# Patient Record
Sex: Female | Born: 1960 | Race: White | Hispanic: No | Marital: Married | Smoking: Former smoker
Health system: Southern US, Community
[De-identification: ages and names within clinical notes are randomized; demographics above are authoritative.]

## PROBLEM LIST (undated history)

## (undated) DIAGNOSIS — K7460 Unspecified cirrhosis of liver: Secondary | ICD-10-CM

## (undated) DIAGNOSIS — F419 Anxiety disorder, unspecified: Secondary | ICD-10-CM

## (undated) DIAGNOSIS — E079 Disorder of thyroid, unspecified: Secondary | ICD-10-CM

## (undated) DIAGNOSIS — I509 Heart failure, unspecified: Secondary | ICD-10-CM

## (undated) DIAGNOSIS — R609 Edema, unspecified: Secondary | ICD-10-CM

## (undated) DIAGNOSIS — R011 Cardiac murmur, unspecified: Secondary | ICD-10-CM

## (undated) DIAGNOSIS — I350 Nonrheumatic aortic (valve) stenosis: Secondary | ICD-10-CM

## (undated) DIAGNOSIS — I10 Essential (primary) hypertension: Secondary | ICD-10-CM

## (undated) DIAGNOSIS — D649 Anemia, unspecified: Secondary | ICD-10-CM

## (undated) DIAGNOSIS — G4730 Sleep apnea, unspecified: Secondary | ICD-10-CM

## (undated) HISTORY — DX: Heart failure, unspecified: I50.9

## (undated) HISTORY — PX: BREAST BIOPSY: SHX20

## (undated) HISTORY — DX: Cardiac murmur, unspecified: R01.1

## (undated) HISTORY — DX: Disorder of thyroid, unspecified: E07.9

## (undated) HISTORY — PX: TUBAL LIGATION: SHX77

## (undated) HISTORY — PX: DILATION AND CURETTAGE OF UTERUS: SHX78

## (undated) HISTORY — DX: Nonrheumatic aortic (valve) stenosis: I35.0

## (undated) HISTORY — DX: Edema, unspecified: R60.9

## (undated) HISTORY — DX: Anemia, unspecified: D64.9

## (undated) HISTORY — DX: Essential (primary) hypertension: I10

## (undated) HISTORY — DX: Unspecified cirrhosis of liver: K74.60

## (undated) HISTORY — PX: ELBOW SURGERY: SHX618

---

## 1993-12-17 HISTORY — PX: POLYPECTOMY: SHX149

## 2001-12-17 HISTORY — PX: CHOLECYSTECTOMY: SHX55

## 2006-07-05 DIAGNOSIS — F411 Generalized anxiety disorder: Secondary | ICD-10-CM | POA: Insufficient documentation

## 2006-07-05 DIAGNOSIS — I10 Essential (primary) hypertension: Secondary | ICD-10-CM | POA: Insufficient documentation

## 2007-11-26 ENCOUNTER — Emergency Department: Payer: Self-pay | Admitting: Emergency Medicine

## 2008-02-03 ENCOUNTER — Ambulatory Visit: Payer: Self-pay | Admitting: Family Medicine

## 2008-06-24 ENCOUNTER — Ambulatory Visit: Payer: Self-pay | Admitting: Family Medicine

## 2010-01-02 ENCOUNTER — Emergency Department: Payer: Self-pay | Admitting: Emergency Medicine

## 2010-12-02 ENCOUNTER — Ambulatory Visit: Payer: Self-pay | Admitting: Family Medicine

## 2010-12-29 ENCOUNTER — Ambulatory Visit: Payer: Self-pay | Admitting: Family Medicine

## 2011-02-23 ENCOUNTER — Ambulatory Visit: Payer: Self-pay

## 2011-03-27 ENCOUNTER — Encounter: Payer: Self-pay | Admitting: Cardiovascular Disease

## 2011-03-27 ENCOUNTER — Ambulatory Visit (INDEPENDENT_AMBULATORY_CARE_PROVIDER_SITE_OTHER): Payer: Managed Care, Other (non HMO) | Admitting: Cardiovascular Disease

## 2011-03-27 DIAGNOSIS — R0602 Shortness of breath: Secondary | ICD-10-CM | POA: Insufficient documentation

## 2011-03-27 DIAGNOSIS — R609 Edema, unspecified: Secondary | ICD-10-CM | POA: Insufficient documentation

## 2011-03-27 DIAGNOSIS — E119 Type 2 diabetes mellitus without complications: Secondary | ICD-10-CM

## 2011-03-27 DIAGNOSIS — I10 Essential (primary) hypertension: Secondary | ICD-10-CM | POA: Insufficient documentation

## 2011-03-27 MED ORDER — LISINOPRIL-HYDROCHLOROTHIAZIDE 20-12.5 MG PO TABS: 2.0000 | ORAL_TABLET | Freq: Every day | ORAL | Status: DC

## 2011-03-27 MED ORDER — METOPROLOL TARTRATE 25 MG PO TABS: 25.0000 mg | ORAL_TABLET | Freq: Two times a day (BID) | ORAL | Status: DC

## 2011-03-27 NOTE — Assessment & Plan Note (Signed)
As we will stop her amlodipine, I suggested that we increase her lisinopril to 40/25 mg daily. We will also start metoprolol tartrate 25 mg b.i.d.. Ideally I would've like to try bystolic though this can sometimes cause edema and fluid retention. If her pressure continues to be elevated, we could try additional medications such as clonidine, hydralazine or nitrates.

## 2011-03-27 NOTE — Patient Instructions (Signed)
STOP Amlodipine. START Metoprolol Tartrate 25 mg twice daily. Increase Lisinopril/HCT to 40/25mg  once daily. (You can take 2 tablets of what you have now, but if you take Lasix, only take one tablet).  Your physician has requested that you have an echocardiogram. Echocardiography is a painless test that uses sound waves to create images of your heart. It provides your doctor with information about the size and shape of your heart and how well your heart's chambers and valves are working. This procedure takes approximately one hour. There are no restrictions for this procedure.  Your physician recommends that you schedule a follow-up appointment in: 5 weeks

## 2011-03-27 NOTE — Progress Notes (Signed)
Patient ID: Emily Tapia, female    DOB: Jul 12, 1961, 50 y.o.   MRN: [id number]  HPI Comments: [name] is a very pleasant 50 year old woman who presents by her fall from Dr. Aldine Contes for evaluation of murmur, shortness of breath and edema.  She reports that she has had a long history of shortness of breath for 1-1/2 years. She reports that it came on quickly. She has started Lasix in the past month and has had a mild weight loss with improvement of her shortness of breath. She reports that the murmur that she has has been there for several years though reports that it might have been getting louder recently.   She started having lower extremity swelling about one year ago. It has been worse over the past 6 months. When she takes Lasix, this seems to help to a mild degree. Her feet are very sensitive to touch bilaterally. She sits most of the day but has to put her feet up on a box for relief. She denies any significant chest discomfort. She does not do any regular exercise was able to ablate without any significant difficulty and perform all of her ADLs.  She reports that her blood pressure has been elevated at home with systolic pressures in the 150 range. She does have a elevated heart rate at baseline and also has noticed palpitations.  EKG shows normal sinus rhythm with rate 86 beats per minute with no significant ST or T wave changes     Review of Systems  Constitutional: Negative.   HENT: Negative.   Eyes: Negative.   Respiratory: Positive for shortness of breath.   Cardiovascular: Positive for palpitations and leg swelling.  Gastrointestinal: Negative.   Musculoskeletal: Negative.   Skin: Negative.   Neurological: Negative.   Hematological: Negative.   Psychiatric/Behavioral: Negative.   All other systems reviewed and are negative.   BP 144/68  Pulse 86  Ht 5\' 4"  (1.626 m)  Wt 224 lb 1.9 oz (101.66 kg)  BMI 38.47 kg/m2  Physical Exam  Nursing note and  vitals reviewed. Constitutional: She is oriented to person, place, and time. She appears well-developed and well-nourished.       Obese.  HENT:  Head: Normocephalic.  Nose: Nose normal.  Mouth/Throat: Oropharynx is clear and moist.  Eyes: Conjunctivae are normal. Pupils are equal, round, and reactive to light.  Neck: Normal range of motion. Neck supple. No JVD present.  Cardiovascular: Normal rate, regular rhythm, S1 normal, S2 normal and intact distal pulses.  Exam reveals no gallop and no friction rub.   Murmur heard.  Crescendo systolic murmur is present with a grade of 3/6  Pulses:      Carotid pulses are 1+ on the right side with bruit, and 1+ on the left side with bruit. Pulmonary/Chest: Effort normal and breath sounds normal. No respiratory distress. She has no wheezes. She has no rales. She exhibits no tenderness.  Abdominal: Soft. Bowel sounds are normal. She exhibits no distension. There is no tenderness.  Musculoskeletal: Normal range of motion. She exhibits edema. She exhibits no tenderness.       Edema of the legs is nonpitting, trace to the ankles.  Lymphadenopathy:    She has no cervical adenopathy.  Neurological: She is alert and oriented to person, place, and time. Coordination normal.  Skin: Skin is warm and dry. No rash noted. No erythema.  Psychiatric: She has a normal mood and affect. Her behavior is normal. Judgment and thought  content normal.         Assessment and Plan

## 2011-03-27 NOTE — Assessment & Plan Note (Signed)
I am concerned that her edema could be an exacerbation of the amlodipine side effect and underlying venous insufficiency. I suggested that she stop her amlodipine for now.

## 2011-03-27 NOTE — Assessment & Plan Note (Signed)
I suspect her shortness of breath could be secondary to underlying valvular disease. Her murmur does radiate and difficult to identify. We have ordered an echocardiogram given her symptoms of shortness of breath and murmur.

## 2011-03-27 NOTE — Assessment & Plan Note (Signed)
We have encouraged her to continue to work on her diabetes, weight loss and watching her diet with  increased exercise as tolerated

## 2011-04-05 ENCOUNTER — Ambulatory Visit (INDEPENDENT_AMBULATORY_CARE_PROVIDER_SITE_OTHER): Payer: Managed Care, Other (non HMO) | Admitting: *Deleted

## 2011-04-05 ENCOUNTER — Other Ambulatory Visit: Payer: Self-pay | Admitting: *Deleted

## 2011-04-05 ENCOUNTER — Other Ambulatory Visit: Payer: Self-pay | Admitting: Cardiovascular Disease

## 2011-04-05 DIAGNOSIS — R079 Chest pain, unspecified: Secondary | ICD-10-CM

## 2011-04-05 DIAGNOSIS — R609 Edema, unspecified: Secondary | ICD-10-CM

## 2011-04-05 DIAGNOSIS — R011 Cardiac murmur, unspecified: Secondary | ICD-10-CM

## 2011-04-05 DIAGNOSIS — R0602 Shortness of breath: Secondary | ICD-10-CM

## 2011-04-10 ENCOUNTER — Encounter: Payer: Self-pay | Admitting: Cardiovascular Disease

## 2011-05-01 ENCOUNTER — Ambulatory Visit (INDEPENDENT_AMBULATORY_CARE_PROVIDER_SITE_OTHER): Payer: Managed Care, Other (non HMO) | Admitting: Cardiovascular Disease

## 2011-05-01 ENCOUNTER — Encounter: Payer: Self-pay | Admitting: Cardiovascular Disease

## 2011-05-01 DIAGNOSIS — I359 Nonrheumatic aortic valve disorder, unspecified: Secondary | ICD-10-CM

## 2011-05-01 DIAGNOSIS — I10 Essential (primary) hypertension: Secondary | ICD-10-CM

## 2011-05-01 DIAGNOSIS — R609 Edema, unspecified: Secondary | ICD-10-CM

## 2011-05-01 DIAGNOSIS — R0602 Shortness of breath: Secondary | ICD-10-CM

## 2011-05-01 DIAGNOSIS — I350 Nonrheumatic aortic (valve) stenosis: Secondary | ICD-10-CM

## 2011-05-01 NOTE — Assessment & Plan Note (Signed)
Echocardiogram is normal. I asked her to continue to use her Lasix p.r.n. Increase her exercise and watch her weight.

## 2011-05-01 NOTE — Progress Notes (Signed)
   Patient ID: Emily Tapia, female    DOB: 1961/09/03, 50 y.o.   MRN: [id number]  HPI Comments: [name] is a very pleasant 50 year old woman, patient of  Aldine Contes for evaluation of murmur, shortness of breath and edema.  She reports that her breathing has been stable. On her last visit, he started metoprolol for tachycardia, held her amlodipine and started an ACE inhibitor. Her blood pressure has been improved. She does take a Lasix as needed and has improved lower extremity, overall she has no complaints.  The echocardiogram showed normal systolic function, mild LVH, with borderline to mild aortic valve stenosis with normal right ventricular systolic pressures  EKG shows normal sinus rhythm with rate 72 beats per minute with no significant ST or T wave changes     Review of Systems  Constitutional: Negative.   HENT: Negative.   Eyes: Negative.   Respiratory: Negative.   Cardiovascular: Negative.   Gastrointestinal: Negative.   Musculoskeletal: Negative.   Skin: Negative.   Neurological: Negative.   Hematological: Negative.   Psychiatric/Behavioral: Negative.   All other systems reviewed and are negative.    BP 134/64  Pulse 73  Ht 5\' 3"  (1.6 m)  Wt 224 lb (101.606 kg)  BMI 39.68 kg/m2   Physical Exam  Nursing note and vitals reviewed. Constitutional: She is oriented to person, place, and time. She appears well-developed and well-nourished.  HENT:  Head: Normocephalic.  Nose: Nose normal.  Mouth/Throat: Oropharynx is clear and moist.  Eyes: Conjunctivae are normal. Pupils are equal, round, and reactive to light.  Neck: Normal range of motion. Neck supple. No JVD present.  Cardiovascular: Normal rate, regular rhythm, S1 normal, S2 normal and intact distal pulses.  Exam reveals no gallop and no friction rub.   Murmur heard.  Systolic murmur is present with a grade of 2/6  Pulmonary/Chest: Effort normal and breath sounds normal. No respiratory  distress. She has no wheezes. She has no rales. She exhibits no tenderness.  Abdominal: Soft. Bowel sounds are normal. She exhibits no distension. There is no tenderness.  Musculoskeletal: Normal range of motion. She exhibits no edema and no tenderness.  Lymphadenopathy:    She has no cervical adenopathy.  Neurological: She is alert and oriented to person, place, and time. Coordination normal.  Skin: Skin is warm and dry. No rash noted. No erythema.  Psychiatric: She has a normal mood and affect. Her behavior is normal. Judgment and thought content normal.         Assessment and Plan

## 2011-05-01 NOTE — Assessment & Plan Note (Signed)
Edema has improved by holding amlodipine and with p.r.n. Lasix. We have asked her to wear tight compression hose if her symptoms get worse. Echocardiogram does not suggest fluid overload.

## 2011-05-01 NOTE — Patient Instructions (Signed)
You are doing well. No medication changes were made. Please call us if you have new issues that need to be addressed before your next appt.  We will call you for a follow up Appt. 1 year

## 2011-05-01 NOTE — Assessment & Plan Note (Signed)
Blood pressure is well controlled on today's visit. No changes made to the medications. 

## 2011-05-01 NOTE — Assessment & Plan Note (Signed)
She does have very mild aortic valve stenosis. This can be managed medically. No followup echocardiogram need for several years.

## 2013-10-25 DIAGNOSIS — D259 Leiomyoma of uterus, unspecified: Secondary | ICD-10-CM | POA: Insufficient documentation

## 2013-11-17 HISTORY — PX: ABDOMINAL HYSTERECTOMY: SHX81

## 2014-07-23 ENCOUNTER — Ambulatory Visit: Payer: Self-pay | Admitting: Family Medicine

## 2014-09-03 LAB — HEPATIC FUNCTION PANEL
ALK PHOS: 71 U/L (ref 25–125)
ALT: 53 U/L — AB (ref 7–35)
AST: 41 U/L — AB (ref 13–35)
BILIRUBIN, TOTAL: 0.4 mg/dL

## 2014-09-03 LAB — CBC AND DIFFERENTIAL
HEMATOCRIT: 36 % (ref 36–46)
Hemoglobin: 11.9 g/dL — AB (ref 12.0–16.0)
NEUTROS ABS: 63 /uL
PLATELETS: 202 10*3/uL (ref 150–399)
WBC: 6.5 10^3/mL

## 2014-09-03 LAB — BASIC METABOLIC PANEL
BUN: 14 mg/dL (ref 4–21)
CREATININE: 0.6 mg/dL (ref 0.5–1.1)
Glucose: 93 mg/dL

## 2014-09-03 LAB — LIPID PANEL
Cholesterol: 181 mg/dL (ref 0–200)
HDL: 36 mg/dL (ref 35–70)
LDL CALC: 101 mg/dL
Triglycerides: 221 mg/dL — AB (ref 40–160)

## 2014-09-03 LAB — HEMOGLOBIN A1C: Hgb A1c MFr Bld: 6.2 % — AB (ref 4.0–6.0)

## 2014-09-03 LAB — TSH: TSH: 3.35 u[IU]/mL (ref 0.41–5.90)

## 2015-04-27 DIAGNOSIS — R609 Edema, unspecified: Secondary | ICD-10-CM | POA: Insufficient documentation

## 2015-04-27 DIAGNOSIS — E039 Hypothyroidism, unspecified: Secondary | ICD-10-CM | POA: Insufficient documentation

## 2015-04-27 DIAGNOSIS — M25519 Pain in unspecified shoulder: Secondary | ICD-10-CM | POA: Insufficient documentation

## 2015-04-27 DIAGNOSIS — G4733 Obstructive sleep apnea (adult) (pediatric): Secondary | ICD-10-CM | POA: Insufficient documentation

## 2015-04-27 DIAGNOSIS — D649 Anemia, unspecified: Secondary | ICD-10-CM | POA: Insufficient documentation

## 2015-04-27 DIAGNOSIS — R011 Cardiac murmur, unspecified: Secondary | ICD-10-CM | POA: Insufficient documentation

## 2015-04-27 DIAGNOSIS — Z332 Encounter for elective termination of pregnancy: Secondary | ICD-10-CM | POA: Insufficient documentation

## 2015-04-27 DIAGNOSIS — E876 Hypokalemia: Secondary | ICD-10-CM | POA: Insufficient documentation

## 2015-04-27 DIAGNOSIS — R0602 Shortness of breath: Secondary | ICD-10-CM | POA: Insufficient documentation

## 2015-04-27 DIAGNOSIS — G64 Other disorders of peripheral nervous system: Secondary | ICD-10-CM | POA: Insufficient documentation

## 2015-04-27 DIAGNOSIS — E119 Type 2 diabetes mellitus without complications: Secondary | ICD-10-CM | POA: Insufficient documentation

## 2015-04-27 DIAGNOSIS — E1142 Type 2 diabetes mellitus with diabetic polyneuropathy: Secondary | ICD-10-CM | POA: Insufficient documentation

## 2015-04-27 DIAGNOSIS — E785 Hyperlipidemia, unspecified: Secondary | ICD-10-CM | POA: Insufficient documentation

## 2015-06-07 ENCOUNTER — Other Ambulatory Visit: Payer: Self-pay

## 2015-06-07 MED ORDER — LEVOTHYROXINE SODIUM 50 MCG PO TABS: 50.0000 ug | ORAL_TABLET | Freq: Every day | ORAL | Status: DC

## 2015-06-07 NOTE — Telephone Encounter (Signed)
Prescription sent to [facility] on [address]. Should schedule follow up appointment.

## 2015-06-07 NOTE — Telephone Encounter (Signed)
Patient is requesting a refill of Levothyroxine 50 mcg be sent to [facility] on [address]

## 2015-06-08 NOTE — Telephone Encounter (Signed)
Left patient a voicemail informing her that the RX has been sent to the pharmacy and to call back to schedule a follow up appointment.

## 2015-07-08 ENCOUNTER — Encounter: Payer: Self-pay | Admitting: Family Medicine

## 2015-07-08 ENCOUNTER — Ambulatory Visit (INDEPENDENT_AMBULATORY_CARE_PROVIDER_SITE_OTHER): Payer: Managed Care, Other (non HMO) | Admitting: Family Medicine

## 2015-07-08 VITALS — BP 132/84 | HR 68 | Temp 98.8°F | Resp 18 | Ht 64.0 in | Wt 212.0 lb

## 2015-07-08 DIAGNOSIS — R1900 Intra-abdominal and pelvic swelling, mass and lump, unspecified site: Secondary | ICD-10-CM | POA: Insufficient documentation

## 2015-07-08 DIAGNOSIS — E1142 Type 2 diabetes mellitus with diabetic polyneuropathy: Secondary | ICD-10-CM | POA: Diagnosis not present

## 2015-07-08 DIAGNOSIS — I10 Essential (primary) hypertension: Secondary | ICD-10-CM | POA: Diagnosis not present

## 2015-07-08 DIAGNOSIS — I350 Nonrheumatic aortic (valve) stenosis: Secondary | ICD-10-CM | POA: Diagnosis not present

## 2015-07-08 DIAGNOSIS — G629 Polyneuropathy, unspecified: Secondary | ICD-10-CM | POA: Diagnosis not present

## 2015-07-08 DIAGNOSIS — E782 Mixed hyperlipidemia: Secondary | ICD-10-CM | POA: Diagnosis not present

## 2015-07-08 DIAGNOSIS — Z8639 Personal history of other endocrine, nutritional and metabolic disease: Secondary | ICD-10-CM | POA: Insufficient documentation

## 2015-07-08 DIAGNOSIS — E039 Hypothyroidism, unspecified: Secondary | ICD-10-CM | POA: Diagnosis not present

## 2015-07-08 MED ORDER — PRAVASTATIN SODIUM 40 MG PO TABS: 40.0000 mg | ORAL_TABLET | Freq: Every day | ORAL | Status: DC

## 2015-07-08 NOTE — Progress Notes (Signed)
Patient ID: Emily Tapia, female   DOB: [birth year], 54 y.o.   MRN: [id number]       Patient: Emily Tapia Female    DOB: 11-28-1961   54 y.o.   MRN: [id number] Visit Date: 07/08/2015  Today's Provider: Vernie Murders, PA   Chief Complaint  Patient presents with  . Hypertension  . Diabetes  . Hypothyroidism  . Hyperlipidemia   Subjective:    HPI  Hypertension follow up:  Patient was last seen for a routine follow up on 09/03/2014. At that time B/P was controlled well. She is not checking her B/P. She denies having any cardiac issues.  Diabetes follow up:    Last A1C was 6.2 on 09/03/14-no changes were made at that time. She checks occasionally fasting around 118. No hypoglycemic. She does check her feet for any ulcers or wounds. Her last eye exam was over a year ago-she does not have on scheduled yet.  Hypothyroidism follow up:  Last check was on 09/03/14 and levels were stable.  Hyperlipidemia follow up:   Last check was on 09/03/14-triglycerides were elevated and slight liver changes. Advised to work on habits and follow up in 3 months. She does exercise- elliptical at work twice a day every day. She tries to watch her diet.  History of iron deficiency:  On her last visit routine labs were checked and MCV, MCH were slightly down on blood work.  Past Medical History  Diagnosis Date  . CHF (congestive heart failure)   . Edema   . Heart murmur   . Hypertension   . Thyroid disease   . Diabetes mellitus    Past Surgical History  Procedure Laterality Date  . Cesarean section      x 2  . Elbow surgery    . Breast biopsy      x2  . Dilation and curettage of uterus    . Abdominal hysterectomy  11/17/2013  . Cholecystectomy  2003    DUE TO STONES  . Polypectomy  1995   Family History  Problem Relation Age of Onset  . Hyperlipidemia Mother   . Thyroid disease Mother   . Hashimoto's thyroiditis Mother   . Heart disease Father     open heart surgery  .  Heart failure Father   . Diabetes Father   . Dementia Father   . Thyroid disease Brother   . Breast cancer Maternal Grandmother   . Diabetes Paternal Grandmother   . Prostate cancer Paternal Grandfather       No Known Allergies   Previous Medications   ASPIRIN 81 MG EC TABLET    Take 81 mg by mouth daily.     CHOLECALCIFEROL (VITAMIN D3) 1000 UNITS CAPS    Take 1 capsule by mouth daily.     CLONAZEPAM (KLONOPIN) 1 MG TABLET    Take 1 mg by mouth 2 (two) times daily as needed.     FERROUS SULFATE (IRON) 325 (65 FE) MG TABS    Take 1 tablet by mouth daily.     FISH OIL-OMEGA-3 FATTY ACIDS 1000 MG CAPSULE    Take 2 g by mouth daily.     FUROSEMIDE (LASIX) 40 MG TABLET    Take 40 mg by mouth daily.     GLYBURIDE (DIABETA) 5 MG TABLET    Take 5 mg by mouth 2 (two) times daily with a meal.     LEVOTHYROXINE (SYNTHROID, LEVOTHROID) 50 MCG TABLET    Take 1 tablet (  50 mcg total) by mouth daily.   LIRAGLUTIDE 18 MG/3ML SOPN    Inject 1.2 mg into the skin daily.   LISINOPRIL-HYDROCHLOROTHIAZIDE (ZESTORETIC) 20-12.5 MG PER TABLET    Take 2 tablets by mouth daily.   METFORMIN (GLUMETZA) 1000 MG (MOD) 24 HR TABLET    Take 1,000 mg by mouth 2 (two) times daily with a meal.     METOPROLOL TARTRATE (LOPRESSOR) 25 MG TABLET    Take 1 tablet (25 mg total) by mouth 2 (two) times daily.   MULTIPLE VITAMIN (MULTIVITAMIN) TABLET    Take 1 tablet by mouth daily.     PAROXETINE (PAXIL) 20 MG TABLET    Take 20 mg by mouth every morning.     PRAVASTATIN (PRAVACHOL) 40 MG TABLET    Take 40 mg by mouth daily.      Review of Systems  Constitutional: Positive for fatigue (about the same). Negative for chills, activity change and appetite change.  Respiratory: Positive for shortness of breath (at times when she works out) and wheezing. Negative for apnea, cough, choking and chest tightness.   Cardiovascular: Positive for palpitations (chronic not worse) and leg swelling (chronic). Negative for chest pain.    Gastrointestinal: Positive for diarrhea (stool incontinence lately).  Genitourinary: Positive for urgency (urinary incontinence present).  Musculoskeletal: Positive for joint swelling.  Neurological: Negative.        Tingling and pins&needles sensation dorsum of feet and toes unchanged    History  Substance Use Topics  . Smoking status: Former Smoker -- 1.50 packs/day for 25 years    Types: Cigarettes    Quit date: 11/24/2001  . Smokeless tobacco: Never Used  . Alcohol Use: 0.0 oz/week    0 Standard drinks or equivalent per week     Comment: OCCASIONALLY DRINKS WINE, ONCE A WEEK   Objective:   BP 132/84 mmHg  Pulse 68  Temp(Src) 98.8 F (37.1 C)  Resp 18  Ht 5\' 4"  (1.626 m)  Wt 212 lb (96.163 kg)  BMI 36.37 kg/m2  Physical Exam  Constitutional: She is oriented to person, place, and time. She appears well-developed and well-nourished.  HENT:  Head: Normocephalic and atraumatic.  Right Ear: External ear normal.  Left Ear: External ear normal.  Nose: Nose normal.  Mouth/Throat: Oropharynx is clear and moist.  Eyes: Conjunctivae and EOM are normal. Pupils are equal, round, and reactive to light.  Neck: Normal range of motion. Neck supple. No thyromegaly present.  Cardiovascular: Normal rate, regular rhythm and intact distal pulses.   Murmur heard.  Systolic murmur is present with a grade of 2/6  Right 2nd ICS at  [facility]  Pulmonary/Chest: Effort normal and breath sounds normal.  Abdominal: Soft. Bowel sounds are normal.  Musculoskeletal: Normal range of motion.  Neurological: She is alert and oriented to person, place, and time. She has normal reflexes.  Normal sensation of both feet to test with nylon string  Psychiatric: She has a normal mood and affect. Her behavior is normal. Thought content normal.      Assessment & Plan:     1. Essential hypertension Good control with regular use of Metoprolol 25 mg qd and Lisinopril-HCTZ 20-12.5 mg qd. Will get follow up labs and  continue present regimen. Recheck pending reports. - Comprehensive metabolic panel - CBC with Differential/Platelet  2. Diabetic peripheral neuropathy associated with type 2 diabetes mellitus Tolerating Metformin 1000 mg BID and Glyburide 5 mg BID with Victoza 1.2 mg injected daily. Blood sugar around 118  at home Hgb A1C 6.7 today. Good compliance. Still has some pins&needles sensation in the dorsum of her feet and toes. Normal nylon string test. Will get routine labs and microalbumin. Last eye exam more than a year ago and will schedule soon. Recheck in 3 months. - POCT HgB A1C - POCT UA - Microalbumin  3. Hypothyroidism, unspecified hypothyroidism type Still has some heat intolerance and taking Levothyroxine 50 mcg qd. Recheck CMP and TSH. Follow up pending reports - TSH - Comprehensive metabolic panel  4. Hyperlipidemia, mixed Good compliance with Fish Oil and Pravastatin 40 mg qd. Continue low fat diet and work on weight loss. Refilled Pravastatin and recheck pending lab reports. - Lipid panel - pravastatin (PRAVACHOL) 40 MG tablet; Take 1 tablet (40 mg total) by mouth daily.  Dispense: 30 tablet; Refill: 6  5. Hx of iron deficiency Due for follow up and continues to use Ferrous Sulfate 325 mg qd with Multivitamin. Recheck hgb and hct. - CBC with Differential/Platelet  6. Aortic valve stenosis Murmur unchanged. Still taking Metoprolol and Furosemide prn as recommended by her cardiologist (Dr. Rockey Situ). Follow up with him as planned.         Vernie Murders, PA  [facility]

## 2015-07-13 LAB — CBC WITH DIFFERENTIAL/PLATELET
BASOS ABS: 0 10*3/uL (ref 0.0–0.2)
Basos: 1 %
EOS (ABSOLUTE): 0.1 10*3/uL (ref 0.0–0.4)
EOS: 2 %
HEMOGLOBIN: 14 g/dL (ref 11.1–15.9)
Hematocrit: 39.2 % (ref 34.0–46.6)
Immature Grans (Abs): 0 10*3/uL (ref 0.0–0.1)
Immature Granulocytes: 0 %
Lymphocytes Absolute: 1.6 10*3/uL (ref 0.7–3.1)
Lymphs: 26 %
MCH: 27.8 pg (ref 26.6–33.0)
MCHC: 35.7 g/dL (ref 31.5–35.7)
MCV: 78 fL — AB (ref 79–97)
Monocytes Absolute: 0.4 10*3/uL (ref 0.1–0.9)
Monocytes: 6 %
NEUTROS PCT: 65 %
Neutrophils Absolute: 3.9 10*3/uL (ref 1.4–7.0)
Platelets: 167 10*3/uL (ref 150–379)
RBC: 5.04 x10E6/uL (ref 3.77–5.28)
RDW: 15.9 % — ABNORMAL HIGH (ref 12.3–15.4)
WBC: 6.1 10*3/uL (ref 3.4–10.8)

## 2015-07-13 LAB — COMPREHENSIVE METABOLIC PANEL
A/G RATIO: 1.7 (ref 1.1–2.5)
ALT: 74 IU/L — ABNORMAL HIGH (ref 0–32)
AST: 73 IU/L — AB (ref 0–40)
Albumin: 4.2 g/dL (ref 3.5–5.5)
Alkaline Phosphatase: 73 IU/L (ref 39–117)
BILIRUBIN TOTAL: 0.4 mg/dL (ref 0.0–1.2)
BUN / CREAT RATIO: 25 — AB (ref 9–23)
BUN: 15 mg/dL (ref 6–24)
CO2: 27 mmol/L (ref 18–29)
Calcium: 9.3 mg/dL (ref 8.7–10.2)
Chloride: 97 mmol/L (ref 97–108)
Creatinine, Ser: 0.59 mg/dL (ref 0.57–1.00)
GFR calc Af Amer: 119 mL/min/{1.73_m2} (ref >59)
GFR calc non Af Amer: 104 mL/min/{1.73_m2} (ref >59)
GLUCOSE: 211 mg/dL — AB (ref 65–99)
Globulin, Total: 2.5 g/dL (ref 1.5–4.5)
POTASSIUM: 4.5 mmol/L (ref 3.5–5.2)
Sodium: 141 mmol/L (ref 134–144)
TOTAL PROTEIN: 6.7 g/dL (ref 6.0–8.5)

## 2015-07-13 LAB — LIPID PANEL
Chol/HDL Ratio: 5.8 ratio units — ABNORMAL HIGH (ref 0.0–4.4)
Cholesterol, Total: 185 mg/dL (ref 100–199)
HDL: 32 mg/dL — AB (ref >39)
LDL CALC: 115 mg/dL — AB (ref 0–99)
TRIGLYCERIDES: 190 mg/dL — AB (ref 0–149)
VLDL Cholesterol Cal: 38 mg/dL (ref 5–40)

## 2015-07-13 LAB — TSH: TSH: 4.45 u[IU]/mL (ref 0.450–4.500)

## 2015-07-15 ENCOUNTER — Telehealth: Payer: Self-pay

## 2015-07-15 NOTE — Telephone Encounter (Signed)
-----   Message from Margo Common, [place] sent at 07/15/2015  1:55 PM EDT ----- Thyroid test normal on present dosage of the Levothyroxine. Blood sugar up to 211 and Hgb A1C 6.7. Continue present medications and diet with weight loss efforts. Cholesterol improved. Continue present Pravastatin 40 mg qd. Recheck in 3 months.

## 2015-07-15 NOTE — Telephone Encounter (Signed)
Patient advised as directed below. Patient verbalized understanding and agrees with treatment plan. 

## 2015-07-15 NOTE — Telephone Encounter (Signed)
LMTCB

## 2015-07-15 NOTE — Telephone Encounter (Signed)
Pt returned call. Thanks TNP °

## 2015-08-29 ENCOUNTER — Other Ambulatory Visit: Payer: Self-pay | Admitting: Family Medicine

## 2015-08-29 NOTE — Telephone Encounter (Signed)
Patient e-mail received, patient is requesting a refill on Paroxetine 20 mg. [place].

## 2015-10-12 ENCOUNTER — Telehealth: Payer: Self-pay

## 2015-10-12 NOTE — Telephone Encounter (Signed)
Patient wanted to know if the paperwork she dropped off on Monday is ready for pick up? Patient states she will need this done by Friday if possible. Please advise.

## 2015-10-13 NOTE — Telephone Encounter (Signed)
LMTCB

## 2015-10-13 NOTE — Telephone Encounter (Signed)
Pt is returning call.  CB#[phone]/MW °

## 2015-10-24 ENCOUNTER — Other Ambulatory Visit: Payer: Self-pay | Admitting: Family Medicine

## 2015-10-24 ENCOUNTER — Other Ambulatory Visit: Payer: Self-pay

## 2015-10-24 DIAGNOSIS — I10 Essential (primary) hypertension: Secondary | ICD-10-CM

## 2015-10-24 MED ORDER — METOPROLOL TARTRATE 25 MG PO TABS: 25.0000 mg | ORAL_TABLET | Freq: Two times a day (BID) | ORAL | Status: DC

## 2015-10-24 NOTE — Telephone Encounter (Signed)
Patient email received requesting a refill on Metoprolol 25 mg be sent to [place].

## 2015-10-25 NOTE — Telephone Encounter (Signed)
[facility] sent RX to pharmacy.  

## 2015-11-11 ENCOUNTER — Other Ambulatory Visit: Payer: Self-pay | Admitting: Family Medicine

## 2015-11-15 ENCOUNTER — Other Ambulatory Visit: Payer: Self-pay | Admitting: Family Medicine

## 2015-11-17 ENCOUNTER — Telehealth: Payer: Self-pay | Admitting: Family Medicine

## 2015-11-17 NOTE — Telephone Encounter (Signed)
Pt stated that she has been trying to get [facility] to send her Valentine 32 MG/3ML SOPN but they are requiring an MD's signature on this RX. Pt stated that [facility] advised her that they faxed our office on 11/16/15 but they haven't heard back. Pt stated she understands that there is a turn around time but she just wanted to make sure it is being worked on because she doesn't want to run out. Thanks TNP

## 2015-11-17 NOTE — Telephone Encounter (Signed)
RX was e scribed to [facility] on 11/28. Contacted [facility] pharmacist stated RX has been processed, and is about to be shipped to patient.  Left patient a voicemail advising her that RX has been sent.

## 2015-12-06 ENCOUNTER — Encounter: Payer: Self-pay | Admitting: Family Medicine

## 2015-12-16 ENCOUNTER — Ambulatory Visit (INDEPENDENT_AMBULATORY_CARE_PROVIDER_SITE_OTHER): Payer: Managed Care, Other (non HMO) | Admitting: Physician Assistant

## 2015-12-16 ENCOUNTER — Encounter: Payer: Self-pay | Admitting: Physician Assistant

## 2015-12-16 VITALS — BP 126/84 | HR 78 | Temp 98.5°F | Resp 16 | Wt 213.0 lb

## 2015-12-16 DIAGNOSIS — L089 Local infection of the skin and subcutaneous tissue, unspecified: Secondary | ICD-10-CM

## 2015-12-16 MED ORDER — CEPHALEXIN 500 MG PO CAPS: 500.0000 mg | ORAL_CAPSULE | Freq: Two times a day (BID) | ORAL | Status: DC

## 2015-12-16 NOTE — Progress Notes (Signed)
Patient ID: Emily Tapia, female   DOB: 04-24-1961, 54 y.o.   MRN: [id number]       Patient: Emily Tapia Female    DOB: Jul 25, 1961   54 y.o.   MRN: [id number] Visit Date: 12/16/2015  Today's Provider: Mar Daring, PA-C   Chief Complaint  Patient presents with  . Toe Pain   Subjective:    HPI  Patient developed right middle toe pain for about 1 week now. She does not recall injuring it, did not cut her toe nails, just happened all of a sudden. Toe is red and painful, swelling has been present. Denies any discharge or pus. She has been treating her toe with peroxide and some antibiotic ointment and states toe seems to get better but the next day starts to look worse. No fever, no chills.    No Known Allergies Previous Medications   ASPIRIN 81 MG EC TABLET    Take 81 mg by mouth daily.     CHOLECALCIFEROL (VITAMIN D3) 1000 UNITS CAPS    Take 1 capsule by mouth daily.     CLONAZEPAM (KLONOPIN) 1 MG TABLET    Take 1 mg by mouth 2 (two) times daily as needed.     FERROUS SULFATE (IRON) 325 (65 FE) MG TABS    Take 1 tablet by mouth daily.     FISH OIL-OMEGA-3 FATTY ACIDS 1000 MG CAPSULE    Take 2 g by mouth daily.     FUROSEMIDE (LASIX) 40 MG TABLET    Take 40 mg by mouth daily.     GLYBURIDE (DIABETA) 5 MG TABLET    Take 5 mg by mouth 2 (two) times daily with a meal.     LEVOTHYROXINE (SYNTHROID, LEVOTHROID) 50 MCG TABLET    Take 1 tablet (50 mcg total) by mouth daily.   LISINOPRIL-HYDROCHLOROTHIAZIDE (ZESTORETIC) 20-12.5 MG PER TABLET    Take 2 tablets by mouth daily.   METFORMIN (GLUMETZA) 1000 MG (MOD) 24 HR TABLET    Take 1,000 mg by mouth 2 (two) times daily with a meal.     METOPROLOL TARTRATE (LOPRESSOR) 25 MG TABLET    Take 1 tablet (25 mg total) by mouth 2 (two) times daily.   MULTIPLE VITAMIN (MULTIVITAMIN) TABLET    Take 1 tablet by mouth daily.     PAROXETINE (PAXIL) 20 MG TABLET    TAKE ONE TABLET BY MOUTH ONCE DAILY   PRAVASTATIN (PRAVACHOL) 40 MG  TABLET    Take 1 tablet (40 mg total) by mouth daily.   VICTOZA 18 MG/3ML SOPN    Inject 1.2mg  subcutaneously daily    Review of Systems  Constitutional: Negative.   Respiratory: Negative.   Cardiovascular: Negative.   Musculoskeletal: Positive for joint swelling and arthralgias.  Skin: Positive for wound (right middle toe).    Social History  Substance Use Topics  . Smoking status: Former Smoker -- 1.50 packs/day for 25 years    Types: Cigarettes    Quit date: 11/24/2001  . Smokeless tobacco: Never Used  . Alcohol Use: 0.0 oz/week    0 Standard drinks or equivalent per week     Comment: OCCASIONALLY DRINKS WINE, ONCE A WEEK   Objective:   BP 126/84 mmHg  Pulse 78  Temp(Src) 98.5 F (36.9 C)  Resp 16  Wt 213 lb (96.616 kg)  Physical Exam  Constitutional: She appears well-developed and well-nourished. No distress.  Cardiovascular: Normal rate, regular rhythm and normal heart sounds.  Exam reveals no  gallop and no friction rub.   No murmur heard. Pulmonary/Chest: Effort normal and breath sounds normal. No respiratory distress. She has no wheezes. She has no rales.  Skin: Skin is warm and dry. Lesion noted. She is not diaphoretic. There is erythema.     Vitals reviewed.       Assessment & Plan:     1. Toe infection Small lesion over the PIP joint of the right middle toe. There is some mild erythema surrounding the lesion. She states it did come up as like a scab that she picked off. She does not remember any specific injury. I do question due to the appearance of the lesion if it may have been a spider/bug bite versus friction blister from a shoe that she did not notice due to her neuropathy. I will prescribe Keflex as below for treatment. I also discussed wound care with her. She is not to use hydrogen peroxide anymore. She voiced understanding. She is to call the office if symptoms fail to improve or worsen. - cephALEXin (KEFLEX) 500 MG capsule; Take 1 capsule (500 mg  total) by mouth 2 (two) times daily.  Dispense: 20 capsule; Refill: 0       Mar Daring, PA-C  [facility]

## 2015-12-16 NOTE — Patient Instructions (Signed)
Cephalexin tablets or capsules  What is this medicine?  CEPHALEXIN (sef a LEX in) is a cephalosporin antibiotic. It is used to treat certain kinds of bacterial infections It will not work for colds, flu, or other viral infections.  This medicine may be used for other purposes; ask your health care provider or pharmacist if you have questions.  What should I tell my health care provider before I take this medicine?  They need to know if you have any of these conditions:  -kidney disease  -stomach or intestine problems, especially colitis  -an unusual or allergic reaction to cephalexin, other cephalosporins, penicillins, other antibiotics, medicines, foods, dyes or preservatives  -pregnant or trying to get pregnant  -breast-feeding  How should I use this medicine?  Take this medicine by mouth with a full glass of water. Follow the directions on the prescription label. This medicine can be taken with or without food. Take your medicine at regular intervals. Do not take your medicine more often than directed. Take all of your medicine as directed even if you think you are better. Do not skip doses or stop your medicine early.  Talk to your pediatrician regarding the use of this medicine in children. While this drug may be prescribed for selected conditions, precautions do apply.  Overdosage: If you think you have taken too much of this medicine contact a poison control center or emergency room at once.  NOTE: This medicine is only for you. Do not share this medicine with others.  What if I miss a dose?  If you miss a dose, take it as soon as you can. If it is almost time for your next dose, take only that dose. Do not take double or extra doses. There should be at least 4 to 6 hours between doses.  What may interact with this medicine?  -probenecid  -some other antibiotics  This list may not describe all possible interactions. Give your health care provider a list of all the medicines, herbs, non-prescription drugs, or  dietary supplements you use. Also tell them if you smoke, drink alcohol, or use illegal drugs. Some items may interact with your medicine.  What should I watch for while using this medicine?  Tell your doctor or health care professional if your symptoms do not begin to improve in a few days.  Do not treat diarrhea with over the counter products. Contact your doctor if you have diarrhea that lasts more than 2 days or if it is severe and watery.  If you have diabetes, you may get a false-positive result for sugar in your urine. Check with your doctor or health care professional.  What side effects may I notice from receiving this medicine?  Side effects that you should report to your doctor or health care professional as soon as possible:  -allergic reactions like skin rash, itching or hives, swelling of the face, lips, or tongue  -breathing problems  -pain or trouble passing urine  -redness, blistering, peeling or loosening of the skin, including inside the mouth  -severe or watery diarrhea  -unusually weak or tired  -yellowing of the eyes, skin  Side effects that usually do not require medical attention (report to your doctor or health care professional if they continue or are bothersome):  -gas or heartburn  -genital or anal irritation  -headache  -joint or muscle pain  -nausea, vomiting  This list may not describe all possible side effects. Call your doctor for medical advice about side effects.   You may report side effects to FDA at 1-800-FDA-1088.  Where should I keep my medicine?  Keep out of the reach of children.  Store at room temperature between 59 and 86 degrees F (15 and 30 degrees C). Throw away any unused medicine after the expiration date.  NOTE: This sheet is a summary. It may not cover all possible information. If you have questions about this medicine, talk to your doctor, pharmacist, or health care provider.     © 2016, [employer]vier/[employer]. (2008-03-08 17:09:13)

## 2016-02-13 ENCOUNTER — Other Ambulatory Visit: Payer: Self-pay | Admitting: Family Medicine

## 2016-02-13 DIAGNOSIS — E782 Mixed hyperlipidemia: Secondary | ICD-10-CM

## 2016-02-13 DIAGNOSIS — I10 Essential (primary) hypertension: Secondary | ICD-10-CM

## 2016-02-13 DIAGNOSIS — E119 Type 2 diabetes mellitus without complications: Secondary | ICD-10-CM

## 2016-02-13 MED ORDER — PRAVASTATIN SODIUM 40 MG PO TABS: 40.0000 mg | ORAL_TABLET | Freq: Every day | ORAL | Status: DC

## 2016-02-13 MED ORDER — LISINOPRIL-HYDROCHLOROTHIAZIDE 20-12.5 MG PO TABS: 2.0000 | ORAL_TABLET | Freq: Every day | ORAL | Status: DC

## 2016-02-13 MED ORDER — GLYBURIDE 5 MG PO TABS: 5.0000 mg | ORAL_TABLET | Freq: Two times a day (BID) | ORAL | Status: DC

## 2016-02-16 ENCOUNTER — Telehealth: Payer: Self-pay | Admitting: Family Medicine

## 2016-02-16 NOTE — Telephone Encounter (Signed)
Pt stated that she was on lisinopril-hydrochlorothiazide (ZESTORETIC) 20-25 MG tablet once a day but when she picked up her medication it  lisinopril-hydrochlorothiazide (ZESTORETIC) 20-12.5 MG tablet twice a day. Pt wanted to know why the medication had changed or if it was an error. I advised pt that Simona Huh is out of the office this afternoon. Please advise. Thanks TNP

## 2016-02-17 ENCOUNTER — Other Ambulatory Visit: Payer: Self-pay | Admitting: Family Medicine

## 2016-02-17 MED ORDER — LISINOPRIL-HYDROCHLOROTHIAZIDE 20-25 MG PO TABS: 1.0000 | ORAL_TABLET | Freq: Every day | ORAL | Status: DC

## 2016-02-17 NOTE — Telephone Encounter (Signed)
Pt advised-aa 

## 2016-02-17 NOTE — Telephone Encounter (Signed)
Sent corrected prescription to the pharmacy. Should recheck BP and diabetes in the next month or two.

## 2016-02-17 NOTE — Telephone Encounter (Signed)
Please review-aa 

## 2016-02-24 ENCOUNTER — Ambulatory Visit: Payer: Self-pay | Admitting: Family Medicine

## 2016-02-24 ENCOUNTER — Encounter: Payer: Self-pay | Admitting: Family Medicine

## 2016-02-24 ENCOUNTER — Ambulatory Visit (INDEPENDENT_AMBULATORY_CARE_PROVIDER_SITE_OTHER): Payer: Managed Care, Other (non HMO) | Admitting: Family Medicine

## 2016-02-24 VITALS — BP 142/98 | HR 73 | Temp 98.0°F | Resp 16 | Wt 211.6 lb

## 2016-02-24 DIAGNOSIS — I350 Nonrheumatic aortic (valve) stenosis: Secondary | ICD-10-CM | POA: Diagnosis not present

## 2016-02-24 DIAGNOSIS — E782 Mixed hyperlipidemia: Secondary | ICD-10-CM | POA: Diagnosis not present

## 2016-02-24 DIAGNOSIS — I10 Essential (primary) hypertension: Secondary | ICD-10-CM | POA: Diagnosis not present

## 2016-02-24 DIAGNOSIS — E1142 Type 2 diabetes mellitus with diabetic polyneuropathy: Secondary | ICD-10-CM

## 2016-02-24 DIAGNOSIS — E039 Hypothyroidism, unspecified: Secondary | ICD-10-CM

## 2016-02-24 DIAGNOSIS — K589 Irritable bowel syndrome without diarrhea: Secondary | ICD-10-CM

## 2016-02-24 LAB — POCT UA - MICROALBUMIN: MICROALBUMIN (UR) POC: 50 mg/L

## 2016-02-24 NOTE — Progress Notes (Signed)
Patient ID: Emily Tapia, female   DOB: 1961-11-07, 55 y.o.   MRN: [id number]   Patient: Emily Tapia Female    DOB: 1961-08-05   55 y.o.   MRN: [id number] Visit Date: 02/24/2016  Today's Provider: Vernie Murders, PA   Chief Complaint  Patient presents with  . Hyperlipidemia  . Hypertension  . Diabetes  . Hypothyroidism  . Follow-up   Subjective:    HPI  Diabetes Mellitus Type II, Follow-up:   Lab Results  Component Value Date   HGBA1C 6.2* 09/03/2014   Last seen for diabetes 8 months ago.  Management since then includes none. She reports good compliance with treatment. She is not having side effects.  Current symptoms include none and have been stable. Home blood sugar records: fasting range: 200's  Episodes of hypoglycemia? no   Current Insulin Regimen: Victoza and Metformin Weight trend: stable Current diet: well balanced Current exercise: treadmill  ------------------------------------------------------------------------   Hypertension, follow-up:  BP Readings from Last 3 Encounters:  02/24/16 142/98  12/16/15 126/84  07/08/15 132/84    She was last seen for hypertension 8 months ago.  BP at that visit was 126/84. Management since that visit includes none .She reports good compliance with treatment. She is not having side effects.  She is exercising. She is adherent to low salt diet.   Outside blood pressures are not being checked. She is experiencing none.  Patient denies none.   Cardiovascular risk factors include diabetes mellitus and hypertension.  Use of agents associated with hypertension: none.   ------------------------------------------------------------------------    Lipid/Cholesterol, Follow-up:   Last seen for this 8 months ago.  Management since that visit includes none.  Last Lipid Panel:    Component Value Date/Time   CHOL 185 07/12/2015 0801   CHOL 181 09/03/2014   TRIG 190* 07/12/2015 0801   HDL 32*  07/12/2015 0801   HDL 36 09/03/2014   CHOLHDL 5.8* 07/12/2015 0801   LDLCALC 115* 07/12/2015 0801   LDLCALC 101 09/03/2014    She reports good compliance with treatment. She is not having side effects.   Wt Readings from Last 3 Encounters:  02/24/16 211 lb 9.6 oz (95.981 kg)  12/16/15 213 lb (96.616 kg)  07/08/15 212 lb (96.163 kg)    ------------------------------------------------------------------------     Previous Medications   ASPIRIN 81 MG EC TABLET    Take 81 mg by mouth daily.     CHOLECALCIFEROL (VITAMIN D3) 1000 UNITS CAPS    Take 1 capsule by mouth daily.     CLONAZEPAM (KLONOPIN) 1 MG TABLET    Take 1 mg by mouth 2 (two) times daily as needed.     FERROUS SULFATE (IRON) 325 (65 FE) MG TABS    Take 1 tablet by mouth daily.     FISH OIL-OMEGA-3 FATTY ACIDS 1000 MG CAPSULE    Take 2 g by mouth daily.     FUROSEMIDE (LASIX) 40 MG TABLET    Take 40 mg by mouth daily.     GLYBURIDE (DIABETA) 5 MG TABLET    Take 1 tablet (5 mg total) by mouth 2 (two) times daily with a meal.   LEVOTHYROXINE (SYNTHROID, LEVOTHROID) 50 MCG TABLET    Take 1 tablet (50 mcg total) by mouth daily.   LISINOPRIL-HYDROCHLOROTHIAZIDE (PRINZIDE,ZESTORETIC) 20-25 MG TABLET    Take 1 tablet by mouth daily.   METFORMIN (GLUMETZA) 1000 MG (MOD) 24 HR TABLET    Take 1,000 mg by mouth 2 (two) times daily  with a meal.     METOPROLOL TARTRATE (LOPRESSOR) 25 MG TABLET    Take 1 tablet (25 mg total) by mouth 2 (two) times daily.   MULTIPLE VITAMIN (MULTIVITAMIN) TABLET    Take 1 tablet by mouth daily.     PAROXETINE (PAXIL) 20 MG TABLET    TAKE ONE TABLET BY MOUTH ONCE DAILY   PRAVASTATIN (PRAVACHOL) 40 MG TABLET    Take 1 tablet (40 mg total) by mouth daily.   PREMARIN VAGINAL CREAM       VICTOZA 18 MG/3ML SOPN    Inject 1.2mg  subcutaneously daily   No Known Allergies  Review of Systems  Constitutional: Negative.   HENT: Negative.   Eyes: Negative.   Respiratory: Negative.   Cardiovascular: Negative.     Gastrointestinal: Positive for abdominal pain and diarrhea. Negative for constipation.  Endocrine: Negative.   Genitourinary: Negative.   Musculoskeletal: Negative.   Skin: Negative.   Allergic/Immunologic: Negative.   Neurological: Negative.   Hematological: Negative.   Psychiatric/Behavioral: Negative.     Social History  Substance Use Topics  . Smoking status: Former Smoker -- 1.50 packs/day for 25 years    Types: Cigarettes    Quit date: 11/24/2001  . Smokeless tobacco: Never Used  . Alcohol Use: 0.0 oz/week    0 Standard drinks or equivalent per week     Comment: OCCASIONALLY DRINKS WINE, ONCE A WEEK   Objective:   BP 142/98 mmHg  Pulse 73  Temp(Src) 98 F (36.7 C) (Oral)  Resp 16  Wt 211 lb 9.6 oz (95.981 kg)  SpO2 96%  Physical Exam  Constitutional: She is oriented to person, place, and time. She appears well-developed and well-nourished.  HENT:  Head: Normocephalic.  Right Ear: External ear normal.  Left Ear: External ear normal.  Mouth/Throat: Oropharynx is clear and moist.  Eyes: Conjunctivae are normal.  Neck: Normal range of motion. Neck supple.  Cardiovascular: Normal rate and regular rhythm.   Unchanged systolic Grade [id number] murmur.  Pulmonary/Chest: Effort normal and breath sounds normal.  Abdominal: Soft. Bowel sounds are normal.  Musculoskeletal:  Tingling sensation in plantar surface of the left great toe to test sensation with nylon string. Good pulses. Purple discoloration to right 4th toe over DIP joint from past spider bite. No open wound or blister. No pain.  Neurological: She is alert and oriented to person, place, and time.  Psychiatric: She has a normal mood and affect. Her behavior is normal.      Assessment & Plan:     1. Diabetic peripheral neuropathy associated with type 2 diabetes mellitus ([place]) Still taking the Victoza 1.2 mg injection daily with Metformin 1000 mg two daily, Glyburide 5 mg BID and Metformin-ER 1000 mg two daily.  Microalbumin 50 mg/L today. Will continue Prinzide for BP control and renal protection. Follow diabetic diet and try to lose some weight. Recheck CMP, CBC and Hgb A1C. Recheck pending reports. - Comprehensive metabolic panel - Hemoglobin A1c - POCT UA - Microalbumin  2. Essential (primary) hypertension Slightly elevated BP today. Still tolerating Prinzide 20-25 mg qd without side effects. Denies muscle cramps or palpitations. Recheck labs and continue present medications. - CBC with Differential/Platelet  3. Hyperlipidemia, mixed Has lost 2 lbs since December 2016. Continues Omega-3 Fish Oil 2 gm qd and Pravastatin 40 mg qd without complaints of muscle pains. Recheck lipid panel and follow up pending report. - Lipid panel  4. Hypothyroidism, unspecified hypothyroidism type Denies palpitations, tremor or depressive symptoms. Forgot  to take Levothyroxine 50 mcg qd a couple times recently. Will recheck thyroid function. - TSH - T4  5. Aortic valve stenosis Denies chest pain or peripheral edema. Heart murmur essentially unchanged. Will schedule follow up with Dr. Rockey Situ soon (usually sees him annually).  6. Spastic colon Intermittent diarrhea and bloating followed by a couple days of no BM. Denies hard constipated stools and no blood/melena. Gets relief from bloating and stomach cramps with use of Pepto-Bismol. Recommend adding probiotic supplement and recheck if any worsening. May need referral to gastroenterologist.

## 2016-02-26 LAB — TSH: TSH: 3.63 u[IU]/mL (ref 0.450–4.500)

## 2016-02-26 LAB — CBC WITH DIFFERENTIAL/PLATELET
BASOS: 0 %
Basophils Absolute: 0 10*3/uL (ref 0.0–0.2)
EOS (ABSOLUTE): 0.1 10*3/uL (ref 0.0–0.4)
Eos: 2 %
HEMOGLOBIN: 12.3 g/dL (ref 11.1–15.9)
Hematocrit: 38.5 % (ref 34.0–46.6)
IMMATURE GRANS (ABS): 0 10*3/uL (ref 0.0–0.1)
IMMATURE GRANULOCYTES: 0 %
LYMPHS: 27 %
Lymphocytes Absolute: 1.7 10*3/uL (ref 0.7–3.1)
MCH: 25 pg — AB (ref 26.6–33.0)
MCHC: 31.9 g/dL (ref 31.5–35.7)
MCV: 78 fL — AB (ref 79–97)
Monocytes Absolute: 0.3 10*3/uL (ref 0.1–0.9)
Monocytes: 5 %
NEUTROS ABS: 4.2 10*3/uL (ref 1.4–7.0)
NEUTROS PCT: 66 %
Platelets: 164 10*3/uL (ref 150–379)
RBC: 4.92 x10E6/uL (ref 3.77–5.28)
RDW: 15.4 % (ref 12.3–15.4)
WBC: 6.4 10*3/uL (ref 3.4–10.8)

## 2016-02-26 LAB — COMPREHENSIVE METABOLIC PANEL
A/G RATIO: 1.7 (ref 1.1–2.5)
ALBUMIN: 4.3 g/dL (ref 3.5–5.5)
ALT: 34 IU/L — AB (ref 0–32)
AST: 47 IU/L — ABNORMAL HIGH (ref 0–40)
Alkaline Phosphatase: 77 IU/L (ref 39–117)
BUN/Creatinine Ratio: 26 — ABNORMAL HIGH (ref 9–23)
BUN: 13 mg/dL (ref 6–24)
CALCIUM: 8.8 mg/dL (ref 8.7–10.2)
CHLORIDE: 95 mmol/L — AB (ref 96–106)
CO2: 22 mmol/L (ref 18–29)
CREATININE: 0.5 mg/dL — AB (ref 0.57–1.00)
GFR calc non Af Amer: 109 mL/min/{1.73_m2} (ref >59)
GFR, EST AFRICAN AMERICAN: 126 mL/min/{1.73_m2} (ref >59)
GLOBULIN, TOTAL: 2.5 g/dL (ref 1.5–4.5)
Glucose: 141 mg/dL — ABNORMAL HIGH (ref 65–99)
Potassium: 4 mmol/L (ref 3.5–5.2)
Sodium: 142 mmol/L (ref 134–144)
TOTAL PROTEIN: 6.8 g/dL (ref 6.0–8.5)

## 2016-02-26 LAB — HEMOGLOBIN A1C
Est. average glucose Bld gHb Est-mCnc: 154 mg/dL
Hgb A1c MFr Bld: 7 % — ABNORMAL HIGH (ref 4.8–5.6)

## 2016-02-26 LAB — LIPID PANEL
CHOLESTEROL TOTAL: 143 mg/dL (ref 100–199)
Chol/HDL Ratio: 3.5 ratio units (ref 0.0–4.4)
HDL: 41 mg/dL (ref >39)
LDL CALC: 72 mg/dL (ref 0–99)
Triglycerides: 150 mg/dL — ABNORMAL HIGH (ref 0–149)
VLDL CHOLESTEROL CAL: 30 mg/dL (ref 5–40)

## 2016-02-26 LAB — T4: T4 TOTAL: 13.5 ug/dL — AB (ref 4.5–12.0)

## 2016-02-28 ENCOUNTER — Telehealth: Payer: Self-pay

## 2016-02-28 NOTE — Telephone Encounter (Signed)
-----   Message from Margo Common, [place] sent at 02/27/2016  4:58 PM EDT ----- Blood sugar better but hgb A1C is worse. Thyroid level high and could be the cause of some of the sweating. Recommend cutting the Levothyroxine to 25 mcg qd (can cut tablet in half). Increase Victoza to 1.8 mg daily and recheck diabetes in 3 months.

## 2016-02-28 NOTE — Telephone Encounter (Signed)
Attempted to contact patient.  No answer no voicemail.

## 2016-03-01 NOTE — Telephone Encounter (Signed)
Pt returned your call.  Please use her work number is [phone].  Thanks [employer]

## 2016-03-01 NOTE — Telephone Encounter (Signed)
Patient advised as directed below. Patient verbalized understanding and agrees with plan of care.  

## 2016-03-01 NOTE — Telephone Encounter (Signed)
LMTCB on work number [phone].

## 2016-03-01 NOTE — Telephone Encounter (Signed)
Attempted to contact patient. No answer nor voicemail on home number. LMTCB on work number.

## 2016-03-12 ENCOUNTER — Other Ambulatory Visit: Payer: Self-pay | Admitting: Family Medicine

## 2016-04-02 ENCOUNTER — Other Ambulatory Visit: Payer: Self-pay

## 2016-04-02 MED ORDER — PAROXETINE HCL 20 MG PO TABS: 20.0000 mg | ORAL_TABLET | Freq: Every day | ORAL | Status: DC

## 2016-04-02 NOTE — Telephone Encounter (Signed)
Refill request received from [facility] requesting Paroxetine 20 mg.

## 2016-05-17 ENCOUNTER — Other Ambulatory Visit: Payer: Self-pay

## 2016-05-17 DIAGNOSIS — E119 Type 2 diabetes mellitus without complications: Secondary | ICD-10-CM

## 2016-05-17 MED ORDER — GLYBURIDE 5 MG PO TABS: 5.0000 mg | ORAL_TABLET | Freq: Two times a day (BID) | ORAL | Status: DC

## 2016-05-17 NOTE — Telephone Encounter (Signed)
Patient requesting refill on medication. Patient normally sees [facility]. Thanks!

## 2016-05-21 ENCOUNTER — Other Ambulatory Visit: Payer: Self-pay

## 2016-05-21 MED ORDER — LISINOPRIL-HYDROCHLOROTHIAZIDE 20-25 MG PO TABS: 1.0000 | ORAL_TABLET | Freq: Every day | ORAL | Status: DC

## 2016-05-21 MED ORDER — METOPROLOL TARTRATE 25 MG PO TABS: 25.0000 mg | ORAL_TABLET | Freq: Two times a day (BID) | ORAL | Status: DC

## 2016-05-21 NOTE — Telephone Encounter (Signed)
Patient requesting refills through email. Thanks!

## 2016-05-28 ENCOUNTER — Telehealth: Payer: Self-pay | Admitting: Family Medicine

## 2016-05-28 NOTE — Telephone Encounter (Signed)
lisinopril-hydrochlorothiazide (PRINZIDE,ZESTORETIC) 20-25 MG tablet  and  metoprolol tartrate (LOPRESSOR) 25 MG tablet Pt uses [facility] on [address].

## 2016-05-28 NOTE — Telephone Encounter (Signed)
Advise patient refills of Lisinopril/HCTZ and Metoprolol were sent and received at the [facility]. on 05-21-16. Check with pharmacy and due for recheck of diabetes and blood pressure at 06-01-16 appointment.

## 2016-05-29 NOTE — Telephone Encounter (Signed)
Pt advised to check with pharmacy and let us know if there is a problem with the refill from June 6th-aa

## 2016-06-01 ENCOUNTER — Ambulatory Visit (INDEPENDENT_AMBULATORY_CARE_PROVIDER_SITE_OTHER): Payer: Managed Care, Other (non HMO) | Admitting: Family Medicine

## 2016-06-01 ENCOUNTER — Encounter: Payer: Self-pay | Admitting: Family Medicine

## 2016-06-01 VITALS — BP 132/78 | HR 80 | Temp 98.4°F | Resp 16 | Wt 211.0 lb

## 2016-06-01 DIAGNOSIS — E1142 Type 2 diabetes mellitus with diabetic polyneuropathy: Secondary | ICD-10-CM

## 2016-06-01 DIAGNOSIS — E782 Mixed hyperlipidemia: Secondary | ICD-10-CM

## 2016-06-01 DIAGNOSIS — E039 Hypothyroidism, unspecified: Secondary | ICD-10-CM | POA: Diagnosis not present

## 2016-06-01 DIAGNOSIS — I10 Essential (primary) hypertension: Secondary | ICD-10-CM

## 2016-06-01 DIAGNOSIS — R609 Edema, unspecified: Secondary | ICD-10-CM

## 2016-06-01 MED ORDER — METFORMIN HCL ER (MOD) 1000 MG PO TB24: 1000.0000 mg | ORAL_TABLET | Freq: Two times a day (BID) | ORAL | Status: DC

## 2016-06-01 MED ORDER — LEVOTHYROXINE SODIUM 50 MCG PO TABS: 50.0000 ug | ORAL_TABLET | Freq: Every day | ORAL | Status: DC

## 2016-06-01 NOTE — Progress Notes (Signed)
Patient ID: Emily Tapia, female   DOB: [birth year], 55 y.o.   MRN: [id number]       Patient: Emily Tapia Female    DOB: 1961/03/16   55 y.o.   MRN: [id number] Visit Date: 06/01/2016  Today's Provider: Vernie Murders, PA   No chief complaint on file.  Subjective:    HPI  Diabetes Mellitus Type II, Follow-up:   Lab Results  Component Value Date   HGBA1C 7.0* 02/24/2016   HGBA1C 6.2* 09/03/2014   Last seen for diabetes 3 months ago.  Management since then includes increasing Victoza. She reports good compliance with treatment. She is not having side effects.   Episodes of hypoglycemia? no   Weight trend: stable Prior visit with dietician: no Current diet: well balanced Current exercise: none  ------------------------------------------------------------------------   Hypertension, follow-up:  BP Readings from Last 3 Encounters:  02/24/16 142/98  12/16/15 126/84  07/08/15 132/84    She was last seen for hypertension 3 months ago.  BP at that visit was 142/98. Management since that visit includes no change .She reports good compliance with treatment. She is not having side effects.  She is not exercising. She is not adherent to low salt diet.   Outside blood pressures are occasionally checked. She is experiencing none.  Patient denies chest pain, fatigue, irregular heart beat, palpitations and tachypnea.   Cardiovascular risk factors include diabetes mellitus and dyslipidemia.    ------------------------------------------------------------------------    Lipid/Cholesterol, Follow-up:   Last seen for this 3 months ago.  Management since that visit includes no change.  Last Lipid Panel:    Component Value Date/Time   CHOL 143 02/24/2016 1003   CHOL 181 09/03/2014   TRIG 150* 02/24/2016 1003   HDL 41 02/24/2016 1003   HDL 36 09/03/2014   CHOLHDL 3.5 02/24/2016 1003   LDLCALC 72 02/24/2016 1003   LDLCALC 101 09/03/2014    She  reports good compliance with treatment. She is not having side effects.   Wt Readings from Last 3 Encounters:  02/24/16 211 lb 9.6 oz (95.981 kg)  12/16/15 213 lb (96.616 kg)  07/08/15 212 lb (96.163 kg)    ------------------------------------------------------------------------ Past Medical History  Diagnosis Date  . CHF (congestive heart failure) ([place])   . Edema   . Heart murmur   . Hypertension   . Thyroid disease   . Diabetes mellitus    Past Surgical History  Procedure Laterality Date  . Cesarean section      x 2  . Elbow surgery    . Breast biopsy      x2  . Dilation and curettage of uterus    . Abdominal hysterectomy  11/17/2013  . Cholecystectomy  2003    DUE TO STONES  . Polypectomy  1995   Family History  Problem Relation Age of Onset  . Hyperlipidemia Mother   . Thyroid disease Mother   . Hashimoto's thyroiditis Mother   . Heart disease Father     open heart surgery  . Heart failure Father   . Diabetes Father   . Dementia Father   . Thyroid disease Brother   . Breast cancer Maternal Grandmother   . Diabetes Paternal Grandmother   . Prostate cancer Paternal Grandfather    No Known Allergies   Current Meds  Medication Sig  . aspirin 81 MG EC tablet Take 81 mg by mouth daily.    . Cholecalciferol (VITAMIN D3) 1000 UNITS CAPS Take 1 capsule by mouth daily.    Marland Kitchen  clonazePAM (KLONOPIN) 1 MG tablet Take 1 mg by mouth 2 (two) times daily as needed.    . Ferrous Sulfate (IRON) 325 (65 FE) MG TABS Take 1 tablet by mouth daily.    . fish oil-omega-3 fatty acids 1000 MG capsule Take 2 g by mouth daily.    . furosemide (LASIX) 40 MG tablet Take 40 mg by mouth daily.    Marland Kitchen glyBURIDE (DIABETA) 5 MG tablet Take 1 tablet (5 mg total) by mouth 2 (two) times daily with a meal.  . levothyroxine (SYNTHROID, LEVOTHROID) 50 MCG tablet Take 1 tablet (50 mcg total) by mouth daily.  Marland Kitchen lisinopril-hydrochlorothiazide (PRINZIDE,ZESTORETIC) 20-25 MG tablet Take 1 tablet by mouth  daily.  . metFORMIN (GLUMETZA) 1000 MG (MOD) 24 hr tablet Take 1,000 mg by mouth 2 (two) times daily with a meal.    . metoprolol tartrate (LOPRESSOR) 25 MG tablet Take 1 tablet (25 mg total) by mouth 2 (two) times daily.  . Multiple Vitamin (MULTIVITAMIN) tablet Take 1 tablet by mouth daily.    Marland Kitchen PARoxetine (PAXIL) 20 MG tablet Take 1 tablet (20 mg total) by mouth daily.  . pravastatin (PRAVACHOL) 40 MG tablet TAKE ONE TABLET BY MOUTH ONCE DAILY  . PREMARIN vaginal cream   . VICTOZA 18 MG/3ML SOPN Inject 1.2mg  subcutaneously daily    Review of Systems  Constitutional: Negative.   Respiratory: Negative.   Cardiovascular: Negative.   Gastrointestinal: Negative.   Endocrine: Negative.   Musculoskeletal: Negative.     Social History  Substance Use Topics  . Smoking status: Former Smoker -- 1.50 packs/day for 25 years    Types: Cigarettes    Quit date: 11/24/2001  . Smokeless tobacco: Never Used  . Alcohol Use: 0.0 oz/week    0 Standard drinks or equivalent per week     Comment: OCCASIONALLY DRINKS WINE, ONCE A WEEK   Objective:   BP 132/78 mmHg  Pulse 80  Temp(Src) 98.4 F (36.9 C)  Resp 16  Wt 211 lb (95.709 kg)  Physical Exam  Constitutional: She is oriented to person, place, and time. She appears well-developed and well-nourished. No distress.  HENT:  Head: Normocephalic and atraumatic.  Right Ear: Hearing and external ear normal.  Left Ear: Hearing and external ear normal.  Nose: Nose normal.  Mouth/Throat: Oropharynx is clear and moist.  Eyes: Conjunctivae and lids are normal. Right eye exhibits no discharge. Left eye exhibits no discharge. No scleral icterus.  Neck: Neck supple.  Cardiovascular: Normal rate and regular rhythm.   Pulmonary/Chest: Effort normal and breath sounds normal. No respiratory distress.  Abdominal: Soft. Bowel sounds are normal.  Musculoskeletal: Normal range of motion.  Neurological: She is alert and oriented to person, place, and time.    Skin: Skin is intact. No lesion and no rash noted.  Psychiatric: She has a normal mood and affect. Her speech is normal and behavior is normal. Thought content normal.      Assessment & Plan:     1. Diabetic peripheral neuropathy associated with type 2 diabetes mellitus (H[facility]) Feels FBS fairly stable. No recent vision changes. Bifocals make it difficult to focus on computer screen without putting neck in a hyperextended position ([employer] at work could help). Tolerating Metformin, Victoza and glyburide without hypoglycemic episodes. Continue present dosage and recheck labs. Due for eye exam and podiatry screening with tingling in great toes. - CBC with Differential/Platelet - Comprehensive metabolic panel - Hemoglobin A1c - metFORMIN (GLUMETZA) 1000 MG (MOD) 24 hr tablet; Take  1 tablet (1,000 mg total) by mouth 2 (two) times daily with a meal.  Dispense: 180 tablet; Refill: 3  2. Hyperlipidemia, mixed Stable with continued use of Pravastatin and fish Oil. Follow low fat restrictions in diet and work on more exercise ([employer] at work station would help). Recheck routine labs. - Comprehensive metabolic panel  3. Edema, unspecified type No weight gain in the past 3 months. Using Lasix prn edema of feet and lower legs. [employer] at work could keep her from sitting too long each day. Recheck CMP. - Comprehensive metabolic panel  4. Essential hypertension Good control on the Lisinopril/HCTZ and Metoprolol without side effects. Recheck labs. - CBC with Differential/Platelet - Comprehensive metabolic panel  5. Hypothyroidism, unspecified hypothyroidism type No recent weight gain. Tolerating Levothyroxine without palpitations, chest pain or tremor. Some intermittent edema treated with Lasix. Recheck labs and follow up in 3 months. - CBC with Differential/Platelet - T4 - TSH - levothyroxine (SYNTHROID, LEVOTHROID) 50 MCG tablet; Take 1 tablet (50 mcg total) by mouth daily.  Dispense: 90  tablet; Refill: [facility]

## 2016-06-02 LAB — CBC WITH DIFFERENTIAL/PLATELET
BASOS ABS: 0 10*3/uL (ref 0.0–0.2)
BASOS: 0 %
EOS (ABSOLUTE): 0.2 10*3/uL (ref 0.0–0.4)
Eos: 2 %
Hematocrit: 38 % (ref 34.0–46.6)
Hemoglobin: 12.1 g/dL (ref 11.1–15.9)
IMMATURE GRANS (ABS): 0 10*3/uL (ref 0.0–0.1)
Immature Granulocytes: 0 %
LYMPHS ABS: 1.8 10*3/uL (ref 0.7–3.1)
LYMPHS: 26 %
MCH: 25.1 pg — AB (ref 26.6–33.0)
MCHC: 31.8 g/dL (ref 31.5–35.7)
MCV: 79 fL (ref 79–97)
MONOCYTES: 6 %
MONOS ABS: 0.4 10*3/uL (ref 0.1–0.9)
NEUTROS ABS: 4.5 10*3/uL (ref 1.4–7.0)
Neutrophils: 66 %
PLATELETS: 183 10*3/uL (ref 150–379)
RBC: 4.83 x10E6/uL (ref 3.77–5.28)
RDW: 14.9 % (ref 12.3–15.4)
WBC: 6.9 10*3/uL (ref 3.4–10.8)

## 2016-06-02 LAB — HEMOGLOBIN A1C
Est. average glucose Bld gHb Est-mCnc: 151 mg/dL
HEMOGLOBIN A1C: 6.9 % — AB (ref 4.8–5.6)

## 2016-06-02 LAB — COMPREHENSIVE METABOLIC PANEL
A/G RATIO: 1.8 (ref 1.2–2.2)
ALK PHOS: 82 IU/L (ref 39–117)
ALT: 26 IU/L (ref 0–32)
AST: 29 IU/L (ref 0–40)
Albumin: 4.4 g/dL (ref 3.5–5.5)
BILIRUBIN TOTAL: 0.4 mg/dL (ref 0.0–1.2)
BUN/Creatinine Ratio: 22 (ref 9–23)
BUN: 13 mg/dL (ref 6–24)
CHLORIDE: 96 mmol/L (ref 96–106)
CO2: 28 mmol/L (ref 18–29)
Calcium: 9 mg/dL (ref 8.7–10.2)
Creatinine, Ser: 0.58 mg/dL (ref 0.57–1.00)
GFR calc non Af Amer: 103 mL/min/{1.73_m2} (ref >59)
GFR, EST AFRICAN AMERICAN: 119 mL/min/{1.73_m2} (ref >59)
GLUCOSE: 127 mg/dL — AB (ref 65–99)
Globulin, Total: 2.4 g/dL (ref 1.5–4.5)
POTASSIUM: 4.4 mmol/L (ref 3.5–5.2)
Sodium: 140 mmol/L (ref 134–144)
TOTAL PROTEIN: 6.8 g/dL (ref 6.0–8.5)

## 2016-06-02 LAB — T4: T4 TOTAL: 13.3 ug/dL — AB (ref 4.5–12.0)

## 2016-06-02 LAB — TSH: TSH: 5.52 u[IU]/mL — ABNORMAL HIGH (ref 0.450–4.500)

## 2016-06-05 ENCOUNTER — Other Ambulatory Visit: Payer: Self-pay | Admitting: Family Medicine

## 2016-06-05 DIAGNOSIS — R7989 Other specified abnormal findings of blood chemistry: Secondary | ICD-10-CM

## 2016-06-06 LAB — FOLLICLE STIMULATING HORMONE: FSH: 14 m[IU]/mL

## 2016-06-06 LAB — PROLACTIN: PROLACTIN: 7.3 ng/mL (ref 4.8–23.3)

## 2016-06-06 LAB — SPECIMEN STATUS REPORT

## 2016-06-06 LAB — LUTEINIZING HORMONE: LH: 13.6 m[IU]/mL

## 2016-06-08 ENCOUNTER — Telehealth: Payer: Self-pay

## 2016-06-08 NOTE — Telephone Encounter (Signed)
Left message to call back.     Notes Recorded by Margo Common, PA on 06/07/2016 at 5:13 PM Be sure patient received the lab report as noted and arranged referral. Notes Recorded by Margo Common, PA on 06/05/2016 at 5:28 PM Normal LH, FSH and Prolactin levels. Need referral to endocrinologist to further evaluate elevation of TSH and T4 for possible pituitary hyperthyroidism. (Referral order in chart). Notes Recorded by Wilder Glade, CMA on 06/05/2016 at 9:40 AM Tests were added to specimen. Will await for results to notify patient all at one time.

## 2016-06-08 NOTE — Telephone Encounter (Signed)
Pt returned call. Thanks TNP °

## 2016-06-08 NOTE — Telephone Encounter (Signed)
-----   Message from [employer], [place] sent at 06/04/2016  8:28 AM EDT ----- Glucose better than 3 months ago and Hgb A1C below goal of 7.0. TSH and T4 elevated. Ask lab to run [place], [place] and prolactin levels.

## 2016-06-11 ENCOUNTER — Other Ambulatory Visit: Payer: Self-pay

## 2016-06-11 MED ORDER — FUROSEMIDE 40 MG PO TABS: 40.0000 mg | ORAL_TABLET | Freq: Every day | ORAL | Status: DC

## 2016-06-11 NOTE — Telephone Encounter (Signed)
Patient is requesting refill

## 2016-06-15 NOTE — Telephone Encounter (Signed)
Pt advised of all-aa

## 2016-07-10 ENCOUNTER — Other Ambulatory Visit: Payer: Self-pay

## 2016-07-10 DIAGNOSIS — E119 Type 2 diabetes mellitus without complications: Secondary | ICD-10-CM

## 2016-07-10 MED ORDER — GLYBURIDE 5 MG PO TABS: 5.0000 mg | ORAL_TABLET | Freq: Two times a day (BID) | ORAL | 6 refills | Status: DC

## 2016-07-10 NOTE — Telephone Encounter (Signed)
Patient sent an email requesting refill on medication. Thanks!

## 2016-07-24 NOTE — Telephone Encounter (Signed)
error 

## 2016-08-19 ENCOUNTER — Encounter: Payer: Self-pay | Admitting: Emergency Medicine

## 2016-08-19 ENCOUNTER — Emergency Department: Payer: Managed Care, Other (non HMO)

## 2016-08-19 ENCOUNTER — Emergency Department
Admission: EM | Admit: 2016-08-19 | Discharge: 2016-08-19 | Disposition: A | Discharge status: Home / Self Care | Payer: Managed Care, Other (non HMO) | Attending: Emergency Medicine | Admitting: Emergency Medicine

## 2016-08-19 DIAGNOSIS — Z7984 Long term (current) use of oral hypoglycemic drugs: Secondary | ICD-10-CM | POA: Insufficient documentation

## 2016-08-19 DIAGNOSIS — Z87891 Personal history of nicotine dependence: Secondary | ICD-10-CM | POA: Diagnosis not present

## 2016-08-19 DIAGNOSIS — I110 Hypertensive heart disease with heart failure: Secondary | ICD-10-CM | POA: Diagnosis not present

## 2016-08-19 DIAGNOSIS — J189 Pneumonia, unspecified organism: Secondary | ICD-10-CM

## 2016-08-19 DIAGNOSIS — Z79899 Other long term (current) drug therapy: Secondary | ICD-10-CM | POA: Diagnosis not present

## 2016-08-19 DIAGNOSIS — I509 Heart failure, unspecified: Secondary | ICD-10-CM | POA: Diagnosis not present

## 2016-08-19 DIAGNOSIS — E119 Type 2 diabetes mellitus without complications: Secondary | ICD-10-CM | POA: Diagnosis not present

## 2016-08-19 DIAGNOSIS — Z7982 Long term (current) use of aspirin: Secondary | ICD-10-CM | POA: Insufficient documentation

## 2016-08-19 DIAGNOSIS — E039 Hypothyroidism, unspecified: Secondary | ICD-10-CM | POA: Diagnosis not present

## 2016-08-19 DIAGNOSIS — R0602 Shortness of breath: Secondary | ICD-10-CM | POA: Diagnosis present

## 2016-08-19 LAB — CBC WITH DIFFERENTIAL/PLATELET
BASOS ABS: 0 10*3/uL (ref 0–0.1)
Basophils Relative: 1 %
EOS ABS: 0.1 10*3/uL (ref 0–0.7)
EOS PCT: 2 %
HCT: 34.6 % — ABNORMAL LOW (ref 35.0–47.0)
Hemoglobin: 11.5 g/dL — ABNORMAL LOW (ref 12.0–16.0)
LYMPHS ABS: 1 10*3/uL (ref 1.0–3.6)
LYMPHS PCT: 15 %
MCH: 25.2 pg — AB (ref 26.0–34.0)
MCHC: 33.2 g/dL (ref 32.0–36.0)
MCV: 75.9 fL — AB (ref 80.0–100.0)
MONO ABS: 0.4 10*3/uL (ref 0.2–0.9)
Monocytes Relative: 6 %
Neutro Abs: 5.5 10*3/uL (ref 1.4–6.5)
Neutrophils Relative %: 78 %
PLATELETS: 123 10*3/uL — AB (ref 150–440)
RBC: 4.56 MIL/uL (ref 3.80–5.20)
RDW: 15.5 % — AB (ref 11.5–14.5)
WBC: 7.1 10*3/uL (ref 3.6–11.0)

## 2016-08-19 LAB — COMPREHENSIVE METABOLIC PANEL
ALT: 30 U/L (ref 14–54)
ANION GAP: 7 (ref 5–15)
AST: 27 U/L (ref 15–41)
Albumin: 3.8 g/dL (ref 3.5–5.0)
Alkaline Phosphatase: 70 U/L (ref 38–126)
BILIRUBIN TOTAL: 0.7 mg/dL (ref 0.3–1.2)
BUN: 15 mg/dL (ref 6–20)
CHLORIDE: 103 mmol/L (ref 101–111)
CO2: 28 mmol/L (ref 22–32)
Calcium: 8.5 mg/dL — ABNORMAL LOW (ref 8.9–10.3)
Creatinine, Ser: 0.56 mg/dL (ref 0.44–1.00)
GFR calc Af Amer: >60 mL/min (ref >60)
Glucose, Bld: 250 mg/dL — ABNORMAL HIGH (ref 65–99)
POTASSIUM: 4.4 mmol/L (ref 3.5–5.1)
Sodium: 138 mmol/L (ref 135–145)
TOTAL PROTEIN: 7.1 g/dL (ref 6.5–8.1)

## 2016-08-19 LAB — FIBRIN DERIVATIVES D-DIMER (ARMC ONLY): FIBRIN DERIVATIVES D-DIMER (ARMC): 428 (ref 0–499)

## 2016-08-19 LAB — TROPONIN I

## 2016-08-19 LAB — BRAIN NATRIURETIC PEPTIDE: B NATRIURETIC PEPTIDE 5: 107 pg/mL — AB (ref 0.0–100.0)

## 2016-08-19 MED ORDER — FUROSEMIDE 10 MG/ML IJ SOLN
40.0000 mg | Freq: Once | INTRAMUSCULAR | Status: AC
  Administered 2016-08-19: 40 mg via INTRAVENOUS
  Filled 2016-08-19: qty 4

## 2016-08-19 MED ORDER — ALBUTEROL SULFATE HFA 108 (90 BASE) MCG/ACT IN AERS: 2.0000 | INHALATION_SPRAY | Freq: Four times a day (QID) | RESPIRATORY_TRACT | 2 refills | Status: DC | PRN

## 2016-08-19 MED ORDER — AZITHROMYCIN 500 MG PO TABS
500.0000 mg | ORAL_TABLET | Freq: Once | ORAL | Status: AC
  Administered 2016-08-19: 500 mg via ORAL
  Filled 2016-08-19: qty 1

## 2016-08-19 MED ORDER — IPRATROPIUM-ALBUTEROL 0.5-2.5 (3) MG/3ML IN SOLN
3.0000 mL | Freq: Once | RESPIRATORY_TRACT | Status: AC
  Administered 2016-08-19: 3 mL via RESPIRATORY_TRACT
  Filled 2016-08-19: qty 3

## 2016-08-19 MED ORDER — AZITHROMYCIN 250 MG PO TABS: ORAL_TABLET | ORAL | 0 refills | Status: DC

## 2016-08-19 NOTE — ED Triage Notes (Signed)
Pt presents to ED with sudden onset of sob this morning. Pt denies hx of the same. Increase work of breathing noted. Pt reports she started wheezing last night and that her breathing feels tight. infrequent dry cough present during triage which pt states is new this morning.

## 2016-08-19 NOTE — Discharge Instructions (Signed)
Re-start your lasix 40mg  daily. Take Zithromax 1 a day for the next 4 days starting tomorrow. Use the albuterol inhaler 2 puffs as needed every 6 hours for wheezing or shortness of breath. Follow-up with her doctor on Monday. Return to the emergency room if you have chest pain, new or worsening shortness of breath, fever, or any other symptoms concerning to you.

## 2016-08-19 NOTE — ED Notes (Signed)
\  [id number]\ 

## 2016-08-19 NOTE — ED Notes (Signed)
Pt transported to xray 

## 2016-08-19 NOTE — ED Provider Notes (Signed)
[facility] Emergency Department Provider Note  ____________________________________________  Time seen: Approximately 7:00 AM  I have reviewed the triage vital signs and the nursing notes.   HISTORY  Chief Complaint Shortness of Breath   HPI Emily Tapia is a 55 y.o. female a history of aortic stenosis, diabetes, hypertension, hypothyroidism who presents for evaluation of shortness of breath. Patient reports that she.a few well yesterday, more fatigue and wheezing. This morning she woke up she was feeling very short of breath. She reports moderate shortness of breath at rest that increases to severe with minimal exertion and also felt more short of breath when laying in bed earlier this morning. She denies fever or chills. She has had decreased appetite yesterday. She is a former smoker and endorses requiring breathing treatments in the past in the setting of having pneumonia. She denies any history of COPD or asthma. Patient also endorses mild chest tightness that gets worse when the shortness of breath gets worse. She endorse family history of ischemic heart disease in her father. No personal or family history of blood clots. Patient [place] 2 hour drive. She was also understands estrogen pills until a couple weeks ago and she weaned herself off of. She endorses mild swelling of bilateral lower extremities but denies pain. Denies hemoptysis. She also reports that she is supposed to take Lasix every day but hasn't taken any for the last 3 days.  Past Medical History:  Diagnosis Date  . CHF (congestive heart failure) ([place])   . Diabetes mellitus   . Edema   . Heart murmur   . Hypertension   . Thyroid disease     Patient Active Problem List   Diagnosis Date Noted  . Mass of pelvis 07/08/2015  . Hyperlipidemia, mixed 07/08/2015  . Hx of iron deficiency 07/08/2015  . Absolute anemia 04/27/2015  .  Diabetic peripheral neuropathy associated with type 2 diabetes mellitus ([place]) 04/27/2015  . Breath shortness 04/27/2015  . Accumulation of fluid in tissues 04/27/2015  . Cardiac murmur 04/27/2015  . Elective abortion 04/27/2015  . HLD (hyperlipidemia) 04/27/2015  . Decreased potassium in the blood 04/27/2015  . Hypothyroidism 04/27/2015  . Obstructive sleep apnea 04/27/2015  . Disorder of peripheral nervous system ([place]) 04/27/2015  . Obstructive apnea 04/27/2015  . Pain in shoulder 04/27/2015  . Diabetes mellitus, type 2 ([place]) 04/27/2015  . Fibroid 10/25/2013  . Aortic valve stenosis 05/01/2011  . Edema 03/27/2011  . SOB (shortness of breath) 03/27/2011  . Diabetes mellitus ([place]) 03/27/2011  . HTN (hypertension) 03/27/2011  . Essential (primary) hypertension 07/05/2006  . Anxiety, generalized 07/05/2006    Past Surgical History:  Procedure Laterality Date  . ABDOMINAL HYSTERECTOMY  11/17/2013  . BREAST BIOPSY     x2  . CESAREAN SECTION     x 2  . CHOLECYSTECTOMY  2003   DUE TO STONES  . DILATION AND CURETTAGE OF UTERUS    . ELBOW SURGERY    . POLYPECTOMY  1995    Prior to Admission medications   Medication Sig Start Date End Date Taking? Authorizing Provider  aspirin 81 MG EC tablet Take 81 mg by mouth at bedtime.    Yes Historical Provider, MD  Cholecalciferol (VITAMIN D3) 1000 UNITS CAPS Take 1 capsule by mouth every morning.    Yes Historical Provider, MD  fish oil-omega-3 fatty acids 1000 MG capsule Take 1 g by mouth 2 (  two) times daily.    Yes Historical Provider, MD  furosemide (LASIX) 40 MG tablet Take 1 tablet (40 mg total) by mouth daily. Patient taking differently: Take 40 mg by mouth daily as needed for fluid or edema.  06/11/16  Yes Dennis E Chrismon, PA  Glucosamine 500 MG CAPS Take 500 mg by mouth 2 (two) times daily.   Yes Historical Provider, MD  glyBURIDE (DIABETA) 5 MG tablet Take 1 tablet (5 mg total) by mouth 2 (two) times daily with a meal. 07/10/16   Yes Dennis E Chrismon, PA  levothyroxine (SYNTHROID, LEVOTHROID) 50 MCG tablet Take 1 tablet (50 mcg total) by mouth daily. Patient taking differently: Take 50 mcg by mouth daily before breakfast.  06/01/16  Yes Vickki Muff Chrismon, PA  lisinopril-hydrochlorothiazide (PRINZIDE,ZESTORETIC) 20-25 MG tablet Take 1 tablet by mouth daily. Patient taking differently: Take 1 tablet by mouth every morning.  05/21/16  Yes Dennis E Chrismon, PA  metFORMIN (GLUMETZA) 1000 MG (MOD) 24 hr tablet Take 1 tablet (1,000 mg total) by mouth 2 (two) times daily with a meal. 06/01/16  Yes Dennis E Chrismon, PA  metoprolol tartrate (LOPRESSOR) 25 MG tablet Take 1 tablet (25 mg total) by mouth 2 (two) times daily. 05/21/16 05/21/17 Yes Dennis E Chrismon, PA  Multiple Vitamin (MULTIVITAMIN) tablet Take 1 tablet by mouth every morning.    Yes Historical Provider, MD  PARoxetine (PAXIL) 20 MG tablet Take 1 tablet (20 mg total) by mouth daily. Patient taking differently: Take 20 mg by mouth every morning.  04/02/16  Yes Dennis E Chrismon, PA  pravastatin (PRAVACHOL) 40 MG tablet TAKE ONE TABLET BY MOUTH ONCE DAILY Patient taking differently: TAKE ONE TABLET BY MOUTH ONCE DAILY IN THE EVENING. 03/12/16  Yes Dennis E Chrismon, PA  VICTOZA 18 MG/3ML SOPN Inject 1.2mg  subcutaneously daily Patient taking differently: Inject 1.2mg  subcutaneously daily in the morning. 11/14/15  Yes Dennis E Chrismon, PA  albuterol (PROVENTIL HFA;VENTOLIN HFA) 108 (90 Base) MCG/ACT inhaler Inhale 2 puffs into the lungs every 6 (six) hours as needed for wheezing or shortness of breath. 08/19/16   Rudene Re, MD  azithromycin (ZITHROMAX) 250 MG tablet Take 1 a day for 4 days 08/19/16   Rudene Re, MD    Allergies Review of patient's allergies indicates no known allergies.  Family History  Problem Relation Age of Onset  . Hyperlipidemia Mother   . Thyroid disease Mother   . Hashimoto's thyroiditis Mother   . Heart disease Father     open heart  surgery  . Heart failure Father   . Diabetes Father   . Dementia Father   . Thyroid disease Brother   . Breast cancer Maternal Grandmother   . Diabetes Paternal Grandmother   . Prostate cancer Paternal Grandfather     Social History Social History  Substance Use Topics  . Smoking status: Former Smoker    Packs/day: 1.50    Years: 25.00    Types: Cigarettes    Quit date: 11/24/2001  . Smokeless tobacco: Never Used  . Alcohol use 0.0 oz/week     Comment: OCCASIONALLY DRINKS WINE, ONCE A WEEK    Review of Systems  Constitutional: Negative for fever. Eyes: Negative for visual changes. ENT: Negative for sore throat. Cardiovascular: + chest tightness Respiratory: + shortness of breath. Gastrointestinal: Negative for abdominal pain, vomiting or diarrhea. Genitourinary: Negative for dysuria. Musculoskeletal: Negative for back pain. + leg swelling Skin: Negative for rash. Neurological: Negative for headaches, weakness or numbness.  ____________________________________________  PHYSICAL EXAM:  VITAL SIGNS: ED Triage Vitals [08/19/16 0644]  Enc Vitals Group     BP (!) 176/83     Pulse Rate 93     Resp (!) 24     Temp 98.4 F (36.9 C)     Temp Source Oral     SpO2 92 %     Weight 209 lb (94.8 kg)     Height 5\' 3"  (1.6 m)     Head Circumference      Peak Flow      Pain Score 4     Pain Loc      Pain Edu?      Excl. in [place]?     Constitutional: Alert and oriented. Well appearing and in no apparent distress. HEENT:      Head: Normocephalic and atraumatic.         Eyes: Conjunctivae are normal. Sclera is non-icteric. EOMI. PERRL      Mouth/Throat: Mucous membranes are moist.       Neck: Supple with no signs of meningismus. Cardiovascular: Regular rate and rhythm. No murmurs, gallops, or rubs. 2+ symmetrical distal pulses are present in all extremities. No JVD. Respiratory: Tachypneic, satting 92% on room air, mild increased work of breathing, no crackles or wheezing,  mildly decreased air movement bilaterally Gastrointestinal: Soft, non tender, and non distended with positive bowel sounds. No rebound or guarding. Genitourinary: No CVA tenderness. Musculoskeletal: Trace edema in bilateral lower extremities up to her knees  Neurologic: Normal speech and language. Face is symmetric. Moving all extremities. No gross focal neurologic deficits are appreciated. Skin: Skin is warm, dry and intact. No rash noted. Psychiatric: Mood and affect are normal. Speech and behavior are normal.  ____________________________________________   LABS (all labs ordered are listed, but only abnormal results are displayed)  Labs Reviewed  CBC WITH DIFFERENTIAL/PLATELET - Abnormal; Notable for the following:       Result Value   Hemoglobin 11.5 (*)    HCT 34.6 (*)    MCV 75.9 (*)    MCH 25.2 (*)    RDW 15.5 (*)    Platelets 123 (*)    All other components within normal limits  BRAIN NATRIURETIC PEPTIDE - Abnormal; Notable for the following:    B Natriuretic Peptide 107.0 (*)    All other components within normal limits  COMPREHENSIVE METABOLIC PANEL - Abnormal; Notable for the following:    Glucose, Bld 250 (*)    Calcium 8.5 (*)    All other components within normal limits  TROPONIN I  FIBRIN DERIVATIVES D-DIMER ([facility] ONLY)  TROPONIN I   ____________________________________________  EKG  ED ECG REPORT I, Rudene Re, the attending physician, personally viewed and interpreted this ECG.  Normal sinus rhythm, rate of 88, normal intervals, normal axis, no ST elevations or depressions. Unchanged from prior.  ____________________________________________  RADIOLOGY  CXR:  Mild interstitial edema. ____________________________________________   PROCEDURES  Procedure(s) performed: None Procedures Critical Care performed:  None ____________________________________________   INITIAL IMPRESSION / ASSESSMENT AND PLAN / ED COURSE  55 y.o. female a history  of aortic stenosis, diabetes, hypertension, hypothyroidism who presents for evaluation of shortness of breath in the setting of one day of fatigue and wheezing. Prior h/o wheezing with PNA. No fever or chills. Vital signs showing normal heart rate and afebrile with respiratory rate in the mid 20s and satting 92% on room air, lungs are clear with no crackles or wheezing, trace edema on bilateral lower extremities, no  JVD. Ddx CA-PNA, volume overload, PE, ACS. EKG with no ischemia. Will give one duoneb and reassess. Plan for CXR, CBC, BMP, BNP, troponin, d-dimer.   Clinical Course  Comment By Time  Patient with no history of COPD or asthma however has a history of former smoking and reports improvement of shortness of breath with 1 DuoNeb treatment. A second treatment was ordered. Therefore will treat patient for possible mild COPD exacerbation with PO azithromycin. Will hold off steroids as patient is DM on oral antiglycemic agents only to prevent patient form going into DKA. Patient BNP is elevated at 107. Patient will be given 40 mg of IV Lasix. 2nd troponin due at 10AM. Antecipate discharge and close f/u with PCP for repeat ECHO. Rudene Re, MD 09/03 [phone]  Second troponin negative. Patient remains extremely well appearing. Vital signs are within normal limits, no oxygen requirement. Patient will be discharged home to continue her daily 40 mg of Lasix, 4 more days of azithromycin, and with an albuterol inhaler. Recommended close follow-up with PCP on Monday. Patient is comfortable with this plan. Rudene Re, MD 09/03 1058    Pertinent labs & imaging results that were available during my care of the patient were reviewed by me and considered in my medical decision making (see chart for details).    ____________________________________________   FINAL CLINICAL IMPRESSION(S) / ED DIAGNOSES  Final diagnoses:  Acute on chronic congestive heart failure, unspecified congestive heart failure  type ([place])  Community acquired pneumonia      NEW MEDICATIONS STARTED DURING THIS VISIT:  New Prescriptions   ALBUTEROL (PROVENTIL HFA;VENTOLIN HFA) 108 (90 BASE) MCG/ACT INHALER    Inhale 2 puffs into the lungs every 6 (six) hours as needed for wheezing or shortness of breath.   AZITHROMYCIN (ZITHROMAX) 250 MG TABLET    Take 1 a day for 4 days     Note:  This document was prepared using Dragon voice recognition software and may include unintentional dictation errors.    Rudene Re, MD 08/19/16 1059

## 2016-08-27 ENCOUNTER — Other Ambulatory Visit: Payer: Self-pay

## 2016-08-27 MED ORDER — LISINOPRIL-HYDROCHLOROTHIAZIDE 20-25 MG PO TABS: 1.0000 | ORAL_TABLET | Freq: Every day | ORAL | 0 refills | Status: DC

## 2016-08-27 NOTE — Telephone Encounter (Signed)
Refill request received from patient e-mail requesting Lisinopril-HCTZ be sent to [facility]

## 2016-08-31 ENCOUNTER — Encounter: Payer: Self-pay | Admitting: Family Medicine

## 2016-08-31 ENCOUNTER — Ambulatory Visit (INDEPENDENT_AMBULATORY_CARE_PROVIDER_SITE_OTHER): Payer: Managed Care, Other (non HMO) | Admitting: Family Medicine

## 2016-08-31 VITALS — BP 126/78 | HR 74 | Temp 98.3°F | Wt 205.2 lb

## 2016-08-31 DIAGNOSIS — E782 Mixed hyperlipidemia: Secondary | ICD-10-CM

## 2016-08-31 DIAGNOSIS — I350 Nonrheumatic aortic (valve) stenosis: Secondary | ICD-10-CM | POA: Diagnosis not present

## 2016-08-31 DIAGNOSIS — I5043 Acute on chronic combined systolic (congestive) and diastolic (congestive) heart failure: Secondary | ICD-10-CM

## 2016-08-31 DIAGNOSIS — Z8639 Personal history of other endocrine, nutritional and metabolic disease: Secondary | ICD-10-CM | POA: Diagnosis not present

## 2016-08-31 DIAGNOSIS — E1142 Type 2 diabetes mellitus with diabetic polyneuropathy: Secondary | ICD-10-CM

## 2016-08-31 DIAGNOSIS — I10 Essential (primary) hypertension: Secondary | ICD-10-CM | POA: Diagnosis not present

## 2016-08-31 DIAGNOSIS — J189 Pneumonia, unspecified organism: Secondary | ICD-10-CM | POA: Diagnosis not present

## 2016-08-31 NOTE — Progress Notes (Signed)
Patient: Dashiya Dam Female    DOB: 07/19/1961   55 y.o.   MRN: [id number] Visit Date: 08/31/2016  Today's Provider: Vernie Murders, PA   Chief Complaint  Patient presents with  . Hyperlipidemia  . Hypertension  . Diabetes  . Hypothyroidism  . Follow-up   Subjective:    HPI  Diabetes Mellitus Type II, Follow-up:   Lab Results  Component Value Date   HGBA1C 6.9 (H) 06/01/2016   HGBA1C 7.0 (H) 02/24/2016   HGBA1C 6.2 (A) 09/03/2014   Last seen for diabetes 3 months ago.  Management since then includes continue Metformin, Victoza and Glyburide She reports excellent compliance with treatment. She is not having side effects.  Current symptoms include none and have been stable. Home blood sugar records: fasting range: 200's  Episodes of hypoglycemia? no   Current Insulin Regimen: Victoza Weight trend: stable Current diet: low salt Current exercise: walking  ------------------------------------------------------------------------   Hypertension, follow-up:  BP Readings from Last 3 Encounters:  08/19/16 124/68  06/01/16 132/78  02/24/16 (!) 142/98    She was last seen for hypertension 3 months ago.  BP at that visit was 132/78. Management since that visit includes continue lisinopril/HCTZ and Metoprolol.  .She reports excellent compliance with treatment. She is not having side effects.  She is exercising. She is adherent to low salt diet.   Outside blood pressures are not being checked. She is experiencing none.  Patient denies none.   Cardiovascular risk factors include diabetes mellitus, dyslipidemia, hypertension and obesity (BMI >= 30 kg/m2).  Use of agents associated with hypertension: none.   ------------------------------------------------------------------------    Lipid/Cholesterol, Follow-up:   Last seen for this 3 months ago.  Management since that visit includes continue Pravastatin.  Last Lipid Panel:    Component Value  Date/Time   CHOL 143 02/24/2016 1003   TRIG 150 (H) 02/24/2016 1003   HDL 41 02/24/2016 1003   CHOLHDL 3.5 02/24/2016 1003   LDLCALC 72 02/24/2016 1003    She reports excellent compliance with treatment. She is not having side effects.   Wt Readings from Last 3 Encounters:  08/19/16 209 lb (94.8 kg)  06/01/16 211 lb (95.7 kg)  02/24/16 211 lb 9.6 oz (96 kg)    ------------------------------------------------------------------------  Hypothyroid, follow-up:  TSH  Date Value Ref Range Status  06/01/2016 5.520 (H) 0.450 - 4.500 uIU/mL Final  02/24/2016 3.630 0.450 - 4.500 uIU/mL Final  07/12/2015 4.450 0.450 - 4.500 uIU/mL Final   Wt Readings from Last 3 Encounters:  08/19/16 209 lb (94.8 kg)  06/01/16 211 lb (95.7 kg)  02/24/16 211 lb 9.6 oz (96 kg)    She was last seen for hypothyroid 3 months ago.  Management since that visit includes continue Levothyroxine 50 mcg, recommend referral to endocrinology. She reports excellent compliance with treatment. She is not having side effects.  She is exercising. She is experiencing hot flashes She denies none Weight trend: stable  ------------------------------------------------------------------------ Past Medical History:  Diagnosis Date  . CHF (congestive heart failure) ([place])   . Diabetes mellitus   . Edema   . Heart murmur   . Hypertension   . Thyroid disease    Past Surgical History:  Procedure Laterality Date  . ABDOMINAL HYSTERECTOMY  11/17/2013  . BREAST BIOPSY     x2  . CESAREAN SECTION     x 2  . CHOLECYSTECTOMY  2003   DUE TO STONES  . DILATION AND CURETTAGE OF UTERUS    . ELBOW  SURGERY    . POLYPECTOMY  1995   Family History  Problem Relation Age of Onset  . Hyperlipidemia Mother   . Thyroid disease Mother   . Hashimoto's thyroiditis Mother   . Heart disease Father     open heart surgery  . Heart failure Father   . Diabetes Father   . Dementia Father   . Thyroid disease Brother   . Breast  cancer Maternal Grandmother   . Diabetes Paternal Grandmother   . Prostate cancer Paternal Grandfather    No Known Allergies   Previous Medications   ALBUTEROL (PROVENTIL HFA;VENTOLIN HFA) 108 (90 BASE) MCG/ACT INHALER    Inhale 2 puffs into the lungs every 6 (six) hours as needed for wheezing or shortness of breath.   ASPIRIN 81 MG EC TABLET    Take 81 mg by mouth at bedtime.    AZITHROMYCIN (ZITHROMAX) 250 MG TABLET    Take 1 a day for 4 days   CHOLECALCIFEROL (VITAMIN D3) 1000 UNITS CAPS    Take 1 capsule by mouth every morning.    FISH OIL-OMEGA-3 FATTY ACIDS 1000 MG CAPSULE    Take 1 g by mouth 2 (two) times daily.    FUROSEMIDE (LASIX) 40 MG TABLET    Take 1 tablet (40 mg total) by mouth daily.   GLUCOSAMINE 500 MG CAPS    Take 500 mg by mouth 2 (two) times daily.   GLYBURIDE (DIABETA) 5 MG TABLET    Take 1 tablet (5 mg total) by mouth 2 (two) times daily with a meal.   LEVOTHYROXINE (SYNTHROID, LEVOTHROID) 50 MCG TABLET    Take 1 tablet (50 mcg total) by mouth daily.   LISINOPRIL-HYDROCHLOROTHIAZIDE (PRINZIDE,ZESTORETIC) 20-25 MG TABLET    Take 1 tablet by mouth daily.   METFORMIN (GLUMETZA) 1000 MG (MOD) 24 HR TABLET    Take 1 tablet (1,000 mg total) by mouth 2 (two) times daily with a meal.   METOPROLOL TARTRATE (LOPRESSOR) 25 MG TABLET    Take 1 tablet (25 mg total) by mouth 2 (two) times daily.   MULTIPLE VITAMIN (MULTIVITAMIN) TABLET    Take 1 tablet by mouth every morning.    PAROXETINE (PAXIL) 20 MG TABLET    Take 1 tablet (20 mg total) by mouth daily.   PRAVASTATIN (PRAVACHOL) 40 MG TABLET    TAKE ONE TABLET BY MOUTH ONCE DAILY   VICTOZA 18 MG/3ML SOPN    Inject 1.2mg  subcutaneously daily    Review of Systems  Constitutional: Negative.   Respiratory: Negative.   Cardiovascular: Negative.   Endocrine: Negative.   Musculoskeletal: Negative.     Social History  Substance Use Topics  . Smoking status: Former Smoker    Packs/day: 1.50    Years: 25.00    Types:  Cigarettes    Quit date: 11/24/2001  . Smokeless tobacco: Never Used  . Alcohol use 0.0 oz/week     Comment: OCCASIONALLY DRINKS WINE, ONCE A WEEK   Objective:   BP 126/78 (BP Location: Right Arm, Patient Position: Sitting, Cuff Size: Large)   Pulse 74   Temp 98.3 F (36.8 C) (Oral)   Wt 205 lb 3.2 oz (93.1 kg)   BMI 36.35 kg/m   Physical Exam  Constitutional: She is oriented to person, place, and time. She appears well-developed and well-nourished. No distress.  HENT:  Head: Normocephalic and atraumatic.  Right Ear: Hearing and external ear normal.  Left Ear: Hearing and external ear normal.  Nose: Nose normal.  Eyes: Conjunctivae and lids are normal. Right eye exhibits no discharge. Left eye exhibits no discharge. No scleral icterus.  Neck: Normal range of motion. Neck supple. No thyromegaly present.  Cardiovascular: Normal rate and regular rhythm.   Murmur heard. Pulmonary/Chest: Effort normal and breath sounds normal. No respiratory distress.  Abdominal: Soft. Bowel sounds are normal.  Musculoskeletal: Normal range of motion. She exhibits no edema.  Neurological: She is alert and oriented to person, place, and time.  Skin: Skin is intact. No lesion and no rash noted.  Psychiatric: She has a normal mood and affect. Her speech is normal and behavior is normal. Thought content normal.      Assessment & Plan:     1. CAP (community acquired pneumonia) Treated at ER on 08-19-16 with Azithromycin and Albuterol inhaler. Feeling much improved and lungs are clear. Will recheck labs and follow up as neede.  - CBC with Differential/Platelet - Comprehensive metabolic panel  2. Acute on chronic combined systolic and diastolic CHF (congestive heart failure) (HCC) BNP was up to 107 in the ER with CAP. Has been using Lasix daily and finds edema controlled. Slight recurrence each afternoon or evening. Has lost 3-4 lbs since the last OV here. Recommend recheck with Dr. Rockey Situ (her  cardiologist).  3. Aortic valve stenosis Murmur still present along the right upper sternal border. Recommend follow up with cardiologist and recheck of echocardiogram.  4. Essential (primary) hypertension Stable on the Metoprolol and Zestoretic regimen. Recheck labs. - CBC with Differential/Platelet - Comprehensive metabolic panel  5. Diabetic peripheral neuropathy associated with type 2 diabetes mellitus (HCC) Tolerating Metformin, Glyburide and Victoza. States she does not check FBS regularly. Has noticed some readings in the 200's since starting the Lasix daily. Will recheck labs and follow up with endocrinologist (Dr. Gabriel Carina). - CBC with Differential/Platelet - Comprehensive metabolic panel - Hemoglobin A1c  6. Hyperlipidemia, mixed Tolerating Pravastatin and trying to follow low fat diet. Encouraged to exercise as able 3-4 days a week. Recheck lipids and CMP. - Comprehensive metabolic panel - Lipid panel  7. Hx of iron deficiency Taking a multivitamin daily but no specific iron supplement. Will recheck blood levels. - CBC with Differential/Platelet - Ferritin `

## 2016-09-01 LAB — COMPREHENSIVE METABOLIC PANEL
ALBUMIN: 4.6 g/dL (ref 3.5–5.5)
ALK PHOS: 83 IU/L (ref 39–117)
ALT: 50 IU/L — ABNORMAL HIGH (ref 0–32)
AST: 54 IU/L — AB (ref 0–40)
Albumin/Globulin Ratio: 1.7 (ref 1.2–2.2)
BUN / CREAT RATIO: 21 (ref 9–23)
BUN: 15 mg/dL (ref 6–24)
Bilirubin Total: 0.4 mg/dL (ref 0.0–1.2)
CALCIUM: 9.7 mg/dL (ref 8.7–10.2)
CO2: 28 mmol/L (ref 18–29)
CREATININE: 0.7 mg/dL (ref 0.57–1.00)
Chloride: 94 mmol/L — ABNORMAL LOW (ref 96–106)
GFR calc Af Amer: 112 mL/min/{1.73_m2} (ref >59)
GFR, EST NON AFRICAN AMERICAN: 97 mL/min/{1.73_m2} (ref >59)
GLOBULIN, TOTAL: 2.7 g/dL (ref 1.5–4.5)
GLUCOSE: 175 mg/dL — AB (ref 65–99)
Potassium: 4.4 mmol/L (ref 3.5–5.2)
SODIUM: 142 mmol/L (ref 134–144)
Total Protein: 7.3 g/dL (ref 6.0–8.5)

## 2016-09-01 LAB — CBC WITH DIFFERENTIAL/PLATELET
BASOS ABS: 0 10*3/uL (ref 0.0–0.2)
Basos: 0 %
EOS (ABSOLUTE): 0.2 10*3/uL (ref 0.0–0.4)
EOS: 2 %
HEMATOCRIT: 37.3 % (ref 34.0–46.6)
HEMOGLOBIN: 12.2 g/dL (ref 11.1–15.9)
IMMATURE GRANULOCYTES: 1 %
Immature Grans (Abs): 0 10*3/uL (ref 0.0–0.1)
LYMPHS: 32 %
Lymphocytes Absolute: 2.1 10*3/uL (ref 0.7–3.1)
MCH: 25.2 pg — ABNORMAL LOW (ref 26.6–33.0)
MCHC: 32.7 g/dL (ref 31.5–35.7)
MCV: 77 fL — ABNORMAL LOW (ref 79–97)
MONOCYTES: 5 %
Monocytes Absolute: 0.3 10*3/uL (ref 0.1–0.9)
NEUTROS PCT: 60 %
Neutrophils Absolute: 4 10*3/uL (ref 1.4–7.0)
Platelets: 196 10*3/uL (ref 150–379)
RBC: 4.84 x10E6/uL (ref 3.77–5.28)
RDW: 14.5 % (ref 12.3–15.4)
WBC: 6.5 10*3/uL (ref 3.4–10.8)

## 2016-09-01 LAB — LIPID PANEL
Chol/HDL Ratio: 4 ratio units (ref 0.0–4.4)
Cholesterol, Total: 125 mg/dL (ref 100–199)
HDL: 31 mg/dL — AB (ref >39)
LDL Calculated: 62 mg/dL (ref 0–99)
TRIGLYCERIDES: 158 mg/dL — AB (ref 0–149)
VLDL CHOLESTEROL CAL: 32 mg/dL (ref 5–40)

## 2016-09-01 LAB — HEMOGLOBIN A1C
ESTIMATED AVERAGE GLUCOSE: 169 mg/dL
HEMOGLOBIN A1C: 7.5 % — AB (ref 4.8–5.6)

## 2016-09-01 LAB — FERRITIN: FERRITIN: 119 ng/mL (ref 15–150)

## 2016-09-03 ENCOUNTER — Telehealth: Payer: Self-pay

## 2016-09-03 NOTE — Telephone Encounter (Signed)
Pt is returning call.  CB#[phone] [id number]

## 2016-09-03 NOTE — Telephone Encounter (Signed)
-----   Message from [place], [place] sent at 09/03/2016  8:31 AM EDT ----- Iron levels and hgb back to normal. Blood sugar elevated and Hgb A1C is higher. Need to be sure Dr. Gabriel Carina sees this report and has a chance to adjust diabetes medications.

## 2016-09-03 NOTE — Telephone Encounter (Signed)
Pt advised.   Thanks,   -Kandy Towery  

## 2016-09-03 NOTE — Telephone Encounter (Signed)
LMTCB 09/03/2016  Thanks,   -Laura  

## 2016-09-04 ENCOUNTER — Other Ambulatory Visit: Payer: Self-pay | Admitting: Family Medicine

## 2016-09-04 DIAGNOSIS — I10 Essential (primary) hypertension: Secondary | ICD-10-CM

## 2016-09-04 MED ORDER — METOPROLOL TARTRATE 25 MG PO TABS: 25.0000 mg | ORAL_TABLET | Freq: Two times a day (BID) | ORAL | 3 refills | Status: DC

## 2016-10-01 ENCOUNTER — Other Ambulatory Visit: Payer: Self-pay

## 2016-10-01 MED ORDER — PRAVASTATIN SODIUM 40 MG PO TABS: 40.0000 mg | ORAL_TABLET | Freq: Every day | ORAL | 6 refills | Status: DC

## 2016-10-01 NOTE — Telephone Encounter (Signed)
Refill request received from patient e-mail requesting Pravastatin be sent to [place].

## 2016-10-02 ENCOUNTER — Ambulatory Visit: Payer: Managed Care, Other (non HMO) | Admitting: Cardiovascular Disease

## 2016-10-11 ENCOUNTER — Ambulatory Visit (INDEPENDENT_AMBULATORY_CARE_PROVIDER_SITE_OTHER): Payer: Managed Care, Other (non HMO) | Admitting: Cardiology

## 2016-10-11 ENCOUNTER — Encounter: Payer: Self-pay | Admitting: Cardiology

## 2016-10-11 VITALS — BP 116/60 | HR 70 | Ht 63.0 in | Wt 201.5 lb

## 2016-10-11 DIAGNOSIS — R002 Palpitations: Secondary | ICD-10-CM

## 2016-10-11 DIAGNOSIS — I10 Essential (primary) hypertension: Secondary | ICD-10-CM

## 2016-10-11 DIAGNOSIS — I509 Heart failure, unspecified: Secondary | ICD-10-CM

## 2016-10-11 NOTE — Progress Notes (Signed)
Cardiology Office Note   Date:  10/11/2016   ID:  Emily Tapia, DOB 1961/09/09, MRN [id number]  Referring Doctor:  Vernie Murders, PA   Cardiologist:   Wende Bushy, MD   Reason for consultation:  Chief Complaint  Patient presents with  . OTHER    LS 2012 c/o irregular heart beat, CHF and swelling. Meds reviewed verbally with pt.      History of Present Illness: Emily Tapia is a 55 y.o. female who presents for Evaluation for shortness of breath.  Patient was last seen in our practice in 2012. She was evaluated at that time for edema. Edema started to be related to Norvasc, improved with Lasix when necessary as well as withholding Norvasc. Also noted to have mild aortic stenosis on echocardiogram.  In September 2017, she presented to the ER with worsening shortness of breath and was diagnosed to have pneumonia and heart failure. Her blood pressure in the ear was high at 176/83. She has been taking Lasix now every day and seemed to improve her shortness of breath  In terms of shortness of breath, since taking the Lasix every day, that has improved. She has shortness of breath with minimal exertion. No chest pains. No lightheadedness, no passing out, no syncope.   She reports palpitations that are randomly occurring, not everyday, short episode of rapid heart rate. No passing out.   ROS:  Please see the history of present illness. Aside from mentioned under HPI, all other systems are reviewed and negative.     Past Medical History:  Diagnosis Date  . CHF (congestive heart failure) ([place])   . Diabetes mellitus   . Edema   . Heart murmur   . Hypertension   . Thyroid disease     Past Surgical History:  Procedure Laterality Date  . ABDOMINAL HYSTERECTOMY  11/17/2013  . BREAST BIOPSY     x2  . CESAREAN SECTION     x 2  . CHOLECYSTECTOMY  2003   DUE TO STONES  . DILATION AND CURETTAGE OF UTERUS    . ELBOW SURGERY    . POLYPECTOMY  1995     reports that she quit smoking about 14 years ago. Her smoking use included Cigarettes. She has a 37.50 pack-year smoking history. She has never used smokeless tobacco. She reports that she drinks alcohol. She reports that she does not use drugs.   family history includes Breast cancer in her maternal grandmother; Dementia in her father; Diabetes in her father and paternal grandmother; Hashimoto's thyroiditis in her mother; Heart disease in her father; Heart failure in her father; Hyperlipidemia in her mother; Prostate cancer in her paternal grandfather; Thyroid disease in her brother and mother.   Outpatient Medications Prior to Visit  Medication Sig Dispense Refill  . aspirin 81 MG EC tablet Take 81 mg by mouth at bedtime.     . Cholecalciferol (VITAMIN D3) 1000 UNITS CAPS Take 1 capsule by mouth every morning.     . fish oil-omega-3 fatty acids 1000 MG capsule Take 1 g by mouth 2 (two) times daily.     . furosemide (LASIX) 40 MG tablet Take 1 tablet (40 mg total) by mouth daily. (Patient taking differently: Take 40 mg by mouth daily as needed for fluid or edema. ) 30 tablet 1  . Glucosamine 500 MG CAPS Take 500 mg by mouth 2 (two) times daily.    Marland Kitchen glyBURIDE (DIABETA) 5 MG tablet Take 1 tablet (5 mg  total) by mouth 2 (two) times daily with a meal. 60 tablet 6  . lisinopril-hydrochlorothiazide (PRINZIDE,ZESTORETIC) 20-25 MG tablet Take 1 tablet by mouth daily. 90 tablet 0  . metFORMIN (GLUMETZA) 1000 MG (MOD) 24 hr tablet Take 1 tablet (1,000 mg total) by mouth 2 (two) times daily with a meal. 180 tablet 3  . metoprolol tartrate (LOPRESSOR) 25 MG tablet Take 1 tablet (25 mg total) by mouth 2 (two) times daily. 180 tablet 3  . Multiple Vitamin (MULTIVITAMIN) tablet Take 1 tablet by mouth every morning.     Marland Kitchen PARoxetine (PAXIL) 20 MG tablet Take 1 tablet (20 mg total) by mouth daily. (Patient taking differently: Take 20 mg by mouth every morning. ) 30 tablet 6  . pravastatin (PRAVACHOL) 40 MG tablet  Take 1 tablet (40 mg total) by mouth daily. 30 tablet 6  . VICTOZA 18 MG/3ML SOPN Inject 1.2mg  subcutaneously daily (Patient taking differently: Inject 1.2mg  subcutaneously daily in the morning.) 18 mL 4  . albuterol (PROVENTIL HFA;VENTOLIN HFA) 108 (90 Base) MCG/ACT inhaler Inhale 2 puffs into the lungs every 6 (six) hours as needed for wheezing or shortness of breath. (Patient not taking: Reported on 10/11/2016) 1 Inhaler 2  . azithromycin (ZITHROMAX) 250 MG tablet Take 1 a day for 4 days (Patient not taking: Reported on 10/11/2016) 4 each 0  . levothyroxine (SYNTHROID, LEVOTHROID) 50 MCG tablet Take 1 tablet (50 mcg total) by mouth daily. (Patient not taking: Reported on 10/11/2016) 90 tablet 3   No facility-administered medications prior to visit.      Allergies: Review of patient's allergies indicates no known allergies.    PHYSICAL EXAM: VS:  BP 116/60 (BP Location: Right Arm, Patient Position: Sitting, Cuff Size: Normal)   Pulse 70   Ht 5\' 3"  (1.6 m)   Wt 201 lb 8 oz (91.4 kg)   BMI 35.69 kg/m  , Body mass index is 35.69 kg/m. Wt Readings from Last 3 Encounters:  10/11/16 201 lb 8 oz (91.4 kg)  08/31/16 205 lb 3.2 oz (93.1 kg)  08/19/16 209 lb (94.8 kg)    GENERAL:  well developed, well nourished, obese, not in acute distress HEENT: normocephalic, pink conjunctivae, anicteric sclerae, no xanthelasma, normal dentition, oropharynx clear NECK:  no neck vein engorgement, JVP normal, no hepatojugular reflux, carotid upstroke brisk and symmetric, no bruit, no thyromegaly, no lymphadenopathy LUNGS:  good respiratory effort, clear to auscultation bilaterally CV:  PMI not displaced, no thrills, no lifts, S1 and S2 within normal limits, no palpable S3 or S4, 3/6 systolic ejection murmur present with a as no delay in carotid or radial pulse, no rubs, no gallops ABD:  Soft, nontender, nondistended, normoactive bowel sounds, no abdominal aortic bruit, no hepatomegaly, no splenomegaly MS:  nontender back, no kyphosis, no scoliosis, no joint deformities EXT:  2+ DP/PT pulses, no edema, no varicosities, no cyanosis, no clubbing SKIN: warm, nondiaphoretic, normal turgor, no ulcers NEUROPSYCH: alert, oriented to person, place, and time, sensory/motor grossly intact, normal mood, appropriate affect  Recent Labs: 06/01/2016: TSH 5.520 08/19/2016: B Natriuretic Peptide 107.0; Hemoglobin 11.5 08/31/2016: ALT 50; BUN 15; Creatinine, Ser 0.70; Platelets 196; Potassium 4.4; Sodium 142   Lipid Panel    Component Value Date/Time   CHOL 125 08/31/2016 0910   TRIG 158 (H) 08/31/2016 0910   HDL 31 (L) 08/31/2016 0910   CHOLHDL 4.0 08/31/2016 0910   LDLCALC 62 08/31/2016 0910     Other studies Reviewed:  EKG:  The ekg from 10/11/2016 was  personally reviewed by me and it revealed sinus rhythm, 70 BPM. Poor R-wave progression.  Additional studies/ records that were reviewed personally reviewed by me today include: Echo 04/05/2011: Left ventricle: The cavity size was normal. There was mild concentric hypertrophy. Systolic function was normal. The estimated ejection fraction was in the range of 55% to 65%. Wall motion was normal; there were no regional wall motion abnormalities. Left ventricular diastolic function parameters were normal. - Aortic valve: Transvalvular velocity was increased. There was borderline to mild stenosis. Valve area: 1.84cm^2(VTI). Valve area: 1.6cm^2 (Vmax). Impressions:  - Challenging image quality. Aortic valve is not well visualized though doppler signal suggests mild aortic valve disease, otherwise a normal study   ASSESSMENT AND PLAN: Shortness of breath possibly CHF, diastolic dysfunction, chronic Aortic stenosis Palpitations  Recommend to do an echocardiogram to assess severity of aortic stenosis now. If it is not severe, proceed with pharmacologic nuclear stress test. Recommend the monitor for the palpitations  HTN BP is  well controlled. Continue monitoring BP. Continue current medical therapy and lifestyle changes.    Current medicines are reviewed at length with the patient today.  The patient does not have concerns regarding medicines.  Labs/ tests ordered today include: Orders Placed This Encounter  Procedures  . EKG 12-Lead    I had a lengthy and detailed discussion with the patient regarding diagnoses, prognosis, diagnostic options, treatment options , and side effects of medications.   I counseled the patient on importance of lifestyle modification including heart healthy diet, regular physical activity .  I spent at least 60 minutes with the patient today and more than 50% of the time was spent counseling the patient and coordinating care.   Disposition:   FU with undersigned in 3 months   Signed, Wende Bushy, MD  10/11/2016 3:20 PM    [place]  This note was generated in part with voice recognition software and I apologize for any typographical errors that were not detected and corrected.

## 2016-10-11 NOTE — Patient Instructions (Addendum)
We will do echocardiogram first and once we receive results then we will schedule your stress test.  Testing/Procedures: Your physician has requested that you have an echocardiogram. Echocardiography is a painless test that uses sound waves to create images of your heart. It provides your doctor with information about the size and shape of your heart and how well your heart's chambers and valves are working. This procedure takes approximately one hour. There are no restrictions for this procedure.  [place]  Your caregiver has ordered a Stress Test with nuclear imaging. The purpose of this test is to evaluate the blood supply to your heart muscle. This procedure is referred to as a "Non-Invasive Stress Test." This is because other than having an IV started in your vein, nothing is inserted or "invades" your body. Cardiac stress tests are done to find areas of poor blood flow to the heart by determining the extent of coronary artery disease (CAD). Some patients exercise on a treadmill, which naturally increases the blood flow to your heart, while others who are  unable to walk on a treadmill due to physical limitations have a pharmacologic/chemical stress agent called Lexiscan . This medicine will mimic walking on a treadmill by temporarily increasing your coronary blood flow.   Please note: these test may take anywhere between 2-4 hours to complete  PLEASE REPORT TO [place] AT THE FIRST DESK WILL DIRECT YOU WHERE TO GO  Date of Procedure:_____________________________________  Arrival Time for Procedure:______________________________  Instructions regarding medication:   __X__ : Hold diabetes medication morning of procedure. Hold Metformin (Glucophage), Glyburide (Diabeta), and Victozia injection  __X__:  Hold Metoprolol the night before procedure and morning of procedure  __X__:  Hold Lasix (Furosemide) and Lisionopril-Hydrochlorothiazide until after  testing.   PLEASE NOTIFY THE OFFICE AT LEAST 64 HOURS IN ADVANCE IF YOU ARE UNABLE TO KEEP YOUR APPOINTMENT.  [phone] AND  PLEASE NOTIFY NUCLEAR MEDICINE AT [facility] AT LEAST 24 HOURS IN ADVANCE IF YOU ARE UNABLE TO KEEP YOUR APPOINTMENT. [phone]  How to prepare for your Myoview test:  1. Do not eat or drink after midnight 2. No caffeine for 24 hours prior to test 3. No smoking 24 hours prior to test. 4. Your medication may be taken with water.  If your doctor stopped a medication because of this test, do not take that medication. 5. Ladies, please do not wear dresses.  Skirts or pants are appropriate. Please wear a short sleeve shirt. 6. No perfume, cologne or lotion. 7. Wear comfortable walking shoes. No heels!    Follow-Up: Your physician recommends that you schedule a follow-up appointment in: 3 months with Dr. Yvone Neu.   It was a pleasure seeing you today here in the office. Please do not hesitate to give [place] a call back if you have any further questions. [place], BSN      Echocardiogram An echocardiogram, or echocardiography, uses sound waves (ultrasound) to produce an image of your heart. The echocardiogram is simple, painless, obtained within a short period of time, and offers valuable information to your health care provider. The images from an echocardiogram can provide information such as:  Evidence of coronary artery disease (CAD).  Heart size.  Heart muscle function.  Heart valve function.  Aneurysm detection.  Evidence of a past heart attack.  Fluid buildup around the heart.  Heart muscle thickening.  Assess heart valve function. LET [facility] CARE PROVIDER KNOW ABOUT:  Any allergies  you have.  All medicines you are taking, including vitamins, herbs, eye drops, creams, and over-the-counter medicines.  Previous problems you or members of your family have had with the use of anesthetics.  Any blood disorders you  have.  Previous surgeries you have had.  Medical conditions you have.  Possibility of pregnancy, if this applies. BEFORE THE PROCEDURE  No special preparation is needed. Eat and drink normally.  PROCEDURE   In order to produce an image of your heart, gel will be applied to your chest and a wand-like tool (transducer) will be moved over your chest. The gel will help transmit the sound waves from the transducer. The sound waves will harmlessly bounce off your heart to allow the heart images to be captured in real-time motion. These images will then be recorded.  You may need an IV to receive a medicine that improves the quality of the pictures. AFTER THE PROCEDURE You may return to your normal schedule including diet, activities, and medicines, unless your health care provider tells you otherwise.   This information is not intended to replace advice given to you by your health care provider. Make sure you discuss any questions you have with your health care provider.   Document Released: 11/30/2000 Document Revised: 12/24/2014 Document Reviewed: 08/10/2013 Elsevier Interactive Patient Education 2016 [employer].    Pharmacologic Stress Electrocardiogram A pharmacologic stress electrocardiogram is a heart (cardiac) test that uses nuclear imaging to evaluate the blood supply to your heart. This test may also be called a pharmacologic stress electrocardiography. Pharmacologic means that a medicine is used to increase your heart rate and blood pressure.  This stress test is done to find areas of poor blood flow to the heart by determining the extent of coronary artery disease (CAD). Some people exercise on a treadmill, which naturally increases the blood flow to the heart. For those people unable to exercise on a treadmill, a medicine is used. This medicine stimulates your heart and will cause your heart to beat harder and more quickly, as if you were exercising.  Pharmacologic stress tests  can help determine:  The adequacy of blood flow to your heart during increased levels of activity in order to clear you for discharge home.  The extent of coronary artery blockage caused by CAD.  Your prognosis if you have suffered a heart attack.  The effectiveness of cardiac procedures done, such as an angioplasty, which can increase the circulation in your coronary arteries.  Causes of chest pain or pressure. LET [facility] CARE PROVIDER KNOW ABOUT:  Any allergies you have.  All medicines you are taking, including vitamins, herbs, eye drops, creams, and over-the-counter medicines.  Previous problems you or members of your family have had with the use of anesthetics.  Any blood disorders you have.  Previous surgeries you have had.  Medical conditions you have.  Possibility of pregnancy, if this applies.  If you are currently breastfeeding. RISKS AND COMPLICATIONS Generally, this is a safe procedure. However, as with any procedure, complications can occur. Possible complications include:  You develop pain or pressure in the following areas:  Chest.  Jaw or neck.  Between your shoulder blades.  Radiating down your left arm.  Headache.  Dizziness or light-headedness.  Shortness of breath.  Increased or irregular heartbeat.  Low blood pressure.  Nausea or vomiting.  Flushing.  Redness going up the arm and slight pain during injection of medicine.  Heart attack (rare). BEFORE THE PROCEDURE   Avoid all  forms of caffeine for 24 hours before your test or as directed by your health care provider. This includes coffee, tea (even decaffeinated tea), caffeinated sodas, chocolate, cocoa, and certain pain medicines.  Follow your health care provider's instructions regarding eating and drinking before the test.  Take your medicines as directed at regular times with water unless instructed otherwise. Exceptions may include:  If you have diabetes, ask how you are  to take your insulin or pills. It is common to adjust insulin dosing the morning of the test.  If you are taking beta-blocker medicines, it is important to talk to your health care provider about these medicines well before the date of your test. Taking beta-blocker medicines may interfere with the test. In some cases, these medicines need to be changed or stopped 24 hours or more before the test.  If you wear a nitroglycerin patch, it may need to be removed prior to the test. Ask your health care provider if the patch should be removed before the test.  If you use an inhaler for any breathing condition, bring it with you to the test.  If you are an outpatient, bring a snack so you can eat right after the stress phase of the test.  Do not smoke for 4 hours prior to the test or as directed by your health care provider.  Do not apply lotions, powders, creams, or oils on your chest prior to the test.  Wear comfortable shoes and clothing. Let your health care provider know if you were unable to complete or follow the preparations for your test. PROCEDURE   Multiple patches (electrodes) will be put on your chest. If needed, small areas of your chest may be shaved to get better contact with the electrodes. Once the electrodes are attached to your body, multiple wires will be attached to the electrodes, and your heart rate will be monitored.  An IV access will be started. A nuclear trace (isotope) is given. The isotope may be given intravenously, or it may be swallowed. Nuclear refers to several types of radioactive isotopes, and the nuclear isotope lights up the arteries so that the nuclear images are clear. The isotope is absorbed by your body. This results in low radiation exposure.  A resting nuclear image is taken to show how your heart functions at rest.  A medicine is given through the IV access.  A second scan is done about 1 hour after the medicine injection and determines how your heart  functions under stress.  During this stress phase, you will be connected to an electrocardiogram machine. Your blood pressure and oxygen levels will be monitored. AFTER THE PROCEDURE   Your heart rate and blood pressure will be monitored after the test.  You may return to your normal schedule, including diet,activities, and medicines, unless your health care provider tells you otherwise.   This information is not intended to replace advice given to you by your health care provider. Make sure you discuss any questions you have with your health care provider.   Document Released: 04/21/2009 Document Revised: 12/08/2013 Document Reviewed: 08/10/2013 Elsevier Interactive Patient Education [employer].

## 2016-10-16 ENCOUNTER — Other Ambulatory Visit: Payer: Self-pay | Admitting: Family Medicine

## 2016-10-16 ENCOUNTER — Other Ambulatory Visit: Payer: Self-pay | Admitting: *Deleted

## 2016-10-16 ENCOUNTER — Telehealth: Payer: Self-pay | Admitting: Cardiology

## 2016-10-16 DIAGNOSIS — R609 Edema, unspecified: Secondary | ICD-10-CM

## 2016-10-16 DIAGNOSIS — R002 Palpitations: Secondary | ICD-10-CM

## 2016-10-16 MED ORDER — FUROSEMIDE 40 MG PO TABS: 40.0000 mg | ORAL_TABLET | Freq: Every day | ORAL | 1 refills | Status: DC | PRN

## 2016-10-16 NOTE — Telephone Encounter (Signed)
Spoke with patient and let her know that Dr. Yvone Neu wanted her to wear the zio monitor. She is coming in for echocardiogram and let her know that we would put it on after that has been done. She verbalized understanding and had no further questions at this time.

## 2016-10-16 NOTE — Telephone Encounter (Signed)
Left voicemail message for patient to call back to schedule monitor placement.

## 2016-10-16 NOTE — Telephone Encounter (Signed)
Done

## 2016-10-22 ENCOUNTER — Other Ambulatory Visit: Payer: Self-pay | Admitting: Cardiology

## 2016-10-22 DIAGNOSIS — I509 Heart failure, unspecified: Secondary | ICD-10-CM

## 2016-10-22 DIAGNOSIS — I350 Nonrheumatic aortic (valve) stenosis: Secondary | ICD-10-CM

## 2016-10-22 DIAGNOSIS — R002 Palpitations: Secondary | ICD-10-CM

## 2016-10-29 ENCOUNTER — Telehealth: Payer: Self-pay

## 2016-10-29 MED ORDER — PAROXETINE HCL 20 MG PO TABS: 20.0000 mg | ORAL_TABLET | Freq: Every day | ORAL | 6 refills | Status: DC

## 2016-10-29 NOTE — Telephone Encounter (Signed)
Refill request received from patient email requesting Paroxetine 20 mg.

## 2016-10-29 NOTE — Telephone Encounter (Signed)
Pharmacy is [facility]

## 2016-10-29 NOTE — Telephone Encounter (Signed)
LMTCB

## 2016-10-29 NOTE — Telephone Encounter (Signed)
Which pharmacy?

## 2016-11-02 ENCOUNTER — Other Ambulatory Visit: Payer: Self-pay

## 2016-11-02 ENCOUNTER — Ambulatory Visit (INDEPENDENT_AMBULATORY_CARE_PROVIDER_SITE_OTHER): Payer: Managed Care, Other (non HMO)

## 2016-11-02 DIAGNOSIS — R002 Palpitations: Secondary | ICD-10-CM | POA: Diagnosis not present

## 2016-11-02 DIAGNOSIS — I509 Heart failure, unspecified: Secondary | ICD-10-CM

## 2016-11-02 DIAGNOSIS — I350 Nonrheumatic aortic (valve) stenosis: Secondary | ICD-10-CM

## 2016-11-05 ENCOUNTER — Telehealth: Payer: Self-pay | Admitting: *Deleted

## 2016-11-05 ENCOUNTER — Encounter: Payer: Self-pay | Admitting: *Deleted

## 2016-11-05 DIAGNOSIS — R0602 Shortness of breath: Secondary | ICD-10-CM

## 2016-11-05 NOTE — Telephone Encounter (Signed)
-----   Message from Wende Bushy, MD sent at 11/05/2016  9:43 AM EST ----- LVEF okay. Aortic stenosis has progressed from mild to moderate now. As per office note, we can set her up for pharmacologic nuclear stress test.

## 2016-11-05 NOTE — Telephone Encounter (Signed)
Spoke with patient and reviewed results of echocardiogram and also set up stress test for her. Reviewed all instructions and will print out those along with information on stress testing for her to pick up. She states that she will come by the office to pick up that information and has no further questions at this time.

## 2016-11-05 NOTE — Telephone Encounter (Signed)
Left voicemail message to call back  

## 2016-11-09 ENCOUNTER — Encounter
Admission: RE | Admit: 2016-11-09 | Discharge: 2016-11-09 | Disposition: A | Discharge status: Home / Self Care | Payer: Managed Care, Other (non HMO) | Source: Ambulatory Visit | Attending: Cardiology | Admitting: Cardiology

## 2016-11-09 DIAGNOSIS — I509 Heart failure, unspecified: Secondary | ICD-10-CM | POA: Diagnosis not present

## 2016-11-09 DIAGNOSIS — I10 Essential (primary) hypertension: Secondary | ICD-10-CM | POA: Diagnosis not present

## 2016-11-09 DIAGNOSIS — R002 Palpitations: Secondary | ICD-10-CM | POA: Diagnosis not present

## 2016-11-09 MED ORDER — TECHNETIUM TC 99M TETROFOSMIN IV KIT
10.0000 | PACK | Freq: Once | INTRAVENOUS | Status: AC | PRN
  Administered 2016-11-09: 13.25 via INTRAVENOUS

## 2016-11-09 MED ORDER — TECHNETIUM TC 99M TETROFOSMIN IV KIT: 30.0000 | PACK | Freq: Once | INTRAVENOUS | Status: DC | PRN

## 2016-11-09 MED ORDER — REGADENOSON 0.4 MG/5ML IV SOLN
0.4000 mg | Freq: Once | INTRAVENOUS | Status: DC
  Filled 2016-11-09: qty 5

## 2016-11-12 NOTE — Progress Notes (Signed)
Acknowledged.

## 2016-11-14 NOTE — Telephone Encounter (Signed)
See results note. Spoke with patient.

## 2016-11-14 NOTE — Telephone Encounter (Signed)
Patient calling to speak with Olin Hauser please call back.

## 2016-11-22 ENCOUNTER — Telehealth: Payer: Self-pay | Admitting: *Deleted

## 2016-11-22 MED ORDER — METOPROLOL TARTRATE 25 MG PO TABS: 25.0000 mg | ORAL_TABLET | Freq: Three times a day (TID) | ORAL | 6 refills | Status: DC

## 2016-11-22 NOTE — Telephone Encounter (Signed)
Reviewed results of monitor and recommendations to increase metoprolol to 25 mg three times a day. Instructed her to monitor her blood pressures as well. She verbalized understanding of our conversation and had no further questions at this time.

## 2016-11-22 NOTE — Telephone Encounter (Signed)
-----   Message from Wende Bushy, MD sent at 11/21/2016  3:07 PM EST ----- Episodes of SVT likely paroxysmal atrial tachycardia. Some of her symptoms correlated with this. May do trial of increasing metoprolol 25 mg to  3 times a day from bid

## 2016-12-03 ENCOUNTER — Other Ambulatory Visit: Payer: Self-pay | Admitting: Family Medicine

## 2016-12-04 MED ORDER — LEVOTHYROXINE SODIUM 75 MCG PO TABS: 75.0000 ug | ORAL_TABLET | Freq: Every day | ORAL | 3 refills | Status: DC

## 2016-12-04 NOTE — Telephone Encounter (Signed)
Last ov 08/31/16

## 2016-12-04 NOTE — Telephone Encounter (Signed)
Pt contacted office for refill request on the following medications:  [facility] mail order.  [id number]  levothyroxine (SYNTHROID, LEVOTHROID) 75 MCG tablet  VICTOZA 18 MG/3ML SOPN

## 2016-12-25 ENCOUNTER — Other Ambulatory Visit: Payer: Self-pay | Admitting: Family Medicine

## 2016-12-25 MED ORDER — FUROSEMIDE 40 MG PO TABS: 40.0000 mg | ORAL_TABLET | Freq: Every day | ORAL | 1 refills | Status: DC | PRN

## 2017-01-15 ENCOUNTER — Encounter (INDEPENDENT_AMBULATORY_CARE_PROVIDER_SITE_OTHER): Payer: Self-pay

## 2017-01-15 ENCOUNTER — Ambulatory Visit (INDEPENDENT_AMBULATORY_CARE_PROVIDER_SITE_OTHER): Payer: Managed Care, Other (non HMO) | Admitting: Cardiology

## 2017-01-15 ENCOUNTER — Encounter: Payer: Self-pay | Admitting: Cardiology

## 2017-01-15 VITALS — BP 150/72 | HR 72 | Ht 63.0 in | Wt 205.5 lb

## 2017-01-15 DIAGNOSIS — Z23 Encounter for immunization: Secondary | ICD-10-CM | POA: Diagnosis not present

## 2017-01-15 DIAGNOSIS — I10 Essential (primary) hypertension: Secondary | ICD-10-CM

## 2017-01-15 DIAGNOSIS — I471 Supraventricular tachycardia: Secondary | ICD-10-CM

## 2017-01-15 DIAGNOSIS — R0602 Shortness of breath: Secondary | ICD-10-CM

## 2017-01-15 NOTE — Patient Instructions (Signed)
Your physician has requested that you regularly monitor and record your blood pressure readings at home. Please use the same machine at the same time of day to check your readings and record them to bring to your follow-up visit.  Please call if your blood pressures remain greater than 140/80   Follow-Up: Your physician wants you to follow-up in: 6 months with Dr. Yvone Neu. You will receive a reminder letter in the mail two months in advance. If you don't receive a letter, please call our office to schedule the follow-up appointment.  It was a pleasure seeing you today here in the office. Please do not hesitate to give [place] a call back if you have any further questions. [place], BSN

## 2017-01-15 NOTE — Progress Notes (Signed)
Cardiology Office Note   Date:  01/16/2017   ID:  Emily Tapia, DOB 1961-05-18, MRN [id number]  Referring Doctor:  Vernie Murders, PA   Cardiologist:   Wende Bushy, MD   Reason for consultation:  Chief Complaint  Patient presents with  . other    3 month follow up patient states she is SOB. Meds reviewed verbally with patient.       History of Present Illness: Emily Tapia is a 56 y.o. female who presents for ffup after testing  Per last visit note 10/11/2016:Patient was last seen in our practice in 2012. She was evaluated at that time for edema. Edema started to be related to Norvasc, improved with Lasix when necessary as well as withholding Norvasc. Also noted to have mild aortic stenosis on echocardiogram.  Since last visit, pt was not able to proceed with nuclear stress test due to severe claustrophobia. Her SOB persists as before.   No CP, palpitations, lightheadedness, syncope.  ROS:  Please see the history of present illness. Aside from mentioned under HPI, all other systems are reviewed and negative.    Past Medical History:  Diagnosis Date  . CHF (congestive heart failure) ([place])   . Diabetes mellitus   . Edema   . Heart murmur   . Hypertension   . Thyroid disease     Past Surgical History:  Procedure Laterality Date  . ABDOMINAL HYSTERECTOMY  11/17/2013  . BREAST BIOPSY     x2  . CESAREAN SECTION     x 2  . CHOLECYSTECTOMY  2003   DUE TO STONES  . DILATION AND CURETTAGE OF UTERUS    . ELBOW SURGERY    . POLYPECTOMY  1995     reports that she quit smoking about 15 years ago. Her smoking use included Cigarettes. She has a 37.50 pack-year smoking history. She has never used smokeless tobacco. She reports that she drinks alcohol. She reports that she does not use drugs.   family history includes Breast cancer in her maternal grandmother; Dementia in her father; Diabetes in her father and paternal grandmother; Hashimoto's thyroiditis  in her mother; Heart disease in her father; Heart failure in her father; Hyperlipidemia in her mother; Prostate cancer in her paternal grandfather; Thyroid disease in her brother and mother.   Outpatient Medications Prior to Visit  Medication Sig Dispense Refill  . aspirin 81 MG EC tablet Take 81 mg by mouth at bedtime.     . Cholecalciferol (VITAMIN D3) 1000 UNITS CAPS Take 1 capsule by mouth every morning.     . fish oil-omega-3 fatty acids 1000 MG capsule Take 1 g by mouth 2 (two) times daily.     . furosemide (LASIX) 40 MG tablet Take 1 tablet (40 mg total) by mouth daily as needed for fluid or edema. 30 tablet 1  . Glucosamine 500 MG CAPS Take 500 mg by mouth 2 (two) times daily.    Marland Kitchen glyBURIDE (DIABETA) 5 MG tablet Take 1 tablet (5 mg total) by mouth 2 (two) times daily with a meal. 60 tablet 6  . levothyroxine (SYNTHROID, LEVOTHROID) 75 MCG tablet Take 1 tablet (75 mcg total) by mouth daily before breakfast. 90 tablet 3  . lisinopril-hydrochlorothiazide (PRINZIDE,ZESTORETIC) 20-25 MG tablet Take 1 tablet by mouth daily. 90 tablet 0  . metFORMIN (GLUMETZA) 1000 MG (MOD) 24 hr tablet Take 1 tablet (1,000 mg total) by mouth 2 (two) times daily with a meal. 180 tablet 3  .  metoprolol tartrate (LOPRESSOR) 25 MG tablet Take 1 tablet (25 mg total) by mouth 3 (three) times daily. 90 tablet 6  . Multiple Vitamin (MULTIVITAMIN) tablet Take 1 tablet by mouth every morning.     Marland Kitchen PARoxetine (PAXIL) 20 MG tablet Take 1 tablet (20 mg total) by mouth daily. 30 tablet 6  . pravastatin (PRAVACHOL) 40 MG tablet Take 1 tablet (40 mg total) by mouth daily. 30 tablet 6  . VICTOZA 18 MG/3ML SOPN INJECT SUBCUTANEOUSLY 1.2MG  DAILY 18 mL 3   No facility-administered medications prior to visit.      Allergies: Patient has no known allergies.    PHYSICAL EXAM: VS:  BP (!) 150/72 (BP Location: Left Arm, Patient Position: Sitting, Cuff Size: Normal)   Pulse 72   Ht 5\' 3"  (1.6 m)   Wt 205 lb 8 oz (93.2 kg)    BMI 36.40 kg/m  , Body mass index is 36.4 kg/m. Wt Readings from Last 3 Encounters:  01/15/17 205 lb 8 oz (93.2 kg)  10/11/16 201 lb 8 oz (91.4 kg)  08/31/16 205 lb 3.2 oz (93.1 kg)    PHYSICAL EXAM: VS:  BP (!) 150/72 (BP Location: Left Arm, Patient Position: Sitting, Cuff Size: Normal)   Pulse 72   Ht 5\' 3"  (1.6 m)   Wt 205 lb 8 oz (93.2 kg)   BMI 36.40 kg/m  , BMI Body mass index is 36.4 kg/m. GENERAL:  well developed, well nourished, obese, not in acute distress HEENT: normocephalic, pink conjunctivae, anicteric sclerae, no xanthelasma, normal dentition, oropharynx clear NECK:  no neck vein engorgement, JVP normal, no hepatojugular reflux, carotid upstroke brisk and symmetric, no bruit, no thyromegaly, no lymphadenopathy LUNGS:  good respiratory effort, clear to auscultation bilaterally CV:  PMI not displaced, no thrills, no lifts, S1 and S2 within normal limits, no palpable S3 or S4, 3/6 systolic ejection murmur c/w AS, no delay in carotid or radial pulse, no rubs, no gallops ABD:  Soft, nontender, nondistended, normoactive bowel sounds, no abdominal aortic bruit, no hepatomegaly, no splenomegaly MS: nontender back, no kyphosis, no scoliosis, no joint deformities EXT:  2+ DP/PT pulses, no edema, no varicosities, no cyanosis, no clubbing SKIN: warm, nondiaphoretic, normal turgor, no ulcers NEUROPSYCH: alert, oriented to person, place, and time, sensory/motor grossly intact, normal mood, appropriate affect    Recent Labs: 06/01/2016: TSH 5.520 08/19/2016: B Natriuretic Peptide 107.0; Hemoglobin 11.5 08/31/2016: ALT 50; BUN 15; Creatinine, Ser 0.70; Platelets 196; Potassium 4.4; Sodium 142   Lipid Panel    Component Value Date/Time   CHOL 125 08/31/2016 0910   TRIG 158 (H) 08/31/2016 0910   HDL 31 (L) 08/31/2016 0910   CHOLHDL 4.0 08/31/2016 0910   LDLCALC 62 08/31/2016 0910     Other studies Reviewed:  EKG:  The ekg from 10/11/2016 was personally reviewed by me and it  revealed sinus rhythm, 70 BPM. Poor R-wave progression.  Additional studies/ records that were reviewed personally reviewed by me today include:   Echo 04/05/2011: Left ventricle: The cavity size was normal. There was mild concentric hypertrophy. Systolic function was normal. The estimated ejection fraction was in the range of 55% to 65%. Wall motion was normal; there were no regional wall motion abnormalities. Left ventricular diastolic function parameters were normal. - Aortic valve: Transvalvular velocity was increased. There was borderline to mild stenosis. Valve area: 1.84cm^2(VTI). Valve area: 1.6cm^2 (Vmax). Impressions:  - Challenging image quality. Aortic valve is not well visualized though doppler signal suggests mild aortic valve  disease, otherwise a normal study  Echo 11/02/2016: Left ventricle: The cavity size was normal. There was mild   concentric hypertrophy. Systolic function was normal. The   estimated ejection fraction was in the range of 60% to 65%. Wall   motion was normal; there were no regional wall motion   abnormalities. Left ventricular diastolic function parameters   were normal. - Aortic valve: Transvalvular velocity was increased. There was   moderate stenosis. Peak velocity (S): 380 cm/s. Mean gradient   (S): 30 mm Hg. Peak gradient (S): 60 mm Hg. Valve area (VTI):   1.13 cm^2. - Aortic root: The aortic root was normal in size. - Right ventricle: Systolic function was normal. - Pulmonary arteries: Systolic pressure was within the normal   range.  Event Monitor 11/21/2016: Overall rhythm was sinus. 44-118 bpm, average of 70 BPM.  14 episodes of SVT, likely paroxysmal atrial tachycardia. The heart rate ranged from 92-210 BPM. Fastest run was 9 beats at 210 BPM, 2.7 seconds. 6:21 AM 11/09/2016. Longest run was 20 beats, 13.4 seconds, 144 BPM. 11/06/2016 1916 p.m.  No other significant supraventricular ectopy. No high-grade  ventricular ectopy. No ventricular tachycardia. No pauses. No AV block. No evidence of atrial fibrillation.  Patient events correlated with the SVT episodes, sinus rhythm, supraventricular ectopic beats. Episodes of SVT likely paroxysmal atrial tachycardia. Some of her symptoms correlated with this. May do trial of increasing metoprolol 25 mg to 3 times a day from bid  ASSESSMENT AND PLAN: Shortness of breath  Diastolic function wnl on echo Rec to reorder lexiscan stress test, look for facility that offers the test with patient sitting up instead of lying down position to help deal with claustrophobia  Palpitations PAT/paroxysmal atrial tachycardia noted on holter Pt doing better on increased dose of Metoprolol  AS Moderate Proceed with stress test Continue to monitor  HTN Pt not monitoring BP at home. Encourage BP log. Pt to call if > 140/80. Continue current medical therapy and lifestyle changes.   Current medicines are reviewed at length with the patient today.  The patient does not have concerns regarding medicines.  Labs/ tests ordered today include:  Orders Placed This Encounter  Procedures  . Flu Vaccine QUAD 36+ mos IM    Disposition:   FU with undersigned in 6 mos or sooner, post test  Pt requested for flu vaccine.   Signed, Wende Bushy, MD  01/16/2017 11:01 AM    [place]  This note was generated in part with voice recognition software and I apologize for any typographical errors that were not detected and corrected.

## 2017-01-17 ENCOUNTER — Other Ambulatory Visit: Payer: Self-pay | Admitting: Family Medicine

## 2017-02-11 ENCOUNTER — Other Ambulatory Visit: Payer: Self-pay | Admitting: Family Medicine

## 2017-02-11 DIAGNOSIS — E119 Type 2 diabetes mellitus without complications: Secondary | ICD-10-CM

## 2017-03-15 ENCOUNTER — Encounter: Payer: Self-pay | Admitting: Family Medicine

## 2017-03-15 ENCOUNTER — Ambulatory Visit (INDEPENDENT_AMBULATORY_CARE_PROVIDER_SITE_OTHER): Payer: Managed Care, Other (non HMO) | Admitting: Family Medicine

## 2017-03-15 VITALS — BP 112/74 | HR 76 | Temp 98.0°F | Resp 16 | Wt 201.0 lb

## 2017-03-15 DIAGNOSIS — R1013 Epigastric pain: Secondary | ICD-10-CM

## 2017-03-15 DIAGNOSIS — R1901 Right upper quadrant abdominal swelling, mass and lump: Secondary | ICD-10-CM

## 2017-03-15 DIAGNOSIS — E1142 Type 2 diabetes mellitus with diabetic polyneuropathy: Secondary | ICD-10-CM

## 2017-03-15 NOTE — Patient Instructions (Signed)
Gastroparesis Gastroparesis, also called delayed gastric emptying, is a condition in which food takes longer than normal to empty from the stomach. The condition is usually long-lasting (chronic). What are the causes? This condition may be caused by:  An endocrine disorder, such as hypothyroidism or diabetes. Diabetes is the most common cause of this condition.  A nervous system disease, such as Parkinson disease or multiple sclerosis.  Cancer, infection, or surgery of the stomach or vagus nerve.  A connective tissue disorder, such as scleroderma.  Certain medicines. In most cases, the cause is not known. What increases the risk? This condition is more likely to develop in:  People with certain disorders, including endocrine disorders, eating disorders, amyloidosis, and scleroderma.  People with certain diseases, including Parkinson disease or multiple sclerosis.  People with cancer or infection of the stomach or vagus nerve.  People who have had surgery on the stomach or vagus nerve.  People who take certain medicines.  Women. What are the signs or symptoms? Symptoms of this condition include:  An early feeling of fullness when eating.  Nausea.  Weight loss.  Vomiting.  Heartburn.  Abdominal bloating.  Inconsistent blood glucose levels.  Lack of appetite.  Acid from the stomach coming up into the esophagus (gastroesophageal reflux).  Spasms of the stomach. Symptoms may come and go. How is this diagnosed? This condition is diagnosed with tests, such as:  Tests that check how long it takes food to move through the stomach and intestines. These tests include:  Upper gastrointestinal (GI) series. In this test, X-rays of the intestines are taken after you drink a liquid. The liquid makes the intestines show up better on the X-rays.  Gastric emptying scintigraphy. In this test, scans are taken after you eat food that contains a small amount of radioactive  material.  Wireless capsule GI monitoring system. This test involves swallowing a capsule that records information about movement through the stomach.  Gastric manometry. This test measures electrical and muscular activity in the stomach. It is done with a thin tube that is passed down the throat and into the stomach.  Endoscopy. This test checks for abnormalities in the lining of the stomach. It is done with a long, thin tube that is passed down the throat and into the stomach.  An ultrasound. This test can help rule out gallbladder disease or pancreatitis as a cause of your symptoms. It uses sound waves to take pictures of the inside of your body. How is this treated? There is no cure for gastroparesis. This condition may be managed with:  Treatment of the underlying condition causing the gastroparesis.  Lifestyle changes, including exercise and dietary changes. Dietary changes can include:  Changes in what and when you eat.  Eating smaller meals more often.  Eating low-fat foods.  Eating low-fiber forms of high-fiber foods, such as cooked vegetables instead of raw vegetables.  Having liquid foods in place of solid foods. Liquid foods are easier to digest.  Medicines. These may be given to control nausea and vomiting and to stimulate stomach muscles.  Getting food through a feeding tube. This may be done in severe cases.  A gastric neurostimulator. This is a device that is inserted into the body with surgery. It helps improve stomach emptying and control nausea and vomiting. Follow these instructions at home:  Follow your health care provider's instructions about exercise and diet.  Take medicines only as directed by your health care provider. Contact a health care provider if:  Your symptoms do not improve with treatment.  You have new symptoms. Get help right away if:  You have severe abdominal pain that does not improve with treatment.  You have nausea that does not  go away.  You cannot keep fluids down. This information is not intended to replace advice given to you by your health care provider. Make sure you discuss any questions you have with your health care provider. Document Released: 12/03/2005 Document Revised: 05/10/2016 Document Reviewed: 11/29/2014 Elsevier Interactive Patient Education  2017 [employer].

## 2017-03-15 NOTE — Progress Notes (Signed)
Patient: Emily Tapia Female    DOB: 04-Aug-1961   56 y.o.   MRN: [id number] Visit Date: 03/15/2017  Today's Provider: Vernie Murders, PA   Chief Complaint  Patient presents with  . Abdominal Pain   Subjective:    Abdominal Pain  This is a new problem. The current episode started more than 1 month ago (x 2 months). The onset quality is sudden. The pain is located in the periumbilical region. The pain is at a severity of 5/10. The pain is moderate. The quality of the pain is aching and cramping. The abdominal pain does not radiate. Associated symptoms include anorexia, belching, constipation, diarrhea ("leakage"), flatus, nausea and vomiting. Pertinent negatives include no arthralgias, dysuria, fever, frequency, headaches, hematochezia, hematuria, melena or myalgias. Associated symptoms comments: Pt also c/o dysphagia and reflux. The pain is aggravated by eating. The pain is relieved by palpation. Treatments tried: Rolaids. The treatment provided mild relief.   Patient Active Problem List   Diagnosis Date Noted  . Mass of pelvis 07/08/2015  . Hyperlipidemia, mixed 07/08/2015  . Hx of iron deficiency 07/08/2015  . Absolute anemia 04/27/2015  . Diabetic peripheral neuropathy associated with type 2 diabetes mellitus ([place]) 04/27/2015  . Breath shortness 04/27/2015  . Accumulation of fluid in tissues 04/27/2015  . Cardiac murmur 04/27/2015  . Elective abortion 04/27/2015  . HLD (hyperlipidemia) 04/27/2015  . Decreased potassium in the blood 04/27/2015  . Hypothyroidism 04/27/2015  . Obstructive sleep apnea 04/27/2015  . Disorder of peripheral nervous system ([place]) 04/27/2015  . Obstructive apnea 04/27/2015  . Pain in shoulder 04/27/2015  . Diabetes mellitus, type 2 ([place]) 04/27/2015  . Fibroid 10/25/2013  . Aortic valve stenosis 05/01/2011  . Edema 03/27/2011  . SOB (shortness of breath) 03/27/2011  . Diabetes mellitus ([place]) 03/27/2011  . HTN (hypertension) 03/27/2011    . Essential (primary) hypertension 07/05/2006  . Anxiety, generalized 07/05/2006   Past Surgical History:  Procedure Laterality Date  . ABDOMINAL HYSTERECTOMY  11/17/2013  . BREAST BIOPSY     x2  . CESAREAN SECTION     x 2  . CHOLECYSTECTOMY  2003   DUE TO STONES  . DILATION AND CURETTAGE OF UTERUS    . ELBOW SURGERY    . POLYPECTOMY  1995   Family History  Problem Relation Age of Onset  . Hyperlipidemia Mother   . Thyroid disease Mother   . Hashimoto's thyroiditis Mother   . Heart disease Father     open heart surgery  . Heart failure Father   . Diabetes Father   . Dementia Father   . Thyroid disease Brother   . Breast cancer Maternal Grandmother   . Diabetes Paternal Grandmother   . Prostate cancer Paternal Grandfather    No Known Allergies  Current Outpatient Prescriptions:  .  aspirin 81 MG EC tablet, Take 81 mg by mouth at bedtime. , Disp: , Rfl:  .  Cholecalciferol (VITAMIN D3) 1000 UNITS CAPS, Take 1 capsule by mouth every morning. , Disp: , Rfl:  .  fish oil-omega-3 fatty acids 1000 MG capsule, Take 1 g by mouth 2 (two) times daily. , Disp: , Rfl:  .  furosemide (LASIX) 40 MG tablet, Take 1 tablet (40 mg total) by mouth daily as needed for fluid or edema., Disp: 30 tablet, Rfl: 1 .  Glucosamine 500 MG CAPS, Take 500 mg by mouth 2 (two) times daily., Disp: , Rfl:  .  glyBURIDE (DIABETA) 5 MG  tablet, TAKE ONE TABLET BY MOUTH TWICE DAILY WITH A MEAL, Disp: 60 tablet, Rfl: 6 .  levothyroxine (SYNTHROID, LEVOTHROID) 75 MCG tablet, Take 1 tablet (75 mcg total) by mouth daily before breakfast., Disp: 90 tablet, Rfl: 3 .  lisinopril-hydrochlorothiazide (PRINZIDE,ZESTORETIC) 20-25 MG tablet, TAKE ONE TABLET BY MOUTH ONCE DAILY, Disp: 90 tablet, Rfl: 3 .  metFORMIN (GLUMETZA) 1000 MG (MOD) 24 hr tablet, Take 1 tablet (1,000 mg total) by mouth 2 (two) times daily with a meal., Disp: 180 tablet, Rfl: 3 .  metoprolol tartrate (LOPRESSOR) 25 MG tablet, Take by mouth., Disp: ,  Rfl:  .  Multiple Vitamin (MULTIVITAMIN) tablet, Take 1 tablet by mouth every morning. , Disp: , Rfl:  .  PARoxetine (PAXIL) 20 MG tablet, Take 1 tablet (20 mg total) by mouth daily., Disp: 30 tablet, Rfl: 6 .  pravastatin (PRAVACHOL) 40 MG tablet, Take 1 tablet (40 mg total) by mouth daily., Disp: 30 tablet, Rfl: 6 .  VICTOZA 18 MG/3ML SOPN, INJECT SUBCUTANEOUSLY 1.2MG  DAILY, Disp: 18 mL, Rfl: 3  Review of Systems  Constitutional: Negative for fever.  Gastrointestinal: Positive for abdominal pain, anorexia, constipation, diarrhea ("leakage"), flatus, nausea and vomiting. Negative for hematochezia and melena.  Genitourinary: Negative for dysuria, frequency and hematuria.  Musculoskeletal: Negative for arthralgias and myalgias.  Neurological: Negative for headaches.    Social History  Substance Use Topics  . Smoking status: Former Smoker    Packs/day: 1.50    Years: 25.00    Types: Cigarettes    Quit date: 11/24/2001  . Smokeless tobacco: Never Used  . Alcohol use 0.0 oz/week     Comment: OCCASIONALLY DRINKS WINE, ONCE A WEEK   Objective:   BP 112/74 (BP Location: Right Arm, Patient Position: Sitting, Cuff Size: Large)   Pulse 76   Temp 98 F (36.7 C) (Oral)   Resp 16   Wt 201 lb (91.2 kg)   BMI 35.61 kg/m  Vitals:   03/15/17 0839  BP: 112/74  Pulse: 76  Resp: 16  Temp: 98 F (36.7 C)  TempSrc: Oral  Weight: 201 lb (91.2 kg)     Physical Exam  Constitutional: She is oriented to person, place, and time.  Cardiovascular: Normal rate and regular rhythm.   Murmur heard. Unchanged Grade 9-1/6 systolic murmur right 2nd ICS at right sternal border.  Pulmonary/Chest: Effort normal and breath sounds normal.  Abdominal: Soft. Bowel sounds are normal. She exhibits mass. There is tenderness.  Mass along liver edge in RUQ and prominent edge medially.  Neurological: She is alert and oriented to person, place, and time.  Psychiatric: She has a normal mood and affect. Her  behavior is normal. Thought content normal.      Assessment & Plan:     1. Epigastric pain Onset over the past 1-2 months with nausea, diarrhea/stool leakage and epigastric discomfort. Some heartburn/reflux symptoms but no melena or hematemesis. With history of DM and hypothyroidism, may be having gastroparesis. May use Zantac 150 mg BID and bland diet. Check labs for H.pylori and liver/pancreas disease. Recheck pending lab reports. - CBC with Differential/Platelet - Comprehensive metabolic panel - Amylase - H. pylori breath test  2. RUQ abdominal mass Slight bruise RUQ of abdomen. Mild soreness to deep palpation and thickened firm mass or liver edge appreciated on exam. Will get labs and complete abdominal ultrasound to rule out metastatic lesion. - CBC with Differential/Platelet - Comprehensive metabolic panel - Amylase - US Abdomen Complete  3. Diabetic peripheral neuropathy associated  with type 2 diabetes mellitus ([place]) Followed by Dr. Gabriel Carina (endocrinologist) and continues to take Victoza 1.2 mg qd with Metformin 1000 mg BID and Glyburide 5 mg BID. Should follow low fat diabetic diet and recheck labs today. - CBC with Differential/Platelet - Comprehensive metabolic panel - Hemoglobin A1c     Patient seen and examined by Vernie Murders, PA, and note scribed by Renaldo Fiddler, CMA.  Vernie Murders, PA  [facility]

## 2017-03-16 LAB — CBC WITH DIFFERENTIAL/PLATELET
BASOS: 0 %
Basophils Absolute: 0 10*3/uL (ref 0.0–0.2)
EOS (ABSOLUTE): 0.1 10*3/uL (ref 0.0–0.4)
EOS: 2 %
Hematocrit: 37.3 % (ref 34.0–46.6)
Hemoglobin: 11.8 g/dL (ref 11.1–15.9)
IMMATURE GRANULOCYTES: 0 %
Immature Grans (Abs): 0 10*3/uL (ref 0.0–0.1)
Lymphocytes Absolute: 1.7 10*3/uL (ref 0.7–3.1)
Lymphs: 32 %
MCH: 24.3 pg — AB (ref 26.6–33.0)
MCHC: 31.6 g/dL (ref 31.5–35.7)
MCV: 77 fL — ABNORMAL LOW (ref 79–97)
MONOS ABS: 0.3 10*3/uL (ref 0.1–0.9)
Monocytes: 6 %
NEUTROS ABS: 3.2 10*3/uL (ref 1.4–7.0)
NEUTROS PCT: 60 %
Platelets: 142 10*3/uL — ABNORMAL LOW (ref 150–379)
RBC: 4.86 x10E6/uL (ref 3.77–5.28)
RDW: 15.2 % (ref 12.3–15.4)
WBC: 5.3 10*3/uL (ref 3.4–10.8)

## 2017-03-16 LAB — COMPREHENSIVE METABOLIC PANEL
A/G RATIO: 1.8 (ref 1.2–2.2)
ALT: 57 IU/L — ABNORMAL HIGH (ref 0–32)
AST: 56 IU/L — AB (ref 0–40)
Albumin: 4.3 g/dL (ref 3.5–5.5)
Alkaline Phosphatase: 79 IU/L (ref 39–117)
BUN/Creatinine Ratio: 23 (ref 9–23)
BUN: 15 mg/dL (ref 6–24)
Bilirubin Total: 0.4 mg/dL (ref 0.0–1.2)
CALCIUM: 9.2 mg/dL (ref 8.7–10.2)
CO2: 26 mmol/L (ref 18–29)
Chloride: 95 mmol/L — ABNORMAL LOW (ref 96–106)
Creatinine, Ser: 0.64 mg/dL (ref 0.57–1.00)
GFR, EST AFRICAN AMERICAN: 115 mL/min/{1.73_m2} (ref >59)
GFR, EST NON AFRICAN AMERICAN: 100 mL/min/{1.73_m2} (ref >59)
GLOBULIN, TOTAL: 2.4 g/dL (ref 1.5–4.5)
Glucose: 225 mg/dL — ABNORMAL HIGH (ref 65–99)
POTASSIUM: 3.9 mmol/L (ref 3.5–5.2)
SODIUM: 141 mmol/L (ref 134–144)
Total Protein: 6.7 g/dL (ref 6.0–8.5)

## 2017-03-16 LAB — HEMOGLOBIN A1C
Est. average glucose Bld gHb Est-mCnc: 183 mg/dL
HEMOGLOBIN A1C: 8 % — AB (ref 4.8–5.6)

## 2017-03-16 LAB — AMYLASE: Amylase: 39 U/L (ref 31–124)

## 2017-03-16 LAB — H. PYLORI BREATH TEST: H. PYLORI UBIT: NEGATIVE

## 2017-03-19 ENCOUNTER — Ambulatory Visit
Admission: RE | Admit: 2017-03-19 | Discharge: 2017-03-19 | Disposition: A | Discharge status: Home / Self Care | Payer: Managed Care, Other (non HMO) | Source: Ambulatory Visit | Attending: Family Medicine | Admitting: Family Medicine

## 2017-03-19 ENCOUNTER — Other Ambulatory Visit: Payer: Self-pay | Admitting: Family Medicine

## 2017-03-19 ENCOUNTER — Telehealth: Payer: Self-pay

## 2017-03-19 DIAGNOSIS — R1901 Right upper quadrant abdominal swelling, mass and lump: Secondary | ICD-10-CM | POA: Diagnosis not present

## 2017-03-19 DIAGNOSIS — Z9049 Acquired absence of other specified parts of digestive tract: Secondary | ICD-10-CM | POA: Insufficient documentation

## 2017-03-19 DIAGNOSIS — R161 Splenomegaly, not elsewhere classified: Secondary | ICD-10-CM | POA: Diagnosis not present

## 2017-03-19 DIAGNOSIS — R160 Hepatomegaly, not elsewhere classified: Secondary | ICD-10-CM | POA: Diagnosis not present

## 2017-03-19 NOTE — Telephone Encounter (Signed)
-----   Message from Margo Common, [place] sent at 03/18/2017  6:09 PM EDT ----- Blood sugar and Hgb A1C much higher. Need to use Glucotrol 5 mg BID and Metformin 1000 mg BID with Victoza increased to 1.8 mg daily. RBC's a little small in size. Ask lab to run iron level with IBC. Still waiting on the abdominal ultrasound.

## 2017-03-19 NOTE — Telephone Encounter (Signed)
LMTCB, [facility] add on form faxed.

## 2017-03-19 NOTE — Telephone Encounter (Signed)
Patient advised and verbally voiced understanding.  

## 2017-03-19 NOTE — Telephone Encounter (Signed)
Pt is returning call.  GX#[phone]

## 2017-03-21 ENCOUNTER — Other Ambulatory Visit: Payer: Self-pay | Admitting: Family Medicine

## 2017-03-21 ENCOUNTER — Telehealth: Payer: Self-pay

## 2017-03-21 DIAGNOSIS — R160 Hepatomegaly, not elsewhere classified: Secondary | ICD-10-CM

## 2017-03-21 DIAGNOSIS — K8689 Other specified diseases of pancreas: Secondary | ICD-10-CM

## 2017-03-21 LAB — IRON AND TIBC
IRON SATURATION: 13 % — AB (ref 15–55)
Iron: 44 ug/dL (ref 27–159)
Total Iron Binding Capacity: 349 ug/dL (ref 250–450)
UIBC: 305 ug/dL (ref 131–425)

## 2017-03-21 LAB — SPECIMEN STATUS REPORT

## 2017-03-21 NOTE — Telephone Encounter (Signed)
Patient advised as below. Patient had not been previously advised of [place] results, so I advised her. Is the order for MRI already entered? I couldn't find it. Please advise.

## 2017-03-21 NOTE — Telephone Encounter (Signed)
-----   Message from Margo Common, [place] sent at 03/21/2017  8:51 AM EDT ----- H.pylori test negative. Be sure she got the report of her abdominal ultrasound showing liver enlargement and a questionable lump in the pancreas. Should already have an order in for an MRI with pancreatic protocol.

## 2017-03-26 ENCOUNTER — Telehealth: Payer: Self-pay | Admitting: Cardiology

## 2017-03-26 NOTE — Telephone Encounter (Signed)
Left voicemail message to call back. Will review with her when she calls back that the sitting stress test machine is not available in this area.

## 2017-03-29 NOTE — Telephone Encounter (Signed)
Spoke with patient and let her know that we don't have that option here and she states that right now she is having some issues with her stomach and liver. She was not sure what to do right now. Instructed her to call back if we can assist in anyway and that I would make Dr. Yvone Neu aware as well.

## 2017-04-02 ENCOUNTER — Other Ambulatory Visit: Payer: Self-pay

## 2017-04-02 DIAGNOSIS — K8689 Other specified diseases of pancreas: Secondary | ICD-10-CM

## 2017-04-02 DIAGNOSIS — R160 Hepatomegaly, not elsewhere classified: Secondary | ICD-10-CM

## 2017-04-02 DIAGNOSIS — R1013 Epigastric pain: Secondary | ICD-10-CM

## 2017-04-02 NOTE — Progress Notes (Signed)
Contacted patient with MRI Abdomen results per Simona Huh. Results scanned in chart. Patient states she is still having epigastric discomfort. GI referral placed. Patient advised she will received a call back with appointment date and time.

## 2017-04-05 ENCOUNTER — Encounter: Payer: Self-pay | Admitting: Gastroenterology

## 2017-04-29 ENCOUNTER — Telehealth: Payer: Self-pay | Admitting: Family Medicine

## 2017-04-29 ENCOUNTER — Other Ambulatory Visit: Payer: Self-pay | Admitting: Family Medicine

## 2017-04-29 NOTE — Telephone Encounter (Signed)
Error/MW °

## 2017-05-06 ENCOUNTER — Other Ambulatory Visit: Payer: Self-pay | Admitting: Family Medicine

## 2017-05-06 MED ORDER — LEVOTHYROXINE SODIUM 75 MCG PO TABS: 75.0000 ug | ORAL_TABLET | Freq: Every day | ORAL | 0 refills | Status: DC

## 2017-05-06 NOTE — Telephone Encounter (Signed)
[id number] 

## 2017-05-06 NOTE — Telephone Encounter (Signed)
LOV 03/15/2017. Last refill 12/04/2016. Renaldo Fiddler, CMA

## 2017-05-06 NOTE — Telephone Encounter (Signed)
Should have 2 or 3 refills remaining

## 2017-05-06 NOTE — Telephone Encounter (Signed)
Spoke with patient on the phone she says her bottle has three more refills on it but she only has a few pills left and states [facility] would take a while to get prescription to her, patient is requesting that Rx be sent to [facility] on [address]. KW

## 2017-05-06 NOTE — Telephone Encounter (Signed)
Pt contacted office for refill request on the following medications:  levothyroxine (SYNTHROID, LEVOTHROID) 75 MCG tablet.  [place]  ZD#[phone]/[phone]  This is a pt of Dennis/MW

## 2017-05-14 ENCOUNTER — Other Ambulatory Visit: Payer: Self-pay | Admitting: Family Medicine

## 2017-06-02 ENCOUNTER — Other Ambulatory Visit: Payer: Self-pay | Admitting: Family Medicine

## 2017-07-19 ENCOUNTER — Encounter: Payer: Self-pay | Admitting: Family Medicine

## 2017-07-19 ENCOUNTER — Ambulatory Visit (INDEPENDENT_AMBULATORY_CARE_PROVIDER_SITE_OTHER): Payer: Managed Care, Other (non HMO) | Admitting: Family Medicine

## 2017-07-19 VITALS — BP 122/68 | HR 68 | Temp 97.6°F | Resp 16 | Wt 199.0 lb

## 2017-07-19 DIAGNOSIS — I350 Nonrheumatic aortic (valve) stenosis: Secondary | ICD-10-CM | POA: Diagnosis not present

## 2017-07-19 DIAGNOSIS — E119 Type 2 diabetes mellitus without complications: Secondary | ICD-10-CM | POA: Diagnosis not present

## 2017-07-19 DIAGNOSIS — L309 Dermatitis, unspecified: Secondary | ICD-10-CM | POA: Diagnosis not present

## 2017-07-19 DIAGNOSIS — E039 Hypothyroidism, unspecified: Secondary | ICD-10-CM

## 2017-07-19 DIAGNOSIS — E782 Mixed hyperlipidemia: Secondary | ICD-10-CM | POA: Diagnosis not present

## 2017-07-19 NOTE — Progress Notes (Signed)
Patient: Emily Tapia Female    DOB: [birth year]   56 y.o.   MRN: [id number] Visit Date: 07/19/2017  Today's Provider: Vernie Murders, PA   Chief Complaint  Patient presents with  . Rash   Subjective:    HPI Pt is here for a rash/"bumps" around her neck, just under her chin on both sides of her neck. She reports that one has been there for about a year and the other has been there for about a month. She also gets "bumps" in her head that are pimple like and tender at first. Pt reports that she is also still having problems with bloating, pain, loose stools and constipation. She says that she has mentioned this before.   Patient Active Problem List   Diagnosis Date Noted  . Mass of pelvis 07/08/2015  . Hyperlipidemia, mixed 07/08/2015  . Hx of iron deficiency 07/08/2015  . Absolute anemia 04/27/2015  . Diabetic peripheral neuropathy associated with type 2 diabetes mellitus ([place]) 04/27/2015  . Breath shortness 04/27/2015  . Accumulation of fluid in tissues 04/27/2015  . Cardiac murmur 04/27/2015  . Elective abortion 04/27/2015  . HLD (hyperlipidemia) 04/27/2015  . Decreased potassium in the blood 04/27/2015  . Hypothyroidism 04/27/2015  . Obstructive sleep apnea 04/27/2015  . Disorder of peripheral nervous system 04/27/2015  . Obstructive apnea 04/27/2015  . Pain in shoulder 04/27/2015  . Diabetes mellitus, type 2 ([place]) 04/27/2015  . Fibroid 10/25/2013  . Aortic valve stenosis 05/01/2011  . Edema 03/27/2011  . SOB (shortness of breath) 03/27/2011  . Diabetes mellitus ([place]) 03/27/2011  . HTN (hypertension) 03/27/2011  . Essential (primary) hypertension 07/05/2006  . Anxiety, generalized 07/05/2006   Past Surgical History:  Procedure Laterality Date  . ABDOMINAL HYSTERECTOMY  11/17/2013  . BREAST BIOPSY     x2  . CESAREAN SECTION     x 2  . CHOLECYSTECTOMY  2003   DUE TO STONES  . DILATION AND CURETTAGE OF UTERUS    . ELBOW SURGERY    . POLYPECTOMY   1995   No Known Allergies   Current Outpatient Prescriptions:  .  aspirin 81 MG EC tablet, Take 81 mg by mouth at bedtime. , Disp: , Rfl:  .  Cholecalciferol (VITAMIN D3) 1000 UNITS CAPS, Take 1 capsule by mouth every morning. , Disp: , Rfl:  .  fish oil-omega-3 fatty acids 1000 MG capsule, Take 1 g by mouth 2 (two) times daily. , Disp: , Rfl:  .  furosemide (LASIX) 40 MG tablet, TAKE ONE TABLET BY MOUTH ONCE DAILY AS NEEDED FOR FLUID OR EDEMA, Disp: 30 tablet, Rfl: 1 .  Glucosamine 500 MG CAPS, Take 500 mg by mouth 2 (two) times daily., Disp: , Rfl:  .  glyBURIDE (DIABETA) 5 MG tablet, TAKE ONE TABLET BY MOUTH TWICE DAILY WITH A MEAL, Disp: 60 tablet, Rfl: 6 .  levothyroxine (SYNTHROID, LEVOTHROID) 75 MCG tablet, Take 1 tablet (75 mcg total) by mouth daily before breakfast., Disp: 30 tablet, Rfl: 0 .  lisinopril-hydrochlorothiazide (PRINZIDE,ZESTORETIC) 20-25 MG tablet, TAKE ONE TABLET BY MOUTH ONCE DAILY, Disp: 90 tablet, Rfl: 3 .  metFORMIN (GLUMETZA) 1000 MG (MOD) 24 hr tablet, Take 1 tablet (1,000 mg total) by mouth 2 (two) times daily with a meal., Disp: 180 tablet, Rfl: 3 .  Multiple Vitamin (MULTIVITAMIN) tablet, Take 1 tablet by mouth every morning. , Disp: , Rfl:  .  PARoxetine (PAXIL) 20 MG tablet, TAKE ONE TABLET BY MOUTH ONCE  DAILY, Disp: 30 tablet, Rfl: 6 .  VICTOZA 18 MG/3ML SOPN, INJECT SUBCUTANEOUSLY 1.2MG  DAILY, Disp: 18 mL, Rfl: 3 .  metoprolol tartrate (LOPRESSOR) 25 MG tablet, Take by mouth., Disp: , Rfl:  .  pravastatin (PRAVACHOL) 40 MG tablet, TAKE ONE TABLET BY MOUTH ONCE DAILY (Patient not taking: Reported on 07/19/2017), Disp: 90 tablet, Rfl: 0  Review of Systems  Constitutional: Negative.   HENT: Negative.   Eyes: Negative.   Respiratory: Negative.   Cardiovascular: Negative.   Gastrointestinal: Positive for abdominal pain, constipation and diarrhea.  Endocrine: Negative.   Genitourinary: Negative.   Musculoskeletal: Negative.   Skin: Positive for rash.    Allergic/Immunologic: Negative.   Neurological: Negative.   Hematological: Negative.   Psychiatric/Behavioral: Negative.     Social History  Substance Use Topics  . Smoking status: Former Smoker    Packs/day: 1.50    Years: 25.00    Types: Cigarettes    Quit date: 11/24/2001  . Smokeless tobacco: Never Used  . Alcohol use 0.0 oz/week     Comment: OCCASIONALLY DRINKS WINE, ONCE A WEEK   Objective:   BP 122/68 (BP Location: Right Arm, Patient Position: Sitting, Cuff Size: Large)   Pulse 68   Temp 97.6 F (36.4 C) (Oral)   Resp 16   Wt 199 lb (90.3 kg)   BMI 35.25 kg/m  Vitals:   07/19/17 1512  BP: 122/68  Pulse: 68  Resp: 16  Temp: 97.6 F (36.4 C)  TempSrc: Oral  Weight: 199 lb (90.3 kg)   Physical Exam  Constitutional: She appears well-developed and well-nourished.  HENT:  Head: Normocephalic.  Eyes: Conjunctivae are normal.  Neck: Neck supple.  Cardiovascular: Normal rate and regular rhythm.   Pulmonary/Chest: Effort normal and breath sounds normal.  Abdominal: Soft. Bowel sounds are normal.  Lymphadenopathy:    She has cervical adenopathy.  Skin:  Patch of dry skin on right lower back and a few papules under the chin. No itching. Couple pustules (pinpoint) on left occipital hairline.      Assessment & Plan:     1. Eczema, unspecified type Dry dermatitis on the right lower lumbar skin. No drainage, blisters or signs of infection. Suspect eczema. Also, has 5-6 pinpoint lesions on neck under chin. Check CBC and proceed with Hydrocortisone Cream. Recheck CBC and follow up pending reports. - CBC with Differential/Platelet  2. Aortic valve stenosis, etiology of cardiac valve disease unspecified Unchanged murmur. Unable to accomplish stress test due to anxiety level. Followed by Dr. Yvone Neu (cardiologist) and trying to locate a sitting machine.  3. Type 2 diabetes mellitus without complication, without long-term current use of insulin (HCC) No longer followed by  Dr. Graceann Congress (endocrinologist) but takes Metformin, Victoza and Glyburide regularly. Having difficulty losing any weight. No hypoglycemic episodes, polyuria, polydipsia or polyphagia. Recheck labs. - Comprehensive metabolic panel - Hemoglobin A1c - Lipid panel  4. Hypothyroidism, unspecified type Tolerating Levothyroxine 75 mcg qd. Still having some sweats and inability to lose weight. Recheck labs. - Comprehensive metabolic panel - TSH - T4  5. Hyperlipidemia, mixed Continues to use Fish Oil but no longer taking a statin. Recommend low fat diet and weight loss. Recheck labs and follow up pending reports. - Comprehensive metabolic panel. - TSH - Lipid panel       Vernie Murders, PA  [facility]

## 2017-07-24 LAB — COMPREHENSIVE METABOLIC PANEL
A/G RATIO: 1.6 (ref 1.2–2.2)
ALK PHOS: 75 IU/L (ref 39–117)
ALT: 67 IU/L — AB (ref 0–32)
AST: 64 IU/L — AB (ref 0–40)
Albumin: 4.2 g/dL (ref 3.5–5.5)
BUN/Creatinine Ratio: 16 (ref 9–23)
BUN: 10 mg/dL (ref 6–24)
Bilirubin Total: 0.2 mg/dL (ref 0.0–1.2)
CALCIUM: 9.4 mg/dL (ref 8.7–10.2)
CO2: 25 mmol/L (ref 20–29)
Chloride: 99 mmol/L (ref 96–106)
Creatinine, Ser: 0.64 mg/dL (ref 0.57–1.00)
GFR calc Af Amer: 115 mL/min/{1.73_m2} (ref >59)
GFR, EST NON AFRICAN AMERICAN: 99 mL/min/{1.73_m2} (ref >59)
Globulin, Total: 2.7 g/dL (ref 1.5–4.5)
Glucose: 141 mg/dL — ABNORMAL HIGH (ref 65–99)
POTASSIUM: 4.3 mmol/L (ref 3.5–5.2)
SODIUM: 140 mmol/L (ref 134–144)
Total Protein: 6.9 g/dL (ref 6.0–8.5)

## 2017-07-24 LAB — CBC WITH DIFFERENTIAL/PLATELET
BASOS ABS: 0 10*3/uL (ref 0.0–0.2)
Basos: 0 %
EOS (ABSOLUTE): 0.1 10*3/uL (ref 0.0–0.4)
Eos: 3 %
Hematocrit: 37.3 % (ref 34.0–46.6)
Hemoglobin: 11.4 g/dL (ref 11.1–15.9)
IMMATURE GRANULOCYTES: 0 %
Immature Grans (Abs): 0 10*3/uL (ref 0.0–0.1)
LYMPHS ABS: 1.8 10*3/uL (ref 0.7–3.1)
Lymphs: 31 %
MCH: 23.4 pg — ABNORMAL LOW (ref 26.6–33.0)
MCHC: 30.6 g/dL — ABNORMAL LOW (ref 31.5–35.7)
MCV: 77 fL — ABNORMAL LOW (ref 79–97)
MONOS ABS: 0.2 10*3/uL (ref 0.1–0.9)
Monocytes: 4 %
NEUTROS PCT: 62 %
Neutrophils Absolute: 3.5 10*3/uL (ref 1.4–7.0)
PLATELETS: 154 10*3/uL (ref 150–379)
RBC: 4.87 x10E6/uL (ref 3.77–5.28)
RDW: 16.1 % — AB (ref 12.3–15.4)
WBC: 5.7 10*3/uL (ref 3.4–10.8)

## 2017-07-24 LAB — LIPID PANEL
CHOL/HDL RATIO: 4.7 ratio — AB (ref 0.0–4.4)
Cholesterol, Total: 173 mg/dL (ref 100–199)
HDL: 37 mg/dL — ABNORMAL LOW (ref >39)
LDL CALC: 102 mg/dL — AB (ref 0–99)
TRIGLYCERIDES: 169 mg/dL — AB (ref 0–149)
VLDL Cholesterol Cal: 34 mg/dL (ref 5–40)

## 2017-07-24 LAB — HEMOGLOBIN A1C
ESTIMATED AVERAGE GLUCOSE: 151 mg/dL
HEMOGLOBIN A1C: 6.9 % — AB (ref 4.8–5.6)

## 2017-07-24 LAB — T4: T4, Total: 10.4 ug/dL (ref 4.5–12.0)

## 2017-07-24 LAB — TSH: TSH: 4.34 u[IU]/mL (ref 0.450–4.500)

## 2017-08-16 ENCOUNTER — Other Ambulatory Visit: Payer: Self-pay | Admitting: Family Medicine

## 2017-09-02 ENCOUNTER — Other Ambulatory Visit: Payer: Self-pay | Admitting: Family Medicine

## 2017-09-02 MED ORDER — LEVOTHYROXINE SODIUM 75 MCG PO TABS: 75.0000 ug | ORAL_TABLET | Freq: Every day | ORAL | 3 refills | Status: DC

## 2017-09-02 NOTE — Telephone Encounter (Signed)
Please review. Thanks!  

## 2017-09-02 NOTE — Telephone Encounter (Signed)
[facility] faxed a request on the following medication. Thanks CC  levothyroxine (SYNTHROID, LEVOTHROID) 75 MCG tablet  >Take one tablet by mouth once daily on an empty stomach with a glass of water at least 30 to 60 minutes before breakfast.

## 2017-09-11 ENCOUNTER — Other Ambulatory Visit: Payer: Self-pay | Admitting: Family Medicine

## 2017-09-11 DIAGNOSIS — E119 Type 2 diabetes mellitus without complications: Secondary | ICD-10-CM

## 2017-09-25 ENCOUNTER — Other Ambulatory Visit: Payer: Self-pay | Admitting: Family Medicine

## 2017-10-05 ENCOUNTER — Other Ambulatory Visit: Payer: Self-pay | Admitting: Family Medicine

## 2017-10-21 ENCOUNTER — Telehealth: Payer: Self-pay

## 2017-10-21 MED ORDER — METOPROLOL TARTRATE 25 MG PO TABS: 25.0000 mg | ORAL_TABLET | Freq: Three times a day (TID) | ORAL | 0 refills | Status: DC

## 2017-10-21 NOTE — Telephone Encounter (Signed)
Patient is requesting a refill on the Metoprolol but the patient has not been seen since Dr. Yvone Neu was here.  Please advise for patient.

## 2017-10-21 NOTE — Telephone Encounter (Signed)
Refill for 90 days sent in and patient will then be due for follow up appointment.

## 2017-11-20 ENCOUNTER — Telehealth: Payer: Self-pay

## 2017-11-20 NOTE — Telephone Encounter (Signed)
Patient is due for diabetes follow up, LMTCB-[place] V Linton Stolp, RMA

## 2017-11-21 NOTE — Telephone Encounter (Signed)
Rondall Allegra scheduled patient-Emily Tapia Emily Tapia, RMA

## 2017-12-24 ENCOUNTER — Ambulatory Visit (INDEPENDENT_AMBULATORY_CARE_PROVIDER_SITE_OTHER): Payer: No Typology Code available for payment source | Admitting: Family Medicine

## 2017-12-24 ENCOUNTER — Encounter: Payer: Self-pay | Admitting: Family Medicine

## 2017-12-24 VITALS — BP 122/70 | HR 65 | Temp 98.4°F | Ht 63.0 in | Wt 198.8 lb

## 2017-12-24 DIAGNOSIS — Z1231 Encounter for screening mammogram for malignant neoplasm of breast: Secondary | ICD-10-CM | POA: Diagnosis not present

## 2017-12-24 DIAGNOSIS — E1142 Type 2 diabetes mellitus with diabetic polyneuropathy: Secondary | ICD-10-CM

## 2017-12-24 DIAGNOSIS — Z1211 Encounter for screening for malignant neoplasm of colon: Secondary | ICD-10-CM | POA: Diagnosis not present

## 2017-12-24 DIAGNOSIS — E782 Mixed hyperlipidemia: Secondary | ICD-10-CM

## 2017-12-24 DIAGNOSIS — Z114 Encounter for screening for human immunodeficiency virus [HIV]: Secondary | ICD-10-CM

## 2017-12-24 DIAGNOSIS — F411 Generalized anxiety disorder: Secondary | ICD-10-CM | POA: Diagnosis not present

## 2017-12-24 DIAGNOSIS — Z23 Encounter for immunization: Secondary | ICD-10-CM | POA: Diagnosis not present

## 2017-12-24 DIAGNOSIS — E039 Hypothyroidism, unspecified: Secondary | ICD-10-CM

## 2017-12-24 DIAGNOSIS — Z1159 Encounter for screening for other viral diseases: Secondary | ICD-10-CM

## 2017-12-24 DIAGNOSIS — I10 Essential (primary) hypertension: Secondary | ICD-10-CM

## 2017-12-24 DIAGNOSIS — I350 Nonrheumatic aortic (valve) stenosis: Secondary | ICD-10-CM | POA: Diagnosis not present

## 2017-12-24 LAB — POCT UA - MICROALBUMIN: MICROALBUMIN (UR) POC: 50 mg/L

## 2017-12-24 LAB — POCT GLYCOSYLATED HEMOGLOBIN (HGB A1C): Hemoglobin A1C: 6.9

## 2017-12-24 MED ORDER — LISINOPRIL-HYDROCHLOROTHIAZIDE 20-25 MG PO TABS: 1.0000 | ORAL_TABLET | Freq: Every day | ORAL | 3 refills | Status: DC

## 2017-12-24 MED ORDER — PAROXETINE HCL 20 MG PO TABS: 20.0000 mg | ORAL_TABLET | Freq: Every day | ORAL | 3 refills | Status: DC

## 2017-12-24 NOTE — Progress Notes (Signed)
Patient: Emily Tapia Female    DOB: [birth year]   57 y.o.   MRN: [id number] Visit Date: 12/24/2017  Today's Provider: Vernie Murders, PA   Chief Complaint  Patient presents with  . Diabetes  . Hyperlipidemia  . Hypertension  . Follow-up   Subjective:    HPI  Diabetes Mellitus Type II, Follow-up:   Lab Results  Component Value Date   HGBA1C 6.9 (H) 07/23/2017   HGBA1C 8.0 (H) 03/15/2017   HGBA1C 7.5 (H) 08/31/2016   Last seen for diabetes 5 months ago.  Management since then includes continue medication, diet and exercise. She reports good compliance with treatment. She is not having side effects.  Current symptoms include none  Home blood sugar records: not checking. Glucometer not working  Episodes of hypoglycemia? unknown   Current Insulin Regimen: none Most Recent Eye Exam: not UTD. Pt will schedule Weight trend: stable Current diet: in general, a "healthy" diet   Current exercise: cardiovascular workout on exercise equipment  ------------------------------------------------------------------------   Hypertension, follow-up:  BP Readings from Last 3 Encounters:  12/24/17 122/70  07/19/17 122/68  03/15/17 112/74    She was last seen for hypertension 5 months ago.  BP at that visit was 122/68. Management since that visit includes continue medication,diet and exercise.She reports good compliance with treatment. She is not having side effects.  She is exercising. She is adherent to low salt diet.   Outside blood pressures are being checked occasionally. She is experiencing none.  Patient denies chest pain, chest pressure/discomfort, claudication, dyspnea, exertional chest pressure/discomfort, fatigue, irregular heart beat, lower extremity edema, near-syncope, orthopnea, palpitations, paroxysmal nocturnal dyspnea, syncope and tachypnea.   Cardiovascular risk factors include diabetes mellitus, dyslipidemia and obesity (BMI >= 30 kg/m2).  Use of  agents associated with hypertension: none.   ------------------------------------------------------------------------    Lipid/Cholesterol, Follow-up:   Last seen for this 5 months ago.  Management since that visit includes continue fish oil, diet and exercise.  Last Lipid Panel:    Component Value Date/Time   CHOL 173 07/23/2017 0811   TRIG 169 (H) 07/23/2017 0811   HDL 37 (L) 07/23/2017 0811   CHOLHDL 4.7 (H) 07/23/2017 0811   LDLCALC 102 (H) 07/23/2017 5284    She reports good compliance with treatment. She is not having side effects.   Wt Readings from Last 3 Encounters:  12/24/17 198 lb 12.8 oz (90.2 kg)  07/19/17 199 lb (90.3 kg)  03/15/17 201 lb (91.2 kg)    ------------------------------------------------------------------------  Past Medical History:  Diagnosis Date  . CHF (congestive heart failure) ([place])   . Diabetes mellitus   . Edema   . Heart murmur   . Hypertension   . Thyroid disease    Past Surgical History:  Procedure Laterality Date  . ABDOMINAL HYSTERECTOMY  11/17/2013  . BREAST BIOPSY     x2  . CESAREAN SECTION     x 2  . CHOLECYSTECTOMY  2003   DUE TO STONES  . DILATION AND CURETTAGE OF UTERUS    . ELBOW SURGERY    . POLYPECTOMY  1995   Family History  Problem Relation Age of Onset  . Hyperlipidemia Mother   . Thyroid disease Mother   . Hashimoto's thyroiditis Mother   . Heart disease Father        open heart surgery  . Heart failure Father   . Diabetes Father   . Dementia Father   . Thyroid disease Brother   .  Breast cancer Maternal Grandmother   . Diabetes Paternal Grandmother   . Prostate cancer Paternal Grandfather    No Known Allergies  Current Outpatient Medications:  .  aspirin 81 MG EC tablet, Take 81 mg by mouth at bedtime. , Disp: , Rfl:  .  Cholecalciferol (VITAMIN D3) 1000 UNITS CAPS, Take 1 capsule by mouth every morning. , Disp: , Rfl:  .  fish oil-omega-3 fatty acids 1000 MG capsule, Take 1 g by mouth 2  (two) times daily. , Disp: , Rfl:  .  furosemide (LASIX) 40 MG tablet, TAKE 1 TABLET BY MOUTH ONCE DAILY AS NEEDED FOR FLUID OR EDEMA, Disp: 30 tablet, Rfl: 1 .  glyBURIDE (DIABETA) 5 MG tablet, TAKE 1 TABLET BY MOUTH TWICE DAILY WITH A MEAL, Disp: 60 tablet, Rfl: 6 .  levothyroxine (SYNTHROID, LEVOTHROID) 75 MCG tablet, Take 1 tablet (75 mcg total) by mouth daily before breakfast., Disp: 90 tablet, Rfl: 3 .  lisinopril-hydrochlorothiazide (PRINZIDE,ZESTORETIC) 20-25 MG tablet, TAKE ONE TABLET BY MOUTH ONCE DAILY, Disp: 90 tablet, Rfl: 3 .  metFORMIN (GLUCOPHAGE) 1000 MG tablet, TAKE ONE TABLET BY MOUTH TWICE DAILY WITH A MEAL, Disp: 180 tablet, Rfl: 3 .  metoprolol tartrate (LOPRESSOR) 25 MG tablet, Take 1 tablet (25 mg total) 3 (three) times daily by mouth., Disp: 270 tablet, Rfl: 0 .  Multiple Vitamin (MULTIVITAMIN) tablet, Take 1 tablet by mouth every morning. , Disp: , Rfl:  .  PARoxetine (PAXIL) 20 MG tablet, TAKE ONE TABLET BY MOUTH ONCE DAILY, Disp: 30 tablet, Rfl: 6 .  VICTOZA 18 MG/3ML SOPN, INJECT SUBCUTANEOUSLY 1.2MG  DAILY, Disp: 18 mL, Rfl: 3 .  Glucosamine 500 MG CAPS, Take 500 mg by mouth 2 (two) times daily., Disp: , Rfl:  .  pravastatin (PRAVACHOL) 40 MG tablet, TAKE ONE TABLET BY MOUTH ONCE DAILY (Patient not taking: Reported on 07/19/2017), Disp: 90 tablet, Rfl: 0  Review of Systems  Constitutional: Negative.   Respiratory: Negative.   Cardiovascular: Negative.   Endocrine: Negative.   Musculoskeletal: Negative.    Social History   Tobacco Use  . Smoking status: Former Smoker    Packs/day: 1.50    Years: 25.00    Pack years: 37.50    Types: Cigarettes    Last attempt to quit: 11/24/2001    Years since quitting: 16.0  . Smokeless tobacco: Never Used  Substance Use Topics  . Alcohol use: Yes    Alcohol/week: 0.0 oz    Comment: OCCASIONALLY DRINKS WINE, ONCE A WEEK   Objective:   BP 122/70 (BP Location: Right Arm, Patient Position: Sitting, Cuff Size: Normal)    Pulse 65   Temp 98.4 F (36.9 C) (Oral)   Ht 5\' 3"  (1.6 m)   Wt 198 lb 12.8 oz (90.2 kg)   SpO2 97%   BMI 35.22 kg/m   Physical Exam  Constitutional: She is oriented to person, place, and time. She appears well-developed and well-nourished. No distress.  HENT:  Head: Normocephalic and atraumatic.  Right Ear: Hearing normal.  Left Ear: Hearing normal.  Nose: Nose normal.  Eyes: Conjunctivae and lids are normal. Right eye exhibits no discharge. Left eye exhibits no discharge. No scleral icterus.  Neck: Neck supple.  Cardiovascular: Normal rate.  Murmur heard. Unchanged systolic murmur heard best at right sternal border at 2nd ICS.  Pulmonary/Chest: Effort normal and breath sounds normal. No respiratory distress.  Abdominal: Soft. Bowel sounds are normal.  Musculoskeletal: Normal range of motion.  Neurological: She is alert and oriented  to person, place, and time.  Skin: Skin is intact. No lesion and no rash noted.  Psychiatric: She has a normal mood and affect. Her speech is normal and behavior is normal. Thought content normal.   Diabetic Foot Exam - Simple   Simple Foot Form Diabetic Foot exam was performed with the following findings:  Yes 12/24/2017  8:54 AM  Visual Inspection Sensation Testing Pulse Check Comments Small callus plantar surface of left foot over 3rd metatarsal head.        Assessment & Plan:     1. Diabetic peripheral neuropathy associated with type 2 diabetes mellitus (HCC) Stable on the Metfromin 1000 mg BID, Glyburide 5 mg BID and Victoza 1.2 mg SQ qd. Trying to lose some weight and follow low fat diabetic diet. Hgb A1C 6.9% today and no significant change in diabetic foot exam. Recommend she consider recheck with endocrinologist, annual ophthalmology exam and possibly podiatry evaluation. Microalbumin stable at 50 mg/L. Check labs and follow up pending reports. - POCT UA - Microalbumin - POCT HgB A1C - CBC with Differential/Platelet - Comprehensive  metabolic panel - Lipid panel  2. Essential (primary) hypertension Stable and well controlled. Tolerating the Prinzide without side effects. Will check routine labs and refill Prinzide. Follow up pending reports. - CBC with Differential/Platelet - Comprehensive metabolic panel - Lipid panel - TSH - lisinopril-hydrochlorothiazide (PRINZIDE,ZESTORETIC) 20-25 MG tablet; Take 1 tablet by mouth daily.  Dispense: 90 tablet; Refill: 3  3. Aortic valve stenosis, etiology of cardiac valve disease unspecified Stable systolic murmur. Has not had follow up with Dr. Rockey Situ (cardiologist) the past year. No dizziness, syncope, dyspnea, significant peripheral edema or palpitations. Continues to tolerate the Metoprolol Tartrate 25 mg TID to control paroxysmal atrial tachycardia. Recommend she schedule follow up with the cardiologist soon.   4. Hypothyroidism, unspecified type Presently on Levothyroxine 75 mcg qd. No exophthalmos, tremor or significant weight loss. Needs follow up appointment with her endocrinologist (Dr. Graceann Congress). Will get routine CMP with TSH & T4. - Comprehensive metabolic panel - TSH - T4  5. Mixed hyperlipidemia No longer taking the Pravastatin. Continues to exercise and keep caloric intake down. Still working on attaining more weight loss. Recheck labs. - Comprehensive metabolic panel - Lipid panel - TSH  6. Need for influenza vaccination - Flu Vaccine QUAD 6+ mos PF IM (Fluarix Quad PF)  7. Colon cancer screening - Ambulatory referral to Gastroenterology  8. Breast cancer screening by mammogram Asymptomatic. No masses noticed with self exam at home. - MM Digital Screening  9. Screening for HIV (human immunodeficiency virus) - HIV antibody  10. Need for hepatitis C screening test - Hepatitis C Antibody  11. Anxiety, generalized Episodes of anxiety are better controlled on the paroxetine. Requesting refill. No daytime sleepiness. - PARoxetine (PAXIL) 20 MG tablet;  Take 1 tablet (20 mg total) by mouth daily.  Dispense: 90 tablet; Refill: [place]

## 2017-12-25 LAB — LIPID PANEL
CHOL/HDL RATIO: 5.2 ratio — AB (ref 0.0–4.4)
Cholesterol, Total: 196 mg/dL (ref 100–199)
HDL: 38 mg/dL — AB (ref >39)
LDL CALC: 127 mg/dL — AB (ref 0–99)
Triglycerides: 156 mg/dL — ABNORMAL HIGH (ref 0–149)
VLDL CHOLESTEROL CAL: 31 mg/dL (ref 5–40)

## 2017-12-25 LAB — COMPREHENSIVE METABOLIC PANEL
ALBUMIN: 4.7 g/dL (ref 3.5–5.5)
ALT: 64 IU/L — ABNORMAL HIGH (ref 0–32)
AST: 66 IU/L — ABNORMAL HIGH (ref 0–40)
Albumin/Globulin Ratio: 1.8 (ref 1.2–2.2)
Alkaline Phosphatase: 84 IU/L (ref 39–117)
BILIRUBIN TOTAL: 0.4 mg/dL (ref 0.0–1.2)
BUN / CREAT RATIO: 21 (ref 9–23)
BUN: 13 mg/dL (ref 6–24)
CALCIUM: 9.3 mg/dL (ref 8.7–10.2)
CHLORIDE: 98 mmol/L (ref 96–106)
CO2: 27 mmol/L (ref 20–29)
Creatinine, Ser: 0.62 mg/dL (ref 0.57–1.00)
GFR, EST AFRICAN AMERICAN: 116 mL/min/{1.73_m2} (ref >59)
GFR, EST NON AFRICAN AMERICAN: 100 mL/min/{1.73_m2} (ref >59)
GLUCOSE: 174 mg/dL — AB (ref 65–99)
Globulin, Total: 2.6 g/dL (ref 1.5–4.5)
Potassium: 3.8 mmol/L (ref 3.5–5.2)
Sodium: 142 mmol/L (ref 134–144)
Total Protein: 7.3 g/dL (ref 6.0–8.5)

## 2017-12-25 LAB — CBC WITH DIFFERENTIAL/PLATELET
BASOS ABS: 0 10*3/uL (ref 0.0–0.2)
BASOS: 1 %
EOS (ABSOLUTE): 0.1 10*3/uL (ref 0.0–0.4)
Eos: 2 %
HEMOGLOBIN: 12.2 g/dL (ref 11.1–15.9)
Hematocrit: 40 % (ref 34.0–46.6)
IMMATURE GRANS (ABS): 0 10*3/uL (ref 0.0–0.1)
IMMATURE GRANULOCYTES: 0 %
LYMPHS: 30 %
Lymphocytes Absolute: 2 10*3/uL (ref 0.7–3.1)
MCH: 24.4 pg — AB (ref 26.6–33.0)
MCHC: 30.5 g/dL — ABNORMAL LOW (ref 31.5–35.7)
MCV: 80 fL (ref 79–97)
MONOCYTES: 5 %
Monocytes Absolute: 0.3 10*3/uL (ref 0.1–0.9)
NEUTROS ABS: 4.1 10*3/uL (ref 1.4–7.0)
Neutrophils: 62 %
Platelets: 156 10*3/uL (ref 150–379)
RBC: 5.01 x10E6/uL (ref 3.77–5.28)
RDW: 15.8 % — ABNORMAL HIGH (ref 12.3–15.4)
WBC: 6.5 10*3/uL (ref 3.4–10.8)

## 2017-12-25 LAB — HIV ANTIBODY (ROUTINE TESTING W REFLEX): HIV Screen 4th Generation wRfx: NONREACTIVE

## 2017-12-25 LAB — TSH: TSH: 3.23 u[IU]/mL (ref 0.450–4.500)

## 2017-12-25 LAB — HEPATITIS C ANTIBODY: Hep C Virus Ab: <0.1 s/co ratio (ref 0.0–0.9)

## 2017-12-25 LAB — T4: T4 TOTAL: 10.9 ug/dL (ref 4.5–12.0)

## 2017-12-26 ENCOUNTER — Telehealth: Payer: Self-pay

## 2017-12-26 NOTE — Telephone Encounter (Signed)
LMTCB

## 2017-12-26 NOTE — Telephone Encounter (Signed)
-----   Message from Margo Common, [place] sent at 12/26/2017  4:00 PM EST ----- Blood sugar high at 174 although Hgb A1C 6.9%. Liver enzymes still elevated. Proceed with endocrinology follow up (Dr. Graceann Congress) to decide on any diabetes medication adjustments. HDL cholesterol low with LDL and Triglycerides slightly elevated. May need statin added to diabetes treatment.

## 2017-12-27 NOTE — Telephone Encounter (Signed)
lmtcb

## 2017-12-27 NOTE — Telephone Encounter (Signed)
Patient advised as below. Patient verbalizes understanding and is in agreement with treatment plan. Patient reports she will call endo and schedule an appointment. sd

## 2018-01-08 LAB — HM DIABETES EYE EXAM

## 2018-03-15 ENCOUNTER — Other Ambulatory Visit: Payer: Self-pay | Admitting: Cardiovascular Disease

## 2018-03-17 ENCOUNTER — Other Ambulatory Visit: Payer: Self-pay | Admitting: Family Medicine

## 2018-03-17 MED ORDER — METOPROLOL TARTRATE 25 MG PO TABS: 25.0000 mg | ORAL_TABLET | Freq: Three times a day (TID) | ORAL | 1 refills | Status: DC

## 2018-03-17 NOTE — Telephone Encounter (Signed)
Pt requesting refill of metoprolol 25 MG sent to [facility] on [address].   Pt is our of medication

## 2018-04-18 ENCOUNTER — Encounter: Payer: Self-pay | Admitting: *Deleted

## 2018-04-21 ENCOUNTER — Encounter: Admission: RE | Payer: Self-pay | Source: Ambulatory Visit

## 2018-04-21 ENCOUNTER — Ambulatory Visit (INDEPENDENT_AMBULATORY_CARE_PROVIDER_SITE_OTHER): Payer: No Typology Code available for payment source | Admitting: Family Medicine

## 2018-04-21 ENCOUNTER — Encounter: Payer: Self-pay | Admitting: Family Medicine

## 2018-04-21 ENCOUNTER — Ambulatory Visit
Admission: RE | Admit: 2018-04-21 | Payer: Managed Care, Other (non HMO) | Source: Ambulatory Visit | Admitting: Unknown Physician Specialty

## 2018-04-21 VITALS — BP 104/72 | HR 59 | Temp 98.2°F | Wt 193.2 lb

## 2018-04-21 DIAGNOSIS — R011 Cardiac murmur, unspecified: Secondary | ICD-10-CM | POA: Diagnosis not present

## 2018-04-21 DIAGNOSIS — E119 Type 2 diabetes mellitus without complications: Secondary | ICD-10-CM | POA: Diagnosis not present

## 2018-04-21 DIAGNOSIS — I10 Essential (primary) hypertension: Secondary | ICD-10-CM | POA: Diagnosis not present

## 2018-04-21 DIAGNOSIS — M722 Plantar fascial fibromatosis: Secondary | ICD-10-CM

## 2018-04-21 HISTORY — DX: Sleep apnea, unspecified: G47.30

## 2018-04-21 HISTORY — DX: Anxiety disorder, unspecified: F41.9

## 2018-04-21 SURGERY — COLONOSCOPY WITH PROPOFOL
Anesthesia: General

## 2018-04-21 MED ORDER — GLYBURIDE 5 MG PO TABS: ORAL_TABLET | ORAL | 3 refills | Status: DC

## 2018-04-21 NOTE — Patient Instructions (Signed)
Plantar Fasciitis Rehab Ask your health care provider which exercises are safe for you. Do exercises exactly as told by your health care provider and adjust them as directed. It is normal to feel mild stretching, pulling, tightness, or discomfort as you do these exercises, but you should stop right away if you feel sudden pain or your pain gets worse. Do not begin these exercises until told by your health care provider. Stretching and range of motion exercises These exercises warm up your muscles and joints and improve the movement and flexibility of your foot. These exercises also help to relieve pain. Exercise A: Plantar fascia stretch  1. Sit with your left / right leg crossed over your opposite knee. 2. Hold your heel with one hand with that thumb near your arch. With your other hand, hold your toes and gently pull them back toward the top of your foot. You should feel a stretch on the bottom of your toes or your foot or both. 3. Hold this stretch for____10______ seconds. 4. Slowly release your toes and return to the starting position. Repeat ____5_____ times. Complete this exercise ______2-3____ times a day. Exercise B: Gastroc, standing  1. Stand with your hands against a wall. 2. Extend your left / right leg behind you, and bend your front knee slightly. 3. Keeping your heels on the floor and keeping your back knee straight, shift your weight toward the wall without arching your back. You should feel a gentle stretch in your left / right calf. 4. Hold this position for ____10______ seconds. Repeat ___5_______ times. Complete this exercise ____2______ times a day. Exercise C: Soleus, standing 1. Stand with your hands against a wall. 2. Extend your left / right leg behind you, and bend your front knee slightly. 3. Keeping your heels on the floor, bend your back knee and slightly shift your weight over the back leg. You should feel a gentle stretch deep in your calf. 4. Hold this position  for ______10____ seconds. Repeat ______5____ times. Complete this exercise _____2_____ times a day. Exercise D: Gastrocsoleus, standing 1. Stand with the ball of your left / right foot on a step. The ball of your foot is on the walking surface, right under your toes. 2. Keep your other foot firmly on the same step. 3. Hold onto the wall or a railing for balance. 4. Slowly lift your other foot, allowing your body weight to press your heel down over the edge of the step. You should feel a stretch in your left / right calf. 5. Hold this position for ____10______ seconds. 6. Return both feet to the step. 7. Repeat this exercise with a slight bend in your left / right knee. Repeat ______5____ times with your left / right knee straight and ____5______ times with your left / right knee bent. Complete this exercise ______2____ times a day. Balance exercise This exercise builds your balance and strength control of your arch to help take pressure off your plantar fascia. Exercise E: Single leg stand 1. Without shoes, stand near a railing or in a doorway. You may hold onto the railing or door frame as needed. 2. Stand on your left / right foot. Keep your big toe down on the floor and try to keep your arch lifted. Do not let your foot roll inward. 3. Hold this position for ______10____ seconds. 4. If this exercise is too easy, you can try it with your eyes closed or while standing on a pillow. Repeat ____5______ times. Complete  this exercise ____2______ times a day. This information is not intended to replace advice given to you by your health care provider. Make sure you discuss any questions you have with your health care provider. Document Released: 12/03/2005 Document Revised: 08/07/2016 Document Reviewed: 10/17/2015 Elsevier Interactive Patient Education  2018 [place]. Plantar Fasciitis Plantar fasciitis is a painful foot condition that affects the heel. It occurs when the band of tissue that  connects the toes to the heel bone (plantar fascia) becomes irritated. This can happen after exercising too much or doing other repetitive activities (overuse injury). The pain from plantar fasciitis can range from mild irritation to severe pain that makes it difficult for you to walk or move. The pain is usually worse in the morning or after you have been sitting or lying down for a while. What are the causes? This condition may be caused by:  Standing for long periods of time.  Wearing shoes that do not fit.  Doing high-impact activities, including running, aerobics, and ballet.  Being overweight.  Having an abnormal way of walking (gait).  Having tight calf muscles.  Having high arches in your feet.  Starting a new athletic activity.  What are the signs or symptoms? The main symptom of this condition is heel pain. Other symptoms include:  Pain that gets worse after activity or exercise.  Pain that is worse in the morning or after resting.  Pain that goes away after you walk for a few minutes.  How is this diagnosed? This condition may be diagnosed based on your signs and symptoms. Your health care provider will also do a physical exam to check for:  A tender area on the bottom of your foot.  A high arch in your foot.  Pain when you move your foot.  Difficulty moving your foot.  You may also need to have imaging studies to confirm the diagnosis. These can include:  X-rays.  Ultrasound.  MRI.  How is this treated? Treatment for plantar fasciitis depends on the severity of the condition. Your treatment may include:  Rest, ice, and over-the-counter pain medicines to manage your pain.  Exercises to stretch your calves and your plantar fascia.  A splint that holds your foot in a stretched, upward position while you sleep (night splint).  Physical therapy to relieve symptoms and prevent problems in the future.  Cortisone injections to relieve severe  pain.  Extracorporeal shock wave therapy (ESWT) to stimulate damaged plantar fascia with electrical impulses. It is often used as a last resort before surgery.  Surgery, if other treatments have not worked after 12 months.  Follow these instructions at home:  Take medicines only as directed by your health care provider.  Avoid activities that cause pain.  Roll the bottom of your foot over a bag of ice or a bottle of cold water. Do this for 20 minutes, 3-4 times a day.  Perform simple stretches as directed by your health care provider.  Try wearing athletic shoes with air-sole or gel-sole cushions or soft shoe inserts.  Wear a night splint while sleeping, if directed by your health care provider.  Keep all follow-up appointments with your health care provider. How is this prevented?  Do not perform exercises or activities that cause heel pain.  Consider finding low-impact activities if you continue to have problems.  Lose weight if you need to. The best way to prevent plantar fasciitis is to avoid the activities that aggravate your plantar fascia. Contact a health  care provider if:  Your symptoms do not go away after treatment with home care measures.  Your pain gets worse.  Your pain affects your ability to move or do your daily activities. This information is not intended to replace advice given to you by your health care provider. Make sure you discuss any questions you have with your health care provider. Document Released: 08/28/2001 Document Revised: 05/07/2016 Document Reviewed: 10/13/2014 Elsevier Interactive Patient Education  [employer].

## 2018-04-21 NOTE — Progress Notes (Signed)
Patient: Emily Tapia Female    DOB: Apr 02, 1961   57 y.o.   MRN: [id number] Visit Date: 04/21/2018  Today's Provider: Vernie Murders, PA   Chief Complaint  Patient presents with  . Hyperlipidemia  . Hypertension  . Diabetes  . Follow-up   Subjective:    Leg Pain   Incident onset: 2 weeks ago. There was no injury mechanism. The pain is present in the right foot. The quality of the pain is described as burning and aching. The pain has been worsening since onset. The symptoms are aggravated by weight bearing. She has tried nothing for the symptoms.     Diabetes Mellitus Type II, Follow-up:   RecentLabs       Lab Results  Component Value Date   HGBA1C 6.9 (H) 07/23/2017   HGBA1C 8.0 (H) 03/15/2017   HGBA1C 7.5 (H) 08/31/2016     Last seen for diabetes 4 months ago.  Management since then includes continue medication, diet and exercise. Patient advised to follow up with Endo to decide on any medication adjustments due to elevated A1C. She reports poor compliance with treatment. She did not follow up with Endo She is not having side effects.  Current symptoms include none  Home blood sugar records: not checking. Glucometer not working. Patient states she bought a new meter but hasn't used it yet.  Episodes of hypoglycemia? unknown              Current Insulin Regimen: none Most Recent Eye Exam: 01/08/18 Weight trend: stable Current diet: in general, a "healthy" diet   Current exercise: cardiovascular workout on exercise equipment    Lipid/Cholesterol, Follow-up:   Last seen for this 4 months ago.  Management changes since that visit include no changes. Patient advised she may need statin therapy due to elevated LDL and Triglycerides and low HDL.  Last Lipid Panel:    Component Value Date/Time   CHOL 196 12/24/2017 0925   TRIG 156 (H) 12/24/2017 0925   HDL 38 (L) 12/24/2017 0925   CHOLHDL 5.2 (H) 12/24/2017 0925   LDLCALC 127 (H) 12/24/2017  5366    Risk factors for vascular disease include hypertension  She reports good compliance with treatment. She is not having side effects.  Current symptoms include none Weight trend: stable Prior visit with dietician: yes - several years ago Current diet: in general, a "healthy" diet   Current exercise: cardiovascular workout on exercise equipment  Wt Readings from Last 3 Encounters:  04/21/18 193 lb 3.2 oz (87.6 kg)  12/24/17 198 lb 12.8 oz (90.2 kg)  07/19/17 199 lb (90.3 kg)    -------------------------------------------------------------------     Past Medical History:  Diagnosis Date  . Anxiety   . CHF (congestive heart failure) ([place])   . Diabetes mellitus   . Edema   . Heart murmur   . Hypertension   . Sleep apnea   . Thyroid disease    Past Surgical History:  Procedure Laterality Date  . ABDOMINAL HYSTERECTOMY  11/17/2013  . BREAST BIOPSY     x2  . CESAREAN SECTION     x 2  . CHOLECYSTECTOMY  2003   DUE TO STONES  . DILATION AND CURETTAGE OF UTERUS    . ELBOW SURGERY    . POLYPECTOMY  1995   Family History  Problem Relation Age of Onset  . Hyperlipidemia Mother   . Thyroid disease Mother   . Hashimoto's thyroiditis Mother   .  Heart disease Father        open heart surgery  . Heart failure Father   . Diabetes Father   . Dementia Father   . Thyroid disease Brother   . Breast cancer Maternal Grandmother   . Diabetes Paternal Grandmother   . Prostate cancer Paternal Grandfather    No Known Allergies  Current Outpatient Medications:  .  aspirin 81 MG EC tablet, Take 81 mg by mouth at bedtime. , Disp: , Rfl:  .  Cholecalciferol (VITAMIN D3) 1000 UNITS CAPS, Take 1 capsule by mouth every morning. , Disp: , Rfl:  .  fish oil-omega-3 fatty acids 1000 MG capsule, Take 1 g by mouth 2 (two) times daily. , Disp: , Rfl:  .  furosemide (LASIX) 40 MG tablet, TAKE 1 TABLET BY MOUTH ONCE DAILY AS NEEDED FOR FLUID OR EDEMA, Disp: 30 tablet, Rfl: 1 .   Glucosamine 500 MG CAPS, Take 500 mg by mouth 2 (two) times daily., Disp: , Rfl:  .  glyBURIDE (DIABETA) 5 MG tablet, TAKE 1 TABLET BY MOUTH TWICE DAILY WITH A MEAL, Disp: 60 tablet, Rfl: 6 .  levothyroxine (SYNTHROID, LEVOTHROID) 75 MCG tablet, Take 1 tablet (75 mcg total) by mouth daily before breakfast., Disp: 90 tablet, Rfl: 3 .  lisinopril-hydrochlorothiazide (PRINZIDE,ZESTORETIC) 20-25 MG tablet, Take 1 tablet by mouth daily., Disp: 90 tablet, Rfl: 3 .  metFORMIN (GLUCOPHAGE) 1000 MG tablet, TAKE ONE TABLET BY MOUTH TWICE DAILY WITH A MEAL, Disp: 180 tablet, Rfl: 3 .  metoprolol tartrate (LOPRESSOR) 25 MG tablet, Take 1 tablet (25 mg total) by mouth 3 (three) times daily., Disp: 270 tablet, Rfl: 1 .  Multiple Vitamin (MULTIVITAMIN) tablet, Take 1 tablet by mouth every morning. , Disp: , Rfl:  .  PARoxetine (PAXIL) 20 MG tablet, Take 1 tablet (20 mg total) by mouth daily., Disp: 90 tablet, Rfl: 3 .  pravastatin (PRAVACHOL) 40 MG tablet, TAKE ONE TABLET BY MOUTH ONCE DAILY (Patient not taking: Reported on 04/21/2018), Disp: 90 tablet, Rfl: 0 .  VICTOZA 18 MG/3ML SOPN, INJECT SUBCUTANEOUSLY 1.2MG  DAILY (Patient not taking: Reported on 04/21/2018), Disp: 18 mL, Rfl: 3  Review of Systems  Constitutional: Negative.   Respiratory: Negative.   Cardiovascular: Negative.   Musculoskeletal:       Right foot pain    Social History   Tobacco Use  . Smoking status: Former Smoker    Packs/day: 1.50    Years: 25.00    Pack years: 37.50    Types: Cigarettes    Last attempt to quit: 11/24/2001    Years since quitting: 16.4  . Smokeless tobacco: Never Used  Substance Use Topics  . Alcohol use: Yes    Alcohol/week: 0.0 oz    Comment: OCCASIONALLY DRINKS WINE, ONCE A WEEK   Objective:   BP 104/72 (BP Location: Right Arm, Patient Position: Sitting, Cuff Size: Normal)   Pulse (!) 59   Temp 98.2 F (36.8 C) (Oral)   Wt 193 lb 3.2 oz (87.6 kg)   SpO2 97%   BMI 34.22 kg/m   Physical Exam    Constitutional: She is oriented to person, place, and time. She appears well-developed and well-nourished. No distress.  HENT:  Head: Normocephalic and atraumatic.  Right Ear: Hearing normal.  Left Ear: Hearing normal.  Nose: Nose normal.  Eyes: Conjunctivae and lids are normal. Right eye exhibits no discharge. Left eye exhibits no discharge. No scleral icterus.  Cardiovascular: Normal rate.  Murmur heard. Grade 0-0/9 systolic murmur.  Pulmonary/Chest: Effort normal. No respiratory distress.  Musculoskeletal: Normal range of motion. She exhibits tenderness.  Pain in the right heel - plantar surface to dorsiflex the ankle/foot.   Neurological: She is alert and oriented to person, place, and time.  Skin: Skin is intact. No lesion and no rash noted.  Psychiatric: She has a normal mood and affect. Her speech is normal and behavior is normal. Thought content normal.      Assessment & Plan:     1. Type 2 diabetes mellitus without complication, without long-term current use of insulin (HCC) Feeling well. Continues to take Glyburide 5 mg BID and Metformin 1000 mg BID. Stopped the Victoza due to change in insurance not covering it any more and some GI upset. Has been working on regular exercise and lost 5 lbs since 12-24-17. Will recheck labs and see if Ozempic or Trulicity needed. Denies polyuria, polydipsia, vision changes or peripheral neuropathy symptoms. - CBC with Differential/Platelet - Comprehensive metabolic panel - Lipid panel - Hemoglobin A1c - glyBURIDE (DIABETA) 5 MG tablet; TAKE 1 TABLET BY MOUTH TWICE DAILY WITH A MEAL  Dispense: 180 tablet; Refill: 3  2. Essential hypertension Tolerating Metoprolol Tartrate 25 mg TID and Lisinopril-HCTZ 20-25 mg qd. Stable and well controlled BP today. Recheck labs and follow up pending reports. - CBC with Differential/Platelet - Comprehensive metabolic panel - Lipid panel  3. Plantar fasciitis of right foot Pain in the right heel the past 2  weeks No known injury. Worse by the end of the day. Recommend NSAID, rehab exercises, ice massage and schedule podiatry referral. - Ambulatory referral to Podiatry  4. Cardiac murmur Unchanged murmur without chest pains or dyspnea unless she had swelling of feet and ankles. History of aortic valve stenosis (cardiologist - Dr. Rockey Situ). Recommend follow up with cardiologist regularly.       Vernie Murders, PA  [facility]

## 2018-04-22 ENCOUNTER — Telehealth: Payer: Self-pay

## 2018-04-22 LAB — CBC WITH DIFFERENTIAL/PLATELET
BASOS ABS: 0 10*3/uL (ref 0.0–0.2)
Basos: 1 %
EOS (ABSOLUTE): 0.1 10*3/uL (ref 0.0–0.4)
Eos: 2 %
HEMATOCRIT: 38.2 % (ref 34.0–46.6)
Hemoglobin: 11.7 g/dL (ref 11.1–15.9)
IMMATURE GRANS (ABS): 0 10*3/uL (ref 0.0–0.1)
Immature Granulocytes: 0 %
LYMPHS ABS: 1.8 10*3/uL (ref 0.7–3.1)
Lymphs: 32 %
MCH: 24 pg — ABNORMAL LOW (ref 26.6–33.0)
MCHC: 30.6 g/dL — ABNORMAL LOW (ref 31.5–35.7)
MCV: 78 fL — AB (ref 79–97)
Monocytes Absolute: 0.3 10*3/uL (ref 0.1–0.9)
Monocytes: 5 %
NEUTROS ABS: 3.4 10*3/uL (ref 1.4–7.0)
Neutrophils: 60 %
Platelets: 141 10*3/uL — ABNORMAL LOW (ref 150–379)
RBC: 4.87 x10E6/uL (ref 3.77–5.28)
RDW: 15.8 % — AB (ref 12.3–15.4)
WBC: 5.6 10*3/uL (ref 3.4–10.8)

## 2018-04-22 LAB — HEMOGLOBIN A1C
Est. average glucose Bld gHb Est-mCnc: 174 mg/dL
HEMOGLOBIN A1C: 7.7 % — AB (ref 4.8–5.6)

## 2018-04-22 LAB — COMPREHENSIVE METABOLIC PANEL
ALT: 39 IU/L — AB (ref 0–32)
AST: 30 IU/L (ref 0–40)
Albumin/Globulin Ratio: 2 (ref 1.2–2.2)
Albumin: 4.4 g/dL (ref 3.5–5.5)
Alkaline Phosphatase: 68 IU/L (ref 39–117)
BUN/Creatinine Ratio: 25 — ABNORMAL HIGH (ref 9–23)
BUN: 15 mg/dL (ref 6–24)
Bilirubin Total: 0.3 mg/dL (ref 0.0–1.2)
CO2: 26 mmol/L (ref 20–29)
CREATININE: 0.6 mg/dL (ref 0.57–1.00)
Calcium: 8.9 mg/dL (ref 8.7–10.2)
Chloride: 98 mmol/L (ref 96–106)
GFR, EST AFRICAN AMERICAN: 117 mL/min/{1.73_m2} (ref >59)
GFR, EST NON AFRICAN AMERICAN: 102 mL/min/{1.73_m2} (ref >59)
GLOBULIN, TOTAL: 2.2 g/dL (ref 1.5–4.5)
Glucose: 193 mg/dL — ABNORMAL HIGH (ref 65–99)
Potassium: 3.8 mmol/L (ref 3.5–5.2)
SODIUM: 142 mmol/L (ref 134–144)
TOTAL PROTEIN: 6.6 g/dL (ref 6.0–8.5)

## 2018-04-22 LAB — LIPID PANEL
CHOL/HDL RATIO: 4.7 ratio — AB (ref 0.0–4.4)
Cholesterol, Total: 184 mg/dL (ref 100–199)
HDL: 39 mg/dL — AB (ref >39)
LDL Calculated: 118 mg/dL — ABNORMAL HIGH (ref 0–99)
TRIGLYCERIDES: 136 mg/dL (ref 0–149)
VLDL Cholesterol Cal: 27 mg/dL (ref 5–40)

## 2018-04-22 MED ORDER — SEMAGLUTIDE(0.25 OR 0.5MG/DOS) 2 MG/1.5ML ~~LOC~~ SOPN: 0.2500 mg | PEN_INJECTOR | SUBCUTANEOUS | 1 refills | Status: DC

## 2018-04-22 NOTE — Telephone Encounter (Signed)
Left message to call back. Lab tests added. Medication has not been sent in yet.

## 2018-04-22 NOTE — Telephone Encounter (Signed)
Advised patient of results. Medication was sent into the pharmacy.  

## 2018-04-22 NOTE — Telephone Encounter (Signed)
-----   Message from Margo Common, [place] sent at 04/22/2018  8:26 AM EDT ----- Microcytosis with hypochromia on blood indices (MCV,MCH,MCHC) usually indicates need for iron supplement. Ask lab to run serum iron and IBC. Blood sugar and Hgb A1C higher than 3 months ago. May try Ozempic 0.25 mg Pixley injection once a week #1 pen and recheck blood sugar each morning. Schedule follow up in 1 month.

## 2018-04-24 ENCOUNTER — Telehealth: Payer: Self-pay

## 2018-04-24 ENCOUNTER — Other Ambulatory Visit: Payer: Self-pay

## 2018-04-24 MED ORDER — FERROUS SULFATE 325 (65 FE) MG PO TABS: 325.0000 mg | ORAL_TABLET | Freq: Every day | ORAL | 3 refills | Status: DC

## 2018-04-24 NOTE — Telephone Encounter (Signed)
-----   Message from Margo Common, [place] sent at 04/24/2018  8:27 AM EDT ----- Iron saturation lower than a year ago. Need Ferrous Sulfate 325 mg qd #30 & 3 RF. Recheck levels in 3 months at diabetes follow up appointment.

## 2018-04-24 NOTE — Telephone Encounter (Signed)
LMTCB

## 2018-04-24 NOTE — Telephone Encounter (Signed)
Should go to [employer] to print out discount coupon.

## 2018-04-24 NOTE — Telephone Encounter (Signed)
Patient advised and had message for you  New medicine is going to cost her over $400.  She is unable to do this is there something else you can do for her.  CB # [phone]

## 2018-04-24 NOTE — Progress Notes (Signed)
su

## 2018-04-25 ENCOUNTER — Telehealth: Payer: Self-pay | Admitting: Family Medicine

## 2018-04-25 NOTE — Telephone Encounter (Signed)
May check on coverage of Trulicity next.

## 2018-04-25 NOTE — Telephone Encounter (Signed)
Left patient a message advising her of message below and to call back if any further questions or concerns.

## 2018-04-25 NOTE — Telephone Encounter (Signed)
Pt called saying she was returning your Sherron Flemings) call.  Regarding medication (Ozwmpic) that she downloaded a coupon for but it was still 400.00.  She said she was going to have to use something else/  Pt's call back is [phone]  Thanks teri

## 2018-04-28 LAB — IRON AND TIBC
Iron Saturation: 10 % — ABNORMAL LOW (ref 15–55)
Iron: 40 ug/dL (ref 27–159)
TIBC: 385 ug/dL (ref 250–450)
UIBC: 345 ug/dL (ref 131–425)

## 2018-04-28 LAB — SPECIMEN STATUS REPORT

## 2018-04-28 NOTE — Telephone Encounter (Signed)
Left patient a message advising her that Simona Huh recommends she contact her insurance company to check coverage on Trulicity. Also advised if Trulicity  co-pay is to expensive to see which other medications insurance will cover then call back to advise Simona Huh.

## 2018-05-02 ENCOUNTER — Other Ambulatory Visit: Payer: Self-pay | Admitting: Family Medicine

## 2018-05-02 MED ORDER — FUROSEMIDE 40 MG PO TABS: ORAL_TABLET | ORAL | 1 refills | Status: DC

## 2018-05-02 NOTE — Telephone Encounter (Signed)
pt needs refill on her   Furosemide 40 mg  [address]  [phone]  Thanks Con Memos

## 2018-05-06 ENCOUNTER — Ambulatory Visit (INDEPENDENT_AMBULATORY_CARE_PROVIDER_SITE_OTHER): Payer: Self-pay | Admitting: Podiatry

## 2018-05-06 ENCOUNTER — Ambulatory Visit (INDEPENDENT_AMBULATORY_CARE_PROVIDER_SITE_OTHER): Payer: Self-pay

## 2018-05-06 ENCOUNTER — Encounter: Payer: Self-pay | Admitting: Podiatry

## 2018-05-06 DIAGNOSIS — M722 Plantar fascial fibromatosis: Secondary | ICD-10-CM

## 2018-05-07 MED ORDER — METHYLPREDNISOLONE 4 MG PO TABS: ORAL_TABLET | ORAL | 0 refills | Status: DC

## 2018-05-08 NOTE — Progress Notes (Signed)
   Subjective: 57 year old female presenting today as a new patient with a chief complaint of sharp, aching, pulling pain to the right heel that began 3 weeks ago. Flexing the foot increases the pain. She states the pain worsens throughout the day with walking and standing. She has been icing and stretching the foot for treatment. Patient is here for further evaluation and treatment.   Past Medical History:  Diagnosis Date  . Anxiety   . CHF (congestive heart failure) ([place])   . Diabetes mellitus   . Edema   . Heart murmur   . Hypertension   . Sleep apnea   . Thyroid disease      Objective: Physical Exam General: The patient is alert and oriented x3 in no acute distress.  Dermatology: Skin is warm, dry and supple bilateral lower extremities. Negative for open lesions or macerations bilateral.   Vascular: Dorsalis Pedis and Posterior Tibial pulses palpable bilateral.  Capillary fill time is immediate to all digits.  Neurological: Epicritic and protective threshold intact bilateral.   Musculoskeletal: Tenderness to palpation to the plantar aspect of the right heel along the plantar fascia. All other joints range of motion within normal limits bilateral. Strength 5/5 in all groups bilateral.   Radiographic exam: Normal osseous mineralization. Joint spaces preserved. No fracture/dislocation/boney destruction. No other soft tissue abnormalities or radiopaque foreign bodies.   Assessment: 1. Plantar fasciitis right 2. Pain in right foot  Plan of Care:  1. Patient evaluated. Xrays reviewed.   2. Injection of 0.5cc Celestone soluspan injected into the right plantar fascia  3. Rx for Medrol Dose pack placed 4. Rx for Meloxicam ordered for patient. 5. Instructed patient regarding therapies and modalities at home to alleviate symptoms.  6. Recommended good shoe gear.  7. Recommended stationary bike instead of treadmill.  8. Return to clinic in 4 weeks.    Works at [employer].     Edrick Kins, DPM [facility]  Dr. Edrick Kins, DPM    [address], [place] [address]                Office [phone]  Fax [phone]

## 2018-06-10 ENCOUNTER — Ambulatory Visit: Payer: Self-pay | Admitting: Podiatry

## 2018-07-25 ENCOUNTER — Other Ambulatory Visit: Payer: Self-pay | Admitting: Family Medicine

## 2018-07-25 MED ORDER — FERROUS SULFATE 325 (65 FE) MG PO TABS: 325.0000 mg | ORAL_TABLET | Freq: Every day | ORAL | 1 refills | Status: DC

## 2018-07-25 NOTE — Telephone Encounter (Signed)
[facility] faxed refill request for the following medications:  ferrous sulfate 325 (65 FE) MG tablet   90 day supply  Last Rx: 04/24/18 30 day supply with 3 refills LOV: 04/21/18 Can another provider please review request, Simona Huh is out of the office. Please advise. Thanks TNP

## 2018-08-18 ENCOUNTER — Other Ambulatory Visit: Payer: Self-pay | Admitting: Family Medicine

## 2018-08-29 ENCOUNTER — Other Ambulatory Visit: Payer: Self-pay | Admitting: Family Medicine

## 2018-10-03 ENCOUNTER — Encounter: Payer: Self-pay | Admitting: Family Medicine

## 2018-10-03 ENCOUNTER — Other Ambulatory Visit: Payer: Self-pay

## 2018-10-03 ENCOUNTER — Ambulatory Visit: Payer: No Typology Code available for payment source | Admitting: Family Medicine

## 2018-10-03 VITALS — BP 160/84 | HR 60 | Temp 98.0°F | Ht 64.0 in | Wt 194.8 lb

## 2018-10-03 DIAGNOSIS — E782 Mixed hyperlipidemia: Secondary | ICD-10-CM

## 2018-10-03 DIAGNOSIS — Z23 Encounter for immunization: Secondary | ICD-10-CM | POA: Diagnosis not present

## 2018-10-03 DIAGNOSIS — I10 Essential (primary) hypertension: Secondary | ICD-10-CM | POA: Diagnosis not present

## 2018-10-03 DIAGNOSIS — E039 Hypothyroidism, unspecified: Secondary | ICD-10-CM | POA: Diagnosis not present

## 2018-10-03 DIAGNOSIS — E1142 Type 2 diabetes mellitus with diabetic polyneuropathy: Secondary | ICD-10-CM | POA: Diagnosis not present

## 2018-10-03 MED ORDER — GLUCOSE BLOOD VI STRP: ORAL_STRIP | 4 refills | Status: AC

## 2018-10-03 NOTE — Progress Notes (Signed)
Patient: Emily Tapia Female    DOB: 1961-02-17   57 y.o.   MRN: [id number] Visit Date: 10/03/2018  Today's Provider: Vernie Murders, PA   Chief Complaint  Patient presents with  . Toe Pain    left big toe that injured on 09/13/18  . lab work needed   Subjective:    HPI  Pt reports she needs to have lab work done.  Pt also reports she has a place on her left big toe that she hit on the step seal on 09/13/18. History of diabetes without recent check of FBS due to glucometer not working. No hypoglycemic episodes. Had eye exam without diabetic retinopathy 12-29-17. Some burning, prickly pains in the top of feet.    Past Medical History:  Diagnosis Date  . Anxiety   . CHF (congestive heart failure) ([place])   . Diabetes mellitus   . Edema   . Heart murmur   . Hypertension   . Sleep apnea   . Thyroid disease    Past Surgical History:  Procedure Laterality Date  . ABDOMINAL HYSTERECTOMY  11/17/2013  . BREAST BIOPSY     x2  . CESAREAN SECTION     x 2  . CHOLECYSTECTOMY  2003   DUE TO STONES  . DILATION AND CURETTAGE OF UTERUS    . ELBOW SURGERY    . POLYPECTOMY  1995   Family History  Problem Relation Age of Onset  . Hyperlipidemia Mother   . Thyroid disease Mother   . Hashimoto's thyroiditis Mother   . Heart disease Father        open heart surgery  . Heart failure Father   . Diabetes Father   . Dementia Father   . Thyroid disease Brother   . Breast cancer Maternal Grandmother   . Diabetes Paternal Grandmother   . Prostate cancer Paternal Grandfather    No Known Allergies  Current Outpatient Medications:  .  aspirin 81 MG EC tablet, Take 81 mg by mouth at bedtime. , Disp: , Rfl:  .  Cholecalciferol (VITAMIN D3) 1000 UNITS CAPS, Take 1 capsule by mouth every morning. , Disp: , Rfl:  .  ferrous sulfate 325 (65 FE) MG tablet, Take 1 tablet (325 mg total) by mouth daily with breakfast., Disp: 90 tablet, Rfl: 1 .  fish oil-omega-3 fatty acids 1000 MG  capsule, Take 1 g by mouth 2 (two) times daily. , Disp: , Rfl:  .  furosemide (LASIX) 40 MG tablet, TAKE 1 TABLET BY MOUTH ONCE DAILY AS NEEDED FOR FLUID OR EDEMA, Disp: 90 tablet, Rfl: 1 .  Glucosamine 500 MG CAPS, Take 500 mg by mouth 2 (two) times daily., Disp: , Rfl:  .  glyBURIDE (DIABETA) 5 MG tablet, TAKE 1 TABLET BY MOUTH TWICE DAILY WITH A MEAL, Disp: 180 tablet, Rfl: 3 .  levothyroxine (SYNTHROID, LEVOTHROID) 75 MCG tablet, TAKE 1 TABLET BY MOUTH ONCE DAILY BEFORE BREAKFAST, Disp: 90 tablet, Rfl: 3 .  lisinopril-hydrochlorothiazide (PRINZIDE,ZESTORETIC) 20-25 MG tablet, Take 1 tablet by mouth daily., Disp: 90 tablet, Rfl: 3 .  metFORMIN (GLUCOPHAGE) 1000 MG tablet, TAKE 1 TABLET BY MOUTH TWICE DAILY WITH A MEAL, Disp: 180 tablet, Rfl: 0 .  metoprolol tartrate (LOPRESSOR) 25 MG tablet, Take 1 tablet (25 mg total) by mouth 3 (three) times daily., Disp: 270 tablet, Rfl: 1 .  Multiple Vitamin (MULTIVITAMIN) tablet, Take 1 tablet by mouth every morning. , Disp: , Rfl:  .  PARoxetine (PAXIL)  20 MG tablet, Take 1 tablet (20 mg total) by mouth daily., Disp: 90 tablet, Rfl: 3 .  methylPREDNISolone (MEDROL) 4 MG tablet, Take as directed, tapering dose (Patient not taking: Reported on 10/03/2018), Disp: 21 tablet, Rfl: 0 .  pravastatin (PRAVACHOL) 40 MG tablet, TAKE ONE TABLET BY MOUTH ONCE DAILY (Patient not taking: Reported on 04/21/2018), Disp: 90 tablet, Rfl: 0 .  Semaglutide (OZEMPIC) 0.25 or 0.5 MG/DOSE SOPN, Inject 0.25 mg into the skin every 7 (seven) days. (Patient not taking: Reported on 10/03/2018), Disp: 1 pen, Rfl: 1 .  VICTOZA 18 MG/3ML SOPN, INJECT SUBCUTANEOUSLY 1.2MG  DAILY (Patient not taking: Reported on 04/21/2018), Disp: 18 mL, Rfl: 3  Review of Systems  Constitutional: Negative.   HENT: Negative.   Eyes: Negative.   Respiratory: Negative.   Cardiovascular: Negative.   Gastrointestinal: Negative.   Endocrine: Negative.   Genitourinary: Negative.   Musculoskeletal: Negative.     Skin: Negative.   Allergic/Immunologic: Negative.   Neurological: Negative.   Hematological: Negative.   Psychiatric/Behavioral: Negative.    Social History   Tobacco Use  . Smoking status: Former Smoker    Packs/day: 1.50    Years: 25.00    Pack years: 37.50    Types: Cigarettes    Last attempt to quit: 11/24/2001    Years since quitting: 16.8  . Smokeless tobacco: Never Used  Substance Use Topics  . Alcohol use: Yes    Alcohol/week: 0.0 standard drinks    Comment: OCCASIONALLY DRINKS WINE, ONCE A WEEK   Objective:   BP (!) 150/110 (BP Location: Right Arm, Patient Position: Sitting, Cuff Size: Normal)   Pulse 60   Temp 98 F (36.7 C) (Oral)   Ht 5\' 4"  (1.626 m)   Wt 194 lb 12.8 oz (88.4 kg)   SpO2 98%   BMI 33.44 kg/m   Wt Readings from Last 3 Encounters:  10/03/18 194 lb 12.8 oz (88.4 kg)  04/21/18 193 lb 3.2 oz (87.6 kg)  12/24/17 198 lb 12.8 oz (90.2 kg)   Vitals:   10/03/18 0848  BP: (!) 150/110  Pulse: 60  Temp: 98 F (36.7 C)  TempSrc: Oral  SpO2: 98%  Weight: 194 lb 12.8 oz (88.4 kg)  Height: 5\' 4"  (1.626 m)   Physical Exam  Constitutional: She is oriented to person, place, and time. She appears well-developed and well-nourished. No distress.  HENT:  Head: Normocephalic and atraumatic.  Right Ear: Hearing normal.  Left Ear: Hearing normal.  Nose: Nose normal.  Eyes: Conjunctivae and lids are normal. Right eye exhibits no discharge. Left eye exhibits no discharge. No scleral icterus.  Neck: Neck supple.  Cardiovascular: Normal rate and regular rhythm.  Murmur heard. Unchanged systolic Grade 3/6 murmur with history of aortic stenosis.  Pulmonary/Chest: Effort normal and breath sounds normal. No respiratory distress.  Abdominal: Soft. Bowel sounds are normal.  Musculoskeletal: Normal range of motion.  Neurological: She is alert and oriented to person, place, and time.  Burning and prickly neuropathy pain top of both feet.  Skin: Skin is intact.  No lesion and no rash noted.  Healing avulsion laceration plantar surface of the left great toe. No sign of infection.  Psychiatric: She has a normal mood and affect. Her speech is normal and behavior is normal. Thought content normal.      Assessment & Plan:     1. Essential hypertension BP very high upon arrival to appointment. Down to 160/84 after sitting calmly for 10 minutes (has not taken  any medications today). Denies chest pains, palpitations, diaphoresis, nausea or dyspnea. Heart murmur unchanged (followed by Dr. Rockey Situ - cardiologist). Takes Metoprolol 25 mg TID and Lisinopril-HCTZ 20-25 mg qd. Advised to restrict caffeine and salt intake. Recheck BP at home and get routine follow up labs. - CBC with Differential/Platelet - Comprehensive metabolic panel - TSH - Lipid panel  2. Diabetic peripheral neuropathy associated with type 2 diabetes mellitus (HCC) No longer taking the Victoza due to cost. Presently on Metformin 1000 mg BID and Glyburide 5 mg BID without hypoglycemic episodes. Glucometer not working - given Agricultural consultant Next to check FBS daily. Neuropathy of feet unchanged. Continue annual ophthalmology exam and follow up with podiatrist for screening and treatment of plantar fasciitis. Stumped the left great toe 09-13-18 with avulsion laceration plantar surface. Healing well without signs of infection. Last tetanus booster given 01-16-13. Check routine labs. - CBC with Differential/Platelet - Comprehensive metabolic panel - Hemoglobin A1c - Lipid panel  3. Hypothyroidism, unspecified type Still taking Levothyroxine 75 mcg qd. Weight stable since May 2019, no significant heat or cold intolerance and no tremor. Some peripheral edema by the end of the day. Better control when she is able to get off her feet some on the weekends. Check CBC, CMP, TSH and T4.  - CBC with Differential/Platelet - Comprehensive metabolic panel - TSH - T4  4. Mixed hyperlipidemia Presently not taking  the Pravastatin 40 mg qd. Continues efforts to control fats in diabetic diet and exercise regularly. Recheck labs. - Comprehensive metabolic panel - TSH - Lipid panel  5. Need for influenza vaccination - Flu Vaccine QUAD 6+ mos PF IM (Fluarix Quad PF)       Vernie Murders, PA  [facility]

## 2018-10-04 LAB — CBC WITH DIFFERENTIAL/PLATELET
BASOS ABS: 0 10*3/uL (ref 0.0–0.2)
BASOS: 1 %
EOS (ABSOLUTE): 0.1 10*3/uL (ref 0.0–0.4)
Eos: 2 %
HEMOGLOBIN: 12.5 g/dL (ref 11.1–15.9)
Hematocrit: 38.9 % (ref 34.0–46.6)
IMMATURE GRANS (ABS): 0 10*3/uL (ref 0.0–0.1)
Immature Granulocytes: 0 %
LYMPHS ABS: 1.7 10*3/uL (ref 0.7–3.1)
LYMPHS: 32 %
MCH: 25.6 pg — AB (ref 26.6–33.0)
MCHC: 32.1 g/dL (ref 31.5–35.7)
MCV: 80 fL (ref 79–97)
MONOCYTES: 5 %
Monocytes Absolute: 0.3 10*3/uL (ref 0.1–0.9)
NEUTROS ABS: 3.2 10*3/uL (ref 1.4–7.0)
Neutrophils: 60 %
Platelets: 141 10*3/uL — ABNORMAL LOW (ref 150–450)
RBC: 4.89 x10E6/uL (ref 3.77–5.28)
RDW: 14.4 % (ref 12.3–15.4)
WBC: 5.3 10*3/uL (ref 3.4–10.8)

## 2018-10-04 LAB — COMPREHENSIVE METABOLIC PANEL
A/G RATIO: 2 (ref 1.2–2.2)
ALBUMIN: 4.5 g/dL (ref 3.5–5.5)
ALK PHOS: 76 IU/L (ref 39–117)
ALT: 74 IU/L — ABNORMAL HIGH (ref 0–32)
AST: 73 IU/L — ABNORMAL HIGH (ref 0–40)
BILIRUBIN TOTAL: 0.4 mg/dL (ref 0.0–1.2)
BUN / CREAT RATIO: 19 (ref 9–23)
BUN: 12 mg/dL (ref 6–24)
CHLORIDE: 97 mmol/L (ref 96–106)
CO2: 27 mmol/L (ref 20–29)
Calcium: 8.7 mg/dL (ref 8.7–10.2)
Creatinine, Ser: 0.62 mg/dL (ref 0.57–1.00)
GFR calc non Af Amer: 100 mL/min/{1.73_m2} (ref >59)
GFR, EST AFRICAN AMERICAN: 115 mL/min/{1.73_m2} (ref >59)
GLUCOSE: 204 mg/dL — AB (ref 65–99)
Globulin, Total: 2.2 g/dL (ref 1.5–4.5)
POTASSIUM: 3.8 mmol/L (ref 3.5–5.2)
SODIUM: 142 mmol/L (ref 134–144)
TOTAL PROTEIN: 6.7 g/dL (ref 6.0–8.5)

## 2018-10-04 LAB — LIPID PANEL
CHOL/HDL RATIO: 4.3 ratio (ref 0.0–4.4)
CHOLESTEROL TOTAL: 169 mg/dL (ref 100–199)
HDL: 39 mg/dL — ABNORMAL LOW (ref >39)
LDL CALC: 96 mg/dL (ref 0–99)
Triglycerides: 168 mg/dL — ABNORMAL HIGH (ref 0–149)
VLDL CHOLESTEROL CAL: 34 mg/dL (ref 5–40)

## 2018-10-04 LAB — HEMOGLOBIN A1C
Est. average glucose Bld gHb Est-mCnc: 226 mg/dL
HEMOGLOBIN A1C: 9.5 % — AB (ref 4.8–5.6)

## 2018-10-04 LAB — TSH: TSH: 4.07 u[IU]/mL (ref 0.450–4.500)

## 2018-10-04 LAB — T4: T4, Total: 10.8 ug/dL (ref 4.5–12.0)

## 2018-10-06 ENCOUNTER — Telehealth: Payer: Self-pay | Admitting: Family Medicine

## 2018-10-06 ENCOUNTER — Other Ambulatory Visit: Payer: Self-pay | Admitting: Family Medicine

## 2018-10-06 ENCOUNTER — Ambulatory Visit: Payer: Self-pay | Admitting: *Deleted

## 2018-10-06 NOTE — Telephone Encounter (Signed)
Pt returning missed call. Please call pt back on work number -  [phone].  Thanks, [employer]

## 2018-10-06 NOTE — Chronic Care Management (AMB) (Signed)
  Chronic Care Management   Note  10/06/2018 Name: Derry Arbogast MRN: [id number] DOB: 09-11-1961   Outreach to Ms. Clements by phone today to offer CCM services in response to a request by her PCP [employer]. I was unable to reach Ms. Ackroyd but left a message requesting a return call.   Plan: We will follow up with Ms. Quebedeaux by phone over the next few days.   [facility]  [phone]

## 2018-10-07 ENCOUNTER — Ambulatory Visit: Payer: Self-pay

## 2018-10-07 DIAGNOSIS — E1142 Type 2 diabetes mellitus with diabetic polyneuropathy: Secondary | ICD-10-CM

## 2018-10-07 DIAGNOSIS — E119 Type 2 diabetes mellitus without complications: Secondary | ICD-10-CM

## 2018-10-07 DIAGNOSIS — I10 Essential (primary) hypertension: Secondary | ICD-10-CM

## 2018-10-07 NOTE — Chronic Care Management (AMB) (Signed)
   Care Management   Note  10/07/2018 Name: Emily Tapia MRN: [id number] DOB: December 20, 1960    Emily Tapia is a 57 y.o. year old female who sees Emily Tapia, Emily Tapia, [place] for primary care. PCP Emily Murders PA asked the CM team to consult the patient for assistance with chronic disease management related to DM. Referral was placed 01/06/18. Today, I was able to speak with Emily Tapia by phone and offer Care Management Services.   Emily Tapia was given information about Care Management services today including:  1. Case Management services includes personalized support from designated clinical staff supervised by her physician, including individualized plan of care and coordination with other care providers 2. 24/7 contact phone numbers for assistance for urgent and routine care needs. 3. The patient may stop case management services at any time by phone call to the office staff.  Patient agreed to services and verbal consent obtained.  Plan: The CCM team will await call back from Emily Tapia for her decision on face to face initial visit vs. phone call assessment/education related to her limited availability secondary to work schedule.  Jaimin Krupka E. Rollene Rotunda, RN, BSN Nurse Care Coordinator [facility] [phone]

## 2018-10-08 ENCOUNTER — Telehealth: Payer: Self-pay

## 2018-11-10 ENCOUNTER — Other Ambulatory Visit: Payer: Self-pay | Admitting: Family Medicine

## 2018-11-20 ENCOUNTER — Other Ambulatory Visit: Payer: Self-pay | Admitting: Family Medicine

## 2018-11-20 NOTE — Telephone Encounter (Signed)
Pt needing a refill on:  metFORMIN (GLUCOPHAGE) 1000 MG tablet   Please fill at:  [facility] [place] [address] [phone] (Phone) [phone] (Fax)    Thanks, [employer]

## 2018-12-04 ENCOUNTER — Other Ambulatory Visit: Payer: Self-pay | Admitting: Family Medicine

## 2018-12-04 DIAGNOSIS — I10 Essential (primary) hypertension: Secondary | ICD-10-CM

## 2018-12-29 ENCOUNTER — Other Ambulatory Visit: Payer: Self-pay | Admitting: Family Medicine

## 2018-12-29 DIAGNOSIS — F411 Generalized anxiety disorder: Secondary | ICD-10-CM

## 2019-01-11 ENCOUNTER — Other Ambulatory Visit: Payer: Self-pay | Admitting: Family Medicine

## 2019-02-16 ENCOUNTER — Other Ambulatory Visit: Payer: Self-pay | Admitting: Family Medicine

## 2019-03-16 ENCOUNTER — Other Ambulatory Visit: Payer: Self-pay | Admitting: Family Medicine

## 2019-03-16 DIAGNOSIS — I10 Essential (primary) hypertension: Secondary | ICD-10-CM

## 2019-03-17 ENCOUNTER — Telehealth: Payer: Self-pay | Admitting: *Deleted

## 2019-03-17 NOTE — Telephone Encounter (Signed)
Patient called office concerning vertigo symptoms she has been having for over 1 week. Patient states when she laid down last night she had to vomit. Patient states today when she moves her head to quickly she is still having a swimmy headed feeling. Patient wanted to know if she should do an ov or what Simona Huh would suggest. Patient also stated her bp was 195/85 and 2nd check was 151/79 today. Please advise?

## 2019-03-17 NOTE — Telephone Encounter (Signed)
Patient states she has had some short episodes of a spinning sensation over the past few days. No cough, fever, shortness of breath, hypoglycemia, diarrhea, headaches or fainting. No further vomiting or nausea today. May try Bonine (meclizine) TID prn and call report of progress in 3 days.

## 2019-03-31 ENCOUNTER — Other Ambulatory Visit: Payer: Self-pay | Admitting: Family Medicine

## 2019-03-31 DIAGNOSIS — F411 Generalized anxiety disorder: Secondary | ICD-10-CM

## 2019-04-24 ENCOUNTER — Other Ambulatory Visit: Payer: Self-pay | Admitting: Physician Assistant

## 2019-04-24 ENCOUNTER — Other Ambulatory Visit: Payer: Self-pay | Admitting: Family Medicine

## 2019-04-24 DIAGNOSIS — E611 Iron deficiency: Secondary | ICD-10-CM

## 2019-04-24 DIAGNOSIS — E119 Type 2 diabetes mellitus without complications: Secondary | ICD-10-CM

## 2019-04-26 ENCOUNTER — Other Ambulatory Visit: Payer: Self-pay | Admitting: Family Medicine

## 2019-04-26 DIAGNOSIS — E119 Type 2 diabetes mellitus without complications: Secondary | ICD-10-CM

## 2019-04-30 ENCOUNTER — Other Ambulatory Visit: Payer: Self-pay | Admitting: Family Medicine

## 2019-05-25 ENCOUNTER — Other Ambulatory Visit: Payer: Self-pay | Admitting: Family Medicine

## 2019-06-29 ENCOUNTER — Other Ambulatory Visit: Payer: Self-pay | Admitting: Family Medicine

## 2019-06-29 DIAGNOSIS — F411 Generalized anxiety disorder: Secondary | ICD-10-CM

## 2019-07-14 ENCOUNTER — Other Ambulatory Visit: Payer: Self-pay | Admitting: Family Medicine

## 2019-07-14 DIAGNOSIS — I10 Essential (primary) hypertension: Secondary | ICD-10-CM

## 2019-07-27 ENCOUNTER — Other Ambulatory Visit: Payer: Self-pay | Admitting: Family Medicine

## 2019-07-27 DIAGNOSIS — E119 Type 2 diabetes mellitus without complications: Secondary | ICD-10-CM

## 2019-08-04 ENCOUNTER — Ambulatory Visit
Admission: RE | Admit: 2019-08-04 | Discharge: 2019-08-04 | Disposition: A | Discharge status: Home / Self Care | Payer: No Typology Code available for payment source | Source: Ambulatory Visit | Attending: Physician Assistant | Admitting: Physician Assistant

## 2019-08-04 ENCOUNTER — Encounter: Payer: Self-pay | Admitting: Physician Assistant

## 2019-08-04 ENCOUNTER — Other Ambulatory Visit: Payer: Self-pay

## 2019-08-04 ENCOUNTER — Ambulatory Visit: Payer: PRIVATE HEALTH INSURANCE | Admitting: Physician Assistant

## 2019-08-04 VITALS — BP 154/83 | HR 67 | Temp 97.8°F | Resp 16 | Ht 64.0 in | Wt 197.0 lb

## 2019-08-04 DIAGNOSIS — E1140 Type 2 diabetes mellitus with diabetic neuropathy, unspecified: Secondary | ICD-10-CM | POA: Diagnosis not present

## 2019-08-04 DIAGNOSIS — E11628 Type 2 diabetes mellitus with other skin complications: Secondary | ICD-10-CM

## 2019-08-04 DIAGNOSIS — L089 Local infection of the skin and subcutaneous tissue, unspecified: Secondary | ICD-10-CM

## 2019-08-04 MED ORDER — AMOXICILLIN-POT CLAVULANATE 875-125 MG PO TABS: 1.0000 | ORAL_TABLET | Freq: Two times a day (BID) | ORAL | 0 refills | Status: DC

## 2019-08-04 NOTE — Progress Notes (Signed)
Patient: Emily Tapia Female    DOB: 1961-08-14   58 y.o.   MRN: [id number] Visit Date: 08/04/2019  Today's Provider: Trinna Post, PA-C   Chief Complaint  Patient presents with  . Foot Pain   Subjective:     HPI   Patient with history of diabetes II, last A1c 09/2018 was 9.5%, has had pain and swelling in 4 right toe for 3 days. Toe is also red. Patient states she did not injure her foot. Her toe just started hurting. Patient has been taking ibuprofen and used diabetic foot cream with mild relief. Has diabetic neuropathy. Has some shooting pain. Denies fever, chills, nausea and vomiting. A little trouble walking.   Wt Readings from Last 3 Encounters:  08/04/19 197 lb (89.4 kg)  10/03/18 194 lb 12.8 oz (88.4 kg)  04/21/18 193 lb 3.2 oz (87.6 kg)    No Known Allergies   Current Outpatient Medications:  .  aspirin 81 MG EC tablet, Take 81 mg by mouth at bedtime. , Disp: , Rfl:  .  Cholecalciferol (VITAMIN D3) 1000 UNITS CAPS, Take 1 capsule by mouth every morning. , Disp: , Rfl:  .  Ferrous Sulfate (IRON) 325 (65 Fe) MG TABS, Take 1 tablet by mouth once daily with breakfast, Disp: 90 tablet, Rfl: 0 .  furosemide (LASIX) 40 MG tablet, TAKE 1 TABLET BY MOUTH ONCE DAILY AS NEEDED FOR FLUID OR EDEMA, Disp: 90 tablet, Rfl: 1 .  Glucosamine 500 MG CAPS, Take 500 mg by mouth 2 (two) times daily., Disp: , Rfl:  .  glucose blood (CONTOUR NEXT TEST) test strip, Test fasting sugar each morning. Recheck if having hypoglycemic symptoms., Disp: 100 each, Rfl: 4 .  glyBURIDE (DIABETA) 5 MG tablet, TAKE 1 TABLET BY MOUTH TWICE DAILY WITH A MEAL. SCHEDULE FOLLOW UP APPOINTMENT OR VIRTUAL VISIT AND LABS IN THE NEXT 30 DAYS., Disp: 180 tablet, Rfl: 0 .  levothyroxine (SYNTHROID, LEVOTHROID) 75 MCG tablet, TAKE 1 TABLET BY MOUTH ONCE DAILY BEFORE BREAKFAST, Disp: 90 tablet, Rfl: 3 .  lisinopril-hydrochlorothiazide (ZESTORETIC) 20-25 MG tablet, Take 1 tablet by mouth once daily, Disp:  90 tablet, Rfl: 0 .  metFORMIN (GLUCOPHAGE) 1000 MG tablet, TAKE 1 TABLET BY MOUTH TWICE DAILY WITH A MEAL. PATIENT IS DUE FOR FOLLOW UP APPOINTMENT AND LABS., Disp: 180 tablet, Rfl: 0 .  metoprolol tartrate (LOPRESSOR) 25 MG tablet, TAKE 1 TABLET BY MOUTH THREE TIMES DAILY. SCHEDULE FOLLOW UP APPOINTMENT IN THE NEXT ONE TO TWO MONTHS., Disp: 270 tablet, Rfl: 0 .  Multiple Vitamin (MULTIVITAMIN) tablet, Take 1 tablet by mouth every morning. , Disp: , Rfl:  .  PARoxetine (PAXIL) 20 MG tablet, TAKE 1 TABLET BY MOUTH ONCE DAILY. SCHEDULE FOLLOW UP AND LABS IN THE NEXT 4 TO 6 WEEKS, Disp: 90 tablet, Rfl: 0 .  fish oil-omega-3 fatty acids 1000 MG capsule, Take 1 g by mouth 2 (two) times daily. , Disp: , Rfl:  .  pravastatin (PRAVACHOL) 40 MG tablet, TAKE ONE TABLET BY MOUTH ONCE DAILY (Patient not taking: Reported on 04/21/2018), Disp: 90 tablet, Rfl: 0 .  Semaglutide (OZEMPIC) 0.25 or 0.5 MG/DOSE SOPN, Inject 0.25 mg into the skin every 7 (seven) days. (Patient not taking: Reported on 10/03/2018), Disp: 1 pen, Rfl: 1 .  VICTOZA 18 MG/3ML SOPN, INJECT SUBCUTANEOUSLY 1.2MG  DAILY (Patient not taking: Reported on 04/21/2018), Disp: 18 mL, Rfl: 3  Review of Systems  Constitutional: Negative for appetite change, chills, fatigue and  fever.  Respiratory: Negative for chest tightness and shortness of breath.   Cardiovascular: Negative for chest pain and palpitations.  Gastrointestinal: Negative for abdominal pain, nausea and vomiting.  Neurological: Negative for dizziness and weakness.    Social History   Tobacco Use  . Smoking status: Former Smoker    Packs/day: 1.50    Years: 25.00    Pack years: 37.50    Types: Cigarettes    Quit date: 11/24/2001    Years since quitting: 17.7  . Smokeless tobacco: Never Used  Substance Use Topics  . Alcohol use: Yes    Alcohol/week: 0.0 standard drinks    Comment: OCCASIONALLY DRINKS WINE, ONCE A WEEK      Objective:   BP (!) 154/83 (BP Location: Right Arm,  Patient Position: Sitting, Cuff Size: Large)   Pulse 67   Temp 97.8 F (36.6 C) (Temporal)   Resp 16   Ht 5\' 4"  (1.626 m)   Wt 197 lb (89.4 kg)   SpO2 97%   BMI 33.81 kg/m  Vitals:   08/04/19 0934  BP: (!) 154/83  Pulse: 67  Resp: 16  Temp: 97.8 F (36.6 C)  TempSrc: Temporal  SpO2: 97%  Weight: 197 lb (89.4 kg)  Height: 5\' 4"  (1.626 m)     Physical Exam Constitutional:      Appearance: Normal appearance.  Feet:     Right foot:     Skin integrity: Erythema and warmth present. No ulcer or blister.     Left foot:     Skin integrity: No ulcer, blister, erythema or warmth.     Comments: Right fourth toe is erythematous, warm and tender to palpation. Cap refill < 3 seconds.  Skin:    General: Skin is warm and dry.  Neurological:     Mental Status: She is alert and oriented to person, place, and time. Mental status is at baseline.  Psychiatric:        Mood and Affect: Mood normal.        Behavior: Behavior normal.     Media Information    Document Information  Photos  Right foot   08/04/2019 09:47  Attached To:  Office Visit on 08/04/19 with Trinna Post, PA-C  Source Information  Carles Collet M, PA-C  [facility]    No results found for any visits on 08/04/19.     Assessment & Plan    1. Type 2 diabetes mellitus with diabetic neuropathy, unspecified whether long term insulin use (HCC)  Treat for cellulitis associated with diabetes. Xray as below to r/o osteo. Follow up in 2 days. Strict return precautions advised.   - amoxicillin-clavulanate (AUGMENTIN) 875-125 MG tablet; Take 1 tablet by mouth 2 (two) times daily for 10 days.  Dispense: 20 tablet; Refill: 0  2. Diabetic infection of right foot (HCC)  - amoxicillin-clavulanate (AUGMENTIN) 875-125 MG tablet; Take 1 tablet by mouth 2 (two) times daily for 10 days.  Dispense: 20 tablet; Refill: 0 - DG Foot Complete Right; Future  The entirety of the information documented in the  History of Present Illness, Review of Systems and Physical Exam were personally obtained by me. Portions of this information were initially documented by April M. Sabra Heck, CMA and reviewed by me for thoroughness and accuracy.      Trinna Post, PA-C  [facility]

## 2019-08-04 NOTE — Patient Instructions (Signed)

## 2019-08-06 ENCOUNTER — Ambulatory Visit (INDEPENDENT_AMBULATORY_CARE_PROVIDER_SITE_OTHER): Payer: PRIVATE HEALTH INSURANCE | Admitting: Physician Assistant

## 2019-08-06 ENCOUNTER — Other Ambulatory Visit: Payer: Self-pay

## 2019-08-06 DIAGNOSIS — L03818 Cellulitis of other sites: Secondary | ICD-10-CM | POA: Diagnosis not present

## 2019-08-06 DIAGNOSIS — E1140 Type 2 diabetes mellitus with diabetic neuropathy, unspecified: Secondary | ICD-10-CM

## 2019-08-06 MED ORDER — TRULICITY 0.75 MG/0.5ML ~~LOC~~ SOAJ: 0.7500 mg | SUBCUTANEOUS | 2 refills | Status: DC

## 2019-08-06 NOTE — Progress Notes (Signed)
Patient: Emily Tapia Female    DOB: 1961/05/10   58 y.o.   MRN: [id number] Visit Date: 08/06/2019  Today's Provider: Trinna Post, PA-C   Chief Complaint  Patient presents with  . Cellulitis  . Diabetes   Subjective:    Virtual Visit via Video Note  I connected with [name] on 08/06/19 at  8:00 AM EDT by a video enabled telemedicine application and verified that I am speaking with the correct person using two identifiers.   I discussed the limitations of evaluation and management by telemedicine and the availability of in person appointments. The patient expressed understanding and agreed to proceed.  Patient location: home Provider location: [place] office  Persons involved in the visit: patient, provider   HPI   Patient with history of uncontrolled diabetes presenting today for follow up of cellulitis on her right fourth toe. The patient was started two days ago on a 10 day course of augmentin 875-125 mg BID and she reports improved redness, swelling, and pain in her toe. Right foot XRAY did not show osteomyelitis. She denies fevers, chills, N/V.   Patient took her fasting sugars for the past two days and they are 264 and 284. She is currently taking metformin 1000 mg BID and glyburide 5 mg BID. She was previously on Victoza but then her insurance changed and this was not covered. Ozempic was similarly not covered.   No Known Allergies   Current Outpatient Medications:  .  amoxicillin-clavulanate (AUGMENTIN) 875-125 MG tablet, Take 1 tablet by mouth 2 (two) times daily for 10 days., Disp: 20 tablet, Rfl: 0 .  aspirin 81 MG EC tablet, Take 81 mg by mouth at bedtime. , Disp: , Rfl:  .  Cholecalciferol (VITAMIN D3) 1000 UNITS CAPS, Take 1 capsule by mouth every morning. , Disp: , Rfl:  .  Dulaglutide (TRULICITY) 1.24 PY/0.9XI SOPN, Inject 0.75 mg into the skin once a week., Disp: 4 pen, Rfl: 2 .  Ferrous Sulfate (IRON) 325  (65 Fe) MG TABS, Take 1 tablet by mouth once daily with breakfast, Disp: 90 tablet, Rfl: 0 .  fish oil-omega-3 fatty acids 1000 MG capsule, Take 1 g by mouth 2 (two) times daily. , Disp: , Rfl:  .  furosemide (LASIX) 40 MG tablet, TAKE 1 TABLET BY MOUTH ONCE DAILY AS NEEDED FOR FLUID OR EDEMA, Disp: 90 tablet, Rfl: 1 .  Glucosamine 500 MG CAPS, Take 500 mg by mouth 2 (two) times daily., Disp: , Rfl:  .  glucose blood (CONTOUR NEXT TEST) test strip, Test fasting sugar each morning. Recheck if having hypoglycemic symptoms., Disp: 100 each, Rfl: 4 .  glyBURIDE (DIABETA) 5 MG tablet, TAKE 1 TABLET BY MOUTH TWICE DAILY WITH A MEAL. SCHEDULE FOLLOW UP APPOINTMENT OR VIRTUAL VISIT AND LABS IN THE NEXT 30 DAYS., Disp: 180 tablet, Rfl: 0 .  levothyroxine (SYNTHROID, LEVOTHROID) 75 MCG tablet, TAKE 1 TABLET BY MOUTH ONCE DAILY BEFORE BREAKFAST, Disp: 90 tablet, Rfl: 3 .  lisinopril-hydrochlorothiazide (ZESTORETIC) 20-25 MG tablet, Take 1 tablet by mouth once daily, Disp: 90 tablet, Rfl: 0 .  metFORMIN (GLUCOPHAGE) 1000 MG tablet, TAKE 1 TABLET BY MOUTH TWICE DAILY WITH A MEAL. PATIENT IS DUE FOR FOLLOW UP APPOINTMENT AND LABS., Disp: 180 tablet, Rfl: 0 .  metoprolol tartrate (LOPRESSOR) 25 MG tablet, TAKE 1 TABLET BY MOUTH THREE TIMES DAILY. SCHEDULE FOLLOW UP APPOINTMENT IN THE NEXT ONE TO TWO MONTHS., Disp: 270 tablet, Rfl:  0 .  Multiple Vitamin (MULTIVITAMIN) tablet, Take 1 tablet by mouth every morning. , Disp: , Rfl:  .  PARoxetine (PAXIL) 20 MG tablet, TAKE 1 TABLET BY MOUTH ONCE DAILY. SCHEDULE FOLLOW UP AND LABS IN THE NEXT 4 TO 6 WEEKS, Disp: 90 tablet, Rfl: 0 .  pravastatin (PRAVACHOL) 40 MG tablet, TAKE ONE TABLET BY MOUTH ONCE DAILY (Patient not taking: Reported on 04/21/2018), Disp: 90 tablet, Rfl: 0 .  Semaglutide (OZEMPIC) 0.25 or 0.5 MG/DOSE SOPN, Inject 0.25 mg into the skin every 7 (seven) days. (Patient not taking: Reported on 10/03/2018), Disp: 1 pen, Rfl: 1 .  VICTOZA 18 MG/3ML SOPN, INJECT  SUBCUTANEOUSLY 1.2MG  DAILY (Patient not taking: Reported on 04/21/2018), Disp: 18 mL, Rfl: 3  Review of Systems  Constitutional: Negative for appetite change, chills, fatigue and fever.  Respiratory: Negative for chest tightness and shortness of breath.   Cardiovascular: Negative for chest pain and palpitations.  Gastrointestinal: Negative for abdominal pain, nausea and vomiting.  Neurological: Negative for dizziness and weakness.    Social History   Tobacco Use  . Smoking status: Former Smoker    Packs/day: 1.50    Years: 25.00    Pack years: 37.50    Types: Cigarettes    Quit date: 11/24/2001    Years since quitting: 17.7  . Smokeless tobacco: Never Used  Substance Use Topics  . Alcohol use: Yes    Alcohol/week: 0.0 standard drinks    Comment: OCCASIONALLY DRINKS WINE, ONCE A WEEK      Objective:   There were no vitals taken for this visit. There were no vitals filed for this visit.   Physical Exam Constitutional:      Appearance: Normal appearance. She is not ill-appearing.  Feet:     Comments: Right fourth toe is without erythema and swelling.  Skin:    General: Skin is warm and dry.  Neurological:     Mental Status: She is alert and oriented to person, place, and time. Mental status is at baseline.  Psychiatric:        Mood and Affect: Mood normal.        Behavior: Behavior normal.      No results found for any visits on 08/06/19.     Assessment & Plan    1. Type 2 diabetes mellitus with diabetic neuropathy, without long-term current use of insulin ([place])  Will try to start trulicity. If insurance does not cover, will increase glyburide. One month followup with Vernie Murders, PA-C for diabetes.   - Dulaglutide (TRULICITY) 6.68 DP/9.4LM SOPN; Inject 0.75 mg into the skin once a week.  Dispense: 4 pen; Refill: 2  2. Cellulitis of other specified site  Improving, please complete augmentin.        Trinna Post, PA-C  [facility]

## 2019-08-06 NOTE — Patient Instructions (Signed)
Diabetes Mellitus and Exercise Exercising regularly is important for your overall health, especially when you have diabetes (diabetes mellitus). Exercising is not only about losing weight. It has many other health benefits, such as increasing muscle strength and bone density and reducing body fat and stress. This leads to improved fitness, flexibility, and endurance, all of which result in better overall health. Exercise has additional benefits for people with diabetes, including:  Reducing appetite.  Helping to lower and control blood glucose.  Lowering blood pressure.  Helping to control amounts of fatty substances (lipids) in the blood, such as cholesterol and triglycerides.  Helping the body to respond better to insulin (improving insulin sensitivity).  Reducing how much insulin the body needs.  Decreasing the risk for heart disease by: ? Lowering cholesterol and triglyceride levels. ? Increasing the levels of good cholesterol. ? Lowering blood glucose levels. What is my activity plan? Your health care provider or certified diabetes educator can help you make a plan for the type and frequency of exercise (activity plan) that works for you. Make sure that you:  Do at least 150 minutes of moderate-intensity or vigorous-intensity exercise each week. This could be brisk walking, biking, or water aerobics. ? Do stretching and strength exercises, such as yoga or weightlifting, at least 2 times a week. ? Spread out your activity over at least 3 days of the week.  Get some form of physical activity every day. ? Do not go more than 2 days in a row without some kind of physical activity. ? Avoid being inactive for more than 30 minutes at a time. Take frequent breaks to walk or stretch.  Choose a type of exercise or activity that you enjoy, and set realistic goals.  Start slowly, and gradually increase the intensity of your exercise over time. What do I need to know about managing my  diabetes?   Check your blood glucose before and after exercising. ? If your blood glucose is 240 mg/dL (13.3 mmol/L) or higher before you exercise, check your urine for ketones. If you have ketones in your urine, do not exercise until your blood glucose returns to normal. ? If your blood glucose is 100 mg/dL (5.6 mmol/L) or lower, eat a snack containing 15-20 grams of carbohydrate. Check your blood glucose 15 minutes after the snack to make sure that your level is above 100 mg/dL (5.6 mmol/L) before you start your exercise.  Know the symptoms of low blood glucose (hypoglycemia) and how to treat it. Your risk for hypoglycemia increases during and after exercise. Common symptoms of hypoglycemia can include: ? Hunger. ? Anxiety. ? Sweating and feeling clammy. ? Confusion. ? Dizziness or feeling light-headed. ? Increased heart rate or palpitations. ? Blurry vision. ? Tingling or numbness around the mouth, lips, or tongue. ? Tremors or shakes. ? Irritability.  Keep a rapid-acting carbohydrate snack available before, during, and after exercise to help prevent or treat hypoglycemia.  Avoid injecting insulin into areas of the body that are going to be exercised. For example, avoid injecting insulin into: ? The arms, when playing tennis. ? The legs, when jogging.  Keep records of your exercise habits. Doing this can help you and your health care provider adjust your diabetes management plan as needed. Write down: ? Food that you eat before and after you exercise. ? Blood glucose levels before and after you exercise. ? The type and amount of exercise you have done. ? When your insulin is expected to peak, if you use   insulin. Avoid exercising at times when your insulin is peaking.  When you start a new exercise or activity, work with your health care provider to make sure the activity is safe for you, and to adjust your insulin, medicines, or food intake as needed.  Drink plenty of water while  you exercise to prevent dehydration or heat stroke. Drink enough fluid to keep your urine clear or pale yellow. Summary  Exercising regularly is important for your overall health, especially when you have diabetes (diabetes mellitus).  Exercising has many health benefits, such as increasing muscle strength and bone density and reducing body fat and stress.  Your health care provider or certified diabetes educator can help you make a plan for the type and frequency of exercise (activity plan) that works for you.  When you start a new exercise or activity, work with your health care provider to make sure the activity is safe for you, and to adjust your insulin, medicines, or food intake as needed. This information is not intended to replace advice given to you by your health care provider. Make sure you discuss any questions you have with your health care provider. Document Released: 02/23/2004 Document Revised: 06/27/2017 Document Reviewed: 05/14/2016 Elsevier Patient Education  2020 Elsevier Inc.  

## 2019-08-14 ENCOUNTER — Encounter: Payer: Self-pay | Admitting: Family Medicine

## 2019-08-14 ENCOUNTER — Ambulatory Visit (INDEPENDENT_AMBULATORY_CARE_PROVIDER_SITE_OTHER): Payer: PRIVATE HEALTH INSURANCE | Admitting: Family Medicine

## 2019-08-14 ENCOUNTER — Other Ambulatory Visit: Payer: Self-pay

## 2019-08-14 VITALS — BP 128/76 | HR 64 | Temp 98.8°F | Resp 16 | Wt 195.0 lb

## 2019-08-14 DIAGNOSIS — E782 Mixed hyperlipidemia: Secondary | ICD-10-CM

## 2019-08-14 DIAGNOSIS — L03818 Cellulitis of other sites: Secondary | ICD-10-CM | POA: Diagnosis not present

## 2019-08-14 DIAGNOSIS — E1140 Type 2 diabetes mellitus with diabetic neuropathy, unspecified: Secondary | ICD-10-CM | POA: Diagnosis not present

## 2019-08-14 DIAGNOSIS — I10 Essential (primary) hypertension: Secondary | ICD-10-CM

## 2019-08-14 DIAGNOSIS — R011 Cardiac murmur, unspecified: Secondary | ICD-10-CM

## 2019-08-14 DIAGNOSIS — E039 Hypothyroidism, unspecified: Secondary | ICD-10-CM

## 2019-08-14 NOTE — Progress Notes (Signed)
Emily Tapia  MRN: [id number] DOB: February 27, 1961  Subjective:  HPI   The patient is a 58 year old female who presents today for follow up of her uncontrolled diabetes.  She was last seen on 08/06/19 by another PA in the office.  At that time she was started on Trulicity.  She was instructed to follow up in one month for re-assessment of glucose on new medication.  It was noted that if the patient's insurance did not cover the Trulicity she would be instructed to increase her Glyburide.   The patient has not had her HgbA1C since October 2019.  At that time her level was 9.5. At the time of her last visit it was noted that she had completed a course of Augmentin for cellulitis.    Patient Active Problem List   Diagnosis Date Noted   Pelvic mass 07/08/2015   Hyperlipidemia, mixed 07/08/2015   Hx of iron deficiency 07/08/2015   Absolute anemia 04/27/2015   Diabetic peripheral neuropathy associated with type 2 diabetes mellitus ([place]) 04/27/2015   Breath shortness 04/27/2015   Accumulation of fluid in tissues 04/27/2015   Cardiac murmur 04/27/2015   Elective abortion 04/27/2015   HLD (hyperlipidemia) 04/27/2015   Decreased potassium in the blood 04/27/2015   Hypothyroidism 04/27/2015   Obstructive sleep apnea 04/27/2015   Disorder of peripheral nervous system 04/27/2015   Obstructive apnea 04/27/2015   Pain in shoulder 04/27/2015   Diabetes mellitus, type 2 ([place]) 04/27/2015   Fibroid uterus 10/25/2013   Aortic valve stenosis 05/01/2011   Edema 03/27/2011   SOB (shortness of breath) 03/27/2011   Diabetes mellitus ([place]) 03/27/2011   HTN (hypertension) 03/27/2011   Essential (primary) hypertension 07/05/2006   Anxiety, generalized 07/05/2006    Past Medical History:  Diagnosis Date   Anxiety    CHF (congestive heart failure) (HCC)    Diabetes mellitus    Edema    Heart murmur    Hypertension    Sleep apnea    Thyroid disease     Social  History   Socioeconomic History   Marital status: Married    Spouse name: Emily Tapia   Number of children: 2   Years of education: 15   Highest education level: Not on file  Occupational History   Occupation: [employer]  Scientist, product/process development strain: Not on file   Food insecurity    Worry: Not on file    Inability: Not on Lexicographer needs    Medical: Not on file    Non-medical: Not on file  Tobacco Use   Smoking status: Former Smoker    Packs/day: 1.50    Years: 25.00    Pack years: 37.50    Types: Cigarettes    Quit date: 11/24/2001    Years since quitting: 17.7   Smokeless tobacco: Never Used  Substance and Sexual Activity   Alcohol use: Yes    Alcohol/week: 0.0 standard drinks    Comment: OCCASIONALLY DRINKS WINE, ONCE A WEEK   Drug use: No   Sexual activity: Not on file  Lifestyle   Physical activity    Days per week: Not on file    Minutes per session: Not on file   Stress: Not on file  Relationships   Social connections    Talks on phone: Not on file    Gets together: Not on file    Attends religious service: Not on file    Active member of club or  organization: Not on file    Attends meetings of clubs or organizations: Not on file    Relationship status: Not on file   Intimate partner violence    Fear of current or ex partner: Not on file    Emotionally abused: Not on file    Physically abused: Not on file    Forced sexual activity: Not on file  Other Topics Concern   Not on file  Social History Narrative   Not on file    Outpatient Encounter Medications as of 08/14/2019  Medication Sig   aspirin 81 MG EC tablet Take 81 mg by mouth at bedtime.    Cholecalciferol (VITAMIN D3) 1000 UNITS CAPS Take 1 capsule by mouth every morning.    Dulaglutide (TRULICITY) A999333 0000000 SOPN Inject 0.75 mg into the skin once a week.   Ferrous Sulfate (IRON) 325 (65 Fe) MG TABS Take 1 tablet by mouth once daily with breakfast    furosemide (LASIX) 40 MG tablet TAKE 1 TABLET BY MOUTH ONCE DAILY AS NEEDED FOR FLUID OR EDEMA   Glucosamine 500 MG CAPS Take 500 mg by mouth 2 (two) times daily.   glucose blood (CONTOUR NEXT TEST) test strip Test fasting sugar each morning. Recheck if having hypoglycemic symptoms.   glyBURIDE (DIABETA) 5 MG tablet TAKE 1 TABLET BY MOUTH TWICE DAILY WITH A MEAL. SCHEDULE FOLLOW UP APPOINTMENT OR VIRTUAL VISIT AND LABS IN THE NEXT 30 DAYS.   levothyroxine (SYNTHROID, LEVOTHROID) 75 MCG tablet TAKE 1 TABLET BY MOUTH ONCE DAILY BEFORE BREAKFAST   lisinopril-hydrochlorothiazide (ZESTORETIC) 20-25 MG tablet Take 1 tablet by mouth once daily   metFORMIN (GLUCOPHAGE) 1000 MG tablet TAKE 1 TABLET BY MOUTH TWICE DAILY WITH A MEAL. PATIENT IS DUE FOR FOLLOW UP APPOINTMENT AND LABS.   metoprolol tartrate (LOPRESSOR) 25 MG tablet TAKE 1 TABLET BY MOUTH THREE TIMES DAILY. SCHEDULE FOLLOW UP APPOINTMENT IN THE NEXT ONE TO TWO MONTHS.   Multiple Vitamin (MULTIVITAMIN) tablet Take 1 tablet by mouth every morning.    PARoxetine (PAXIL) 20 MG tablet TAKE 1 TABLET BY MOUTH ONCE DAILY. SCHEDULE FOLLOW UP AND LABS IN THE NEXT 4 TO 6 WEEKS   fish oil-omega-3 fatty acids 1000 MG capsule Take 1 g by mouth 2 (two) times daily.    [DISCONTINUED] amoxicillin-clavulanate (AUGMENTIN) 875-125 MG tablet Take 1 tablet by mouth 2 (two) times daily for 10 days.   [DISCONTINUED] pravastatin (PRAVACHOL) 40 MG tablet TAKE ONE TABLET BY MOUTH ONCE DAILY (Patient not taking: Reported on 04/21/2018)   [DISCONTINUED] Semaglutide (OZEMPIC) 0.25 or 0.5 MG/DOSE SOPN Inject 0.25 mg into the skin every 7 (seven) days. (Patient not taking: Reported on 10/03/2018)   [DISCONTINUED] VICTOZA 18 MG/3ML SOPN INJECT SUBCUTANEOUSLY 1.2MG  DAILY (Patient not taking: Reported on 04/21/2018)   No facility-administered encounter medications on file as of 08/14/2019.     No Known Allergies  ROS  Objective:  BP 128/76    Pulse 64    Temp 98.8  F (37.1 C)    Resp 16    Wt 195 lb (88.5 kg)    SpO2 98%    BMI 33.47 kg/m   Physical Exam  Constitutional: She is oriented to person, place, and time and well-developed, well-nourished, and in no distress.  HENT:  Head: Normocephalic.  Eyes: Conjunctivae are normal.  Neck: Neck supple.  Cardiovascular: Normal rate and regular rhythm.  Murmur heard. Grade 3/6 systolic murmur with radiation to both carotids.  Pulmonary/Chest: Effort normal.  Abdominal: Soft. Bowel  sounds are normal.  Musculoskeletal: Normal range of motion.     Comments: Erythema and tenderness in the right 4th toe is clearing. No open wounds.  Neurological: She is alert and oriented to person, place, and time.  Skin: No rash noted.  Psychiatric: Mood, affect and judgment normal.   Diabetic Foot Form - Detailed   Diabetic Foot Exam - detailed Diabetic Foot exam was performed with the following findings: Yes 08/14/2019  9:10 AM  Visual Foot Exam completed.: Yes  Can the patient see the bottom of their feet?: Yes Are the shoes appropriate in style and fit?: Yes Is there swelling or and abnormal foot shape?: No Is there a claw toe deformity?: No Is there elevated skin temparature?: No Is there foot or ankle muscle weakness?: No Normal Range of Motion: Yes Pulse Foot Exam completed.: Yes  Right posterior Tibialias: Present Left posterior Tibialias: Present  Right Dorsalis Pedis: Present Left Dorsalis Pedis: Present  Sensory Foot Exam Completed.: Yes Semmes-Weinstein Monofilament Test R Site 1-Great Toe: Pos L Site 1-Great Toe: Pos    Comments: Recognizes nylon string in all areas but some pins & needles sensation in plantar surface of toes.     Assessment and Plan :  1. Type 2 diabetes mellitus with diabetic neuropathy, without long-term current use of insulin (HCC) States FBS ranging in the 260-309 levels recently with only Glyburide 5 mg BID and Metformin 1000 mg BID. Has had better control with Trulicity,  Ozempic (October 2019) or Victoza (May 2019) in the past but expense has been prohibitive with no coverage from insurance. Hgb A1C 9.5% in October 2019. Will get follow up labs today. Given 1 month supply of samples of Trulicity A999333 mg weekly and scheduled for CCM Pharmacist referral to try to get assistance with getting medications. - CBC with Differential/Platelet - Comprehensive metabolic panel - Hemoglobin A1c  2. Cellulitis of other specified site Treatment of cellulitis in the right 4th toe is improving with resolution of swelling and redness. No pain today or signs of an open wound. Finished the 10 day regimen of Augmentin today. Recheck as needed. If recurring, may need referral to podiatrist. Recheck CBC to assess for residual infection. - CBC with Differential/Platelet  3. Essential hypertension Tolerating the Zestoretic 20-25 mg qd and Metoprolol Tartrate 25 mg TID. Well controlled BP without edema. Recheck follow up labs. - CBC with Differential/Platelet - Comprehensive metabolic panel - Lipid panel - TSH - T4  4. Mixed hyperlipidemia Continues Fish Oil supplement and trying to control fats in diet. Agrees eating habits have been a little difficult during the pandemic restrictions. Follow CMP, Lipid Panel and TSH today. - Comprehensive metabolic panel - Lipid panel - TSH  5. Hypothyroidism, unspecified type Tolerating the Levothyroxine 75 mg qd without tremor, brittle nails, hair loss, constipation, diarrhea or palpitations. Recheck thyroid function tests. - CBC with Differential/Platelet - TSH - T4  6. Heart murmur Unchanged Grade 3/6 systolic murmur with radiation to both carotids today. States she rarely has any palpitations and no edema. Feels the Metoprolol 25 mg TID helps to maintain steady rhythm. Reminded her to schedule follow up with Dr. Yvone Neu (cardiologist) this year.

## 2019-08-15 LAB — CBC WITH DIFFERENTIAL/PLATELET
Basophils Absolute: 0 10*3/uL (ref 0.0–0.2)
Basos: 1 %
EOS (ABSOLUTE): 0.1 10*3/uL (ref 0.0–0.4)
Eos: 2 %
Hematocrit: 42.1 % (ref 34.0–46.6)
Hemoglobin: 13.1 g/dL (ref 11.1–15.9)
Immature Grans (Abs): 0 10*3/uL (ref 0.0–0.1)
Immature Granulocytes: 1 %
Lymphocytes Absolute: 1.8 10*3/uL (ref 0.7–3.1)
Lymphs: 30 %
MCH: 25.3 pg — ABNORMAL LOW (ref 26.6–33.0)
MCHC: 31.1 g/dL — ABNORMAL LOW (ref 31.5–35.7)
MCV: 81 fL (ref 79–97)
Monocytes Absolute: 0.3 10*3/uL (ref 0.1–0.9)
Monocytes: 5 %
Neutrophils Absolute: 3.7 10*3/uL (ref 1.4–7.0)
Neutrophils: 61 %
Platelets: 134 10*3/uL — ABNORMAL LOW (ref 150–450)
RBC: 5.18 x10E6/uL (ref 3.77–5.28)
RDW: 14.2 % (ref 11.7–15.4)
WBC: 5.9 10*3/uL (ref 3.4–10.8)

## 2019-08-15 LAB — COMPREHENSIVE METABOLIC PANEL
ALT: 37 IU/L — ABNORMAL HIGH (ref 0–32)
AST: 28 IU/L (ref 0–40)
Albumin/Globulin Ratio: 2 (ref 1.2–2.2)
Albumin: 4.4 g/dL (ref 3.8–4.9)
Alkaline Phosphatase: 77 IU/L (ref 39–117)
BUN/Creatinine Ratio: 21 (ref 9–23)
BUN: 14 mg/dL (ref 6–24)
Bilirubin Total: 0.4 mg/dL (ref 0.0–1.2)
CO2: 26 mmol/L (ref 20–29)
Calcium: 9.2 mg/dL (ref 8.7–10.2)
Chloride: 96 mmol/L (ref 96–106)
Creatinine, Ser: 0.67 mg/dL (ref 0.57–1.00)
GFR calc Af Amer: 111 mL/min/{1.73_m2} (ref >59)
GFR calc non Af Amer: 97 mL/min/{1.73_m2} (ref >59)
Globulin, Total: 2.2 g/dL (ref 1.5–4.5)
Glucose: 342 mg/dL — ABNORMAL HIGH (ref 65–99)
Potassium: 4 mmol/L (ref 3.5–5.2)
Sodium: 138 mmol/L (ref 134–144)
Total Protein: 6.6 g/dL (ref 6.0–8.5)

## 2019-08-15 LAB — LIPID PANEL
Chol/HDL Ratio: 5.2 ratio — ABNORMAL HIGH (ref 0.0–4.4)
Cholesterol, Total: 151 mg/dL (ref 100–199)
HDL: 29 mg/dL — ABNORMAL LOW (ref >39)
LDL Calculated: 85 mg/dL (ref 0–99)
Triglycerides: 187 mg/dL — ABNORMAL HIGH (ref 0–149)
VLDL Cholesterol Cal: 37 mg/dL (ref 5–40)

## 2019-08-15 LAB — HEMOGLOBIN A1C
Est. average glucose Bld gHb Est-mCnc: 298 mg/dL
Hgb A1c MFr Bld: 12 % — ABNORMAL HIGH (ref 4.8–5.6)

## 2019-08-15 LAB — T4: T4, Total: 12.7 ug/dL — ABNORMAL HIGH (ref 4.5–12.0)

## 2019-08-15 LAB — TSH: TSH: 2.79 u[IU]/mL (ref 0.450–4.500)

## 2019-08-17 ENCOUNTER — Telehealth: Payer: Self-pay

## 2019-08-17 NOTE — Telephone Encounter (Signed)
Patient advised as below.  

## 2019-08-17 NOTE — Telephone Encounter (Signed)
-----   Message from Margo Common, [place] sent at 08/16/2019 11:07 PM EDT ----- Blood sugar and Hgb A1C very high. HDL cholesterol is low, also. Must continue Metformin 1000 mg BID with Glyburide 5 mg BID and start the Trulicity once a week. Should check FBS daily and proceed with CCM pharmacist appointment to see if assistance in getting the Trulicity can be found. Recheck levels in 3 months. Call report of FBS readings in a month. Must follow diabetic diet and exercise 30 minutes 4 days a week.

## 2019-08-24 ENCOUNTER — Other Ambulatory Visit: Payer: Self-pay | Admitting: Family Medicine

## 2019-08-27 ENCOUNTER — Telehealth: Payer: PRIVATE HEALTH INSURANCE

## 2019-08-27 ENCOUNTER — Ambulatory Visit: Payer: Self-pay | Admitting: Pharmacist

## 2019-08-27 DIAGNOSIS — E1140 Type 2 diabetes mellitus with diabetic neuropathy, unspecified: Secondary | ICD-10-CM

## 2019-08-27 NOTE — Patient Instructions (Signed)
Emily Tapia was given information about Care Management services today including:  1. Care Management services includes personalized support from designated clinical staff supervised by her physician, including individualized plan of care and coordination with other care providers 2. 24/7 contact phone numbers for assistance for urgent and routine care needs. 3. The patient may stop case management services at any time by phone call to the office staff.  Patient agreed to services and verbal consent obtained.

## 2019-08-27 NOTE — Chronic Care Management (AMB) (Signed)
  Care Management   Note  08/27/2019 Name: Markeshia Manbeck MRN: [id number] DOB: 08-29-1961  Shu Lacock is a 58 y.o. year old female who sees Chrismon, Vickki Muff, [place] for primary care. Simona Huh Chrismon asked the CCM pharmacist to consult the patient for medication assistance and DM education. Referral was placed 08/17/19. Telephone outreach to patient today to introduce care management services.   Plan:Ms Milich agreed that care management services would be helpful to him/her and verbal consent was obtained. I have scheduled an initial office visit for 09/03/19   Ms. Fadley was given information about Care Management services today including:  1. Case Management services includes personalized support from designated clinical staff supervised by her physician, including individualized plan of care and coordination with other care providers 2. 24/7 contact phone numbers for assistance for urgent and routine care needs. 3. The patient may stop case management services at any time by phone call to the office staff.  Patient agreed to services and verbal consent obtained.    Ruben Reason, PharmD Clinical Pharmacist [place] [phone]

## 2019-09-03 ENCOUNTER — Ambulatory Visit: Payer: Self-pay | Admitting: Pharmacist

## 2019-09-03 ENCOUNTER — Telehealth: Payer: PRIVATE HEALTH INSURANCE

## 2019-09-03 DIAGNOSIS — E1140 Type 2 diabetes mellitus with diabetic neuropathy, unspecified: Secondary | ICD-10-CM

## 2019-09-04 NOTE — Chronic Care Management (AMB) (Signed)
  Care Management   Note  09/04/2019 Name: Emily Tapia MRN: [id number] DOB: 10-21-1961  Initial pharmacy telephone outreach with [name] today. Referred to clinic pharmacist for medication assistance- Trulicity. HIPAA identifiers verified.  Goals Addressed            This Visit's Progress   . Unable to afford Trulicity (pt-stated)       Current Barriers:  . Financial . Insurance coverage  Pharmacist Clinical Goal(s): Over the next 14 days, [name] will  work with CCM pharmacist to develop a plan to obtain her Trulicity pens.   Interventions: . CCM pharmacist will provide Truicity coupon . CCM pharmacist to contact community pharmacy to investigate high deductible insurance plan or any tier alternatives (Ozempic, Victoza)  Patient Self Care Activities:  . Activate Trulicity coupon  Initial goal documentation         Follow up plan: Telephone follow up appointment with care management team member scheduled for: September 28 at 12:30, pharmacist to contact community pharmacy  Ruben Reason, PharmD Clinical Pharmacist [place] [phone]

## 2019-09-05 ENCOUNTER — Emergency Department
Admission: EM | Admit: 2019-09-05 | Discharge: 2019-09-05 | Disposition: A | Discharge status: Home / Self Care | Payer: PRIVATE HEALTH INSURANCE | Attending: Emergency Medicine | Admitting: Emergency Medicine

## 2019-09-05 ENCOUNTER — Encounter: Payer: Self-pay | Admitting: Emergency Medicine

## 2019-09-05 ENCOUNTER — Other Ambulatory Visit: Payer: Self-pay

## 2019-09-05 DIAGNOSIS — E119 Type 2 diabetes mellitus without complications: Secondary | ICD-10-CM | POA: Diagnosis not present

## 2019-09-05 DIAGNOSIS — I110 Hypertensive heart disease with heart failure: Secondary | ICD-10-CM | POA: Diagnosis not present

## 2019-09-05 DIAGNOSIS — L03032 Cellulitis of left toe: Secondary | ICD-10-CM | POA: Insufficient documentation

## 2019-09-05 DIAGNOSIS — E039 Hypothyroidism, unspecified: Secondary | ICD-10-CM | POA: Insufficient documentation

## 2019-09-05 DIAGNOSIS — Z79899 Other long term (current) drug therapy: Secondary | ICD-10-CM | POA: Diagnosis not present

## 2019-09-05 DIAGNOSIS — Z7982 Long term (current) use of aspirin: Secondary | ICD-10-CM | POA: Insufficient documentation

## 2019-09-05 DIAGNOSIS — M79676 Pain in unspecified toe(s): Secondary | ICD-10-CM | POA: Diagnosis present

## 2019-09-05 DIAGNOSIS — L600 Ingrowing nail: Secondary | ICD-10-CM

## 2019-09-05 DIAGNOSIS — L03031 Cellulitis of right toe: Secondary | ICD-10-CM

## 2019-09-05 DIAGNOSIS — Z7984 Long term (current) use of oral hypoglycemic drugs: Secondary | ICD-10-CM | POA: Insufficient documentation

## 2019-09-05 DIAGNOSIS — Z87891 Personal history of nicotine dependence: Secondary | ICD-10-CM | POA: Diagnosis not present

## 2019-09-05 DIAGNOSIS — I509 Heart failure, unspecified: Secondary | ICD-10-CM | POA: Diagnosis not present

## 2019-09-05 MED ORDER — SULFAMETHOXAZOLE-TRIMETHOPRIM 800-160 MG PO TABS: 1.0000 | ORAL_TABLET | Freq: Two times a day (BID) | ORAL | 0 refills | Status: DC

## 2019-09-05 NOTE — Discharge Instructions (Signed)
Take medication as directed.  Advised Epson salt soaks twice a day for 2 days.  Keep areas clean and dry.

## 2019-09-05 NOTE — ED Triage Notes (Signed)
Pt to ED via POV c/o right toe pain. Pt states that she was treated for infection in the same toe a few weeks ago. Pt states that over the past few days that she has noticed that she was having pain in her toe, last night she noticed a small area of discoloration. Pt has small black area on her right 4 th toe. Pt is in NAD at this time. Pt is diabetic.

## 2019-09-05 NOTE — ED Provider Notes (Signed)
[facility] Emergency Department Provider Note   ____________________________________________   First MD Initiated Contact with Patient 09/05/19 1051     (approximate)  I have reviewed the triage vital signs and the nursing notes.   HISTORY  Chief Complaint Toe Pain    HPI Emily Tapia is a 58 y.o. female patient on her right toe pain that started 2 days ago.  Patient notes a dark spot at the distal aspect of the fourth digit right foot.  Patient also having bleeding to the fifth digit of the left foot.  Patient states she had a toenail infection that was treated with unknown antibiotics a few weeks ago.  Patient denies loss of sensation but she is a diabetic.  Rates the pain as a 3/10.  No palliative measure for complaint.         Past Medical History:  Diagnosis Date  . Anxiety   . CHF (congestive heart failure) ([place])   . Diabetes mellitus   . Edema   . Heart murmur   . Hypertension   . Sleep apnea   . Thyroid disease     Patient Active Problem List   Diagnosis Date Noted  . Pelvic mass 07/08/2015  . Hyperlipidemia, mixed 07/08/2015  . Hx of iron deficiency 07/08/2015  . Absolute anemia 04/27/2015  . Diabetic peripheral neuropathy associated with type 2 diabetes mellitus ([place]) 04/27/2015  . Breath shortness 04/27/2015  . Accumulation of fluid in tissues 04/27/2015  . Cardiac murmur 04/27/2015  . Elective abortion 04/27/2015  . HLD (hyperlipidemia) 04/27/2015  . Decreased potassium in the blood 04/27/2015  . Hypothyroidism 04/27/2015  . Obstructive sleep apnea 04/27/2015  . Disorder of peripheral nervous system 04/27/2015  . Obstructive apnea 04/27/2015  . Pain in shoulder 04/27/2015  . Diabetes mellitus, type 2 ([place]) 04/27/2015  . Fibroid uterus 10/25/2013  . Aortic valve stenosis 05/01/2011  . Edema 03/27/2011  . SOB (shortness of breath) 03/27/2011  . Diabetes mellitus ([place]) 03/27/2011  . HTN (hypertension) 03/27/2011   . Essential (primary) hypertension 07/05/2006  . Anxiety, generalized 07/05/2006    Past Surgical History:  Procedure Laterality Date  . ABDOMINAL HYSTERECTOMY  11/17/2013  . BREAST BIOPSY     x2  . CESAREAN SECTION     x 2  . CHOLECYSTECTOMY  2003   DUE TO STONES  . DILATION AND CURETTAGE OF UTERUS    . ELBOW SURGERY    . POLYPECTOMY  1995    Prior to Admission medications   Medication Sig Start Date End Date Taking? Authorizing Provider  aspirin 81 MG EC tablet Take 81 mg by mouth at bedtime.     [provider]  Cholecalciferol (VITAMIN D3) 1000 UNITS CAPS Take 1 capsule by mouth every morning.     [provider]  Dulaglutide (TRULICITY) A999333 0000000 SOPN Inject 0.75 mg into the skin once a week. 08/06/19   Trinna Post, PA-C  Ferrous Sulfate (IRON) 325 (65 Fe) MG TABS Take 1 tablet by mouth once daily with breakfast 04/24/19   Chrismon, Vickki Muff, PA  fish oil-omega-3 fatty acids 1000 MG capsule Take 1 g by mouth 2 (two) times daily.     [provider]  furosemide (LASIX) 40 MG tablet TAKE 1 TABLET BY MOUTH ONCE DAILY AS NEEDED FOR FLUID OR EDEMA 05/02/18   Chrismon, Vickki Muff, PA  Glucosamine 500 MG CAPS Take 500 mg by mouth 2 (two) times daily.    [provider]  glucose blood (CONTOUR NEXT TEST) test strip Test fasting sugar each morning. Recheck if having hypoglycemic symptoms. 10/03/18   Chrismon, Simona Huh E, PA  glyBURIDE (DIABETA) 5 MG tablet TAKE 1 TABLET BY MOUTH TWICE DAILY WITH A MEAL. SCHEDULE FOLLOW UP APPOINTMENT OR VIRTUAL VISIT AND LABS IN THE NEXT 30 DAYS. 07/27/19   Chrismon, Vickki Muff, PA  levothyroxine (SYNTHROID) 75 MCG tablet TAKE 1 TABLET BY MOUTH ONCE DAILY BEFORE BREAKFAST 08/24/19   Chrismon, Vickki Muff, PA  lisinopril-hydrochlorothiazide (ZESTORETIC) 20-25 MG tablet Take 1 tablet by mouth once daily 07/14/19   Chrismon, Vickki Muff, PA  metFORMIN (GLUCOPHAGE) 1000 MG tablet Take 1 tablet (1,000 mg total) by mouth 2 (two) times  daily with a meal. 08/24/19   Chrismon, Vickki Muff, PA  metoprolol tartrate (LOPRESSOR) 25 MG tablet TAKE 1 TABLET BY MOUTH THREE TIMES DAILY. SCHEDULE FOLLOW UP APPOINTMENT IN THE NEXT ONE TO TWO MONTHS. 04/30/19   Chrismon, Vickki Muff, PA  Multiple Vitamin (MULTIVITAMIN) tablet Take 1 tablet by mouth every morning.     [provider]  PARoxetine (PAXIL) 20 MG tablet TAKE 1 TABLET BY MOUTH ONCE DAILY. SCHEDULE FOLLOW UP AND LABS IN THE NEXT 4 TO 6 WEEKS 06/29/19   Chrismon, Vickki Muff, PA  sulfamethoxazole-trimethoprim (BACTRIM DS) 800-160 MG tablet Take 1 tablet by mouth 2 (two) times daily. 09/05/19   Sable Feil, PA-C    Allergies Patient has no known allergies.  Family History  Problem Relation Age of Onset  . Hyperlipidemia Mother   . Thyroid disease Mother   . Hashimoto's thyroiditis Mother   . Heart disease Father        open heart surgery  . Heart failure Father   . Diabetes Father   . Dementia Father   . Thyroid disease Brother   . Breast cancer Maternal Grandmother   . Diabetes Paternal Grandmother   . Prostate cancer Paternal Grandfather     Social History Social History   Tobacco Use  . Smoking status: Former Smoker    Packs/day: 1.50    Years: 25.00    Pack years: 37.50    Types: Cigarettes    Quit date: 11/24/2001    Years since quitting: 17.7  . Smokeless tobacco: Never Used  Substance Use Topics  . Alcohol use: Yes    Alcohol/week: 0.0 standard drinks    Comment: OCCASIONALLY DRINKS WINE, ONCE A WEEK  . Drug use: No    Review of Systems  Constitutional: No fever/chills Eyes: No visual changes. ENT: No sore throat. Cardiovascular: Denies chest pain. Respiratory: Denies shortness of breath. Gastrointestinal: No abdominal pain.  No nausea, no vomiting.  No diarrhea.  No constipation. Genitourinary: Negative for dysuria. Musculoskeletal: Negative for back pain. Skin: Negative for rash. Neurological: Negative for headaches, focal weakness or  numbness. Psychiatric:  Anxiety Endocrine:  Diabetes, hypothyroidism, and hypertension.  ____________________________________________   PHYSICAL EXAM:  VITAL SIGNS: ED Triage Vitals  Enc Vitals Group     BP 09/05/19 0957 (!) 172/101     Pulse Rate 09/05/19 0957 79     Resp --      Temp 09/05/19 0957 98.5 F (36.9 C)     Temp Source 09/05/19 0957 Oral     SpO2 09/05/19 0957 96 %     Weight --      Height --      Head Circumference --      Peak Flow --      Pain Score  09/05/19 1007 3     Pain Loc --      Pain Edu? --      Excl. in [place]? --    Constitutional: Alert and oriented. Well appearing and in no acute distress. Cardiovascular: Normal rate, regular rhythm. Grossly normal heart sounds.  Good peripheral circulation.  Elevated blood pressure. Respiratory: Normal respiratory effort.  No retractions. Lungs CTAB. Neurologic:  Normal speech and language. No gross focal neurologic deficits are appreciated. No gait instability. Skin: Fifth digit left toe has purulent drainage from the lateral nail bed.  Fourth digit right foot shows mild erythema and edema. Psychiatric: Mood and affect are normal. Speech and behavior are normal.  ____________________________________________   LABS (all labs ordered are listed, but only abnormal results are displayed)  Labs Reviewed - No data to display ____________________________________________  EKG   ____________________________________________  RADIOLOGY  ED MD interpretation:    Official radiology report(s): No results found.  ____________________________________________   PROCEDURES  Procedure(s) performed (including Critical Care):  Procedures   ____________________________________________   INITIAL IMPRESSION / ASSESSMENT AND PLAN / ED COURSE  As part of my medical decision making, I reviewed the following data within the electronic MEDICAL RECORD NUMBER         Dayami Blomberg was evaluated in Emergency  Department on 09/05/2019 for the symptoms described in the history of present illness. She was evaluated in the context of the global COVID-19 pandemic, which necessitated consideration that the patient might be at risk for infection with the SARS-CoV-2 virus that causes COVID-19. Institutional protocols and algorithms that pertain to the evaluation of patients at risk for COVID-19 are in a state of rapid change based on information released by regulatory bodies including the CDC and federal and state organizations. These policies and algorithms were followed during the patient's care in the ED.  Patient presents with pain and edema to the fourth digit right foot and fifth digit left foot.  Physical exam is consistent with infected toenails.  Patient given discharge care instructions advised take antibiotics as directed.  Patient advised to follow-up with PCP in 1 week.  Return to ED if addition worsens.      ____________________________________________   FINAL CLINICAL IMPRESSION(S) / ED DIAGNOSES  Final diagnoses:  Subungual infection of toe of both feet due to ingrown toenail     ED Discharge Orders         Ordered    sulfamethoxazole-trimethoprim (BACTRIM DS) 800-160 MG tablet  2 times daily     09/05/19 1109           Note:  This document was prepared using Dragon voice recognition software and may include unintentional dictation errors.    Sable Feil, PA-C 09/05/19 1120    Carrie Mew, MD 09/06/19 [phone]

## 2019-09-05 NOTE — ED Notes (Signed)
Pt comes to the ED c/o toe pain on the 4th toe of rt foot. Bruising is noted at the tip of the affected toe.

## 2019-09-11 ENCOUNTER — Other Ambulatory Visit: Payer: Self-pay

## 2019-09-11 DIAGNOSIS — E1140 Type 2 diabetes mellitus with diabetic neuropathy, unspecified: Secondary | ICD-10-CM

## 2019-09-14 ENCOUNTER — Other Ambulatory Visit: Payer: Self-pay | Admitting: Family Medicine

## 2019-09-14 ENCOUNTER — Telehealth: Payer: PRIVATE HEALTH INSURANCE

## 2019-09-17 ENCOUNTER — Encounter: Payer: PRIVATE HEALTH INSURANCE | Admitting: Pharmacist

## 2019-09-27 ENCOUNTER — Other Ambulatory Visit: Payer: Self-pay | Admitting: Family Medicine

## 2019-09-27 DIAGNOSIS — F411 Generalized anxiety disorder: Secondary | ICD-10-CM

## 2019-09-27 DIAGNOSIS — I10 Essential (primary) hypertension: Secondary | ICD-10-CM

## 2019-10-19 ENCOUNTER — Encounter: Payer: Self-pay | Admitting: Cardiology

## 2019-10-19 ENCOUNTER — Ambulatory Visit (INDEPENDENT_AMBULATORY_CARE_PROVIDER_SITE_OTHER): Payer: PRIVATE HEALTH INSURANCE | Admitting: Cardiology

## 2019-10-19 ENCOUNTER — Other Ambulatory Visit: Payer: Self-pay

## 2019-10-19 VITALS — BP 154/70 | HR 72 | Temp 97.5°F | Ht 64.0 in | Wt 195.8 lb

## 2019-10-19 DIAGNOSIS — R0600 Dyspnea, unspecified: Secondary | ICD-10-CM | POA: Diagnosis not present

## 2019-10-19 DIAGNOSIS — I350 Nonrheumatic aortic (valve) stenosis: Secondary | ICD-10-CM

## 2019-10-19 DIAGNOSIS — I10 Essential (primary) hypertension: Secondary | ICD-10-CM | POA: Diagnosis not present

## 2019-10-19 DIAGNOSIS — R0609 Other forms of dyspnea: Secondary | ICD-10-CM

## 2019-10-19 NOTE — Patient Instructions (Signed)
Medication Instructions:  Your physician recommends that you continue on your current medications as directed. Please refer to the Current Medication list given to you today.  *If you need a refill on your cardiac medications before your next appointment, please call your pharmacy*  Lab Work: None ordered If you have labs (blood work) drawn today and your tests are completely normal, you will receive your results only by: Marland Kitchen MyChart Message (if you have MyChart) OR . A paper copy in the mail If you have any lab test that is abnormal or we need to change your treatment, we will call you to review the results.  Testing/Procedures: Your physician has requested that you have a stress echocardiogram. For further information please visit [email or web address]. Please follow instruction sheet as given.    Follow-Up: At [facility], you and your health needs are our priority.  As part of our continuing mission to provide you with exceptional heart care, we have created designated Provider Care Teams.  These Care Teams include your primary Cardiologist (physician) and Advanced Practice Providers (APPs -  Physician Assistants and Nurse Practitioners) who all work together to provide you with the care you need, when you need it.  Your next appointment:   2-3 months  The format for your next appointment:   In Person  Provider:    You may see [name], MD or one of the following Advanced Practice Providers on your designated Care Team:    [name], NP  [name], PA-C  [name], PA-C   Other Instructions  Exercise Stress Echocardiogram  An exercise stress echocardiogram is a test to check how well your heart is working. This test uses sound waves (ultrasound) and a computer to make images of your heart before and after exercise. Ultrasound images that are taken before you exercise (your resting echocardiogram) will show how much blood is getting to your heart  muscle and how well your heart muscle and heart valves are functioning. During the next part of this test, you will walk on a treadmill or ride a stationary bike to see how exercise affects your heart. While you exercise, the electrical activity of your heart will be monitored with an electrocardiogram (ECG). Your blood pressure will also be monitored. You may have this test if you:  Have chest pain or other symptoms of a heart problem.  Recently had a heart attack or heart surgery.  Have heart valve problems.  Have a condition that causes narrowing of the blood vessels that supply your heart (coronary artery disease).  Have a high risk of heart disease and are starting a new exercise program.  Have a high risk of heart disease and need to have major surgery. Tell a health care provider about:  Any allergies you have.  All medicines you are taking, including vitamins, herbs, eye drops, creams, and over-the-counter medicines.  Any problems you or family members have had with anesthetic medicines.  Any blood disorders you have.  Any surgeries you have had.  Any medical conditions you have.  Whether you are pregnant or may be pregnant. What are the risks? Generally, this is a safe procedure. However, problems may occur, including:  Chest pain.  Dizziness or light-headedness.  Shortness of breath.  Increased or irregular heartbeat (palpitations).  Nausea or vomiting.  Heart attack (very rare). What happens before the procedure?  Follow instructions from your health care provider about eating or drinking restrictions. You may be asked to avoid all forms of  caffeine for 24 hours before your procedure, or as told by your health care provider.  Ask your health care provider about changing or stopping your regular medicines. This is especially important if you are taking diabetes medicines or blood thinners.  If you use an inhaler, bring it with you to the test.  Wear  loose, comfortable clothing and walking shoes.  Do notuse any products that contain nicotine or tobacco, such as cigarettes and e-cigarettes, for 4 hours before the test or as told by your health care provider. If you need help quitting, ask your health care provider. What happens during the procedure?  You will take off your clothes from the waist up and put on a hospital gown.  A technician will place electrodes on your chest.  A blood pressure cuff will be placed on your arm.  You will lie down on a table for an ultrasound exam before you exercise. Gel will be rubbed on your chest, and a handheld device (transducer) will be pressed against your chest and moved over your heart.  Then, you will start exercising by walking on a treadmill or pedaling a stationary bicycle.  Your blood pressure and heart rhythm will be monitored while you exercise.  The exercise will gradually get harder or faster.  You will exercise until: ? Your heart reaches a target level. ? You are too tired to continue. ? You cannot continue because of chest pain, weakness, or dizziness.  You will have another ultrasound exam after you stop exercising. The procedure may vary among health care providers and hospitals. What happens after the procedure?  Your heart rate and blood pressure will be monitored until they return to your normal levels. Summary  An exercise stress echocardiogram is a test that uses ultrasound to check how well your heart works before and after exercise.  Before the test, follow instructions from your health care provider about stopping medications, avoiding nicotine and tobacco, and avoiding certain foods and drinks.  During the test, your blood pressure and heart rhythm will be monitored while you exercise on a treadmill or stationary bicycle. This information is not intended to replace advice given to you by your health care provider. Make sure you discuss any questions you have with  your health care provider. Document Released: 12/07/2004 Document Revised: 08/17/2016 Document Reviewed: 07/25/2016 Elsevier Patient Education  2020 [employer].

## 2019-10-19 NOTE — Progress Notes (Signed)
Cardiology Office Note:    Date:  10/19/2019   ID:  Emily Tapia, DOB 11-18-1961, MRN [id number]  PCP:  Margo Common, PA  Cardiologist:  [name], MD  Electrophysiologist:  None   Referring MD: Margo Common, PA   Chief Complaint  Patient presents with  . other    Former Dr. Yvone Neu patient; last seen in 12/2016. Meds reviewed by the pt. verbally. Pt. c/o LE edema.     History of Present Illness:    Emily Tapia is a 58 y.o. female with a hx of hypertension, diabetes, moderate aortic stenosis who presents for follow-up.  Patient was last seen about 2 years ago by Dr. Yvone Neu.  At the time she had complaints of shortness of breath.  Her echocardiogram showed normal ejection fraction and normal diastolic function,Moderate aortic stenosis.  She also has shortness of breath with exertion.  She states doing stuff around the house causes her to be more short of breath.  She attributes this to deconditioning over the past 7 months due to the COVID-19 pandemic.  Of note she has had shortness of breath in the past, Lexiscan stress test was recommended 3 years ago but patient has a history of claustrophobia and could not perform test.  At the time she had edema, with Norvasc deemed the likely cause.  This was stopped with some improvement of symptoms.  Patient checks her blood pressure frequently at home and is usually [id number] systolic.  Past Medical History:  Diagnosis Date  . Anxiety   . Aortic stenosis   . Diabetes mellitus   . Edema   . Heart murmur   . Hypertension   . Sleep apnea   . Thyroid disease     Past Surgical History:  Procedure Laterality Date  . ABDOMINAL HYSTERECTOMY  11/17/2013  . BREAST BIOPSY     x2  . CESAREAN SECTION     x 2  . CHOLECYSTECTOMY  2003   DUE TO STONES  . DILATION AND CURETTAGE OF UTERUS    . ELBOW SURGERY    . POLYPECTOMY  1995    Current Medications: Current Meds  Medication Sig  . aspirin 81 MG EC tablet Take 81  mg by mouth at bedtime.   . Cholecalciferol (VITAMIN D3) 1000 UNITS CAPS Take 1 capsule by mouth every morning.   . Ferrous Sulfate (IRON) 325 (65 Fe) MG TABS Take 1 tablet by mouth once daily with breakfast  . furosemide (LASIX) 40 MG tablet TAKE 1 TABLET BY MOUTH ONCE DAILY AS NEEDED FOR FLUID OR EDEMA  . Glucosamine 500 MG CAPS Take 500 mg by mouth 2 (two) times daily.  Marland Kitchen glucose blood (CONTOUR NEXT TEST) test strip Test fasting sugar each morning. Recheck if having hypoglycemic symptoms.  Marland Kitchen glyBURIDE (DIABETA) 5 MG tablet TAKE 1 TABLET BY MOUTH TWICE DAILY WITH A MEAL. SCHEDULE FOLLOW UP APPOINTMENT OR VIRTUAL VISIT AND LABS IN THE NEXT 30 DAYS.  Marland Kitchen levothyroxine (SYNTHROID) 75 MCG tablet TAKE 1 TABLET BY MOUTH ONCE DAILY BEFORE BREAKFAST  . lisinopril-hydrochlorothiazide (ZESTORETIC) 20-25 MG tablet Take 1 tablet by mouth once daily  . metFORMIN (GLUCOPHAGE) 1000 MG tablet Take 1 tablet (1,000 mg total) by mouth 2 (two) times daily with a meal.  . metoprolol tartrate (LOPRESSOR) 25 MG tablet Take 1 tablet (25 mg total) by mouth 3 (three) times daily.  . Multiple Vitamin (MULTIVITAMIN) tablet Take 1 tablet by mouth every morning.   Marland Kitchen PARoxetine (PAXIL) 20  MG tablet TAKE 1 TABLET BY MOUTH ONCE DAILY. SCHEDULE FOLLOW UP APPOINTMENT AND FASTING LABS IN THE NEXT 1 TO 2 MONTHS.     Allergies:   Patient has no known allergies.   Social History   Socioeconomic History  . Marital status: Married    Spouse name: [name]  . Number of children: 2  . Years of education: 40  . Highest education level: Not on file  Occupational History  . Occupation: [employer]  Social Needs  . Financial resource strain: Not on file  . Food insecurity    Worry: Not on file    Inability: Not on file  . Transportation needs    Medical: Not on file    Non-medical: Not on file  Tobacco Use  . Smoking status: Former Smoker    Packs/day: 1.50    Years: 25.00    Pack years: 37.50    Types: Cigarettes    Quit date:  11/24/2001    Years since quitting: 17.9  . Smokeless tobacco: Never Used  Substance and Sexual Activity  . Alcohol use: Yes    Alcohol/week: 0.0 standard drinks    Comment: OCCASIONALLY DRINKS WINE, ONCE A WEEK  . Drug use: No  . Sexual activity: Not on file  Lifestyle  . Physical activity    Days per week: Not on file    Minutes per session: Not on file  . Stress: Not on file  Relationships  . Social Herbalist on phone: Not on file    Gets together: Not on file    Attends religious service: Not on file    Active member of club or organization: Not on file    Attends meetings of clubs or organizations: Not on file    Relationship status: Not on file  Other Topics Concern  . Not on file  Social History Narrative  . Not on file     Family History: The patient's family history includes Breast cancer in her maternal grandmother; Dementia in her father; Diabetes in her father and paternal grandmother; Hashimoto's thyroiditis in her mother; Heart disease in her father; Heart failure in her father; Hyperlipidemia in her mother; Prostate cancer in her paternal grandfather; Thyroid disease in her brother and mother.  ROS:   Please see the history of present illness.     All other systems reviewed and are negative.  EKGs/Labs/Other Studies Reviewed:    The following studies were reviewed today: Echocardiogram date 11/02/2016  Left ventricle: The cavity size was normal. There was mild   concentric hypertrophy. Systolic function was normal. The   estimated ejection fraction was in the range of 60% to 65%. Wall   motion was normal; there were no regional wall motion   abnormalities. Left ventricular diastolic function parameters   were normal. - Aortic valve: Transvalvular velocity was increased. There was   moderate stenosis. Peak velocity (S): 380 cm/s. Mean gradient   (S): 30 mm Hg. Peak gradient (S): 60 mm Hg. Valve area (VTI):   1.13 cm^2. - Aortic root: The  aortic root was normal in size. - Right ventricle: Systolic function was normal. - Pulmonary arteries: Systolic pressure was within the normal   range.  EKG:  EKG is  ordered today.  The ekg ordered today demonstrates normal sinus rhythm, normal ECG.  Recent Labs: 08/14/2019: ALT 37; BUN 14; Creatinine, Ser 0.67; Hemoglobin 13.1; Platelets 134; Potassium 4.0; Sodium 138; TSH 2.790  Recent Lipid Panel  Component Value Date/Time   CHOL 151 08/14/2019 0920   TRIG 187 (H) 08/14/2019 0920   HDL 29 (L) 08/14/2019 0920   CHOLHDL 5.2 (H) 08/14/2019 0920   LDLCALC 85 08/14/2019 0920    Physical Exam:    VS:  BP (!) 154/70 (BP Location: Right Arm, Patient Position: Sitting, Cuff Size: Normal)   Pulse 72   Temp (!) 97.5 F (36.4 C)   Ht 5\' 4"  (1.626 m)   Wt 195 lb 12 oz (88.8 kg)   BMI 33.60 kg/m     Wt Readings from Last 3 Encounters:  10/19/19 195 lb 12 oz (88.8 kg)  08/14/19 195 lb (88.5 kg)  08/04/19 197 lb (89.4 kg)     GEN:  Well nourished, well developed in no acute distress HEENT: Normal NECK: No JVD; No carotid bruits LYMPHATICS: No lymphadenopathy CARDIAC: RRR, 3/6 systolic murmur right upper sternal border, no rubs, no gallops RESPIRATORY:  Clear to auscultation without rales, wheezing or rhonchi  ABDOMEN: Soft, non-tender, non-distended MUSCULOSKELETAL:  No edema; No deformity  SKIN: Warm and dry NEUROLOGIC:  Alert and oriented x 3 PSYCHIATRIC:  Normal affect   ASSESSMENT:   Echocardiogram from about 3 years ago shows moderate aortic stenosis.  Patient with dyspnea on exertion.  Her blood pressure is elevated today.  She attributes it is due to stress from stepping on a scale. 1. Aortic valve stenosis, etiology of cardiac valve disease unspecified   2. DOE (dyspnea on exertion)   3. Essential hypertension    PLAN:    In order of problems listed above:  1. Get echocardiogram to evaluate aortic valve disease and progression. 2. Get stress echocardiogram.   Patient is claustrophobic and could not tolerate in the past 3. Monitor blood pressure closely.  If elevated on next visit we will plan to address BP meds.  Total encounter time more than 40 minutes  Greater than 50% was spent in counseling and coordination of care with the patient   This note was generated in part or whole with voice recognition software. Voice recognition is usually quite accurate but there are transcription errors that can and very often do occur. I apologize for any typographical errors that were not detected and corrected.  Medication Adjustments/Labs and Tests Ordered: Current medicines are reviewed at length with the patient today.  Concerns regarding medicines are outlined above.  Orders Placed This Encounter  Procedures  . EKG 12-Lead  . ECHOCARDIOGRAM STRESS TEST   No orders of the defined types were placed in this encounter.   Patient Instructions  Medication Instructions:  Your physician recommends that you continue on your current medications as directed. Please refer to the Current Medication list given to you today.  *If you need a refill on your cardiac medications before your next appointment, please call your pharmacy*  Lab Work: None ordered If you have labs (blood work) drawn today and your tests are completely normal, you will receive your results only by: Marland Kitchen MyChart Message (if you have MyChart) OR . A paper copy in the mail If you have any lab test that is abnormal or we need to change your treatment, we will call you to review the results.  Testing/Procedures: Your physician has requested that you have a stress echocardiogram. For further information please visit [email or web address]. Please follow instruction sheet as given.    Follow-Up: At [facility], you and your health needs are our priority.  As part of our continuing mission to provide you  with exceptional heart care, we have created designated Provider Care Teams.  These Care  Teams include your primary Cardiologist (physician) and Advanced Practice Providers (APPs -  Physician Assistants and Nurse Practitioners) who all work together to provide you with the care you need, when you need it.  Your next appointment:   2-3 months  The format for your next appointment:   In Person  Provider:    You may see [name], MD or one of the following Advanced Practice Providers on your designated Care Team:    [name], NP  [name], PA-C  [name], PA-C   Other Instructions  Exercise Stress Echocardiogram  An exercise stress echocardiogram is a test to check how well your heart is working. This test uses sound waves (ultrasound) and a computer to make images of your heart before and after exercise. Ultrasound images that are taken before you exercise (your resting echocardiogram) will show how much blood is getting to your heart muscle and how well your heart muscle and heart valves are functioning. During the next part of this test, you will walk on a treadmill or ride a stationary bike to see how exercise affects your heart. While you exercise, the electrical activity of your heart will be monitored with an electrocardiogram (ECG). Your blood pressure will also be monitored. You may have this test if you:  Have chest pain or other symptoms of a heart problem.  Recently had a heart attack or heart surgery.  Have heart valve problems.  Have a condition that causes narrowing of the blood vessels that supply your heart (coronary artery disease).  Have a high risk of heart disease and are starting a new exercise program.  Have a high risk of heart disease and need to have major surgery. Tell a health care provider about:  Any allergies you have.  All medicines you are taking, including vitamins, herbs, eye drops, creams, and over-the-counter medicines.  Any problems you or family members have had with anesthetic medicines.  Any  blood disorders you have.  Any surgeries you have had.  Any medical conditions you have.  Whether you are pregnant or may be pregnant. What are the risks? Generally, this is a safe procedure. However, problems may occur, including:  Chest pain.  Dizziness or light-headedness.  Shortness of breath.  Increased or irregular heartbeat (palpitations).  Nausea or vomiting.  Heart attack (very rare). What happens before the procedure?  Follow instructions from your health care provider about eating or drinking restrictions. You may be asked to avoid all forms of caffeine for 24 hours before your procedure, or as told by your health care provider.  Ask your health care provider about changing or stopping your regular medicines. This is especially important if you are taking diabetes medicines or blood thinners.  If you use an inhaler, bring it with you to the test.  Wear loose, comfortable clothing and walking shoes.  Do notuse any products that contain nicotine or tobacco, such as cigarettes and e-cigarettes, for 4 hours before the test or as told by your health care provider. If you need help quitting, ask your health care provider. What happens during the procedure?  You will take off your clothes from the waist up and put on a hospital gown.  A technician will place electrodes on your chest.  A blood pressure cuff will be placed on your arm.  You will lie down on a table for an ultrasound exam before you  exercise. Gel will be rubbed on your chest, and a handheld device (transducer) will be pressed against your chest and moved over your heart.  Then, you will start exercising by walking on a treadmill or pedaling a stationary bicycle.  Your blood pressure and heart rhythm will be monitored while you exercise.  The exercise will gradually get harder or faster.  You will exercise until: ? Your heart reaches a target level. ? You are too tired to continue. ? You cannot  continue because of chest pain, weakness, or dizziness.  You will have another ultrasound exam after you stop exercising. The procedure may vary among health care providers and hospitals. What happens after the procedure?  Your heart rate and blood pressure will be monitored until they return to your normal levels. Summary  An exercise stress echocardiogram is a test that uses ultrasound to check how well your heart works before and after exercise.  Before the test, follow instructions from your health care provider about stopping medications, avoiding nicotine and tobacco, and avoiding certain foods and drinks.  During the test, your blood pressure and heart rhythm will be monitored while you exercise on a treadmill or stationary bicycle. This information is not intended to replace advice given to you by your health care provider. Make sure you discuss any questions you have with your health care provider. Document Released: 12/07/2004 Document Revised: 08/17/2016 Document Reviewed: 07/25/2016 Elsevier Patient Education  2020 [place], [name], MD  10/19/2019 9:31 AM    [place]

## 2019-10-22 ENCOUNTER — Other Ambulatory Visit: Payer: Self-pay | Admitting: Family Medicine

## 2019-10-22 ENCOUNTER — Ambulatory Visit: Payer: PRIVATE HEALTH INSURANCE | Admitting: Cardiology

## 2019-10-22 DIAGNOSIS — E119 Type 2 diabetes mellitus without complications: Secondary | ICD-10-CM

## 2019-10-28 ENCOUNTER — Other Ambulatory Visit: Payer: PRIVATE HEALTH INSURANCE

## 2019-10-29 ENCOUNTER — Other Ambulatory Visit: Payer: PRIVATE HEALTH INSURANCE

## 2019-11-05 ENCOUNTER — Telehealth: Payer: Self-pay

## 2019-11-05 ENCOUNTER — Other Ambulatory Visit: Payer: Self-pay

## 2019-11-05 DIAGNOSIS — I350 Nonrheumatic aortic (valve) stenosis: Secondary | ICD-10-CM

## 2019-11-05 NOTE — Progress Notes (Unsigned)
Spoke with Dr. [name]. Per his instructions patient is to proceed with the stress (treaddmill) echocardiogram scheduled on 11/09/19.  In addition patient will need a echocardiogram complete scheduled.

## 2019-11-05 NOTE — Telephone Encounter (Signed)
lmov to schedule with office holding 11/23 Monday afternoon spot for patient for now.

## 2019-11-05 NOTE — Telephone Encounter (Signed)
Contacted the patient to discuss Dr. Thereasa Solo change in her plan of care. Advised the patient that Dr. Mylo Red- Charlestine Night would like to convert her scheduled stress test to a complete echocardiogram. If her know AS is stable moderate or mild we woule then proceed with the stress echo. If her aortic stenosis has progressed to severe we may need to progress to a cardiac cath to r/o ischemia since the patient is claustrophobic and unable to tolerate scans (CT or myoview).  Patient sts that she was going to contact our office to reschedule her 11/09/19 appt for testing. Advised the patinet that COVID test in not need for a complete echo. I will cancel her COVID testing appt and the stress echo appt.  Advised the patient that I will fwd a message to scheduling to schedule the echo. Patient verbalized understanding and voiced appreciation for the call.  Contacted [facility] pre-test COVID site and cancelled the patients 11/06/19 appt.

## 2019-11-06 ENCOUNTER — Other Ambulatory Visit: Admission: RE | Admit: 2019-11-06 | Payer: PRIVATE HEALTH INSURANCE | Source: Ambulatory Visit

## 2019-11-09 ENCOUNTER — Other Ambulatory Visit: Payer: PRIVATE HEALTH INSURANCE

## 2019-11-26 ENCOUNTER — Telehealth: Payer: Self-pay

## 2019-11-26 NOTE — Telephone Encounter (Signed)
OK 

## 2019-11-26 NOTE — Telephone Encounter (Signed)
Will do!

## 2019-11-26 NOTE — Telephone Encounter (Signed)
[place] is asking if it is ok to change the manufacturer of her Levothyroxine.  [employer] brand is unavailable, but [employer] has it

## 2019-12-15 NOTE — Telephone Encounter (Signed)
Patient is scheduled with Dr. [name] on 12/28/19. She has not had the recommended echo. Msg fwd to scheduling to attempt to reach the patient again to schedule the echo.

## 2019-12-28 ENCOUNTER — Ambulatory Visit: Payer: PRIVATE HEALTH INSURANCE | Admitting: Cardiology

## 2020-01-01 ENCOUNTER — Other Ambulatory Visit: Payer: PRIVATE HEALTH INSURANCE

## 2020-01-02 ENCOUNTER — Other Ambulatory Visit: Payer: Self-pay

## 2020-01-02 ENCOUNTER — Emergency Department: Payer: 59

## 2020-01-02 ENCOUNTER — Encounter: Payer: Self-pay | Admitting: Emergency Medicine

## 2020-01-02 DIAGNOSIS — Z87891 Personal history of nicotine dependence: Secondary | ICD-10-CM | POA: Diagnosis not present

## 2020-01-02 DIAGNOSIS — E1140 Type 2 diabetes mellitus with diabetic neuropathy, unspecified: Secondary | ICD-10-CM | POA: Insufficient documentation

## 2020-01-02 DIAGNOSIS — E039 Hypothyroidism, unspecified: Secondary | ICD-10-CM | POA: Insufficient documentation

## 2020-01-02 DIAGNOSIS — Z7982 Long term (current) use of aspirin: Secondary | ICD-10-CM | POA: Diagnosis not present

## 2020-01-02 DIAGNOSIS — R42 Dizziness and giddiness: Secondary | ICD-10-CM | POA: Insufficient documentation

## 2020-01-02 DIAGNOSIS — Z7984 Long term (current) use of oral hypoglycemic drugs: Secondary | ICD-10-CM | POA: Diagnosis not present

## 2020-01-02 DIAGNOSIS — I10 Essential (primary) hypertension: Secondary | ICD-10-CM | POA: Insufficient documentation

## 2020-01-02 DIAGNOSIS — Z79899 Other long term (current) drug therapy: Secondary | ICD-10-CM | POA: Diagnosis not present

## 2020-01-02 DIAGNOSIS — H81399 Other peripheral vertigo, unspecified ear: Secondary | ICD-10-CM | POA: Insufficient documentation

## 2020-01-02 LAB — CBC
HCT: 42.1 % (ref 36.0–46.0)
Hemoglobin: 13.1 g/dL (ref 12.0–15.0)
MCH: 25.2 pg — ABNORMAL LOW (ref 26.0–34.0)
MCHC: 31.1 g/dL (ref 30.0–36.0)
MCV: 81.1 fL (ref 80.0–100.0)
Platelets: 137 10*3/uL — ABNORMAL LOW (ref 150–400)
RBC: 5.19 MIL/uL — ABNORMAL HIGH (ref 3.87–5.11)
RDW: 14 % (ref 11.5–15.5)
WBC: 7.5 10*3/uL (ref 4.0–10.5)
nRBC: 0 % (ref 0.0–0.2)

## 2020-01-02 LAB — BASIC METABOLIC PANEL
Anion gap: 9 (ref 5–15)
BUN: 15 mg/dL (ref 6–20)
CO2: 30 mmol/L (ref 22–32)
Calcium: 9.2 mg/dL (ref 8.9–10.3)
Chloride: 99 mmol/L (ref 98–111)
Creatinine, Ser: 0.72 mg/dL (ref 0.44–1.00)
GFR calc Af Amer: >60 mL/min (ref >60)
GFR calc non Af Amer: >60 mL/min (ref >60)
Glucose, Bld: 192 mg/dL — ABNORMAL HIGH (ref 70–99)
Potassium: 3.4 mmol/L — ABNORMAL LOW (ref 3.5–5.1)
Sodium: 138 mmol/L (ref 135–145)

## 2020-01-02 LAB — TROPONIN I (HIGH SENSITIVITY): Troponin I (High Sensitivity): 4 ng/L (ref <18)

## 2020-01-02 MED ORDER — SODIUM CHLORIDE 0.9% FLUSH: 3.0000 mL | Freq: Once | INTRAVENOUS | Status: DC

## 2020-01-02 NOTE — ED Triage Notes (Signed)
States has had dizziness x 3 days. Smile symmetrical, grips and leg strength equal. Denies dizziness sitting in recliner but states certain positions exacerbate it.

## 2020-01-03 ENCOUNTER — Emergency Department
Admission: EM | Admit: 2020-01-03 | Discharge: 2020-01-03 | Disposition: A | Discharge status: Home / Self Care | Payer: 59 | Attending: Emergency Medicine | Admitting: Emergency Medicine

## 2020-01-03 DIAGNOSIS — R42 Dizziness and giddiness: Secondary | ICD-10-CM

## 2020-01-03 MED ORDER — MECLIZINE HCL 25 MG PO TABS: 25.0000 mg | ORAL_TABLET | Freq: Three times a day (TID) | ORAL | 0 refills | Status: DC | PRN

## 2020-01-03 MED ORDER — MECLIZINE HCL 25 MG PO TABS
25.0000 mg | ORAL_TABLET | Freq: Once | ORAL | Status: AC
  Administered 2020-01-03: 25 mg via ORAL
  Filled 2020-01-03: qty 1

## 2020-01-03 NOTE — ED Notes (Signed)
Patient reports having vertigo since Wednesday and today noticed blood pressure was elevated.  Patient reports had vertigo several months ago but it was not as bad as this time.  Patient reports continued dizziness especially with movement.

## 2020-01-03 NOTE — ED Provider Notes (Signed)
[facility] Emergency Department Provider Note  Time seen: 2:20 AM  I have reviewed the triage vital signs and the nursing notes.   HISTORY  Chief Complaint Dizziness and Hypertension   HPI Emily Tapia is a 59 y.o. female with a past medical history of anxiety, diabetes, hypertension presents to the emergency department for dizziness.  According to the patient beginning Wednesday she states she rolled over in bed and the room began spinning around her, was very nauseating she vomited several times.  States after an hour or 2 it calm down, and the patient states she did okay until the following day when she bent over after showering and the room began spinning around her once again.  Patient states she had a similar episode approximately 1 year ago but only lasted 1 day and then it resolved.  Patient went to her doctor today for evaluation and was sent to the emergency department.  Here the patient appears well, denies any symptoms currently, states she has been very careful to keep her head upright, has been sleeping in a recliner to do the same at night.  Denies any fever cough or shortness of breath.  Denies any weakness numbness confusion or slurred speech.   Past Medical History:  Diagnosis Date  . Anxiety   . Aortic stenosis   . Diabetes mellitus   . Edema   . Heart murmur   . Hypertension   . Sleep apnea   . Thyroid disease     Patient Active Problem List   Diagnosis Date Noted  . Pelvic mass 07/08/2015  . Hyperlipidemia, mixed 07/08/2015  . Hx of iron deficiency 07/08/2015  . Absolute anemia 04/27/2015  . Diabetic peripheral neuropathy associated with type 2 diabetes mellitus ([place]) 04/27/2015  . Breath shortness 04/27/2015  . Accumulation of fluid in tissues 04/27/2015  . Cardiac murmur 04/27/2015  . Elective abortion 04/27/2015  . HLD (hyperlipidemia) 04/27/2015  . Decreased potassium in the blood 04/27/2015  . Hypothyroidism 04/27/2015   . Obstructive sleep apnea 04/27/2015  . Disorder of peripheral nervous system 04/27/2015  . Obstructive apnea 04/27/2015  . Pain in shoulder 04/27/2015  . Diabetes mellitus, type 2 ([place]) 04/27/2015  . Fibroid uterus 10/25/2013  . Aortic valve stenosis 05/01/2011  . Edema 03/27/2011  . SOB (shortness of breath) 03/27/2011  . Diabetes mellitus ([place]) 03/27/2011  . HTN (hypertension) 03/27/2011  . Essential (primary) hypertension 07/05/2006  . Anxiety, generalized 07/05/2006    Past Surgical History:  Procedure Laterality Date  . ABDOMINAL HYSTERECTOMY  11/17/2013  . BREAST BIOPSY     x2  . CESAREAN SECTION     x 2  . CHOLECYSTECTOMY  2003   DUE TO STONES  . DILATION AND CURETTAGE OF UTERUS    . ELBOW SURGERY    . POLYPECTOMY  1995    Prior to Admission medications   Medication Sig Start Date End Date Taking? Authorizing Provider  aspirin 81 MG EC tablet Take 81 mg by mouth at bedtime.     [provider]  Cholecalciferol (VITAMIN D3) 1000 UNITS CAPS Take 1 capsule by mouth every morning.     [provider]  Ferrous Sulfate (IRON) 325 (65 Fe) MG TABS Take 1 tablet by mouth once daily with breakfast 04/24/19   Chrismon, Simona Huh E, PA  furosemide (LASIX) 40 MG tablet TAKE 1 TABLET BY MOUTH ONCE DAILY AS NEEDED FOR FLUID OR EDEMA 05/02/18   Chrismon, Vickki Muff, PA  Glucosamine  500 MG CAPS Take 500 mg by mouth 2 (two) times daily.    [provider]  glucose blood (CONTOUR NEXT TEST) test strip Test fasting sugar each morning. Recheck if having hypoglycemic symptoms. 10/03/18   Chrismon, Simona Huh E, PA  glyBURIDE (DIABETA) 5 MG tablet TAKE 1 TABLET BY MOUTH TWICE DAILY WITH MEALS . 10/22/19   Chrismon, Vickki Muff, PA  levothyroxine (SYNTHROID) 75 MCG tablet TAKE 1 TABLET BY MOUTH ONCE DAILY BEFORE BREAKFAST 08/24/19   Chrismon, Vickki Muff, PA  lisinopril-hydrochlorothiazide (ZESTORETIC) 20-25 MG tablet Take 1 tablet by mouth once daily 09/27/19   Chrismon, Vickki Muff, PA   meclizine (ANTIVERT) 25 MG tablet Take 1 tablet (25 mg total) by mouth 3 (three) times daily as needed for dizziness. 01/03/20   Harvest Dark, MD  metFORMIN (GLUCOPHAGE) 1000 MG tablet Take 1 tablet (1,000 mg total) by mouth 2 (two) times daily with a meal. 08/24/19   Chrismon, Vickki Muff, PA  metoprolol tartrate (LOPRESSOR) 25 MG tablet Take 1 tablet (25 mg total) by mouth 3 (three) times daily. 09/14/19   Chrismon, Vickki Muff, PA  Multiple Vitamin (MULTIVITAMIN) tablet Take 1 tablet by mouth every morning.     [provider]  PARoxetine (PAXIL) 20 MG tablet TAKE 1 TABLET BY MOUTH ONCE DAILY. SCHEDULE FOLLOW UP APPOINTMENT AND FASTING LABS IN THE NEXT 1 TO 2 MONTHS. 09/27/19   Chrismon, Vickki Muff, PA    No Known Allergies  Family History  Problem Relation Age of Onset  . Hyperlipidemia Mother   . Thyroid disease Mother   . Hashimoto's thyroiditis Mother   . Heart disease Father        open heart surgery  . Heart failure Father   . Diabetes Father   . Dementia Father   . Thyroid disease Brother   . Breast cancer Maternal Grandmother   . Diabetes Paternal Grandmother   . Prostate cancer Paternal Grandfather     Social History Social History   Tobacco Use  . Smoking status: Former Smoker    Packs/day: 1.50    Years: 25.00    Pack years: 37.50    Types: Cigarettes    Quit date: 11/24/2001    Years since quitting: 18.1  . Smokeless tobacco: Never Used  Substance Use Topics  . Alcohol use: Yes    Alcohol/week: 0.0 standard drinks    Comment: OCCASIONALLY DRINKS WINE, ONCE A WEEK  . Drug use: No    Review of Systems Constitutional: Negative for fever. Cardiovascular: Negative for chest pain. Respiratory: Negative for shortness of breath. Gastrointestinal: Negative for abdominal pain.  Several episodes of vomiting when the spinning first began on Wednesday. Musculoskeletal: Negative for musculoskeletal complaints Neurological: Negative for headache All other ROS  negative  ____________________________________________   PHYSICAL EXAM:  VITAL SIGNS: ED Triage Vitals  Enc Vitals Group     BP 01/02/20 1523 (!) 192/69     Pulse Rate 01/02/20 1523 66     Resp 01/02/20 1523 18     Temp 01/02/20 1523 98.8 F (37.1 C)     Temp Source 01/02/20 1523 Oral     SpO2 01/02/20 1523 96 %     Weight 01/02/20 1528 193 lb (87.5 kg)     Height 01/02/20 1528 5\' 4"  (1.626 m)     Head Circumference --      Peak Flow --      Pain Score 01/02/20 1528 0     Pain Loc --  Pain Edu? --      Excl. in [place]? --    Constitutional: Alert and oriented. Well appearing and in no distress. Eyes: Normal exam ENT      Head: Normocephalic and atraumatic.      Mouth/Throat: Mucous membranes are moist. Cardiovascular: Normal rate, regular rhythm.  Respiratory: Normal respiratory effort without tachypnea nor retractions. Breath sounds are clear  Gastrointestinal: Soft and nontender. No distention.   Musculoskeletal: Nontender with normal range of motion in all extremities. Neurologic:  Normal speech and language. No gross focal neurologic deficits.  Equal grip strength bilaterally.  No pronator drift. Skin:  Skin is warm, dry and intact.  Psychiatric: Mood and affect are normal.   ____________________________________________    EKG  EKG viewed and interpreted by myself shows a normal sinus rhythm at 60 bpm with a narrow QRS, normal axis, normal intervals, no concerning ST changes.  ____________________________________________    RADIOLOGY  Chest x-ray negative  ____________________________________________   INITIAL IMPRESSION / ASSESSMENT AND PLAN / ED COURSE  Pertinent labs & imaging results that were available during my care of the patient were reviewed by me and considered in my medical decision making (see chart for details).   Patient presents to the emergency department for intermittent spinning/dizziness that occurs with changes in position.   Patient's lab work is largely reassuring, EKG is reassuring.  Patient symptoms are very suggestive of BPPV.  Patient's neurologic exam is reassuring as well.  Patient strongly does not want a CT or MRI performed due to severe claustrophobia, however as the patient's symptoms are so suggestive of BPPV and the patient is symptom-free at this time I do not believe neurologic imaging is required.  We will treat with meclizine.  Patient will follow up with her doctor.  Patient agreeable to plan of care.  I discussed my typical vertigo return precautions.  Sylena Rasul was evaluated in Emergency Department on 01/03/2020 for the symptoms described in the history of present illness. She was evaluated in the context of the global COVID-19 pandemic, which necessitated consideration that the patient might be at risk for infection with the SARS-CoV-2 virus that causes COVID-19. Institutional protocols and algorithms that pertain to the evaluation of patients at risk for COVID-19 are in a state of rapid change based on information released by regulatory bodies including the CDC and federal and state organizations. These policies and algorithms were followed during the patient's care in the ED.  ____________________________________________   FINAL CLINICAL IMPRESSION(S) / ED DIAGNOSES  Benign paroxysmal positional vertigo   Harvest Dark, MD 01/03/20 [phone]

## 2020-01-04 ENCOUNTER — Ambulatory Visit: Payer: PRIVATE HEALTH INSURANCE | Admitting: Cardiology

## 2020-01-07 ENCOUNTER — Ambulatory Visit: Payer: Self-pay

## 2020-01-07 NOTE — Telephone Encounter (Addendum)
Pt. Reports she was seed in ED 01/03/20 with dizziness and elevated BP. Reports she is still having dizziness - worse when she lays down and bends over. Has vertigo.Denies any other symptoms other than nausea with this. Has Meclizine "and it helps some." Appointment made for tomorrow.  Reason for Disposition . Vomiting occurs with dizziness  Answer Assessment - Initial Assessment Questions 1. DESCRIPTION: "Describe your dizziness."     Dizzy 2. VERTIGO: "Do you feel like either you or the room is spinning or tilting?"      Yes 3. LIGHTHEADED: "Do you feel lightheaded?" (e.g., somewhat faint, woozy, weak upon standing)     Lightheaeded 4. SEVERITY: "How bad is it?"  "Can you walk?"   - MILD - Feels unsteady but walking normally.   - MODERATE - Feels very unsteady when walking, but not falling; interferes with normal activities (e.g., school, work) .   - SEVERE - Unable to walk without falling (requires assistance).     Moderate 5. ONSET:  "When did the dizziness begin?"      Started last Wednesday 6. AGGRAVATING FACTORS: "Does anything make it worse?" (e.g., standing, change in head position)     Laying down 7. CAUSE: "What do you think is causing the dizziness?"     Unsure 8. RECURRENT SYMPTOM: "Have you had dizziness before?" If so, ask: "When was the last time?" "What happened that time?"     6 MONTHS ago 9. OTHER SYMPTOMS: "Do you have any other symptoms?" (e.g., headache, weakness, numbness, vomiting, earache)     Tired 10. PREGNANCY: "Is there any chance you are pregnant?" "When was your last menstrual period?"       No  Protocols used: DIZZINESS - VERTIGO-A-AH

## 2020-01-07 NOTE — Progress Notes (Signed)
Patient: Emily Tapia Female    DOB: 1961-09-04   59 y.o.   MRN: [id number] Visit Date: 01/08/2020  Today's Provider: Trinna Post, PA-C   Chief Complaint  Patient presents with  . Follow-up    ER visit   Subjective:      Follow up ER visit  Patient was seen in ER for dizziness on 01/03/2020. She was treated for vertigo. Treatment for this included labs, EKG, x-ray. She reports poor compliance with treatment. She states that the provider at the hospital started her on Meclizine 25MG , which has not helped. She reports this condition is Unchanged. Patient states that she still gets dizzy when bending over, laying back and standing up.    She has had vertigo prior 6 months ago that resolved. She is currently having symptoms intermittently but not as badly as before.  ------------------------------------------------------------------------------------   No Known Allergies   Current Outpatient Medications:  .  aspirin 81 MG EC tablet, Take 81 mg by mouth at bedtime. , Disp: , Rfl:  .  Cholecalciferol (VITAMIN D3) 1000 UNITS CAPS, Take 1 capsule by mouth every morning. , Disp: , Rfl:  .  Ferrous Sulfate (IRON) 325 (65 Fe) MG TABS, Take 1 tablet by mouth once daily with breakfast, Disp: 90 tablet, Rfl: 0 .  furosemide (LASIX) 40 MG tablet, TAKE 1 TABLET BY MOUTH ONCE DAILY AS NEEDED FOR FLUID OR EDEMA, Disp: 90 tablet, Rfl: 1 .  Glucosamine 500 MG CAPS, Take 500 mg by mouth 2 (two) times daily., Disp: , Rfl:  .  glucose blood (CONTOUR NEXT TEST) test strip, Test fasting sugar each morning. Recheck if having hypoglycemic symptoms., Disp: 100 each, Rfl: 4 .  glyBURIDE (DIABETA) 5 MG tablet, TAKE 1 TABLET BY MOUTH TWICE DAILY WITH MEALS ., Disp: 180 tablet, Rfl: 0 .  levothyroxine (SYNTHROID) 75 MCG tablet, TAKE 1 TABLET BY MOUTH ONCE DAILY BEFORE BREAKFAST, Disp: 90 tablet, Rfl: 3 .  lisinopril-hydrochlorothiazide (ZESTORETIC) 20-25 MG tablet, Take 1 tablet by mouth  once daily, Disp: 90 tablet, Rfl: 3 .  meclizine (ANTIVERT) 25 MG tablet, Take 1 tablet (25 mg total) by mouth 3 (three) times daily as needed for dizziness., Disp: 30 tablet, Rfl: 0 .  metFORMIN (GLUCOPHAGE) 1000 MG tablet, Take 1 tablet (1,000 mg total) by mouth 2 (two) times daily with a meal., Disp: 180 tablet, Rfl: 3 .  metoprolol tartrate (LOPRESSOR) 25 MG tablet, Take 1 tablet (25 mg total) by mouth 3 (three) times daily., Disp: 270 tablet, Rfl: 3 .  Multiple Vitamin (MULTIVITAMIN) tablet, Take 1 tablet by mouth every morning. , Disp: , Rfl:  .  PARoxetine (PAXIL) 20 MG tablet, TAKE 1 TABLET BY MOUTH ONCE DAILY. SCHEDULE FOLLOW UP APPOINTMENT AND FASTING LABS IN THE NEXT 1 TO 2 MONTHS., Disp: 90 tablet, Rfl: 3  Review of Systems  Constitutional: Negative.   Respiratory: Negative.   Cardiovascular: Negative.   Neurological: Positive for dizziness.    Social History   Tobacco Use  . Smoking status: Former Smoker    Packs/day: 1.50    Years: 25.00    Pack years: 37.50    Types: Cigarettes    Quit date: 11/24/2001    Years since quitting: 18.1  . Smokeless tobacco: Never Used  Substance Use Topics  . Alcohol use: Yes    Alcohol/week: 0.0 standard drinks    Comment: OCCASIONALLY DRINKS WINE, ONCE A WEEK      Objective:  BP (!) 142/88 (BP Location: Left Arm, Patient Position: Sitting, Cuff Size: Normal)   Pulse 69   Temp (!) 96.9 F (36.1 C) (Temporal)   Wt 197 lb (89.4 kg)   SpO2 97%   BMI 33.81 kg/m  Vitals:   01/08/20 0827  BP: (!) 142/88  Pulse: 69  Temp: (!) 96.9 F (36.1 C)  TempSrc: Temporal  SpO2: 97%  Weight: 197 lb (89.4 kg)  Body mass index is 33.81 kg/m.   Physical Exam Constitutional:      Appearance: Normal appearance.  HENT:     Right Ear: Tympanic membrane and ear canal normal.     Left Ear: Tympanic membrane and ear canal normal.  Eyes:     Extraocular Movements: Extraocular movements intact.     Pupils: Pupils are equal, round, and  reactive to light.  Cardiovascular:     Rate and Rhythm: Normal rate and regular rhythm.  Pulmonary:     Effort: Pulmonary effort is normal.     Breath sounds: Normal breath sounds.  Skin:    General: Skin is warm and dry.  Neurological:     Mental Status: She is alert and oriented to person, place, and time. Mental status is at baseline.  Psychiatric:        Mood and Affect: Mood normal.        Behavior: Behavior normal.      No results found for any visits on 01/08/20.     Assessment & Plan    1. Vertigo  Explained nature of vertigo - how it may be recurrent. Can continue meclizine use. Printed out [employer]. If not improving, refer to ENT.   The entirety of the information documented in the History of Present Illness, Review of Systems and Physical Exam were personally obtained by me. Portions of this information were initially documented by [facility] and reviewed by me for thoroughness and accuracy.       Trinna Post, PA-C  [facility]

## 2020-01-08 ENCOUNTER — Encounter: Payer: Self-pay | Admitting: Physician Assistant

## 2020-01-08 ENCOUNTER — Other Ambulatory Visit: Payer: Self-pay

## 2020-01-08 ENCOUNTER — Ambulatory Visit: Payer: 59 | Admitting: Physician Assistant

## 2020-01-08 VITALS — BP 142/88 | HR 69 | Temp 96.9°F | Wt 197.0 lb

## 2020-01-08 DIAGNOSIS — R42 Dizziness and giddiness: Secondary | ICD-10-CM

## 2020-01-08 NOTE — Patient Instructions (Addendum)

## 2020-01-18 ENCOUNTER — Other Ambulatory Visit: Payer: Self-pay | Admitting: Family Medicine

## 2020-01-18 DIAGNOSIS — E119 Type 2 diabetes mellitus without complications: Secondary | ICD-10-CM

## 2020-01-28 ENCOUNTER — Other Ambulatory Visit: Payer: PRIVATE HEALTH INSURANCE

## 2020-02-01 ENCOUNTER — Ambulatory Visit: Payer: PRIVATE HEALTH INSURANCE | Admitting: Cardiology

## 2020-02-25 NOTE — Progress Notes (Signed)
This encounter was created in error - please disregard.

## 2020-02-26 ENCOUNTER — Other Ambulatory Visit: Payer: No Typology Code available for payment source

## 2020-03-03 ENCOUNTER — Ambulatory Visit: Payer: Self-pay | Admitting: Cardiology

## 2020-03-15 ENCOUNTER — Other Ambulatory Visit: Payer: Self-pay

## 2020-03-15 ENCOUNTER — Ambulatory Visit (INDEPENDENT_AMBULATORY_CARE_PROVIDER_SITE_OTHER): Payer: 59

## 2020-03-15 DIAGNOSIS — I350 Nonrheumatic aortic (valve) stenosis: Secondary | ICD-10-CM | POA: Diagnosis not present

## 2020-03-25 ENCOUNTER — Ambulatory Visit (INDEPENDENT_AMBULATORY_CARE_PROVIDER_SITE_OTHER): Payer: 59 | Admitting: Cardiology

## 2020-03-25 ENCOUNTER — Encounter: Payer: Self-pay | Admitting: Cardiology

## 2020-03-25 ENCOUNTER — Other Ambulatory Visit: Payer: Self-pay

## 2020-03-25 VITALS — BP 150/80 | HR 82 | Ht 64.0 in | Wt 195.4 lb

## 2020-03-25 DIAGNOSIS — I350 Nonrheumatic aortic (valve) stenosis: Secondary | ICD-10-CM

## 2020-03-25 DIAGNOSIS — I10 Essential (primary) hypertension: Secondary | ICD-10-CM | POA: Diagnosis not present

## 2020-03-25 DIAGNOSIS — Z6833 Body mass index (BMI) 33.0-33.9, adult: Secondary | ICD-10-CM

## 2020-03-25 MED ORDER — LISINOPRIL 40 MG PO TABS: 40.0000 mg | ORAL_TABLET | Freq: Every day | ORAL | 2 refills | Status: DC

## 2020-03-25 MED ORDER — HYDROCHLOROTHIAZIDE 25 MG PO TABS: 25.0000 mg | ORAL_TABLET | Freq: Every day | ORAL | 2 refills | Status: DC

## 2020-03-25 NOTE — Patient Instructions (Addendum)
Medication Instructions:  Your physician has recommended you make the following change in your medication:  1- STOP Zestoretic . 2- START Lisinopril 40 mg by mouth once a day. 3- START Hydrochlorothiazide 25 mg by mouth once a day.  *If you need a refill on your cardiac medications before your next appointment, please call your pharmacy*  Follow-Up: At [facility], you and your health needs are our priority.  As part of our continuing mission to provide you with exceptional heart care, we have created designated Provider Care Teams.  These Care Teams include your primary Cardiologist (physician) and Advanced Practice Providers (APPs -  Physician Assistants and Nurse Practitioners) who all work together to provide you with the care you need, when you need it.  We recommend signing up for the patient portal called "MyChart".  Sign up information is provided on this After Visit Summary.  MyChart is used to connect with patients for Virtual Visits (Telemedicine).  Patients are able to view lab/test results, encounter notes, upcoming appointments, etc.  Non-urgent messages can be sent to your provider as well.   To learn more about what you can do with MyChart, go to [email or web address].    Your next appointment:   6 month(s)  The format for your next appointment:   In Person  Provider:   [name], MD

## 2020-03-25 NOTE — Progress Notes (Signed)
Cardiology Office Note:    Date:  03/25/2020   ID:  Emily Tapia, DOB July 16, 1961, MRN [id number]  PCP:  Margo Common, PA  Cardiologist:  [name], MD  Electrophysiologist:  None   Referring MD: Margo Common, PA   Chief Complaint  Patient presents with  . office visit    pt states mild SOB, hx of swelling in legs, fatigue, feeling off balanced. Meds verbally reviewed w/ pt.    History of Present Illness:    Emily Tapia is a 59 y.o. female with a hx of hypertension, diabetes, moderate aortic stenosis who presents for follow-up.  She was last seen due to aortic valve stenosis and hypertension.  Echocardiogram was ordered to evaluate aortic valve disease progression.  Patient states doing okay since the last visit.  Has some swelling in her legs for which she occasionally uses Lasix as needed.  She also was diagnosed with vertigo and started on Antivert.   Past Medical History:  Diagnosis Date  . Anxiety   . Aortic stenosis   . Diabetes mellitus   . Edema   . Heart murmur   . Hypertension   . Sleep apnea   . Thyroid disease     Past Surgical History:  Procedure Laterality Date  . ABDOMINAL HYSTERECTOMY  11/17/2013  . BREAST BIOPSY     x2  . CESAREAN SECTION     x 2  . CHOLECYSTECTOMY  2003   DUE TO STONES  . DILATION AND CURETTAGE OF UTERUS    . ELBOW SURGERY    . POLYPECTOMY  1995    Current Medications: Current Meds  Medication Sig  . aspirin 81 MG EC tablet Take 81 mg by mouth at bedtime.   . Cholecalciferol (VITAMIN D3) 1000 UNITS CAPS Take 1 capsule by mouth every morning.   . Ferrous Sulfate (IRON) 325 (65 Fe) MG TABS Take 1 tablet by mouth once daily with breakfast  . furosemide (LASIX) 40 MG tablet TAKE 1 TABLET BY MOUTH ONCE DAILY AS NEEDED FOR FLUID OR EDEMA  . Glucosamine 500 MG CAPS Take 500 mg by mouth 2 (two) times daily.  Marland Kitchen glucose blood (CONTOUR NEXT TEST) test strip Test fasting sugar each morning. Recheck if  having hypoglycemic symptoms.  Marland Kitchen glyBURIDE (DIABETA) 5 MG tablet TAKE 1 TABLET BY MOUTH TWICE DAILY WITH MEALS  . levothyroxine (SYNTHROID) 75 MCG tablet TAKE 1 TABLET BY MOUTH ONCE DAILY BEFORE BREAKFAST  . meclizine (ANTIVERT) 25 MG tablet Take 1 tablet (25 mg total) by mouth 3 (three) times daily as needed for dizziness.  . metFORMIN (GLUCOPHAGE) 1000 MG tablet Take 1 tablet (1,000 mg total) by mouth 2 (two) times daily with a meal.  . metoprolol tartrate (LOPRESSOR) 25 MG tablet Take 1 tablet (25 mg total) by mouth 3 (three) times daily.  . Multiple Vitamin (MULTIVITAMIN) tablet Take 1 tablet by mouth every morning.   Marland Kitchen PARoxetine (PAXIL) 20 MG tablet TAKE 1 TABLET BY MOUTH ONCE DAILY. SCHEDULE FOLLOW UP APPOINTMENT AND FASTING LABS IN THE NEXT 1 TO 2 MONTHS.  . [DISCONTINUED] lisinopril-hydrochlorothiazide (ZESTORETIC) 20-25 MG tablet Take 1 tablet by mouth once daily     Allergies:   Patient has no known allergies.   Social History   Socioeconomic History  . Marital status: Married    Spouse name: [name]  . Number of children: 2  . Years of education: 109  . Highest education level: Not on file  Occupational History  .  Occupation: [employer]  Tobacco Use  . Smoking status: Former Smoker    Packs/day: 1.50    Years: 25.00    Pack years: 37.50    Types: Cigarettes    Quit date: 11/24/2001    Years since quitting: 18.3  . Smokeless tobacco: Never Used  Substance and Sexual Activity  . Alcohol use: Yes    Alcohol/week: 0.0 standard drinks    Comment: OCCASIONALLY DRINKS WINE, ONCE A WEEK  . Drug use: No  . Sexual activity: Not on file  Other Topics Concern  . Not on file  Social History Narrative  . Not on file   Social Determinants of Health   Financial Resource Strain:   . Difficulty of Paying Living Expenses:   Food Insecurity:   . Worried About Charity fundraiser in the Last Year:   . Arboriculturist in the Last Year:   Transportation Needs:   . Lexicographer (Medical):   Marland Kitchen Lack of Transportation (Non-Medical):   Physical Activity:   . Days of Exercise per Week:   . Minutes of Exercise per Session:   Stress:   . Feeling of Stress :   Social Connections:   . Frequency of Communication with Friends and Family:   . Frequency of Social Gatherings with Friends and Family:   . Attends Religious Services:   . Active Member of Clubs or Organizations:   . Attends Archivist Meetings:   Marland Kitchen Marital Status:      Family History: The patient's family history includes Breast cancer in her maternal grandmother; Dementia in her father; Diabetes in her father and paternal grandmother; Hashimoto's thyroiditis in her mother; Heart disease in her father; Heart failure in her father; Hyperlipidemia in her mother; Prostate cancer in her paternal grandfather; Thyroid disease in her brother and mother.  ROS:   Please see the history of present illness.     All other systems reviewed and are negative.  EKGs/Labs/Other Studies Reviewed:    The following studies were reviewed today: Echocardiogram date 11/02/2016  Left ventricle: The cavity size was normal. There was mild   concentric hypertrophy. Systolic function was normal. The   estimated ejection fraction was in the range of 60% to 65%. Wall   motion was normal; there were no regional wall motion   abnormalities. Left ventricular diastolic function parameters   were normal. - Aortic valve: Transvalvular velocity was increased. There was   moderate stenosis. Peak velocity (S): 380 cm/s. Mean gradient   (S): 30 mm Hg. Peak gradient (S): 60 mm Hg. Valve area (VTI):   1.13 cm^2. - Aortic root: The aortic root was normal in size. - Right ventricle: Systolic function was normal. - Pulmonary arteries: Systolic pressure was within the normal   range.  EKG:  EKG is  ordered today.  The ekg ordered today demonstrates normal sinus rhythm, cannot rule out septal infarct.  Recent  Labs: 08/14/2019: ALT 37; TSH 2.790 01/02/2020: BUN 15; Creatinine, Ser 0.72; Hemoglobin 13.1; Platelets 137; Potassium 3.4; Sodium 138  Recent Lipid Panel    Component Value Date/Time   CHOL 151 08/14/2019 0920   TRIG 187 (H) 08/14/2019 0920   HDL 29 (L) 08/14/2019 0920   CHOLHDL 5.2 (H) 08/14/2019 0920   LDLCALC 85 08/14/2019 0920    Physical Exam:    VS:  BP (!) 150/80 (BP Location: Left Arm, Patient Position: Sitting, Cuff Size: Normal)   Pulse 82   Ht 5'  4" (1.626 m)   Wt 195 lb 6 oz (88.6 kg)   SpO2 96%   BMI 33.54 kg/m     Wt Readings from Last 3 Encounters:  03/25/20 195 lb 6 oz (88.6 kg)  01/08/20 197 lb (89.4 kg)  01/02/20 193 lb (87.5 kg)     GEN:  Well nourished, well developed in no acute distress HEENT: Normal NECK: No JVD; No carotid bruits LYMPHATICS: No lymphadenopathy CARDIAC: RRR, 3/6 systolic murmur right upper sternal border, no rubs, no gallops RESPIRATORY:  Clear to auscultation without rales, wheezing or rhonchi  ABDOMEN: Soft, non-tender, non-distended MUSCULOSKELETAL:  No edema; No deformity  SKIN: Warm and dry NEUROLOGIC:  Alert and oriented x 3 PSYCHIATRIC:  Normal affect   ASSESSMENT:    1. Aortic valve stenosis, etiology of cardiac valve disease unspecified   2. Essential hypertension   3. BMI 33.0-33.9,adult    PLAN:    In order of problems listed above:  1. Patient with history of moderate aortic stenosis.  Repeat echocardiogram on 02/2020 showed normal ejection fraction with EF [id number], grade 1 diastolic dysfunction, moderate aortic stenosis. 2. History of hypertension, blood pressure elevated.  Increase lisinopril to 40 mg daily, continue HCTZ. 3. Weight loss and exercise advised.  Follow-up in 6 months   This note was generated in part or whole with voice recognition software. Voice recognition is usually quite accurate but there are transcription errors that can and very often do occur. I apologize for any typographical errors  that were not detected and corrected.  Medication Adjustments/Labs and Tests Ordered: Current medicines are reviewed at length with the patient today.  Concerns regarding medicines are outlined above.  Orders Placed This Encounter  Procedures  . EKG 12-Lead   Meds ordered this encounter  Medications  . lisinopril (ZESTRIL) 40 MG tablet    Sig: Take 1 tablet (40 mg total) by mouth daily.    Dispense:  90 tablet    Refill:  2  . hydrochlorothiazide (HYDRODIURIL) 25 MG tablet    Sig: Take 1 tablet (25 mg total) by mouth daily.    Dispense:  90 tablet    Refill:  2    Patient Instructions  Medication Instructions:  Your physician has recommended you make the following change in your medication:  1- STOP Zestoretic . 2- START Lisinopril 40 mg by mouth once a day. 3- START Hydrochlorothiazide 25 mg by mouth once a day.  *If you need a refill on your cardiac medications before your next appointment, please call your pharmacy*  Follow-Up: At [facility], you and your health needs are our priority.  As part of our continuing mission to provide you with exceptional heart care, we have created designated Provider Care Teams.  These Care Teams include your primary Cardiologist (physician) and Advanced Practice Providers (APPs -  Physician Assistants and Nurse Practitioners) who all work together to provide you with the care you need, when you need it.  We recommend signing up for the patient portal called "MyChart".  Sign up information is provided on this After Visit Summary.  MyChart is used to connect with patients for Virtual Visits (Telemedicine).  Patients are able to view lab/test results, encounter notes, upcoming appointments, etc.  Non-urgent messages can be sent to your provider as well.   To learn more about what you can do with MyChart, go to [email or web address].    Your next appointment:   6 month(s)  The format for your next appointment:  In Person  Provider:    [name], MD     Signed, [name], MD  03/25/2020 4:46 PM    [place]

## 2020-03-28 ENCOUNTER — Other Ambulatory Visit: Payer: Self-pay | Admitting: Family Medicine

## 2020-03-28 NOTE — Telephone Encounter (Signed)
Requested medication (s) are due for refill today: no  Requested medication (s) are on the active medication list: yes  Last refill:  02/23/2019  Future visit scheduled: no  Notes to clinic: medication was last filled over a year ago Review for refill   Requested Prescriptions  Pending Prescriptions Disp Refills   furosemide (LASIX) 40 MG tablet [Pharmacy Med Name: Furosemide 40 MG Oral Tablet] 90 tablet 0    Sig: TAKE 1 TABLET BY MOUTH ONCE DAILY AS NEEDED FOR FLUID OR EDEMA      Cardiovascular:  Diuretics - Loop Failed - 03/28/2020  5:30 AM      Failed - K in normal range and within 360 days    Potassium  Date Value Ref Range Status  01/02/2020 3.4 (L) 3.5 - 5.1 mmol/L Final          Failed - Last BP in normal range    BP Readings from Last 1 Encounters:  03/25/20 (!) 150/80          Passed - Ca in normal range and within 360 days    Calcium  Date Value Ref Range Status  01/02/2020 9.2 8.9 - 10.3 mg/dL Final          Passed - Na in normal range and within 360 days    Sodium  Date Value Ref Range Status  01/02/2020 138 135 - 145 mmol/L Final  08/14/2019 138 134 - 144 mmol/L Final          Passed - Cr in normal range and within 360 days    Creatinine, Ser  Date Value Ref Range Status  01/02/2020 0.72 0.44 - 1.00 mg/dL Final   Creatinine, POC  Date Value Ref Range Status  02/24/2016 NA mg/dL Final          Passed - Valid encounter within last 6 months    Recent Outpatient Visits           2 months ago [place], Adriana M, PA-C   7 months ago Type 2 diabetes mellitus with diabetic neuropathy, without long-term current use of insulin ([place])   [place], [place], [place]   7 months ago Type 2 diabetes mellitus with diabetic neuropathy, without long-term current use of insulin [facility])   [place], [place], PA-C   7 months ago Type 2 diabetes mellitus with diabetic neuropathy,  unspecified whether long term insulin use [facility])   [facility] [place], Wendee [place], [place]   1 year ago Essential hypertension   [facility], Vickki Muff, [place]       Future Appointments             In 6 months Agbor-Etang, Aaron Edelman, MD [facility], [facility]

## 2020-04-25 ENCOUNTER — Other Ambulatory Visit: Payer: Self-pay | Admitting: Family Medicine

## 2020-04-25 DIAGNOSIS — E119 Type 2 diabetes mellitus without complications: Secondary | ICD-10-CM

## 2020-07-04 ENCOUNTER — Other Ambulatory Visit: Payer: Self-pay | Admitting: Family Medicine

## 2020-07-25 ENCOUNTER — Other Ambulatory Visit: Payer: Self-pay | Admitting: Family Medicine

## 2020-07-25 DIAGNOSIS — E119 Type 2 diabetes mellitus without complications: Secondary | ICD-10-CM

## 2020-07-25 NOTE — Telephone Encounter (Signed)
Requested medication (s) are due for refill today: no  Requested medication (s) are on the active medication list: yes  Last refill:  04/25/2020  Future visit scheduled: no  Notes to clinic:  patient is overdue for follow up     Requested Prescriptions  Pending Prescriptions Disp Refills   glyBURIDE (DIABETA) 5 MG tablet [Pharmacy Med Name: glyBURIDE 5 MG Oral Tablet] 180 tablet 0    Sig: TAKE 1 TABLET BY MOUTH TWICE DAILY WITH MEALS. NEEDS TO SCHEDULE A FOLLOW UP VISIT.      Endocrinology:  Diabetes - Sulfonylureas Failed - 07/25/2020  5:30 AM      Failed - HBA1C is between 0 and 7.9 and within 180 days    Hgb A1c MFr Bld  Date Value Ref Range Status  08/14/2019 12.0 (H) 4.8 - 5.6 % Final    Comment:             Prediabetes: 5.7 - 6.4          Diabetes: >6.4          Glycemic control for adults with diabetes: <7.0           Failed - Valid encounter within last 6 months    Recent Outpatient Visits           6 months ago [place], [place], PA-C   11 months ago Type 2 diabetes mellitus with diabetic neuropathy, without long-term current use of insulin [facility])   [place], [place], [place]   11 months ago Type 2 diabetes mellitus with diabetic neuropathy, without long-term current use of insulin [facility])   [place], [place], [place]   11 months ago Type 2 diabetes mellitus with diabetic neuropathy, unspecified whether long term insulin use [facility])   [facility] [place], [place], [place]   1 year ago Essential hypertension   [facility], Vickki Muff, [place]       Future Appointments             In 2 months Agbor-Etang, Aaron Edelman, MD [facility], [facility]

## 2020-08-11 ENCOUNTER — Ambulatory Visit
Admission: EM | Admit: 2020-08-11 | Discharge: 2020-08-11 | Disposition: A | Discharge status: Home / Self Care | Payer: 59 | Attending: Emergency Medicine | Admitting: Emergency Medicine

## 2020-08-11 DIAGNOSIS — R05 Cough: Secondary | ICD-10-CM | POA: Diagnosis not present

## 2020-08-11 DIAGNOSIS — Z20822 Contact with and (suspected) exposure to covid-19: Secondary | ICD-10-CM

## 2020-08-11 DIAGNOSIS — R059 Cough, unspecified: Secondary | ICD-10-CM

## 2020-08-11 MED ORDER — BENZONATATE 100 MG PO CAPS: 100.0000 mg | ORAL_CAPSULE | Freq: Three times a day (TID) | ORAL | 0 refills | Status: DC | PRN

## 2020-08-11 NOTE — ED Provider Notes (Signed)
[name]    CSN: [id number] Arrival date & time: 08/11/20  1117      History   Chief Complaint Chief Complaint  Patient presents with  . Cough  . Covid Exposure    HPI Emily Tapia is a 59 y.o. female.   Patient presents with loss of sense of taste and smell x9 days.  She also reports fatigue, diarrhea, and cough with wheezing and tightness.  Two episodes of diarrhea daily.  She denies fever, chills, sore throat, shortness of breath, vomiting, or other symptoms.  Her husband is currently hospitalized with COVID.  Patient has not received the COVID vaccines.  The history is provided by the patient.    Past Medical History:  Diagnosis Date  . Anxiety   . Aortic stenosis   . Diabetes mellitus   . Edema   . Heart murmur   . Hypertension   . Sleep apnea   . Thyroid disease     Patient Active Problem List   Diagnosis Date Noted  . Pelvic mass 07/08/2015  . Hyperlipidemia, mixed 07/08/2015  . Hx of iron deficiency 07/08/2015  . Absolute anemia 04/27/2015  . Diabetic peripheral neuropathy associated with type 2 diabetes mellitus ([place]) 04/27/2015  . Breath shortness 04/27/2015  . Accumulation of fluid in tissues 04/27/2015  . Cardiac murmur 04/27/2015  . Elective abortion 04/27/2015  . HLD (hyperlipidemia) 04/27/2015  . Decreased potassium in the blood 04/27/2015  . Hypothyroidism 04/27/2015  . Obstructive sleep apnea 04/27/2015  . Disorder of peripheral nervous system 04/27/2015  . Obstructive apnea 04/27/2015  . Pain in shoulder 04/27/2015  . Diabetes mellitus, type 2 ([place]) 04/27/2015  . Fibroid uterus 10/25/2013  . Aortic valve stenosis 05/01/2011  . Edema 03/27/2011  . SOB (shortness of breath) 03/27/2011  . Diabetes mellitus ([place]) 03/27/2011  . HTN (hypertension) 03/27/2011  . Essential (primary) hypertension 07/05/2006  . Anxiety, generalized 07/05/2006    Past Surgical History:  Procedure Laterality Date  . ABDOMINAL HYSTERECTOMY   11/17/2013  . BREAST BIOPSY     x2  . CESAREAN SECTION     x 2  . CHOLECYSTECTOMY  2003   DUE TO STONES  . DILATION AND CURETTAGE OF UTERUS    . ELBOW SURGERY    . POLYPECTOMY  1995    OB History    Gravida  3   Para  2   Term      Preterm      AB      Living        SAB      TAB      Ectopic      Multiple      Live Births               Home Medications    Prior to Admission medications   Medication Sig Start Date End Date Taking? Authorizing Provider  aspirin 81 MG EC tablet Take 81 mg by mouth at bedtime.     [provider]  benzonatate (TESSALON) 100 MG capsule Take 1 capsule (100 mg total) by mouth 3 (three) times daily as needed for cough. 08/11/20   Sharion Balloon, NP  Cholecalciferol (VITAMIN D3) 1000 UNITS CAPS Take 1 capsule by mouth every morning.     [provider]  Ferrous Sulfate (IRON) 325 (65 Fe) MG TABS Take 1 tablet by mouth once daily with breakfast 04/24/19   Chrismon, Simona Huh E, PA  furosemide (LASIX) 40 MG  tablet TAKE 1 TABLET BY MOUTH ONCE DAILY AS NEEDED FOR FLUID OR EDEMA 03/28/20   Chrismon, Vickki Muff, PA  Glucosamine 500 MG CAPS Take 500 mg by mouth 2 (two) times daily.    [provider]  glucose blood (CONTOUR NEXT TEST) test strip Test fasting sugar each morning. Recheck if having hypoglycemic symptoms. 10/03/18   Chrismon, Simona Huh E, PA  glyBURIDE (DIABETA) 5 MG tablet TAKE 1 TABLET BY MOUTH TWICE DAILY WITH MEALS. NEEDS TO SCHEDULE A FOLLOW UP VISIT. 07/25/20   Chrismon, Vickki Muff, PA  hydrochlorothiazide (HYDRODIURIL) 25 MG tablet Take 1 tablet (25 mg total) by mouth daily. 03/25/20 06/23/20  [name], MD  levothyroxine (SYNTHROID) 75 MCG tablet TAKE 1 TABLET BY MOUTH ONCE DAILY BEFORE BREAKFAST 08/24/19   Chrismon, Vickki Muff, PA  lisinopril (ZESTRIL) 40 MG tablet Take 1 tablet (40 mg total) by mouth daily. 03/25/20 06/23/20  [name], MD  meclizine (ANTIVERT) 25 MG tablet Take 1 tablet (25 mg total) by  mouth 3 (three) times daily as needed for dizziness. 01/03/20   Harvest Dark, MD  metFORMIN (GLUCOPHAGE) 1000 MG tablet TAKE 1 TABLET BY MOUTH TWICE DAILY WITH MEALS 07/04/20   Chrismon, Vickki Muff, PA  metoprolol tartrate (LOPRESSOR) 25 MG tablet Take 1 tablet (25 mg total) by mouth 3 (three) times daily. 09/14/19   Chrismon, Vickki Muff, PA  Multiple Vitamin (MULTIVITAMIN) tablet Take 1 tablet by mouth every morning.     [provider]  PARoxetine (PAXIL) 20 MG tablet TAKE 1 TABLET BY MOUTH ONCE DAILY. SCHEDULE FOLLOW UP APPOINTMENT AND FASTING LABS IN THE NEXT 1 TO 2 MONTHS. 09/27/19   Chrismon, Vickki Muff, PA    Family History Family History  Problem Relation Age of Onset  . Hyperlipidemia Mother   . Thyroid disease Mother   . Hashimoto's thyroiditis Mother   . Heart disease Father        open heart surgery  . Heart failure Father   . Diabetes Father   . Dementia Father   . Thyroid disease Brother   . Breast cancer Maternal Grandmother   . Diabetes Paternal Grandmother   . Prostate cancer Paternal Grandfather     Social History Social History   Tobacco Use  . Smoking status: Former Smoker    Packs/day: 1.50    Years: 25.00    Pack years: 37.50    Types: Cigarettes    Quit date: 11/24/2001    Years since quitting: 18.7  . Smokeless tobacco: Never Used  Vaping Use  . Vaping Use: Never used  Substance Use Topics  . Alcohol use: Yes    Alcohol/week: 0.0 standard drinks    Comment: OCCASIONALLY DRINKS WINE, ONCE A WEEK  . Drug use: No     Allergies   Patient has no known allergies.   Review of Systems Review of Systems  Constitutional: Positive for fatigue. Negative for chills and fever.  HENT: Negative for ear pain and sore throat.   Eyes: Negative for pain and visual disturbance.  Respiratory: Positive for cough, chest tightness and wheezing. Negative for shortness of breath.   Cardiovascular: Negative for chest pain and palpitations.  Gastrointestinal:  Positive for diarrhea. Negative for abdominal pain, nausea and vomiting.  Genitourinary: Negative for dysuria and hematuria.  Musculoskeletal: Negative for arthralgias and back pain.  Skin: Negative for color change and rash.  Neurological: Negative for seizures and syncope.  All other systems reviewed and are negative.    Physical Exam  Triage Vital Signs ED Triage Vitals  Enc Vitals Group     BP      Pulse      Resp      Temp      Temp src      SpO2      Weight      Height      Head Circumference      Peak Flow      Pain Score      Pain Loc      Pain Edu?      Excl. in [place]?    No data found.  Updated Vital Signs BP (!) 164/76   Pulse 60   Temp 99 F (37.2 C)   Resp 12   SpO2 93%   Visual Acuity Right Eye Distance:   Left Eye Distance:   Bilateral Distance:    Right Eye Near:   Left Eye Near:    Bilateral Near:     Physical Exam Vitals and nursing note reviewed.  Constitutional:      General: She is not in acute distress.    Appearance: She is well-developed.  HENT:     Head: Normocephalic and atraumatic.     Mouth/Throat:     Mouth: Mucous membranes are moist.     Pharynx: Oropharynx is clear.  Eyes:     Conjunctiva/sclera: Conjunctivae normal.  Cardiovascular:     Rate and Rhythm: Normal rate and regular rhythm.     Heart sounds: No murmur heard.   Pulmonary:     Effort: Pulmonary effort is normal. No respiratory distress.     Breath sounds: Normal breath sounds. No wheezing or rhonchi.  Abdominal:     Palpations: Abdomen is soft.     Tenderness: There is no abdominal tenderness. There is no guarding or rebound.  Musculoskeletal:     Cervical back: Neck supple.  Skin:    General: Skin is warm and dry.     Findings: No rash.  Neurological:     General: No focal deficit present.     Mental Status: She is alert and oriented to person, place, and time.     Gait: Gait normal.  Psychiatric:        Mood and Affect: Mood normal.         Behavior: Behavior normal.      UC Treatments / Results  Labs (all labs ordered are listed, but only abnormal results are displayed) Labs Reviewed  NOVEL CORONAVIRUS, NAA    EKG   Radiology No results found.  Procedures Procedures (including critical care time)  Medications Ordered in UC Medications - No data to display  Initial Impression / Assessment and Plan / UC Course  I have reviewed the triage vital signs and the nursing notes.  Pertinent labs & imaging results that were available during my care of the patient were reviewed by me and considered in my medical decision making (see chart for details).   Cough, exposure to COVID.  PCR COVID pending.  Instructed patient to continue to quarantine per CDC guidelines.  Discussed Tylenol or ibuprofen as needed for fever or discomfort, rest, hydration.  Treating cough with Tessalon Perles.  Instructed patient to go to the ED if she has acute worsening symptoms.  Patient agrees to plan of care.   Final Clinical Impressions(s) / UC Diagnoses   Final diagnoses:  Exposure to COVID-19 virus  Cough     Discharge Instructions     Your  COVID test is pending.  You should self quarantine until the test result is back.    Take Tylenol as needed for fever or discomfort.  Rest and keep yourself hydrated.    Go to the emergency department if you develop acute worsening symptoms.        ED Prescriptions    Medication Sig Dispense Auth. Provider   benzonatate (TESSALON) 100 MG capsule Take 1 capsule (100 mg total) by mouth 3 (three) times daily as needed for cough. 21 capsule Sharion Balloon, NP     PDMP not reviewed this encounter.   Sharion Balloon, NP 08/11/20 1201

## 2020-08-11 NOTE — Discharge Instructions (Addendum)
Your COVID test is pending.  You should self quarantine until the test result is back.    Take Tylenol as needed for fever or discomfort.  Rest and keep yourself hydrated.    Go to the emergency department if you develop acute worsening symptoms.     

## 2020-08-11 NOTE — ED Triage Notes (Signed)
Patient reports her husband tested positive for COVID and is currently admitted to [facility] hospital. Patient c/o cough, intermittent chest tightness with wheezing.

## 2020-08-13 LAB — NOVEL CORONAVIRUS, NAA: SARS-CoV-2, NAA: NOT DETECTED

## 2020-08-13 LAB — SARS-COV-2, NAA 2 DAY TAT

## 2020-08-21 ENCOUNTER — Other Ambulatory Visit: Payer: Self-pay | Admitting: Family Medicine

## 2020-08-21 NOTE — Telephone Encounter (Signed)
Requested medication (s) are due for refill today: Yes  Requested medication (s) are on the active medication list: Yes  Last refill:  08/24/19  Future visit scheduled: No  Notes to clinic:  Unable to refill per protocol, expired, failed TSH within 360 days     Requested Prescriptions  Pending Prescriptions Disp Refills   EUTHYROX 75 MCG tablet [Pharmacy Med Name: Euthyrox 75 MCG Oral Tablet] 90 tablet 0    Sig: TAKE 1 TABLET BY MOUTH ONCE DAILY BEFORE BREAKFAST      Endocrinology:  Hypothyroid Agents Failed - 08/21/2020  6:50 PM      Failed - TSH needs to be rechecked within 3 months after an abnormal result. Refill until TSH is due.      Failed - TSH in normal range and within 360 days    TSH  Date Value Ref Range Status  08/14/2019 2.790 0.450 - 4.500 uIU/mL Final          Passed - Valid encounter within last 12 months    Recent Outpatient Visits           7 months ago [place], Fabio Bering M, PA-C   1 year ago Type 2 diabetes mellitus with diabetic neuropathy, without long-term current use of insulin [facility])   [place], [place], [place]   1 year ago Type 2 diabetes mellitus with diabetic neuropathy, without long-term current use of insulin [facility])   [facility] [place], [place], PA-C   1 year ago Type 2 diabetes mellitus with diabetic neuropathy, unspecified whether long term insulin use [facility])   [facility] [place], Wendee [place], [place]   1 year ago Essential hypertension   [place], Vickki Muff, [place]       Future Appointments             In 1 month [place], Aaron Edelman, MD [facility], [facility]

## 2020-09-26 ENCOUNTER — Other Ambulatory Visit: Payer: Self-pay | Admitting: Physician Assistant

## 2020-09-26 ENCOUNTER — Other Ambulatory Visit: Payer: Self-pay | Admitting: Family Medicine

## 2020-09-26 ENCOUNTER — Encounter: Payer: Self-pay | Admitting: Cardiology

## 2020-09-26 ENCOUNTER — Other Ambulatory Visit: Payer: Self-pay

## 2020-09-26 ENCOUNTER — Ambulatory Visit: Payer: 59 | Admitting: Cardiology

## 2020-09-26 VITALS — BP 168/70 | HR 69 | Ht 64.0 in | Wt 188.1 lb

## 2020-09-26 DIAGNOSIS — I350 Nonrheumatic aortic (valve) stenosis: Secondary | ICD-10-CM | POA: Diagnosis not present

## 2020-09-26 DIAGNOSIS — I10 Essential (primary) hypertension: Secondary | ICD-10-CM

## 2020-09-26 DIAGNOSIS — F411 Generalized anxiety disorder: Secondary | ICD-10-CM

## 2020-09-26 MED ORDER — CARVEDILOL 12.5 MG PO TABS: 12.5000 mg | ORAL_TABLET | Freq: Two times a day (BID) | ORAL | 5 refills | Status: DC

## 2020-09-26 NOTE — Telephone Encounter (Signed)
Refill request for Euthyrox last refilled 08/23/20, #30; pharmacy note reads "OFFICE VISIT NEEDED BEFORE NEXT REFILL IS DUE."; attempted to contact pt; left message on voicemail; request refused.

## 2020-09-26 NOTE — Patient Instructions (Signed)
Medication Instructions:  Your physician has recommended you make the following change in your medication:   1.  STOP taking your Lopressor. 2.  START Coreg (Carvedilol) 12.5 MG: Take 1 tablet (12.5 mg total) by mouth 2 (two) times daily.  *If you need a refill on your cardiac medications before your next appointment, please call your pharmacy*   Lab Work: None Ordered If you have labs (blood work) drawn today and your tests are completely normal, you will receive your results only by: Marland Kitchen MyChart Message (if you have MyChart) OR . A paper copy in the mail If you have any lab test that is abnormal or we need to change your treatment, we will call you to review the results.   Testing/Procedures: None Ordered   Follow-Up: At [facility], you and your health needs are our priority.  As part of our continuing mission to provide you with exceptional heart care, we have created designated Provider Care Teams.  These Care Teams include your primary Cardiologist (physician) and Advanced Practice Providers (APPs -  Physician Assistants and Nurse Practitioners) who all work together to provide you with the care you need, when you need it.  We recommend signing up for the patient portal called "MyChart".  Sign up information is provided on this After Visit Summary.  MyChart is used to connect with patients for Virtual Visits (Telemedicine).  Patients are able to view lab/test results, encounter notes, upcoming appointments, etc.  Non-urgent messages can be sent to your provider as well.   To learn more about what you can do with MyChart, go to [email or web address].    Your next appointment:   1 month(s)  The format for your next appointment:   In Person  Provider:   [name], MD   Other Instructions

## 2020-09-26 NOTE — Progress Notes (Signed)
Cardiology Office Note:    Date:  09/26/2020   ID:  Emily Tapia, DOB 1961/01/27, MRN [id number]  PCP:  Margo Common, PA  Cardiologist:  [name], MD  Electrophysiologist:  None   Referring MD: Margo Common, PA   Chief Complaint  Patient presents with  . other    6 month f/u c/o stomach issues bloating and pain. Meds reviewed verbally with pt.    History of Present Illness:    Emily Tapia is a 59 y.o. female with a hx of hypertension, diabetes, moderate aortic stenosis who presents for follow-up.  Patient is being seen due to aortic stenosis and hypertension.  Her blood pressure was elevated after last visit, lisinopril was increased to 40 mg daily.  She recently moved and has not been checking her blood pressures at home.  Also takes HCTZ and Lopressor.  She feels well, has no symptoms at this time.  Denies chest pain or shortness of breath.   Past Medical History:  Diagnosis Date  . Anxiety   . Aortic stenosis   . Diabetes mellitus   . Edema   . Heart murmur   . Hypertension   . Sleep apnea   . Thyroid disease     Past Surgical History:  Procedure Laterality Date  . ABDOMINAL HYSTERECTOMY  11/17/2013  . BREAST BIOPSY     x2  . CESAREAN SECTION     x 2  . CHOLECYSTECTOMY  2003   DUE TO STONES  . DILATION AND CURETTAGE OF UTERUS    . ELBOW SURGERY    . POLYPECTOMY  1995    Current Medications: Current Meds  Medication Sig  . Ascorbic Acid (VITAMIN C PO) Take by mouth daily.  Marland Kitchen aspirin 81 MG EC tablet Take 81 mg by mouth at bedtime.   . Cholecalciferol (VITAMIN D3) 1000 UNITS CAPS Take 1 capsule by mouth every morning.   . EUTHYROX 75 MCG tablet TAKE 1 TABLET BY MOUTH ONCE DAILY BEFORE BREAKFAST  . Ferrous Sulfate (IRON) 325 (65 Fe) MG TABS Take 1 tablet by mouth once daily with breakfast  . furosemide (LASIX) 40 MG tablet TAKE 1 TABLET BY MOUTH ONCE DAILY AS NEEDED FOR FLUID OR EDEMA  . Glucosamine 500 MG CAPS Take 500 mg  by mouth 2 (two) times daily.  Marland Kitchen glucose blood (CONTOUR NEXT TEST) test strip Test fasting sugar each morning. Recheck if having hypoglycemic symptoms.  Marland Kitchen glyBURIDE (DIABETA) 5 MG tablet TAKE 1 TABLET BY MOUTH TWICE DAILY WITH MEALS. NEEDS TO SCHEDULE A FOLLOW UP VISIT.  . hydrochlorothiazide (HYDRODIURIL) 25 MG tablet Take 1 tablet (25 mg total) by mouth daily.  Marland Kitchen lisinopril (ZESTRIL) 40 MG tablet Take 1 tablet (40 mg total) by mouth daily.  . meclizine (ANTIVERT) 25 MG tablet Take 1 tablet (25 mg total) by mouth 3 (three) times daily as needed for dizziness.  . metFORMIN (GLUCOPHAGE) 1000 MG tablet TAKE 1 TABLET BY MOUTH TWICE DAILY WITH MEALS  . Multiple Vitamin (MULTIVITAMIN) tablet Take 1 tablet by mouth every morning.   . Multiple Vitamins-Minerals (ZINC PO) Take by mouth daily.  Marland Kitchen PARoxetine (PAXIL) 20 MG tablet TAKE 1 TABLET BY MOUTH ONCE DAILY. SCHEDULE FOLLOW UP APPOINTMENT AND FASTING LABS IN THE NEXT 1 TO 2 MONTHS.  . [DISCONTINUED] metoprolol tartrate (LOPRESSOR) 25 MG tablet Take 1 tablet (25 mg total) by mouth 3 (three) times daily.     Allergies:   Patient has no known allergies.  Social History   Socioeconomic History  . Marital status: Married    Spouse name: [name]  . Number of children: 2  . Years of education: 57  . Highest education level: Not on file  Occupational History  . Occupation: [employer]  Tobacco Use  . Smoking status: Former Smoker    Packs/day: 1.50    Years: 25.00    Pack years: 37.50    Types: Cigarettes    Quit date: 11/24/2001    Years since quitting: 18.8  . Smokeless tobacco: Never Used  Vaping Use  . Vaping Use: Never used  Substance and Sexual Activity  . Alcohol use: Yes    Alcohol/week: 0.0 standard drinks    Comment: OCCASIONALLY DRINKS WINE, ONCE A WEEK  . Drug use: No  . Sexual activity: Not on file  Other Topics Concern  . Not on file  Social History Narrative  . Not on file   Social Determinants of Health   Financial  Resource Strain:   . Difficulty of Paying Living Expenses: Not on file  Food Insecurity:   . Worried About Charity fundraiser in the Last Year: Not on file  . Ran Out of Food in the Last Year: Not on file  Transportation Needs:   . Lack of Transportation (Medical): Not on file  . Lack of Transportation (Non-Medical): Not on file  Physical Activity:   . Days of Exercise per Week: Not on file  . Minutes of Exercise per Session: Not on file  Stress:   . Feeling of Stress : Not on file  Social Connections:   . Frequency of Communication with Friends and Family: Not on file  . Frequency of Social Gatherings with Friends and Family: Not on file  . Attends Religious Services: Not on file  . Active Member of Clubs or Organizations: Not on file  . Attends Archivist Meetings: Not on file  . Marital Status: Not on file     Family History: The patient's family history includes Breast cancer in her maternal grandmother; Dementia in her father; Diabetes in her father and paternal grandmother; Hashimoto's thyroiditis in her mother; Heart disease in her father; Heart failure in her father; Hyperlipidemia in her mother; Prostate cancer in her paternal grandfather; Thyroid disease in her brother and mother.  ROS:   Please see the history of present illness.     All other systems reviewed and are negative.  EKGs/Labs/Other Studies Reviewed:    The following studies were reviewed today: Echocardiogram date 11/02/2016  Left ventricle: The cavity size was normal. There was mild   concentric hypertrophy. Systolic function was normal. The   estimated ejection fraction was in the range of 60% to 65%. Wall   motion was normal; there were no regional wall motion   abnormalities. Left ventricular diastolic function parameters   were normal. - Aortic valve: Transvalvular velocity was increased. There was   moderate stenosis. Peak velocity (S): 380 cm/s. Mean gradient   (S): 30 mm Hg. Peak  gradient (S): 60 mm Hg. Valve area (VTI):   1.13 cm^2. - Aortic root: The aortic root was normal in size. - Right ventricle: Systolic function was normal. - Pulmonary arteries: Systolic pressure was within the normal   range.  EKG:  EKG is  ordered today.  The ekg ordered today demonstrates normal sinus rhythm, QT prolongation, slightly reduced from prior.  Recent Labs: 01/02/2020: BUN 15; Creatinine, Ser 0.72; Hemoglobin 13.1; Platelets 137; Potassium 3.4; Sodium  138  Recent Lipid Panel    Component Value Date/Time   CHOL 151 08/14/2019 0920   TRIG 187 (H) 08/14/2019 0920   HDL 29 (L) 08/14/2019 0920   CHOLHDL 5.2 (H) 08/14/2019 0920   LDLCALC 85 08/14/2019 0920    Physical Exam:    VS:  BP (!) 168/70 (BP Location: Left Arm, Patient Position: Sitting, Cuff Size: Normal)   Pulse 69   Ht 5\' 4"  (1.626 m)   Wt 188 lb 2 oz (85.3 kg)   SpO2 98%   BMI 32.29 kg/m     Wt Readings from Last 3 Encounters:  09/26/20 188 lb 2 oz (85.3 kg)  03/25/20 195 lb 6 oz (88.6 kg)  01/08/20 197 lb (89.4 kg)     GEN:  Well nourished, well developed in no acute distress HEENT: Normal NECK: No JVD; No carotid bruits LYMPHATICS: No lymphadenopathy CARDIAC: RRR, 3/6 systolic murmur right upper sternal border, no rubs, no gallops RESPIRATORY:  Clear to auscultation without rales, wheezing or rhonchi  ABDOMEN: Soft, non-tender, non-distended MUSCULOSKELETAL:  No edema; No deformity  SKIN: Warm and dry NEUROLOGIC:  Alert and oriented x 3 PSYCHIATRIC:  Normal affect   ASSESSMENT:    1. Aortic valve stenosis, etiology of cardiac valve disease unspecified   2. Essential hypertension    PLAN:    In order of problems listed above:  1. history of moderate aortic stenosis. echocardiogram on 02/2020 showed normal ejection fraction with EF 76-54, grade 1 diastolic dysfunction, moderate aortic stenosis.  Plan for serial monitoring with echocardiograms every 1 to 3 years as per [facility] AHA guidelines.   Next echo will be planned around 02/2021. 2. History of hypertension, blood pressure elevated.  Stop Lopressor, start Coreg 12.5 mg twice daily.  Continue lisinopril and HCTZ.  If BP elevated at follow-up visit, consider increasing Coreg to 25 mg twice daily.  Patient advised to check blood pressures frequently at home and keep a log.   Follow-up in 1 month for BP.   This note was generated in part or whole with voice recognition software. Voice recognition is usually quite accurate but there are transcription errors that can and very often do occur. I apologize for any typographical errors that were not detected and corrected.  Medication Adjustments/Labs and Tests Ordered: Current medicines are reviewed at length with the patient today.  Concerns regarding medicines are outlined above.  Orders Placed This Encounter  Procedures  . EKG 12-Lead   Meds ordered this encounter  Medications  . carvedilol (COREG) 12.5 MG tablet    Sig: Take 1 tablet (12.5 mg total) by mouth 2 (two) times daily.    Dispense:  60 tablet    Refill:  5    Patient Instructions  Medication Instructions:  Your physician has recommended you make the following change in your medication:   1.  STOP taking your Lopressor. 2.  START Coreg (Carvedilol) 12.5 MG: Take 1 tablet (12.5 mg total) by mouth 2 (two) times daily.  *If you need a refill on your cardiac medications before your next appointment, please call your pharmacy*   Lab Work: None Ordered If you have labs (blood work) drawn today and your tests are completely normal, you will receive your results only by: Marland Kitchen MyChart Message (if you have MyChart) OR . A paper copy in the mail If you have any lab test that is abnormal or we need to change your treatment, we will call you to review the results.  Testing/Procedures: None Ordered   Follow-Up: At [facility], you and your health needs are our priority.  As part of our continuing mission to provide  you with exceptional heart care, we have created designated Provider Care Teams.  These Care Teams include your primary Cardiologist (physician) and Advanced Practice Providers (APPs -  Physician Assistants and Nurse Practitioners) who all work together to provide you with the care you need, when you need it.  We recommend signing up for the patient portal called "MyChart".  Sign up information is provided on this After Visit Summary.  MyChart is used to connect with patients for Virtual Visits (Telemedicine).  Patients are able to view lab/test results, encounter notes, upcoming appointments, etc.  Non-urgent messages can be sent to your provider as well.   To learn more about what you can do with MyChart, go to [email or web address].    Your next appointment:   1 month(s)  The format for your next appointment:   In Person  Provider:   [name], MD   Other Instructions      Signed, [name], MD  09/26/2020 4:54 PM    [place]

## 2020-09-27 ENCOUNTER — Other Ambulatory Visit: Payer: Self-pay | Admitting: Family Medicine

## 2020-09-27 DIAGNOSIS — F411 Generalized anxiety disorder: Secondary | ICD-10-CM

## 2020-09-28 ENCOUNTER — Other Ambulatory Visit: Payer: Self-pay | Admitting: Physician Assistant

## 2020-09-28 NOTE — Telephone Encounter (Signed)
Office visit has been scheduled-10/14/20 as requested. Rx granted

## 2020-10-02 ENCOUNTER — Other Ambulatory Visit: Payer: Self-pay | Admitting: Family Medicine

## 2020-10-14 ENCOUNTER — Ambulatory Visit (INDEPENDENT_AMBULATORY_CARE_PROVIDER_SITE_OTHER): Payer: 59 | Admitting: Family Medicine

## 2020-10-14 ENCOUNTER — Encounter: Payer: Self-pay | Admitting: Family Medicine

## 2020-10-14 ENCOUNTER — Other Ambulatory Visit: Payer: Self-pay

## 2020-10-14 VITALS — BP 131/59 | HR 74 | Temp 98.5°F | Wt 188.0 lb

## 2020-10-14 DIAGNOSIS — I350 Nonrheumatic aortic (valve) stenosis: Secondary | ICD-10-CM

## 2020-10-14 DIAGNOSIS — E782 Mixed hyperlipidemia: Secondary | ICD-10-CM

## 2020-10-14 DIAGNOSIS — I10 Essential (primary) hypertension: Secondary | ICD-10-CM | POA: Diagnosis not present

## 2020-10-14 DIAGNOSIS — E039 Hypothyroidism, unspecified: Secondary | ICD-10-CM | POA: Diagnosis not present

## 2020-10-14 DIAGNOSIS — E119 Type 2 diabetes mellitus without complications: Secondary | ICD-10-CM | POA: Diagnosis not present

## 2020-10-14 NOTE — Progress Notes (Signed)
Established patient visit   Patient: Emily Tapia   DOB: 08/07/1961   59 y.o. Female  MRN: [id number] Visit Date: 10/14/2020  Today's healthcare provider: Vernie Murders, PA   Chief Complaint  Patient presents with  . Diabetes  . Hyperlipidemia  . Hypertension  . Hypothyroidism   Subjective    HPI  Hypertension, follow-up  BP Readings from Last 3 Encounters:  10/14/20 (!) 131/59  09/26/20 (!) 168/70  08/11/20 (!) 164/76   Wt Readings from Last 3 Encounters:  10/14/20 188 lb (85.3 kg)  09/26/20 188 lb 2 oz (85.3 kg)  03/25/20 195 lb 6 oz (88.6 kg)     She was last seen for hypertension several months ago.  Management since that visit includes having her cardiologist increase some of her medications.  Changes are reflected on her medication list.   She reports good compliance with treatment. She is not having side effects.   Outside blood pressures have been elevated.  Symptoms: No chest pain No chest pressure  Yes palpitations No syncope  No dyspnea No orthopnea  No paroxysmal nocturnal dyspnea Yes lower extremity edema   Pertinent labs: Lab Results  Component Value Date   CHOL 151 08/14/2019   HDL 29 (L) 08/14/2019   LDLCALC 85 08/14/2019   TRIG 187 (H) 08/14/2019   CHOLHDL 5.2 (H) 08/14/2019   Lab Results  Component Value Date   NA 138 01/02/2020   K 3.4 (L) 01/02/2020   CREATININE 0.72 01/02/2020   GFRNONAA >60 01/02/2020   GFRAA >60 01/02/2020   GLUCOSE 192 (H) 01/02/2020     The 10-year ASCVD risk score Mikey Bussing DC Jr., et al., 2013) is: 10.8%   --------------------------------------------------------------------------------------------------- Lipid/Cholesterol, Follow-up  Last lipid panel Other pertinent labs  Lab Results  Component Value Date   CHOL 151 08/14/2019   HDL 29 (L) 08/14/2019   LDLCALC 85 08/14/2019   TRIG 187 (H) 08/14/2019   CHOLHDL 5.2 (H) 08/14/2019   Lab Results  Component Value Date   ALT 37 (H)  08/14/2019   AST 28 08/14/2019   PLT 137 (L) 01/02/2020   TSH 2.790 08/14/2019     She was last seen for this several months ago.   Management since that visit includes none.  She reports good compliance with treatment. She is not having side effects.   Symptoms: No chest pain No chest pressure/discomfort  No dyspnea Yes lower extremity edema  Yes numbness or tingling of extremity No orthopnea  Yes palpitations No paroxysmal nocturnal dyspnea  No speech difficulty No syncope     The 10-year ASCVD risk score Mikey Bussing DC Jr., et al., 2013) is: 10.8%  --------------------------------------------------------------------------------------------------- Diabetes Mellitus Type II, Follow-up  Lab Results  Component Value Date   HGBA1C 12.0 (H) 08/14/2019   HGBA1C 9.5 (H) 10/03/2018   HGBA1C 7.7 (H) 04/21/2018   Wt Readings from Last 3 Encounters:  10/14/20 188 lb (85.3 kg)  09/26/20 188 lb 2 oz (85.3 kg)  03/25/20 195 lb 6 oz (88.6 kg)   Last seen for diabetes several months ago.[place].  Management since then includes none. She reports good compliance with treatment. She is not having side effects.  Symptoms: No fatigue No foot ulcerations  No appetite changes No nausea  No paresthesia of the feet  No polydipsia  No polyuria No visual disturbances   No vomiting     Home blood sugar records: Are not being checked.  Episodes of hypoglycemia? No  Current insulin regiment: None  Most Recent Eye Exam: more than 1 year ago.   Pertinent Labs: Lab Results  Component Value Date   CHOL 151 08/14/2019   HDL 29 (L) 08/14/2019   LDLCALC 85 08/14/2019   TRIG 187 (H) 08/14/2019   CHOLHDL 5.2 (H) 08/14/2019   Lab Results  Component Value Date   NA 138 01/02/2020   K 3.4 (L) 01/02/2020   CREATININE 0.72 01/02/2020   GFRNONAA >60 01/02/2020   GFRAA >60 01/02/2020   GLUCOSE 192 (H) 01/02/2020      --------------------------------------------------------------------------------------------------- Hypothyroid, follow-up  Lab Results  Component Value Date   TSH 2.790 08/14/2019   TSH 4.070 10/03/2018   TSH 3.230 12/24/2017   T4TOTAL 12.7 (H) 08/14/2019   T4TOTAL 10.8 10/03/2018   Wt Readings from Last 3 Encounters:  10/14/20 188 lb (85.3 kg)  09/26/20 188 lb 2 oz (85.3 kg)  03/25/20 195 lb 6 oz (88.6 kg)    She was last seen for hypothyroid several months ago. Management since that visit includes none. She reports good compliance with treatment. She is not having side effects.   Symptoms: No change in energy level No constipation  No diarrhea Yes heat / cold intolerance  No nervousness Yes palpitations  No weight changes    -----------------------------------------------------------------------------------------  Past Medical History:  Diagnosis Date  . Anxiety   . Aortic stenosis   . Diabetes mellitus   . Edema   . Heart murmur   . Hypertension   . Sleep apnea   . Thyroid disease    Past Surgical History:  Procedure Laterality Date  . ABDOMINAL HYSTERECTOMY  11/17/2013  . BREAST BIOPSY     x2  . CESAREAN SECTION     x 2  . CHOLECYSTECTOMY  2003   DUE TO STONES  . DILATION AND CURETTAGE OF UTERUS    . ELBOW SURGERY    . POLYPECTOMY  1995   Family History  Problem Relation Age of Onset  . Hyperlipidemia Mother   . Thyroid disease Mother   . Hashimoto's thyroiditis Mother   . Heart disease Father        open heart surgery  . Heart failure Father   . Diabetes Father   . Dementia Father   . Thyroid disease Brother   . Breast cancer Maternal Grandmother   . Diabetes Paternal Grandmother   . Prostate cancer Paternal Grandfather    Social History   Tobacco Use  . Smoking status: Former Smoker    Packs/day: 1.50    Years: 25.00    Pack years: 37.50    Types: Cigarettes    Quit date: 11/24/2001    Years since quitting: 18.9  . Smokeless  tobacco: Never Used  Vaping Use  . Vaping Use: Never used  Substance Use Topics  . Alcohol use: Yes    Alcohol/week: 0.0 standard drinks    Comment: OCCASIONALLY DRINKS WINE, ONCE A WEEK  . Drug use: No   No Known Allergies    Medications: Outpatient Medications Prior to Visit  Medication Sig  . Ascorbic Acid (VITAMIN C PO) Take by mouth daily.  Marland Kitchen aspirin 81 MG EC tablet Take 81 mg by mouth at bedtime.   . carvedilol (COREG) 12.5 MG tablet Take 1 tablet (12.5 mg total) by mouth 2 (two) times daily.  . Cholecalciferol (VITAMIN D3) 1000 UNITS CAPS Take 1 capsule by mouth every morning.   . EUTHYROX 75 MCG tablet TAKE 1 TABLET BY MOUTH  ONCE DAILY BEFORE BREAKFAST. OFFICE VISIT NEEDED BEFORE NEXT REFILL IS DUE.  Marland Kitchen Ferrous Sulfate (IRON) 325 (65 Fe) MG TABS Take 1 tablet by mouth once daily with breakfast  . furosemide (LASIX) 40 MG tablet TAKE 1 TABLET BY MOUTH ONCE DAILY AS NEEDED FOR FLUID OR EDEMA  . Glucosamine 500 MG CAPS Take 500 mg by mouth 2 (two) times daily.  Marland Kitchen glucose blood (CONTOUR NEXT TEST) test strip Test fasting sugar each morning. Recheck if having hypoglycemic symptoms.  Marland Kitchen glyBURIDE (DIABETA) 5 MG tablet TAKE 1 TABLET BY MOUTH TWICE DAILY WITH MEALS. NEEDS TO SCHEDULE A FOLLOW UP VISIT.  Marland Kitchen meclizine (ANTIVERT) 25 MG tablet Take 1 tablet (25 mg total) by mouth 3 (three) times daily as needed for dizziness.  . metFORMIN (GLUCOPHAGE) 1000 MG tablet TAKE 1 TABLET BY MOUTH TWICE DAILY WITH MEALS  . Multiple Vitamin (MULTIVITAMIN) tablet Take 1 tablet by mouth every morning.   . Multiple Vitamins-Minerals (ZINC PO) Take by mouth daily.  Marland Kitchen PARoxetine (PAXIL) 20 MG tablet TAKE 1 TABLET BY MOUTH ONCE DAILY. SCHEDULE FOLLOW UP APPOINTMENT AND FASTING LABS IN THE NEXT 1 TO 2 MONTHS.  . hydrochlorothiazide (HYDRODIURIL) 25 MG tablet Take 1 tablet (25 mg total) by mouth daily.  Marland Kitchen lisinopril (ZESTRIL) 40 MG tablet Take 1 tablet (40 mg total) by mouth daily.   No facility-administered  medications prior to visit.    Review of Systems  Constitutional: Negative.   HENT: Negative.   Respiratory: Negative.   Cardiovascular: Negative.   Gastrointestinal: Negative.   Musculoskeletal: Negative.        Objective    BP (!) 131/59 (BP Location: Right Arm, Patient Position: Sitting, Cuff Size: Normal)   Pulse 74   Temp 98.5 F (36.9 C) (Oral)   Wt 188 lb (85.3 kg)   SpO2 95%   BMI 32.27 kg/m     Physical Exam Constitutional:      General: She is not in acute distress.    Appearance: She is well-developed.  HENT:     Head: Normocephalic and atraumatic.     Right Ear: Hearing normal.     Left Ear: Hearing normal.     Nose: Nose normal.  Eyes:     General: Lids are normal. No scleral icterus.       Right eye: No discharge.        Left eye: No discharge.     Conjunctiva/sclera: Conjunctivae normal.  Cardiovascular:     Rate and Rhythm: Normal rate and regular rhythm.     Heart sounds: Murmur heard.      Comments: Grade 1-0/9 holosystolic murmur bilateral sternal borders. Pulmonary:     Effort: Pulmonary effort is normal. No respiratory distress.  Musculoskeletal:        General: Normal range of motion.     Cervical back: Neck supple.  Skin:    Findings: No lesion or rash.  Neurological:     Mental Status: She is alert and oriented to person, place, and time.  Psychiatric:        Speech: Speech normal.        Behavior: Behavior normal.        Thought Content: Thought content normal.       No results found for any visits on 10/14/20.  Assessment & Plan     1. Essential (primary) hypertension Well controlled BP on the Lisinopril, HCTZ and Coreg prescribed by her cardiologist. Recheck labs and continue cardiology follow up with  Dr. [name]. - CBC with Differential/Platelet - Comprehensive metabolic panel - Lipid Panel With LDL/HDL Ratio - TSH  2. Type 2 diabetes mellitus without complication, without long-term current use of insulin  ([place]) On 08-14-19, Hgb A1C was over 12. Still taking Metformin 1000 mg BID and Glyburide 5 mg BID. Stopped the Trulicity last year due to cost. Will recheck labs and continue to follow FBS at home. Encouraged to see ophthalmologist annually. - CBC with Differential/Platelet - Comprehensive metabolic panel - Hemoglobin A1c - Lipid Panel With LDL/HDL Ratio - Urine Microalbumin w/creat. ratio  3. Hypothyroidism, unspecified type Tolerating Euthyrox 75 mcg qd. Recheck labs. - CBC with Differential/Platelet - Comprehensive metabolic panel - TSH  4. Mixed hyperlipidemia Elevated triglycerides and low HDL in 2020. Declined statin. Recheck labs. - CBC with Differential/Platelet - Comprehensive metabolic panel - Lipid Panel With LDL/HDL Ratio - TSH  5. Aortic valve stenosis, etiology of cardiac valve disease unspecified Asymptomatic. Followed by cardiologist (Dr. [name]).   No follow-ups on file.      Andres Shad, PA, have reviewed all documentation for this visit. The documentation on 10/14/20 for the exam, diagnosis, procedures, and orders are all accurate and complete.    Vernie Murders, [place] [phone] (phone) [phone] (fax)  [place]

## 2020-10-15 LAB — LIPID PANEL WITH LDL/HDL RATIO
Cholesterol, Total: 183 mg/dL (ref 100–199)
HDL: 33 mg/dL — ABNORMAL LOW (ref >39)
LDL Chol Calc (NIH): 110 mg/dL — ABNORMAL HIGH (ref 0–99)
LDL/HDL Ratio: 3.3 ratio — ABNORMAL HIGH (ref 0.0–3.2)
Triglycerides: 229 mg/dL — ABNORMAL HIGH (ref 0–149)
VLDL Cholesterol Cal: 40 mg/dL (ref 5–40)

## 2020-10-15 LAB — COMPREHENSIVE METABOLIC PANEL
ALT: 44 IU/L — ABNORMAL HIGH (ref 0–32)
AST: 39 IU/L (ref 0–40)
Albumin/Globulin Ratio: 1.9 (ref 1.2–2.2)
Albumin: 4.6 g/dL (ref 3.8–4.9)
Alkaline Phosphatase: 85 IU/L (ref 44–121)
BUN/Creatinine Ratio: 24 (ref 12–28)
BUN: 16 mg/dL (ref 8–27)
Bilirubin Total: 0.6 mg/dL (ref 0.0–1.2)
CO2: 25 mmol/L (ref 20–29)
Calcium: 9.8 mg/dL (ref 8.7–10.3)
Chloride: 93 mmol/L — ABNORMAL LOW (ref 96–106)
Creatinine, Ser: 0.66 mg/dL (ref 0.57–1.00)
GFR calc Af Amer: 111 mL/min/{1.73_m2} (ref >59)
GFR calc non Af Amer: 96 mL/min/{1.73_m2} (ref >59)
Globulin, Total: 2.4 g/dL (ref 1.5–4.5)
Glucose: 391 mg/dL — ABNORMAL HIGH (ref 65–99)
Potassium: 3.8 mmol/L (ref 3.5–5.2)
Sodium: 137 mmol/L (ref 134–144)
Total Protein: 7 g/dL (ref 6.0–8.5)

## 2020-10-15 LAB — CBC WITH DIFFERENTIAL/PLATELET
Basophils Absolute: 0 10*3/uL (ref 0.0–0.2)
Basos: 1 %
EOS (ABSOLUTE): 0.1 10*3/uL (ref 0.0–0.4)
Eos: 2 %
Hematocrit: 42.2 % (ref 34.0–46.6)
Hemoglobin: 13.1 g/dL (ref 11.1–15.9)
Immature Grans (Abs): 0 10*3/uL (ref 0.0–0.1)
Immature Granulocytes: 0 %
Lymphocytes Absolute: 1.7 10*3/uL (ref 0.7–3.1)
Lymphs: 33 %
MCH: 25.5 pg — ABNORMAL LOW (ref 26.6–33.0)
MCHC: 31 g/dL — ABNORMAL LOW (ref 31.5–35.7)
MCV: 82 fL (ref 79–97)
Monocytes Absolute: 0.3 10*3/uL (ref 0.1–0.9)
Monocytes: 6 %
Neutrophils Absolute: 3 10*3/uL (ref 1.4–7.0)
Neutrophils: 58 %
Platelets: 137 10*3/uL — ABNORMAL LOW (ref 150–450)
RBC: 5.13 x10E6/uL (ref 3.77–5.28)
RDW: 14.8 % (ref 11.7–15.4)
WBC: 5.1 10*3/uL (ref 3.4–10.8)

## 2020-10-15 LAB — HEMOGLOBIN A1C
Est. average glucose Bld gHb Est-mCnc: 318 mg/dL
Hgb A1c MFr Bld: 12.7 % — ABNORMAL HIGH (ref 4.8–5.6)

## 2020-10-15 LAB — MICROALBUMIN / CREATININE URINE RATIO
Creatinine, Urine: 128.2 mg/dL
Microalb/Creat Ratio: 21 mg/g creat (ref 0–29)
Microalbumin, Urine: 26.9 ug/mL

## 2020-10-15 LAB — TSH: TSH: 3.03 u[IU]/mL (ref 0.450–4.500)

## 2020-10-18 ENCOUNTER — Other Ambulatory Visit: Payer: Self-pay | Admitting: Family Medicine

## 2020-10-18 ENCOUNTER — Other Ambulatory Visit: Payer: Self-pay

## 2020-10-18 NOTE — Telephone Encounter (Signed)
Sample left up front --patient is aware and her spouse will pick it up.

## 2020-10-18 NOTE — Telephone Encounter (Signed)
Found this is not in the formulary for her insurance. Januvia is in the formulary and we have some samples to try for a couple weeks.

## 2020-10-23 ENCOUNTER — Other Ambulatory Visit: Payer: Self-pay | Admitting: Physician Assistant

## 2020-10-23 ENCOUNTER — Other Ambulatory Visit: Payer: Self-pay | Admitting: Family Medicine

## 2020-10-23 DIAGNOSIS — E119 Type 2 diabetes mellitus without complications: Secondary | ICD-10-CM

## 2020-10-23 NOTE — Telephone Encounter (Signed)
Requested Prescriptions  Pending Prescriptions Disp Refills   EUTHYROX 75 MCG tablet [Pharmacy Med Name: Euthyrox 75 MCG Oral Tablet] 90 tablet 3    Sig: TAKE 1 TABLET BY MOUTH ONCE DAILY BEFORE BREAKFAST .OFFICE  VIST  NEEDED  BEFORE  NEXT  REFILL  IS  DUE     Endocrinology:  Hypothyroid Agents Failed - 10/23/2020  5:34 PM      Failed - TSH needs to be rechecked within 3 months after an abnormal result. Refill until TSH is due.      Passed - TSH in normal range and within 360 days    TSH  Date Value Ref Range Status  10/14/2020 3.030 0.450 - 4.500 uIU/mL Final         Passed - Valid encounter within last 12 months    Recent Outpatient Visits          1 week ago Essential (primary) hypertension   [facility], Vickki Muff, [place]   9 months ago Welch Pollak, [place], PA-C   1 year ago Type 2 diabetes mellitus with diabetic neuropathy, without long-term current use of insulin [facility])   [facility], [place], [place]   1 year ago Type 2 diabetes mellitus with diabetic neuropathy, without long-term current use of insulin [facility])   [facility] [place], [place], PA-C   1 year ago Type 2 diabetes mellitus with diabetic neuropathy, unspecified whether long term insulin use [facility])   [place], Wendee Beavers, [place]      Future Appointments            In 4 days [place], Aaron Edelman, MD [facility]

## 2020-10-27 ENCOUNTER — Ambulatory Visit (INDEPENDENT_AMBULATORY_CARE_PROVIDER_SITE_OTHER): Payer: 59 | Admitting: Cardiology

## 2020-10-27 ENCOUNTER — Telehealth: Payer: Self-pay | Admitting: Family Medicine

## 2020-10-27 ENCOUNTER — Other Ambulatory Visit: Payer: Self-pay

## 2020-10-27 ENCOUNTER — Encounter: Payer: Self-pay | Admitting: Cardiology

## 2020-10-27 VITALS — BP 130/80 | HR 73 | Ht 64.0 in | Wt 192.0 lb

## 2020-10-27 DIAGNOSIS — I350 Nonrheumatic aortic (valve) stenosis: Secondary | ICD-10-CM | POA: Diagnosis not present

## 2020-10-27 DIAGNOSIS — I10 Essential (primary) hypertension: Secondary | ICD-10-CM

## 2020-10-27 NOTE — Telephone Encounter (Signed)
Patient is calling to state that Simona Huh gave her samples of Juniva. However, the paitent is unable to afford the [place]. Patient is calling because she found a company that can give give a medication savings plan for trulcity. Patient would like to know is she a candidate for using trulcility? Can she receive some samples to try it out?  Please advise Cb- [phone]

## 2020-10-27 NOTE — Progress Notes (Signed)
Cardiology Office Note:    Date:  10/27/2020   ID:  Emily Tapia, DOB [birth year], MRN [id number]  PCP:  Emily Common, PA  Cardiologist:  Emily Sable, MD  Electrophysiologist:  None   Referring MD: Emily Common, PA   Chief Complaint  Patient presents with  . Follow-up    1 month  Pt states no new Sx.    History of Present Illness:    Emily Tapia is a 59 y.o. female with a hx of hypertension, diabetes, moderate aortic stenosis who presents for follow-up.  Last seen for hypertension, medication titration was performed starting Coreg and stopping Lopressor.  HCTZ and lisinopril were continued.  She is compliant with her medications as prescribed.  Her blood pressure readings at home have been elevated roughly in the 140s to 160s.  Saw her primary care provider about 2 weeks ago where blood pressures were better controlled with systolic in the 664Q.  Prior notes Echo 0/3474 normal systolic function, EF 60 to 65%, impaired relaxation, moderate aortic stenosis.  Past Medical History:  Diagnosis Date  . Anxiety   . Aortic stenosis   . Diabetes mellitus   . Edema   . Heart murmur   . Hypertension   . Sleep apnea   . Thyroid disease     Past Surgical History:  Procedure Laterality Date  . ABDOMINAL HYSTERECTOMY  11/17/2013  . BREAST BIOPSY     x2  . CESAREAN SECTION     x 2  . CHOLECYSTECTOMY  2003   DUE TO STONES  . DILATION AND CURETTAGE OF UTERUS    . ELBOW SURGERY    . POLYPECTOMY  1995    Current Medications: Current Meds  Medication Sig  . Ascorbic Acid (VITAMIN C PO) Take by mouth daily.  Marland Kitchen aspirin 81 MG EC tablet Take 81 mg by mouth at bedtime.   . carvedilol (COREG) 12.5 MG tablet Take 1 tablet (12.5 mg total) by mouth 2 (two) times daily.  . Cholecalciferol (VITAMIN D3) 1000 UNITS CAPS Take 1 capsule by mouth every morning.   . EUTHYROX 75 MCG tablet TAKE 1 TABLET BY MOUTH ONCE DAILY BEFORE BREAKFAST .OFFICE  VIST  NEEDED   BEFORE  NEXT  REFILL  IS  DUE  . Ferrous Sulfate (IRON) 325 (65 Fe) MG TABS Take 1 tablet by mouth once daily with breakfast  . furosemide (LASIX) 40 MG tablet TAKE 1 TABLET BY MOUTH ONCE DAILY AS NEEDED FOR FLUID OR EDEMA  . Glucosamine 500 MG CAPS Take 500 mg by mouth 2 (two) times daily.  Marland Kitchen glucose blood (CONTOUR NEXT TEST) test strip Test fasting sugar each morning. Recheck if having hypoglycemic symptoms.  Marland Kitchen glyBURIDE (DIABETA) 5 MG tablet TAKE 1 TABLET BY MOUTH TWICE DAILY WITH MEALS. NEEDS TO SCHEDULE A FOLLOW UP VISIT.  Marland Kitchen meclizine (ANTIVERT) 25 MG tablet Take 1 tablet (25 mg total) by mouth 3 (three) times daily as needed for dizziness.  . metFORMIN (GLUCOPHAGE) 1000 MG tablet TAKE 1 TABLET BY MOUTH TWICE DAILY WITH MEALS  . Multiple Vitamin (MULTIVITAMIN) tablet Take 1 tablet by mouth every morning.   . Multiple Vitamins-Minerals (ZINC PO) Take by mouth daily.  Marland Kitchen PARoxetine (PAXIL) 20 MG tablet TAKE 1 TABLET BY MOUTH ONCE DAILY. SCHEDULE FOLLOW UP APPOINTMENT AND FASTING LABS IN THE NEXT 1 TO 2 MONTHS.     Allergies:   Patient has no known allergies.   Social History   Socioeconomic History  .  Marital status: Married    Spouse name: Emily Tapia  . Number of children: 2  . Years of education: 75  . Highest education level: Not on file  Occupational History  . Occupation: [employer]  Tobacco Use  . Smoking status: Former Smoker    Packs/day: 1.50    Years: 25.00    Pack years: 37.50    Types: Cigarettes    Quit date: 11/24/2001    Years since quitting: 18.9  . Smokeless tobacco: Never Used  Vaping Use  . Vaping Use: Never used  Substance and Sexual Activity  . Alcohol use: Yes    Alcohol/week: 0.0 standard drinks    Comment: OCCASIONALLY DRINKS WINE, ONCE A WEEK  . Drug use: No  . Sexual activity: Not on file  Other Topics Concern  . Not on file  Social History Narrative  . Not on file   Social Determinants of Health   Financial Resource Strain:   . Difficulty of  Paying Living Expenses: Not on file  Food Insecurity:   . Worried About Charity fundraiser in the Last Year: Not on file  . Ran Out of Food in the Last Year: Not on file  Transportation Needs:   . Lack of Transportation (Medical): Not on file  . Lack of Transportation (Non-Medical): Not on file  Physical Activity:   . Days of Exercise per Week: Not on file  . Minutes of Exercise per Session: Not on file  Stress:   . Feeling of Stress : Not on file  Social Connections:   . Frequency of Communication with Friends and Family: Not on file  . Frequency of Social Gatherings with Friends and Family: Not on file  . Attends Religious Services: Not on file  . Active Member of Clubs or Organizations: Not on file  . Attends Archivist Meetings: Not on file  . Marital Status: Not on file     Family History: The patient's family history includes Breast cancer in her maternal grandmother; Dementia in her father; Diabetes in her father and paternal grandmother; Hashimoto's thyroiditis in her mother; Heart disease in her father; Heart failure in her father; Hyperlipidemia in her mother; Prostate cancer in her paternal grandfather; Thyroid disease in her brother and mother.  ROS:   Please see the history of present illness.     All other systems reviewed and are negative.  EKGs/Labs/Other Studies Reviewed:    The following studies were reviewed today: Echocardiogram date 11/02/2016  Left ventricle: The cavity size was normal. There was mild   concentric hypertrophy. Systolic function was normal. The   estimated ejection fraction was in the range of 60% to 65%. Wall   motion was normal; there were no regional wall motion   abnormalities. Left ventricular diastolic function parameters   were normal. - Aortic valve: Transvalvular velocity was increased. There was   moderate stenosis. Peak velocity (S): 380 cm/s. Mean gradient   (S): 30 mm Hg. Peak gradient (S): 60 mm Hg. Valve area  (VTI):   1.13 cm^2. - Aortic root: The aortic root was normal in size. - Right ventricle: Systolic function was normal. - Pulmonary arteries: Systolic pressure was within the normal   range.  EKG:  EKG not ordered today.  Recent Labs: 10/14/2020: ALT 44; BUN 16; Creatinine, Ser 0.66; Hemoglobin 13.1; Platelets 137; Potassium 3.8; Sodium 137; TSH 3.030  Recent Lipid Panel    Component Value Date/Time   CHOL 183 10/14/2020 1009   TRIG  229 (H) 10/14/2020 1009   HDL 33 (L) 10/14/2020 1009   CHOLHDL 5.2 (H) 08/14/2019 0920   LDLCALC 110 (H) 10/14/2020 1009    Physical Exam:    VS:  BP 130/80   Pulse 73   Ht 5\' 4"  (1.626 m)   Wt 192 lb (87.1 kg)   BMI 32.96 kg/m     Wt Readings from Last 3 Encounters:  10/27/20 192 lb (87.1 kg)  10/14/20 188 lb (85.3 kg)  09/26/20 188 lb 2 oz (85.3 kg)     GEN:  Well nourished, well developed in no acute distress HEENT: Normal NECK: No JVD; No carotid bruits LYMPHATICS: No lymphadenopathy CARDIAC: RRR, 3/6 systolic murmur right upper sternal border, no rubs, no gallops RESPIRATORY:  Clear to auscultation without rales, wheezing or rhonchi  ABDOMEN: Soft, non-tender, non-distended MUSCULOSKELETAL:  No edema; No deformity  SKIN: Warm and dry NEUROLOGIC:  Alert and oriented x 3 PSYCHIATRIC:  Normal affect   ASSESSMENT:    1. Aortic valve stenosis, etiology of cardiac valve disease unspecified   2. Essential hypertension    PLAN:    In order of problems listed above:  1. history of moderate aortic stenosis. echocardiogram on 02/2020 showed normal ejection fraction with EF 41-93, grade 1 diastolic dysfunction, moderate aortic stenosis.  Plan for repeat echocardiogram at 02/2021.Marland Kitchen  Next echo will be planned around 02/2021. 2. History of hypertension,.  BP now controlled.  Continue Coreg 12.5 mg twice daily.  lisinopril and HCTZ.  For some reason, BP elevated at home readings./2 clinic visits blood pressures have been controlled.  Advised  patient to bring blood pressure cuff to the office sometimes next week.  This could be a nurse or PA visit.  I suspect her blood pressure cuff/machine could be off compared to ours.  If this is the case, continue medications as prescribed.  If BP elevated, can titrate Coreg.  Follow-up next week for BP via home cuff correlation.  Make appointment for echocardiogram 02/2021, follow-up with myself after.   This note was generated in part or whole with voice recognition software. Voice recognition is usually quite accurate but there are transcription errors that can and very often do occur. I apologize for any typographical errors that were not detected and corrected.  Medication Adjustments/Labs and Tests Ordered: Current medicines are reviewed at length with the patient today.  Concerns regarding medicines are outlined above.  Orders Placed This Encounter  Procedures  . ECHOCARDIOGRAM COMPLETE   No orders of the defined types were placed in this encounter.   Patient Instructions  Medication Instructions:   Your physician recommends that you continue on your current medications as directed. Please refer to the Current Medication list given to you today.  *If you need a refill on your cardiac medications before your next appointment, please call your pharmacy*   Lab Work: None Ordered If you have labs (blood work) drawn today and your tests are completely normal, you will receive your results only by: Marland Kitchen MyChart Message (if you have MyChart) OR . A paper copy in the mail If you have any lab test that is abnormal or we need to change your treatment, we will call you to review the results.   Testing/Procedures:  1.  Your physician has requested that you have an echocardiogram in March 2022. Echocardiography is a painless test that uses sound waves to create images of your heart. It provides your doctor with information about the size and shape of your  heart and how well your heart's  chambers and valves are working. This procedure takes approximately one hour. There are no restrictions for this procedure.   Follow-Up: At [facility], you and your health needs are our priority.  As part of our continuing mission to provide you with exceptional heart care, we have created designated Provider Care Teams.  These Care Teams include your primary Cardiologist (physician) and Advanced Practice Providers (APPs -  Physician Assistants and Nurse Practitioners) who all work together to provide you with the care you need, when you need it.  We recommend signing up for the patient portal called "MyChart".  Sign up information is provided on this After Visit Summary.  MyChart is used to connect with patients for Virtual Visits (Telemedicine).  Patients are able to view lab/test results, encounter notes, upcoming appointments, etc.  Non-urgent messages can be sent to your provider as well.   To learn more about what you can do with MyChart, go to [email or web address].    Your next appointment:    1.  Next week with APP for a BP cuff check 2.  Follow up in March after Echo with Dr. [name]  The format for your next appointment:   In Person  Provider:   You may see Emily Sable, MD or one of the following Advanced Practice Providers on your designated Care Team:    [name], NP  [name], PA-C  [name], PA-C  Cadence [place], [place]  [name], NP    Other Instructions       Signed, Emily Sable, MD  10/27/2020 5:00 PM    [place]

## 2020-10-27 NOTE — Telephone Encounter (Signed)
Was on the Trulicity injection last year and cardiologist reported she had stopped taking it after the [facility] pharmacist had helped get the coupon savings arranged. Was there a reaction or problem with taking it then that she stopped it?

## 2020-10-27 NOTE — Patient Instructions (Signed)
Medication Instructions:   Your physician recommends that you continue on your current medications as directed. Please refer to the Current Medication list given to you today.  *If you need a refill on your cardiac medications before your next appointment, please call your pharmacy*   Lab Work: None Ordered If you have labs (blood work) drawn today and your tests are completely normal, you will receive your results only by: Marland Kitchen MyChart Message (if you have MyChart) OR . A paper copy in the mail If you have any lab test that is abnormal or we need to change your treatment, we will call you to review the results.   Testing/Procedures:  1.  Your physician has requested that you have an echocardiogram in March 2022. Echocardiography is a painless test that uses sound waves to create images of your heart. It provides your doctor with information about the size and shape of your heart and how well your heart's chambers and valves are working. This procedure takes approximately one hour. There are no restrictions for this procedure.   Follow-Up: At [facility], you and your health needs are our priority.  As part of our continuing mission to provide you with exceptional heart care, we have created designated Provider Care Teams.  These Care Teams include your primary Cardiologist (physician) and Advanced Practice Providers (APPs -  Physician Assistants and Nurse Practitioners) who all work together to provide you with the care you need, when you need it.  We recommend signing up for the patient portal called "MyChart".  Sign up information is provided on this After Visit Summary.  MyChart is used to connect with patients for Virtual Visits (Telemedicine).  Patients are able to view lab/test results, encounter notes, upcoming appointments, etc.  Non-urgent messages can be sent to your provider as well.   To learn more about what you can do with MyChart, go to [email or web address].    Your next  appointment:    1.  Next week with APP for a BP cuff check 2.  Follow up in March after Echo with Dr. [name]  The format for your next appointment:   In Person  Provider:   You may see [name], MD or one of the following Advanced Practice Providers on your designated Care Team:    [name], NP  [name], PA-C  [name], PA-C  Cadence [place], [place]  [name], NP    Other Instructions

## 2020-10-28 ENCOUNTER — Other Ambulatory Visit: Payer: Self-pay | Admitting: Family Medicine

## 2020-10-28 DIAGNOSIS — E119 Type 2 diabetes mellitus without complications: Secondary | ICD-10-CM

## 2020-10-28 MED ORDER — GLYBURIDE 5 MG PO TABS: ORAL_TABLET | ORAL | 0 refills | Status: DC

## 2020-10-28 MED ORDER — LISINOPRIL 40 MG PO TABS: 40.0000 mg | ORAL_TABLET | Freq: Every day | ORAL | 1 refills | Status: DC

## 2020-10-28 NOTE — Telephone Encounter (Signed)
Patient was advised. There are 2 pens for pt. Patient has appointment scheduled.

## 2020-10-28 NOTE — Telephone Encounter (Signed)
Call to patient - follow up has been schedule as directed in December- RF granted

## 2020-10-28 NOTE — Telephone Encounter (Signed)
Patient states she was never on Trulicity. She was on Victoza injections for a while. Please advise?

## 2020-10-28 NOTE — Telephone Encounter (Signed)
Copied from [place] [phone]. Topic: Quick Communication - Rx Refill/Question >> Oct 28, 2020  2:19 PM Leward Quan A wrote: Medication: levothyroxine (SYNTHROID) 75 MCG tablet, glyBURIDE (DIABETA) 5 MG tablet   Has the patient contacted their pharmacy? Yes.   (Agent: If no, request that the patient contact the pharmacy for the refill.) (Agent: If yes, when and what did the pharmacy advise?)  Preferred Pharmacy (with phone number or street name): [facility] [place]  Phone:  [phone] Fax:  [phone]     Agent: Please be advised that RX refills may take up to 3 business days. We ask that you follow-up with your pharmacy.

## 2020-10-28 NOTE — Telephone Encounter (Signed)
Pt had OV ov on 10/9 and is requesting her '  glyBURIDE (DIABETA) 5 MG tablet  lisinopril (ZESTRIL) 40 MG tablet(Expired)   [place] (N), [place] - [address] Phone:  [phone]  Fax:  [phone]

## 2020-10-28 NOTE — Telephone Encounter (Signed)
Thought she had tried the Trulicity when I gave her a month supply of samples at the 08-14-19 office visit and scheduled a referral to the [facility] Pharmacist to try to get help with obtaining the prescription. Chart indicates she was on the Victoza and Ozempic in 2019. If she did not have any adverse effects or allergy reactions to the Trulicity, we can try it again. It would be a very good choice. May have samples, if we have them. Should plan follow up appointment in 2 months.

## 2020-10-28 NOTE — Telephone Encounter (Signed)
Requested medication (s) are due for refill today: Yes  Requested medication (s) are on the active medication list: Yes  Last refill:  07/25/20  Future visit scheduled: No  Notes to clinic:  Refused 10/23/20. Last visit 10/14/20.    Requested Prescriptions  Pending Prescriptions Disp Refills   glyBURIDE (DIABETA) 5 MG tablet 180 tablet 0    Sig: TAKE 1 TABLET BY MOUTH TWICE DAILY WITH MEALS. NEEDS TO SCHEDULE A FOLLOW UP VISIT.      Endocrinology:  Diabetes - Sulfonylureas Failed - 10/28/2020  9:42 AM      Failed - HBA1C is between 0 and 7.9 and within 180 days    Hgb A1c MFr Bld  Date Value Ref Range Status  10/14/2020 12.7 (H) 4.8 - 5.6 % Final    Comment:             Prediabetes: 5.7 - 6.4          Diabetes: >6.4          Glycemic control for adults with diabetes: <7.0           Passed - Valid encounter within last 6 months    Recent Outpatient Visits           2 weeks ago Essential (primary) hypertension   [facility], [place], [place]   9 months ago [place], [place], PA-C   1 year ago Type 2 diabetes mellitus with diabetic neuropathy, without long-term current use of insulin ([place])   [place], [place], [place]   1 year ago Type 2 diabetes mellitus with diabetic neuropathy, without long-term current use of insulin [facility])   [place] [facility] [place], [place], PA-C   1 year ago Type 2 diabetes mellitus with diabetic neuropathy, unspecified whether long term insulin use [facility])   [facility] [place], Wendee Beavers, PA-C       Future Appointments             In 1 month Sharolyn Douglas, Clance Boll, NP [facility], [facility]   In 3 months Agbor-Etang, Aaron Edelman, MD [place], [facility]             Signed Prescriptions Disp Refills   lisinopril (ZESTRIL) 40 MG tablet 90 tablet 1    Sig: Take 1 tablet (40 mg total) by mouth daily.       Cardiovascular:  ACE Inhibitors Passed - 10/28/2020  9:42 AM      Passed - Cr in normal range and within 180 days    Creatinine, Ser  Date Value Ref Range Status  10/14/2020 0.66 0.57 - 1.00 mg/dL Final   Creatinine, POC  Date Value Ref Range Status  02/24/2016 NA mg/dL Final          Passed - K in normal range and within 180 days    Potassium  Date Value Ref Range Status  10/14/2020 3.8 3.5 - 5.2 mmol/L Final          Passed - Patient is not pregnant      Passed - Last BP in normal range    BP Readings from Last 1 Encounters:  10/27/20 130/80          Passed - Valid encounter within last 6 months    Recent Outpatient Visits           2 weeks ago Essential (primary) hypertension   [facility], Vickki Muff,  [place]   9 months ago [place], Fabio Bering M, [place]   1 year ago Type 2 diabetes mellitus with diabetic neuropathy, without long-term current use of insulin [facility])   [place], [place], [place]   1 year ago Type 2 diabetes mellitus with diabetic neuropathy, without long-term current use of insulin [facility])   [facility] [employer], [place], PA-C   1 year ago Type 2 diabetes mellitus with diabetic neuropathy, unspecified whether long term insulin use [facility])   [facility] Trinna Post, [place]       Future Appointments             In 1 month Sharolyn Douglas, Clance Boll, NP [facility], [place]   In 3 months [name], MD [facility], [place]

## 2020-10-30 MED ORDER — GLYBURIDE 5 MG PO TABS: ORAL_TABLET | ORAL | 0 refills | Status: DC

## 2020-11-28 ENCOUNTER — Ambulatory Visit (INDEPENDENT_AMBULATORY_CARE_PROVIDER_SITE_OTHER): Payer: 59 | Admitting: Family

## 2020-11-28 ENCOUNTER — Other Ambulatory Visit: Payer: Self-pay

## 2020-11-28 ENCOUNTER — Encounter: Payer: Self-pay | Admitting: Family

## 2020-11-28 VITALS — BP 148/76 | HR 84 | Ht 64.0 in | Wt 194.0 lb

## 2020-11-28 DIAGNOSIS — I350 Nonrheumatic aortic (valve) stenosis: Secondary | ICD-10-CM | POA: Diagnosis not present

## 2020-11-28 DIAGNOSIS — I10 Essential (primary) hypertension: Secondary | ICD-10-CM

## 2020-11-28 MED ORDER — CARVEDILOL 25 MG PO TABS: 25.0000 mg | ORAL_TABLET | Freq: Two times a day (BID) | ORAL | 3 refills | Status: DC

## 2020-11-28 NOTE — Patient Instructions (Addendum)
Medication Instructions:  Your physician has recommended you make the following change in your medication:   CHANGE Carvedilol to 25mg  twice daily *You may use up the rest of your 12.5mg  tablets by taking two tablets twice daily*  [name], NP will reach out to you next Monday via MyChart to check in on your blood pressure!  *If you need a refill on your cardiac medications before your next appointment, please call your pharmacy*  Lab Work: None ordered today.   Testing/Procedures: We compared your blood pressure cuff to our manual reading:  Your cuff measured: 182/84 Our reading: 172/80  So at home, simply subtract 10 from your reading.  Follow-Up: At [facility], you and your health needs are our priority.  As part of our continuing mission to provide you with exceptional heart care, we have created designated Provider Care Teams.  These Care Teams include your primary Cardiologist (physician) and Advanced Practice Providers (APPs -  Physician Assistants and Nurse Practitioners) who all work together to provide you with the care you need, when you need it.  Your next appointment:   In March with Dr. [name]  The format for your next appointment:   In Person  Provider:   You may see [name], MD or one of the following Advanced Practice Providers on your designated Care Team:    [name], NP  [name], PA-C  [name], PA-C  [name], [place]  [name], NP   Other Instructions  Tips to Measure your Blood Pressure Correctly  Here's what you can do to ensure a correct reading: . Don't drink a caffeinated beverage or smoke during the 30 minutes before the test. . Sit quietly for five minutes before the test begins. . During the measurement, sit in a chair with your feet on the floor and your arm supported so your elbow is at about heart level. . The inflatable part of the cuff should completely cover at least 80% of  your upper arm, and the cuff should be placed on bare skin, not over a shirt. . Don't talk during the measurement. . Have your blood pressure measured twice, with a brief break in between. If the readings are different by 5 points or more, have it done a third time.  There are times to break these rules. If you sometimes feel lightheaded when getting out of bed in the morning or when you stand after sitting, you should have your blood pressure checked while seated and then while standing to see if it falls from one position to the next.  Because blood pressure varies throughout the day, your doctor will rarely diagnose hypertension on the basis of a single reading. Instead, he or she will want to confirm the measurements on at least two occasions, usually within a few weeks of one another. The exception to this rule is if you have a blood pressure reading of 180/110 mm Hg or higher. A result this high usually calls for prompt treatment.  In 2017, new guidelines from the [place], the [employer] of Cardiology, and nine other health organizations lowered the diagnosis of high blood pressure to 130/80 mm Hg or higher for all adults. The guidelines also redefined the various blood pressure categories to now include normal, elevated, Stage 1 hypertension, Stage 2 hypertension, and hypertensive crisis (see "Blood pressure categories").  Blood pressure categories  Blood pressure category SYSTOLIC (upper number)  DIASTOLIC (lower number)  Normal Less than 120 mm  Hg and Less than 80 mm Hg  Elevated 120-129 mm Hg and Less than 80 mm Hg  High blood pressure: Stage 1 hypertension 130-139 mm Hg or 80-89 mm Hg  High blood pressure: Stage 2 hypertension 140 mm Hg or higher or 90 mm Hg or higher  Hypertensive crisis (consult your doctor immediately) Higher than 180 mm Hg and/or Higher than 120 mm Hg  Source: American Heart Association and American Stroke Association. For more on getting  your blood pressure under control, buy Controlling Your Blood Pressure, a Special Health Report from [facility].   Blood Pressure Log   Date   Time  Blood Pressure  Position  Example: Nov 1 9 AM 124/78 sitting

## 2020-11-28 NOTE — Progress Notes (Signed)
Office Visit    Patient Name: Emily Tapia Date of Encounter: 11/28/2020  Primary Care Provider:  Margo Common, PA-C Primary Cardiologist:  [name], MD Electrophysiologist:  None   Chief Complaint    Emily Tapia is a 59 y.o. female with a hx of HTN, DM2, moderate aortic stenosis presents today for blood pressure cuff.    Past Medical History    Past Medical History:  Diagnosis Date  . Anxiety   . Aortic stenosis   . Diabetes mellitus   . Edema   . Heart murmur   . Hypertension   . Sleep apnea   . Thyroid disease    Past Surgical History:  Procedure Laterality Date  . ABDOMINAL HYSTERECTOMY  11/17/2013  . BREAST BIOPSY     x2  . CESAREAN SECTION     x 2  . CHOLECYSTECTOMY  2003   DUE TO STONES  . DILATION AND CURETTAGE OF UTERUS    . ELBOW SURGERY    . POLYPECTOMY  1995   Allergies  No Known Allergies  History of Present Illness    Emily Tapia is a 59 y.o. female with a hx of  HTN, DM2, moderate aortic stenosis  last seen 10/27/20 by Dr. [name].  She had echo March 2021 with normal LVEF 60-35%, impaired relaxation, moderate aortic stenosis.  She has been working with primary care as well as cardiology for optimization of her blood pressure control.  Her metoprolol has previously been changed to carvedilol.  Lisinopril and hydrochlorothiazide have been continued.  She was last seen 10/27/2020 by Dr. [name] with blood pressure 130/80 then noted home readings greater than 140.  She was recommended for repeat clinic visit and to bring her blood pressure cuff for assessment.  She presents today for follow-up.  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 Checks her blood pressure at varied times throughout the day. Tells me it is normally at least 2 hours after her medication. Checks it in her left arm with arm cuff.  We checked her BP cuff in clinic.  Her arm cuff measured 182/84 and my manual  check measured 172/80. Her home BP cuff gives a reading 10 points higher than manual reading.   She exercises regularly by walking during her lunch break.  She endorses trying to eat a low-sodium, heart healthy diet. Takes her Lasix as needed, mostly once every 3-4 days.  Tells me she only notices occasional dyspnea on exertion and that is one of her indications to take her PRN Lasix.   EKGs/Labs/Other Studies Reviewed:   The following studies were reviewed today:  Echo 02/2020  1. Left ventricular ejection fraction, by estimation, is 60 to 65%. The  left ventricle has normal function. Left ventricular endocardial border  not optimally defined to evaluate regional wall motion. Left ventricular  diastolic parameters are consistent  with Grade I diastolic dysfunction (impaired relaxation).   2. Right ventricular systolic function is normal. The right ventricular  size is not well visualized. Tricuspid regurgitation signal is inadequate  for assessing PA pressure.   3. The mitral valve is grossly normal. Trivial mitral valve  regurgitation.   4. The aortic valve was not well visualized. Aortic valve regurgitation  is not visualized. Moderate aortic valve stenosis.   5. Pulmonic valve regurgitation not well visualized.   EKG:  No EKG is ordered today.  Recent Labs: 10/14/2020: ALT 44; BUN 16; Creatinine, Ser 0.66;  Hemoglobin 13.1; Platelets 137; Potassium 3.8; Sodium 137; TSH 3.030  Recent Lipid Panel    Component Value Date/Time   CHOL 183 10/14/2020 1009   TRIG 229 (H) 10/14/2020 1009   HDL 33 (L) 10/14/2020 1009   CHOLHDL 5.2 (H) 08/14/2019 0920   LDLCALC 110 (H) 10/14/2020 1009    Home Medications   Current Meds  Medication Sig  . albuterol (VENTOLIN HFA) 108 (90 Base) MCG/ACT inhaler Inhale into the lungs.  . Ascorbic Acid (VITAMIN C PO) Take by mouth daily.  Marland Kitchen aspirin 81 MG EC tablet Take 81 mg by mouth at bedtime.  . carvedilol (COREG) 12.5 MG tablet Take 1 tablet (12.5  mg total) by mouth 2 (two) times daily.  . Cholecalciferol (VITAMIN D3) 1000 UNITS CAPS Take 1 capsule by mouth every morning.  . Dulaglutide (TRULICITY [place]) Inject into the skin once a week.  . EUTHYROX 75 MCG tablet TAKE 1 TABLET BY MOUTH ONCE DAILY BEFORE BREAKFAST .OFFICE  VIST  NEEDED  BEFORE  NEXT  REFILL  IS  DUE  . Ferrous Sulfate (IRON) 325 (65 Fe) MG TABS Take 1 tablet by mouth once daily with breakfast  . furosemide (LASIX) 40 MG tablet TAKE 1 TABLET BY MOUTH ONCE DAILY AS NEEDED FOR FLUID OR EDEMA  . Glucosamine 500 MG CAPS Take 500 mg by mouth 2 (two) times daily.  Marland Kitchen glucose blood (CONTOUR NEXT TEST) test strip Test fasting sugar each morning. Recheck if having hypoglycemic symptoms.  Marland Kitchen glyBURIDE (DIABETA) 5 MG tablet TAKE 1 TABLET BY MOUTH TWICE DAILY WITH MEALS. NEEDS TO SCHEDULE A FOLLOW UP VISIT.  Marland Kitchen glyBURIDE (DIABETA) 5 MG tablet TAKE 1 TABLET BY MOUTH TWICE DAILY WITH MEALS. NEEDS TO SCHEDULE A FOLLOW UP VISIT.  Marland Kitchen lisinopril (ZESTRIL) 40 MG tablet Take 1 tablet (40 mg total) by mouth daily.  Marland Kitchen lisinopril-hydrochlorothiazide (ZESTORETIC) 20-25 MG tablet Take 1 tablet by mouth daily.  . meclizine (ANTIVERT) 25 MG tablet Take 1 tablet (25 mg total) by mouth 3 (three) times daily as needed for dizziness.  . metFORMIN (GLUCOPHAGE) 1000 MG tablet TAKE 1 TABLET BY MOUTH TWICE DAILY WITH MEALS  . metoprolol tartrate (LOPRESSOR) 25 MG tablet Take by mouth.  . Multiple Vitamin (MULTIVITAMIN) tablet Take 1 tablet by mouth every morning.  . Multiple Vitamins-Minerals (ZINC PO) Take by mouth daily.  . Omega-3 Fatty Acids (FISH OIL) 1000 MG CAPS Take by mouth.  Marland Kitchen PARoxetine (PAXIL) 20 MG tablet TAKE 1 TABLET BY MOUTH ONCE DAILY. SCHEDULE FOLLOW UP APPOINTMENT AND FASTING LABS IN THE NEXT 1 TO 2 MONTHS.     Review of Systems  All other systems reviewed and are otherwise negative except as noted above.  Physical Exam    VS:  BP (!) 148/76 (BP Location: Left Arm, Patient Position: Sitting,  Cuff Size: Normal)   Pulse 84   Ht 5\' 4"  (1.626 m)   Wt 194 lb (88 kg)   SpO2 94%   BMI 33.30 kg/m  , BMI Body mass index is 33.3 kg/m.  Wt Readings from Last 3 Encounters:  11/28/20 194 lb (88 kg)  10/27/20 192 lb (87.1 kg)  10/14/20 188 lb (85.3 kg)    GEN: Well nourished, well developed, in no acute distress. HEENT: normal. Neck: Supple, no JVD, carotid bruits, or masses. Cardiac: RRR, no murmurs, rubs, or gallops. No clubbing, cyanosis, edema.  Radials/DP/PT 2+ and equal bilaterally.  Respiratory:  Respirations regular and unlabored, clear to auscultation bilaterally. GI: Soft, nontender,  nondistended. MS: No deformity or atrophy. Skin: Warm and dry, no rash. Neuro:  Strength and sensation are intact. Psych: Normal affect.  Assessment & Plan    1. HTN -Home arm cuff checked in clinic today.  Her home cuff reads a systolic blood pressure 10 points higher than manual check.  The diastolic readings consistent with manual check.  Continue lisinopril 40 mg daily, hydrochlorothiazide 25 mg daily.  Increase carvedilol to 25 mg twice daily.  Will check in via MyChart message in 1 week.  2. Moderate aortic stenosis by echo 02/2020 - Repeat echo already scheduled for 02/2021. No signs of severe aortic stenosis.   Disposition: Follow up in March as previously scheduled with Dr. [name]  Signed, [name], NP 11/28/2020, 3:48 PM [place]

## 2020-11-30 ENCOUNTER — Ambulatory Visit: Payer: 59 | Admitting: Nurse Practitioner

## 2020-12-05 ENCOUNTER — Ambulatory Visit (INDEPENDENT_AMBULATORY_CARE_PROVIDER_SITE_OTHER): Payer: 59 | Admitting: Family Medicine

## 2020-12-05 ENCOUNTER — Other Ambulatory Visit: Payer: Self-pay

## 2020-12-05 ENCOUNTER — Encounter: Payer: Self-pay | Admitting: Family

## 2020-12-05 ENCOUNTER — Encounter: Payer: Self-pay | Admitting: Family Medicine

## 2020-12-05 VITALS — BP 143/69 | HR 70 | Temp 98.4°F | Wt 192.0 lb

## 2020-12-05 DIAGNOSIS — I350 Nonrheumatic aortic (valve) stenosis: Secondary | ICD-10-CM

## 2020-12-05 DIAGNOSIS — E1142 Type 2 diabetes mellitus with diabetic polyneuropathy: Secondary | ICD-10-CM

## 2020-12-05 MED ORDER — METFORMIN HCL 1000 MG PO TABS: 1000.0000 mg | ORAL_TABLET | Freq: Two times a day (BID) | ORAL | 3 refills | Status: DC

## 2020-12-05 MED ORDER — TRULICITY 3 MG/0.5ML ~~LOC~~ SOAJ: 3.0000 mg | SUBCUTANEOUS | 3 refills | Status: DC

## 2020-12-05 MED ORDER — GLYBURIDE 5 MG PO TABS: ORAL_TABLET | ORAL | 0 refills | Status: DC

## 2020-12-05 NOTE — Progress Notes (Signed)
Established patient visit   Patient: Emily Tapia   DOB: [birth year]   59 y.o. Female  MRN: [id number] Visit Date: 12/05/2020  Today's healthcare provider: Vernie Murders, PA-C   Chief Complaint  Patient presents with  . Diabetes   Subjective    HPI   Diabetes Mellitus Type II, Follow-up  Lab Results  Component Value Date   HGBA1C 12.7 (H) 10/14/2020   HGBA1C 12.0 (H) 08/14/2019   HGBA1C 9.5 (H) 10/03/2018   Wt Readings from Last 3 Encounters:  12/05/20 192 lb (87.1 kg)  11/28/20 194 lb (88 kg)  10/27/20 192 lb (87.1 kg)   Last seen for diabetes 2 months ago.  Management since then includes stopping Trulicity due to cost, however patient received samples and is still taking it.  Encouraged home glucose checks.  After labs were done recommended adding Jardiance and asked to follow up in 2 months.  However, patient states she is not on Jardiance. She reports good compliance with treatment. She is not having side effects.  Symptoms: No fatigue No foot ulcerations  No appetite changes No nausea  No paresthesia of the feet  No polydipsia  No polyuria No visual disturbances   No vomiting     Home blood sugar records: 173-236  Episodes of hypoglycemia? No    Current insulin regiment: none Most Recent Eye Exam: prior to 2020.  Patient advised to be seen by eye doctor. Current exercise: walking Current diet habits: tries to watch her carbs  Pertinent Labs: Lab Results  Component Value Date   CHOL 183 10/14/2020   HDL 33 (L) 10/14/2020   LDLCALC 110 (H) 10/14/2020   TRIG 229 (H) 10/14/2020   CHOLHDL 5.2 (H) 08/14/2019   Lab Results  Component Value Date   NA 137 10/14/2020   K 3.8 10/14/2020   CREATININE 0.66 10/14/2020   GFRNONAA 96 10/14/2020   GFRAA 111 10/14/2020   GLUCOSE 391 (H) 10/14/2020     Past Medical History:  Diagnosis Date  . Anxiety   . Aortic stenosis   . Diabetes mellitus   . Edema   . Heart murmur   . Hypertension   .  Sleep apnea   . Thyroid disease    Past Surgical History:  Procedure Laterality Date  . ABDOMINAL HYSTERECTOMY  11/17/2013  . BREAST BIOPSY     x2  . CESAREAN SECTION     x 2  . CHOLECYSTECTOMY  2003   DUE TO STONES  . DILATION AND CURETTAGE OF UTERUS    . ELBOW SURGERY    . POLYPECTOMY  1995   Family History  Problem Relation Age of Onset  . Hyperlipidemia Mother   . Thyroid disease Mother   . Hashimoto's thyroiditis Mother   . Heart disease Father        open heart surgery  . Heart failure Father   . Diabetes Father   . Dementia Father   . Thyroid disease Brother   . Breast cancer Maternal Grandmother   . Diabetes Paternal Grandmother   . Prostate cancer Paternal Grandfather    Social History   Tobacco Use  . Smoking status: Former Smoker    Packs/day: 1.50    Years: 25.00    Pack years: 37.50    Types: Cigarettes    Quit date: 11/24/2001    Years since quitting: 19.0  . Smokeless tobacco: Never Used  Vaping Use  . Vaping Use: Never used  Substance Use  Topics  . Alcohol use: Yes    Alcohol/week: 0.0 standard drinks    Comment: OCCASIONALLY DRINKS WINE, ONCE A WEEK  . Drug use: No   No Known Allergies    Medications: Outpatient Medications Prior to Visit  Medication Sig  . albuterol (VENTOLIN HFA) 108 (90 Base) MCG/ACT inhaler Inhale into the lungs.  . Ascorbic Acid (VITAMIN C PO) Take by mouth daily.  Marland Kitchen aspirin 81 MG EC tablet Take 81 mg by mouth at bedtime.  . carvedilol (COREG) 25 MG tablet Take 1 tablet (25 mg total) by mouth 2 (two) times daily.  . Cholecalciferol (VITAMIN D3) 1000 UNITS CAPS Take 1 capsule by mouth every morning.  . Dulaglutide (TRULICITY [place]) Inject into the skin once a week.  . EUTHYROX 75 MCG tablet TAKE 1 TABLET BY MOUTH ONCE DAILY BEFORE BREAKFAST .OFFICE  VIST  NEEDED  BEFORE  NEXT  REFILL  IS  DUE  . Ferrous Sulfate (IRON) 325 (65 Fe) MG TABS Take 1 tablet by mouth once daily with breakfast  . furosemide (LASIX) 40 MG tablet  TAKE 1 TABLET BY MOUTH ONCE DAILY AS NEEDED FOR FLUID OR EDEMA  . Glucosamine 500 MG CAPS Take 500 mg by mouth 2 (two) times daily.  Marland Kitchen glucose blood (CONTOUR NEXT TEST) test strip Test fasting sugar each morning. Recheck if having hypoglycemic symptoms.  Marland Kitchen glyBURIDE (DIABETA) 5 MG tablet TAKE 1 TABLET BY MOUTH TWICE DAILY WITH MEALS. NEEDS TO SCHEDULE A FOLLOW UP VISIT.  Marland Kitchen glyBURIDE (DIABETA) 5 MG tablet TAKE 1 TABLET BY MOUTH TWICE DAILY WITH MEALS. NEEDS TO SCHEDULE A FOLLOW UP VISIT.  . hydrochlorothiazide (HYDRODIURIL) 25 MG tablet Take 1 tablet (25 mg total) by mouth daily.  Marland Kitchen lisinopril (ZESTRIL) 40 MG tablet Take 1 tablet (40 mg total) by mouth daily.  . meclizine (ANTIVERT) 25 MG tablet Take 1 tablet (25 mg total) by mouth 3 (three) times daily as needed for dizziness.  . metFORMIN (GLUCOPHAGE) 1000 MG tablet TAKE 1 TABLET BY MOUTH TWICE DAILY WITH MEALS  . Multiple Vitamin (MULTIVITAMIN) tablet Take 1 tablet by mouth every morning.  . Multiple Vitamins-Minerals (ZINC PO) Take by mouth daily.  . Omega-3 Fatty Acids (FISH OIL) 1000 MG CAPS Take by mouth.  Marland Kitchen PARoxetine (PAXIL) 20 MG tablet TAKE 1 TABLET BY MOUTH ONCE DAILY. SCHEDULE FOLLOW UP APPOINTMENT AND FASTING LABS IN THE NEXT 1 TO 2 MONTHS.   No facility-administered medications prior to visit.    Review of Systems     Objective    BP (!) 143/69 (BP Location: Right Arm, Patient Position: Sitting, Cuff Size: Normal)   Pulse 70   Temp 98.4 F (36.9 C) (Oral)   Wt 192 lb (87.1 kg)   SpO2 100%   BMI 32.96 kg/m     Physical Exam    No results found for any visits on 12/05/20.  Assessment & Plan     1. Diabetic peripheral neuropathy associated with type 2 diabetes mellitus ([place]) Did not start the Jardiance due to cost. Working with CCM to try to get Trulicity. Will increase dosage to 3 mg and continue Metformin 1000 mg BID and Glyburide 5 mg BID. Continue diet and exercise. Monitor FBS daily and recheck in 3 months. -  Dulaglutide (TRULICITY) 3 JO/8.4ZY SOPN; Inject 3 mg as directed once a week.  Dispense: 2 mL; Refill: 3  2. Moderate aortic stenosis Unchanged murmur. Scheduled for follow up with Dr. [name] (cardiologist) for echocardiogram.  No follow-ups on file.      I, Takya Vandivier, PA-C, have reviewed all documentation for this visit. The documentation on 12/05/20 for the exam, diagnosis, procedures, and orders are all accurate and complete.    Vernie Murders, PA-C  [employer] [phone] (phone) [phone] (fax)  [place]

## 2020-12-19 ENCOUNTER — Other Ambulatory Visit: Payer: Self-pay | Admitting: Family Medicine

## 2020-12-19 DIAGNOSIS — F411 Generalized anxiety disorder: Secondary | ICD-10-CM

## 2020-12-21 ENCOUNTER — Other Ambulatory Visit: Payer: Self-pay | Admitting: Family Medicine

## 2020-12-21 DIAGNOSIS — F411 Generalized anxiety disorder: Secondary | ICD-10-CM

## 2020-12-21 NOTE — Telephone Encounter (Signed)
Chart indicates this was refilled on 12-19-20 at 7:54 am at [facility].

## 2021-01-01 ENCOUNTER — Other Ambulatory Visit: Payer: Self-pay

## 2021-01-02 ENCOUNTER — Telehealth: Payer: Self-pay | Admitting: Cardiology

## 2021-01-02 MED ORDER — HYDROCHLOROTHIAZIDE 25 MG PO TABS: 25.0000 mg | ORAL_TABLET | Freq: Every day | ORAL | 1 refills | Status: DC

## 2021-01-02 NOTE — Telephone Encounter (Signed)
*  STAT* If patient is at the pharmacy, call can be transferred to refill team.   1. Which medications need to be refilled? (please list name of each medication and dose if known) lisinopril  2. Which pharmacy/location (including street and city if local pharmacy) is medication to be sent to?[facility] [place]  3. Do they need a 30 day or 90 day supply? [place]

## 2021-01-03 MED ORDER — LISINOPRIL 40 MG PO TABS
40.0000 mg | ORAL_TABLET | Freq: Every day | ORAL | 1 refills | Status: DC
Start: 2021-01-03 — End: 2021-06-25

## 2021-01-03 NOTE — Telephone Encounter (Signed)
Rx request sent to pharmacy.  

## 2021-01-15 ENCOUNTER — Other Ambulatory Visit: Payer: Self-pay | Admitting: Family Medicine

## 2021-01-15 DIAGNOSIS — E1142 Type 2 diabetes mellitus with diabetic polyneuropathy: Secondary | ICD-10-CM

## 2021-01-16 MED ORDER — GLYBURIDE 5 MG PO TABS: ORAL_TABLET | ORAL | 0 refills | Status: DC

## 2021-02-17 ENCOUNTER — Ambulatory Visit: Payer: 59 | Admitting: Adult Health

## 2021-02-17 ENCOUNTER — Ambulatory Visit: Payer: 59 | Admitting: Family Medicine

## 2021-02-21 ENCOUNTER — Other Ambulatory Visit: Payer: 59

## 2021-02-24 ENCOUNTER — Ambulatory Visit: Payer: 59 | Admitting: Cardiology

## 2021-03-14 ENCOUNTER — Ambulatory Visit (INDEPENDENT_AMBULATORY_CARE_PROVIDER_SITE_OTHER): Payer: 59

## 2021-03-14 ENCOUNTER — Other Ambulatory Visit: Payer: Self-pay

## 2021-03-14 DIAGNOSIS — I350 Nonrheumatic aortic (valve) stenosis: Secondary | ICD-10-CM | POA: Diagnosis not present

## 2021-03-14 MED ORDER — PERFLUTREN LIPID MICROSPHERE
1.0000 mL | INTRAVENOUS | Status: AC | PRN
  Administered 2021-03-14: 2 mL via INTRAVENOUS

## 2021-03-15 LAB — ECHOCARDIOGRAM COMPLETE
AR max vel: 0.75 cm2
AV Area VTI: 0.92 cm2
AV Area mean vel: 0.85 cm2
AV Mean grad: 19 mmHg
AV Peak grad: 34 mmHg
Ao pk vel: 2.92 m/s
Area-P 1/2: 3.46 cm2
S' Lateral: 2.8 cm

## 2021-03-20 ENCOUNTER — Other Ambulatory Visit: Payer: Self-pay

## 2021-03-20 ENCOUNTER — Ambulatory Visit: Payer: 59 | Admitting: Cardiology

## 2021-03-20 ENCOUNTER — Encounter: Payer: Self-pay | Admitting: Cardiology

## 2021-03-20 VITALS — BP 132/62 | HR 68 | Ht 64.0 in | Wt 188.0 lb

## 2021-03-20 DIAGNOSIS — I350 Nonrheumatic aortic (valve) stenosis: Secondary | ICD-10-CM | POA: Diagnosis not present

## 2021-03-20 DIAGNOSIS — I10 Essential (primary) hypertension: Secondary | ICD-10-CM | POA: Diagnosis not present

## 2021-03-20 NOTE — Patient Instructions (Signed)
Medication Instructions:  Your physician recommends that you continue on your current medications as directed. Please refer to the Current Medication list given to you today.  *If you need a refill on your cardiac medications before your next appointment, please call your pharmacy*   Lab Work: None ordered    Testing/Procedures:  1. Your physician has requested that you have an echocardiogram in 1 year. Echocardiography is a painless test that uses sound waves to create images of your heart. It provides your doctor with information about the size and shape of your heart and how well your heart's chambers and valves are working. This procedure takes approximately one hour. There are no restrictions for this procedure.     Follow-Up: At [facility], you and your health needs are our priority.  As part of our continuing mission to provide you with exceptional heart care, we have created designated Provider Care Teams.  These Care Teams include your primary Cardiologist (physician) and Advanced Practice Providers (APPs -  Physician Assistants and Nurse Practitioners) who all work together to provide you with the care you need, when you need it.  We recommend signing up for the patient portal called "MyChart".  Sign up information is provided on this After Visit Summary.  MyChart is used to connect with patients for Virtual Visits (Telemedicine).  Patients are able to view lab/test results, encounter notes, upcoming appointments, etc.  Non-urgent messages can be sent to your provider as well.   To learn more about what you can do with MyChart, go to [email or web address].    Your next appointment:   1 year(s)  The format for your next appointment:   In Person  Provider:   [name], MD   Other Instructions

## 2021-03-20 NOTE — Progress Notes (Signed)
Cardiology Office Note:    Date:  03/20/2021   ID:  Emily Tapia, DOB September 16, 1961, MRN [id number]  PCP:  Margo Common, PA-C  Cardiologist:  [name], MD  Electrophysiologist:  None   Referring MD: Margo Common, PA-C   Chief Complaint  Patient presents with  . Other    Follow up post ECHO - Patient c/o palpitations that make her lightheaded. Meds reviewed verbally with patient.     History of Present Illness:    Emily Tapia is a 60 y.o. female with a hx of hypertension, diabetes, moderate aortic stenosis who presents for follow-up.    Being seen for hypertension and moderate aortic stenosis.  Recent echocardiogram 02/2020.-Repeat echocardiogram 1 year from prior to evaluate aortic valve disease.  Blood pressure medication/Coreg was increased after last visit.  Her blood pressures have been much better, with systolics in the 379K.  She denies chest pain or shortness of breath.  Occasionally has skipped heartbeat, causing mild dizziness for a few seconds.   Prior notes Echo 02/2760 normal systolic function, EF 60 to 65%, impaired relaxation, moderate aortic stenosis.  Past Medical History:  Diagnosis Date  . Anxiety   . Aortic stenosis   . Diabetes mellitus   . Edema   . Heart murmur   . Hypertension   . Sleep apnea   . Thyroid disease     Past Surgical History:  Procedure Laterality Date  . ABDOMINAL HYSTERECTOMY  11/17/2013  . BREAST BIOPSY     x2  . CESAREAN SECTION     x 2  . CHOLECYSTECTOMY  2003   DUE TO STONES  . DILATION AND CURETTAGE OF UTERUS    . ELBOW SURGERY    . POLYPECTOMY  1995    Current Medications: Current Meds  Medication Sig  . Ascorbic Acid (VITAMIN C PO) Take by mouth daily.  Marland Kitchen aspirin 81 MG EC tablet Take 81 mg by mouth at bedtime.  . carvedilol (COREG) 25 MG tablet Take 1 tablet (25 mg total) by mouth 2 (two) times daily.  . Cholecalciferol (VITAMIN D3) 1000 UNITS CAPS Take 1 capsule by mouth every  morning.  . Dulaglutide (TRULICITY) 3 YJ/0.9KH SOPN Inject 3 mg as directed once a week.  . EUTHYROX 75 MCG tablet TAKE 1 TABLET BY MOUTH ONCE DAILY BEFORE BREAKFAST .OFFICE  VIST  NEEDED  BEFORE  NEXT  REFILL  IS  DUE  . Ferrous Sulfate (IRON) 325 (65 Fe) MG TABS Take 1 tablet by mouth once daily with breakfast  . furosemide (LASIX) 40 MG tablet TAKE 1 TABLET BY MOUTH ONCE DAILY AS NEEDED FOR FLUID OR EDEMA  . glucose blood (CONTOUR NEXT TEST) test strip Test fasting sugar each morning. Recheck if having hypoglycemic symptoms.  Marland Kitchen glyBURIDE (DIABETA) 5 MG tablet TAKE 1 TABLET BY MOUTH TWICE DAILY WITH MEALS.  . hydrochlorothiazide (HYDRODIURIL) 25 MG tablet Take 1 tablet (25 mg total) by mouth daily.  Marland Kitchen lisinopril (ZESTRIL) 40 MG tablet Take 1 tablet (40 mg total) by mouth daily.  . meclizine (ANTIVERT) 25 MG tablet Take 1 tablet (25 mg total) by mouth 3 (three) times daily as needed for dizziness.  . metFORMIN (GLUCOPHAGE) 1000 MG tablet Take 1 tablet (1,000 mg total) by mouth 2 (two) times daily with a meal.  . Multiple Vitamin (MULTIVITAMIN) tablet Take 1 tablet by mouth every morning.  . Multiple Vitamins-Minerals (ZINC PO) Take by mouth daily.  Marland Kitchen PARoxetine (PAXIL) 20 MG tablet TAKE  1 TABLET BY MOUTH ONCE DAILY. SCHEDULE FOLLOW UP APPOINTMENT AND FASTING LABS IN THE NEXT 1 TO 2 MONTHS     Allergies:   Patient has no known allergies.   Social History   Socioeconomic History  . Marital status: Married    Spouse name: Emily Tapia  . Number of children: 2  . Years of education: 17  . Highest education level: Not on file  Occupational History  . Occupation: [employer]  Tobacco Use  . Smoking status: Former Smoker    Packs/day: 1.50    Years: 25.00    Pack years: 37.50    Types: Cigarettes    Quit date: 11/24/2001    Years since quitting: 19.3  . Smokeless tobacco: Never Used  Vaping Use  . Vaping Use: Never used  Substance and Sexual Activity  . Alcohol use: Yes    Alcohol/week: 0.0  standard drinks    Comment: OCCASIONALLY DRINKS WINE, ONCE A WEEK  . Drug use: No  . Sexual activity: Not on file  Other Topics Concern  . Not on file  Social History Narrative  . Not on file   Social Determinants of Health   Financial Resource Strain: Not on file  Food Insecurity: Not on file  Transportation Needs: Not on file  Physical Activity: Not on file  Stress: Not on file  Social Connections: Not on file     Family History: The patient's family history includes Breast cancer in her maternal grandmother; Dementia in her father; Diabetes in her father and paternal grandmother; Hashimoto's thyroiditis in her mother; Heart disease in her father; Heart failure in her father; Hyperlipidemia in her mother; Prostate cancer in her paternal grandfather; Thyroid disease in her brother and mother.  ROS:   Please see the history of present illness.     All other systems reviewed and are negative.  EKGs/Labs/Other Studies Reviewed:    The following studies were reviewed today: Echocardiogram date 11/02/2016  Left ventricle: The cavity size was normal. There was mild   concentric hypertrophy. Systolic function was normal. The   estimated ejection fraction was in the range of 60% to 65%. Wall   motion was normal; there were no regional wall motion   abnormalities. Left ventricular diastolic function parameters   were normal. - Aortic valve: Transvalvular velocity was increased. There was   moderate stenosis. Peak velocity (S): 380 cm/s. Mean gradient   (S): 30 mm Hg. Peak gradient (S): 60 mm Hg. Valve area (VTI):   1.13 cm^2. - Aortic root: The aortic root was normal in size. - Right ventricle: Systolic function was normal. - Pulmonary arteries: Systolic pressure was within the normal   range.  EKG:  EKG not ordered today.  Recent Labs: 10/14/2020: ALT 44; BUN 16; Creatinine, Ser 0.66; Hemoglobin 13.1; Platelets 137; Potassium 3.8; Sodium 137; TSH 3.030  Recent Lipid Panel     Component Value Date/Time   CHOL 183 10/14/2020 1009   TRIG 229 (H) 10/14/2020 1009   HDL 33 (L) 10/14/2020 1009   CHOLHDL 5.2 (H) 08/14/2019 0920   LDLCALC 110 (H) 10/14/2020 1009    Physical Exam:    VS:  BP 132/62 (BP Location: Left Arm, Patient Position: Sitting, Cuff Size: Normal)   Pulse 68   Ht 5\' 4"  (1.626 m)   Wt 188 lb (85.3 kg)   SpO2 96%   BMI 32.27 kg/m     Wt Readings from Last 3 Encounters:  03/20/21 188 lb (85.3 kg)  12/05/20  192 lb (87.1 kg)  11/28/20 194 lb (88 kg)     GEN:  Well nourished, well developed in no acute distress HEENT: Normal NECK: No JVD; No carotid bruits LYMPHATICS: No lymphadenopathy CARDIAC: RRR, 3/6 systolic murmur right upper sternal border, no rubs, no gallops RESPIRATORY:  Clear to auscultation without rales, wheezing or rhonchi  ABDOMEN: Soft, non-tender, non-distended MUSCULOSKELETAL:  No edema; No deformity  SKIN: Warm and dry NEUROLOGIC:  Alert and oriented x 3 PSYCHIATRIC:  Normal affect   ASSESSMENT:    1. Aortic valve stenosis, etiology of cardiac valve disease unspecified   2. Primary hypertension    PLAN:    In order of problems listed above:  1. Moderate aortic stenosis, repeat echocardiogram 02/2021 showed moderate aortic stenosis, EF 60 to 65%, , similar to echo from 02/2020.  Plan to repeat echocardiogram in about 1 year. 2. History of hypertension,.  BP much improved.  Continue Coreg 25 mg twice daily, lisinopril 40 mg, HCTZ 25 mg daily.  Follow-up in 12 months after repeat echocardiogram.   This note was generated in part or whole with voice recognition software. Voice recognition is usually quite accurate but there are transcription errors that can and very often do occur. I apologize for any typographical errors that were not detected and corrected.  Medication Adjustments/Labs and Tests Ordered: Current medicines are reviewed at length with the patient today.  Concerns regarding medicines are outlined  above.  Orders Placed This Encounter  Procedures  . EKG 12-Lead  . ECHOCARDIOGRAM COMPLETE   No orders of the defined types were placed in this encounter.   Patient Instructions  Medication Instructions:  Your physician recommends that you continue on your current medications as directed. Please refer to the Current Medication list given to you today.  *If you need a refill on your cardiac medications before your next appointment, please call your pharmacy*   Lab Work: None ordered    Testing/Procedures:  1. Your physician has requested that you have an echocardiogram in 1 year. Echocardiography is a painless test that uses sound waves to create images of your heart. It provides your doctor with information about the size and shape of your heart and how well your heart's chambers and valves are working. This procedure takes approximately one hour. There are no restrictions for this procedure.     Follow-Up: At [facility], you and your health needs are our priority.  As part of our continuing mission to provide you with exceptional heart care, we have created designated Provider Care Teams.  These Care Teams include your primary Cardiologist (physician) and Advanced Practice Providers (APPs -  Physician Assistants and Nurse Practitioners) who all work together to provide you with the care you need, when you need it.  We recommend signing up for the patient portal called "MyChart".  Sign up information is provided on this After Visit Summary.  MyChart is used to connect with patients for Virtual Visits (Telemedicine).  Patients are able to view lab/test results, encounter notes, upcoming appointments, etc.  Non-urgent messages can be sent to your provider as well.   To learn more about what you can do with MyChart, go to [email or web address].    Your next appointment:   1 year(s)  The format for your next appointment:   In Person  Provider:   [name],  MD   Other Instructions      Signed, [name], MD  03/20/2021 4:58 PM    [facility] 

## 2021-04-03 ENCOUNTER — Other Ambulatory Visit: Payer: Self-pay | Admitting: Family Medicine

## 2021-04-03 DIAGNOSIS — F411 Generalized anxiety disorder: Secondary | ICD-10-CM

## 2021-04-16 ENCOUNTER — Other Ambulatory Visit: Payer: Self-pay | Admitting: Family Medicine

## 2021-04-16 DIAGNOSIS — E1142 Type 2 diabetes mellitus with diabetic polyneuropathy: Secondary | ICD-10-CM

## 2021-04-17 MED ORDER — GLYBURIDE 5 MG PO TABS: ORAL_TABLET | ORAL | 0 refills | Status: DC

## 2021-04-23 ENCOUNTER — Other Ambulatory Visit: Payer: Self-pay | Admitting: Family Medicine

## 2021-04-23 DIAGNOSIS — E1142 Type 2 diabetes mellitus with diabetic polyneuropathy: Secondary | ICD-10-CM

## 2021-04-24 MED ORDER — GLYBURIDE 5 MG PO TABS: ORAL_TABLET | ORAL | 3 refills | Status: DC

## 2021-05-26 ENCOUNTER — Other Ambulatory Visit: Payer: Self-pay | Admitting: Family Medicine

## 2021-05-26 MED ORDER — FUROSEMIDE 40 MG PO TABS: ORAL_TABLET | ORAL | 1 refills | Status: DC

## 2021-05-26 NOTE — Telephone Encounter (Signed)
  Notes to clinic: this script has expired  Review for continued use and refill   Requested Prescriptions  Pending Prescriptions Disp Refills   furosemide (LASIX) 40 MG tablet [Pharmacy Med Name: Furosemide 40 MG Oral Tablet] 90 tablet 0    Sig: TAKE 1 TABLET BY MOUTH ONCE DAILY AS NEEDED FOR FLUID OR EDEMA      Cardiovascular:  Diuretics - Loop Passed - 05/26/2021  9:04 AM      Passed - K in normal range and within 360 days    Potassium  Date Value Ref Range Status  10/14/2020 3.8 3.5 - 5.2 mmol/L Final          Passed - Ca in normal range and within 360 days    Calcium  Date Value Ref Range Status  10/14/2020 9.8 8.7 - 10.3 mg/dL Final          Passed - Na in normal range and within 360 days    Sodium  Date Value Ref Range Status  10/14/2020 137 134 - 144 mmol/L Final          Passed - Cr in normal range and within 360 days    Creatinine, Ser  Date Value Ref Range Status  10/14/2020 0.66 0.57 - 1.00 mg/dL Final   Creatinine, POC  Date Value Ref Range Status  02/24/2016 NA mg/dL Final          Passed - Last BP in normal range    BP Readings from Last 1 Encounters:  03/20/21 132/62          Passed - Valid encounter within last 6 months    Recent Outpatient Visits           5 months ago Diabetic peripheral neuropathy associated with type 2 diabetes mellitus ([place])   [place], Vickki Muff, PA-C   7 months ago Essential (primary) hypertension   [facility], Vickki Muff, PA-C   1 year ago [place], Adriana M, PA-C   1 year ago Type 2 diabetes mellitus with diabetic neuropathy, without long-term current use of insulin [facility])   [place], Vickki Muff, PA-C   1 year ago Type 2 diabetes mellitus with diabetic neuropathy, without long-term current use of insulin [facility])   [facility] [place], [place], [place]

## 2021-05-26 NOTE — Telephone Encounter (Signed)
Requested medication (s) are due for refill today: Yes  Requested medication (s) are on the active medication list: Yes  Last refill:  03/28/20  Future visit scheduled: No  Notes to clinic:  Prescription expired.    Requested Prescriptions  Pending Prescriptions Disp Refills   furosemide (LASIX) 40 MG tablet 90 tablet 0      Cardiovascular:  Diuretics - Loop Passed - 05/26/2021  9:10 AM      Passed - K in normal range and within 360 days    Potassium  Date Value Ref Range Status  10/14/2020 3.8 3.5 - 5.2 mmol/L Final          Passed - Ca in normal range and within 360 days    Calcium  Date Value Ref Range Status  10/14/2020 9.8 8.7 - 10.3 mg/dL Final          Passed - Na in normal range and within 360 days    Sodium  Date Value Ref Range Status  10/14/2020 137 134 - 144 mmol/L Final          Passed - Cr in normal range and within 360 days    Creatinine, Ser  Date Value Ref Range Status  10/14/2020 0.66 0.57 - 1.00 mg/dL Final   Creatinine, POC  Date Value Ref Range Status  02/24/2016 NA mg/dL Final          Passed - Last BP in normal range    BP Readings from Last 1 Encounters:  03/20/21 132/62          Passed - Valid encounter within last 6 months    Recent Outpatient Visits           5 months ago Diabetic peripheral neuropathy associated with type 2 diabetes mellitus ([place])   [place], Vickki Muff, PA-C   7 months ago Essential (primary) hypertension   [facility], Vickki Muff, PA-C   1 year ago [place], [place], PA-C   1 year ago Type 2 diabetes mellitus with diabetic neuropathy, without long-term current use of insulin [facility])   [place], Vickki Muff, PA-C   1 year ago Type 2 diabetes mellitus with diabetic neuropathy, without long-term current use of insulin [facility])   [place], [place], [place]

## 2021-06-25 ENCOUNTER — Other Ambulatory Visit: Payer: Self-pay | Admitting: Family Medicine

## 2021-06-25 ENCOUNTER — Other Ambulatory Visit: Payer: Self-pay

## 2021-06-25 DIAGNOSIS — F411 Generalized anxiety disorder: Secondary | ICD-10-CM

## 2021-06-26 ENCOUNTER — Telehealth: Payer: Self-pay

## 2021-06-26 ENCOUNTER — Other Ambulatory Visit: Payer: Self-pay | Admitting: Family Medicine

## 2021-06-26 MED ORDER — HYDROCHLOROTHIAZIDE 25 MG PO TABS: 25.0000 mg | ORAL_TABLET | Freq: Every day | ORAL | 1 refills | Status: DC

## 2021-06-26 MED ORDER — LISINOPRIL 40 MG PO TABS: 40.0000 mg | ORAL_TABLET | Freq: Every day | ORAL | 1 refills | Status: DC

## 2021-06-26 NOTE — Telephone Encounter (Signed)
Copied from [place] [id number]. Topic: General - Other >> Jun 26, 2021  1:36 PM Tessa Lerner A wrote: Reason for CRM: Patient would like orders for a diagnostic mammogram submitted to [facility]  Patient would like to be contacted when the orders are submitted  Please contact to further advise if needed

## 2021-06-29 ENCOUNTER — Other Ambulatory Visit: Payer: Self-pay | Admitting: Family Medicine

## 2021-06-29 DIAGNOSIS — N6320 Unspecified lump in the left breast, unspecified quadrant: Secondary | ICD-10-CM

## 2021-07-02 ENCOUNTER — Other Ambulatory Visit: Payer: Self-pay | Admitting: Family Medicine

## 2021-07-02 DIAGNOSIS — F411 Generalized anxiety disorder: Secondary | ICD-10-CM

## 2021-07-03 ENCOUNTER — Other Ambulatory Visit: Payer: Self-pay | Admitting: Family Medicine

## 2021-07-03 DIAGNOSIS — F411 Generalized anxiety disorder: Secondary | ICD-10-CM

## 2021-07-03 NOTE — Telephone Encounter (Signed)
Requested medication (s) are due for refill today: yes  Requested medication (s) are on the active medication list: yes  Last refill:  04/03/2021  Future visit scheduled: no  Notes to clinic:  overdue for 6 month follow up  Message has been sent to patient to contact office   Requested Prescriptions  Pending Prescriptions Disp Refills   PARoxetine (PAXIL) 20 MG tablet [Pharmacy Med Name: PARoxetine HCl 20 MG Oral Tablet] 90 tablet 0    Sig: TAKE 1 TABLET BY MOUTH ONCE DAILY. SCHEDULE FOLLOW UP APPOINTMENT AND FASTING LABS IN THE NEXT 1 TO 2 MONTHS      Psychiatry:  Antidepressants - SSRI Failed - 07/03/2021 10:52 AM      Failed - Valid encounter within last 6 months    Recent Outpatient Visits           7 months ago Diabetic peripheral neuropathy associated with type 2 diabetes mellitus [facility])   [place], Vickki Muff, PA-C   8 months ago Essential (primary) hypertension   [facility] [facility], Vickki Muff, PA-C   1 year ago [place], [place], PA-C   1 year ago Type 2 diabetes mellitus with diabetic neuropathy, without long-term current use of insulin [facility])   [place], Vickki Muff, PA-C   1 year ago Type 2 diabetes mellitus with diabetic neuropathy, without long-term current use of insulin [facility])   [facility] [place], [place], [place]

## 2021-07-05 NOTE — Telephone Encounter (Signed)
Ordered on 06-29-21.

## 2021-07-07 NOTE — Telephone Encounter (Signed)
Patient was advised.  

## 2021-07-14 ENCOUNTER — Ambulatory Visit
Admission: RE | Admit: 2021-07-14 | Discharge: 2021-07-14 | Disposition: A | Discharge status: Home / Self Care | Payer: 59 | Source: Ambulatory Visit | Attending: Family Medicine | Admitting: Family Medicine

## 2021-07-14 ENCOUNTER — Ambulatory Visit (INDEPENDENT_AMBULATORY_CARE_PROVIDER_SITE_OTHER): Payer: 59 | Admitting: Family Medicine

## 2021-07-14 ENCOUNTER — Other Ambulatory Visit: Payer: Self-pay

## 2021-07-14 ENCOUNTER — Encounter: Payer: Self-pay | Admitting: Family Medicine

## 2021-07-14 VITALS — BP 153/75 | HR 75 | Temp 98.4°F | Ht 64.0 in | Wt 191.2 lb

## 2021-07-14 DIAGNOSIS — E039 Hypothyroidism, unspecified: Secondary | ICD-10-CM | POA: Diagnosis not present

## 2021-07-14 DIAGNOSIS — N6320 Unspecified lump in the left breast, unspecified quadrant: Secondary | ICD-10-CM | POA: Insufficient documentation

## 2021-07-14 DIAGNOSIS — E782 Mixed hyperlipidemia: Secondary | ICD-10-CM

## 2021-07-14 DIAGNOSIS — I10 Essential (primary) hypertension: Secondary | ICD-10-CM

## 2021-07-14 DIAGNOSIS — I350 Nonrheumatic aortic (valve) stenosis: Secondary | ICD-10-CM

## 2021-07-14 DIAGNOSIS — E119 Type 2 diabetes mellitus without complications: Secondary | ICD-10-CM

## 2021-07-14 NOTE — Progress Notes (Signed)
Established patient visit   Patient: Emily Tapia   DOB: 10/26/1961   60 y.o. Female  MRN: [id number] Visit Date: 07/14/2021  Today's healthcare provider: Vernie Murders, PA-C   No chief complaint on file.  Subjective    HPI  LOV 12/05/20 Last had lab 10/15/20 Have not eaten this morning but had coffee Checks sugar at home every once in a while.mainly checks in morning if she does. Watching salt and sugar intake when eating Stopped walking about a month ago due to weather. Sleeping fair.    Patient Active Problem List   Diagnosis Date Noted   Pelvic mass 07/08/2015   Hyperlipidemia, mixed 07/08/2015   Hx of iron deficiency 07/08/2015   Absolute anemia 04/27/2015   Diabetic peripheral neuropathy associated with type 2 diabetes mellitus ([place]) 04/27/2015   Breath shortness 04/27/2015   Accumulation of fluid in tissues 04/27/2015   Cardiac murmur 04/27/2015   HLD (hyperlipidemia) 04/27/2015   Decreased potassium in the blood 04/27/2015   Hypothyroidism 04/27/2015   Obstructive sleep apnea 04/27/2015   Disorder of peripheral nervous system 04/27/2015   Obstructive apnea 04/27/2015   Pain in shoulder 04/27/2015   Diabetes mellitus, type 2 ([place]) 04/27/2015   Fibroid uterus 10/25/2013   Aortic valve stenosis 05/01/2011   Edema 03/27/2011   SOB (shortness of breath) 03/27/2011   Diabetes mellitus ([place]) 03/27/2011   HTN (hypertension) 03/27/2011   Essential (primary) hypertension 07/05/2006   Anxiety, generalized 07/05/2006   Past Medical History:  Diagnosis Date   Anxiety    Aortic stenosis    Diabetes mellitus    Edema    Heart murmur    Hypertension    Sleep apnea    Thyroid disease    No Known Allergies     Medications: Outpatient Medications Prior to Visit  Medication Sig   Ascorbic Acid (VITAMIN C PO) Take by mouth daily.   aspirin 81 MG EC tablet Take 81 mg by mouth at bedtime.   carvedilol (COREG) 25 MG tablet Take 1 tablet (25 mg  total) by mouth 2 (two) times daily.   Cholecalciferol (VITAMIN D3) 1000 UNITS CAPS Take 1 capsule by mouth every morning.   Dulaglutide (TRULICITY) 3 0000000 SOPN Inject 3 mg as directed once a week.   EUTHYROX 75 MCG tablet TAKE 1 TABLET BY MOUTH ONCE DAILY BEFORE BREAKFAST .OFFICE  VIST  NEEDED  BEFORE  NEXT  REFILL  IS  DUE   Ferrous Sulfate (IRON) 325 (65 Fe) MG TABS Take 1 tablet by mouth once daily with breakfast   furosemide (LASIX) 40 MG tablet TAKE 1 TABLET BY MOUTH ONCE DAILY AS NEEDED FOR FLUID OR EDEMA   glucose blood (CONTOUR NEXT TEST) test strip Test fasting sugar each morning. Recheck if having hypoglycemic symptoms.   glyBURIDE (DIABETA) 5 MG tablet TAKE 1 TABLET BY MOUTH TWICE DAILY WITH MEALS.   hydrochlorothiazide (HYDRODIURIL) 25 MG tablet Take 1 tablet (25 mg total) by mouth daily.   lisinopril (ZESTRIL) 40 MG tablet Take 1 tablet (40 mg total) by mouth daily.   meclizine (ANTIVERT) 25 MG tablet Take 1 tablet (25 mg total) by mouth 3 (three) times daily as needed for dizziness.   metFORMIN (GLUCOPHAGE) 1000 MG tablet Take 1 tablet (1,000 mg total) by mouth 2 (two) times daily with a meal.   Multiple Vitamin (MULTIVITAMIN) tablet Take 1 tablet by mouth every morning.   Multiple Vitamins-Minerals (ZINC PO) Take by mouth daily.   PARoxetine (  PAXIL) 20 MG tablet TAKE 1 TABLET BY MOUTH ONCE DAILY. SCHEDULE FOLLOW UP APPOINTMENT AND FASTING LABS IN THE NEXT 1 TO 2 MONTHS   No facility-administered medications prior to visit.    Review of Systems  Constitutional: Negative.   HENT: Negative.    Respiratory: Negative.    Cardiovascular: Negative.   Gastrointestinal: Negative.   Genitourinary: Negative.       Objective    BP (!) 153/75   Pulse 75   Temp 98.4 F (36.9 C) (Oral)   Ht '5\' 4"'$  (1.626 m)   Wt 191 lb 3.2 oz (86.7 kg)   SpO2 98%   BMI 32.82 kg/m  BP Readings from Last 3 Encounters:  03/20/21 132/62  12/05/20 (!) 143/69  11/28/20 (!) 148/76   Wt  Readings from Last 3 Encounters: 03/20/21 188 lb (85.3 kg) 12/05/20 192 lb (87.1 kg) 11/28/20 194 lb (88 kg)    Physical Exam Constitutional:      General: She is not in acute distress.    Appearance: She is well-developed.  HENT:     Head: Normocephalic and atraumatic.     Right Ear: Hearing normal.     Left Ear: Hearing normal.     Nose: Nose normal.  Eyes:     General: Lids are normal. No scleral icterus.       Right eye: No discharge.        Left eye: No discharge.     Conjunctiva/sclera: Conjunctivae normal.  Cardiovascular:     Rate and Rhythm: Normal rate and regular rhythm.     Heart sounds: Murmur heard.     Comments: Grade 3/6 systolic murmur radiating into carotids bilaterally. Pulmonary:     Effort: Pulmonary effort is normal. No respiratory distress.     Breath sounds: Normal breath sounds.  Abdominal:     General: Bowel sounds are normal.     Palpations: Abdomen is soft.  Musculoskeletal:        General: Normal range of motion.  Skin:    Findings: No lesion or rash.  Neurological:     Mental Status: She is alert and oriented to person, place, and time.  Psychiatric:        Speech: Speech normal.        Behavior: Behavior normal.        Thought Content: Thought content normal.    Diabetic Foot Form - Detailed   Diabetic Foot Exam - detailed Diabetic Foot exam was performed with the following findings: Yes 07/14/2021  9:29 AM  Visual Foot Exam completed.: Yes  Can the patient see the bottom of their feet?: Yes Are the shoes appropriate in style and fit?: Yes Is there swelling or and abnormal foot shape?: No Is there a claw toe deformity?: No Is there elevated skin temparature?: No Is there foot or ankle muscle weakness?: No Normal Range of Motion: Yes Pulse Foot Exam completed.: Yes   Right posterior Tibialias: Present Left posterior Tibialias: Present   Right Dorsalis Pedis: Present Left Dorsalis Pedis: Present  Sensory Foot Exam Completed.:  Yes Semmes-Weinstein Monofilament Test R Site 1-Great Toe: Pos L Site 1-Great Toe: Pos     Comments: Mild tingling and occasional burning in the bottom of each foot.       No results found for any visits on 07/14/21.  Assessment & Plan    1. Essential (primary) hypertension Fair control with Lisinopril, Coreg and HCTZ. Recheck labs and maintain follow up with cardiologist (Dr. [name]) -  CBC with Differential/Platelet - Comprehensive metabolic panel - Lipid Panel With LDL/HDL Ratio - TSH  2. Mixed hyperlipidemia Trying to follow low fat diabetic diet. Not ready to try statins yet. Recheck labs. - CBC with Differential/Platelet - Comprehensive metabolic panel - Lipid Panel With LDL/HDL Ratio - TSH  3. Hypothyroidism, unspecified type Still on Euthrox 75 mcg qd. No tremor, brittle nails, hair loss or significant heat intolerance. Recheck labs. - CBC with Differential/Platelet - Comprehensive metabolic panel - TSH  4. Type 2 diabetes mellitus without complication, without long-term current use of insulin (HCC) FBS in the 125-130 range at home recently. Still on Trulicity 3 mg q week, Glyburide 5 mg BID and Metformin 1000 mg BID. Recheck labs (10-14-20 Hgb A1C was 12.7). Had eye exam earlier this year and got new glasses. Foot exam accomplished with minimal neuropathy. - CBC with Differential/Platelet - Comprehensive metabolic panel - HgB 123456 - Lipid Panel With LDL/HDL Ratio  5. Aortic valve stenosis, etiology of cardiac valve disease unspecified Followed by cardiologist on 03-20-21 (Dr. [name]). No chest pains or dyspnea.   No follow-ups on file.      I, Clemon Devaul, PA-C, have reviewed all documentation for this visit. The documentation on 07/14/21 for the exam, diagnosis, procedures, and orders are all accurate and complete.    Vernie Murders, PA-C  [employer] [phone] (phone) [phone] (fax)  [place]

## 2021-07-15 LAB — COMPREHENSIVE METABOLIC PANEL
ALT: 32 IU/L (ref 0–32)
AST: 24 IU/L (ref 0–40)
Albumin/Globulin Ratio: 2.1 (ref 1.2–2.2)
Albumin: 4.6 g/dL (ref 3.8–4.8)
Alkaline Phosphatase: 63 IU/L (ref 44–121)
BUN/Creatinine Ratio: 22 (ref 12–28)
BUN: 13 mg/dL (ref 8–27)
Bilirubin Total: 0.4 mg/dL (ref 0.0–1.2)
CO2: 26 mmol/L (ref 20–29)
Calcium: 9.4 mg/dL (ref 8.7–10.3)
Chloride: 99 mmol/L (ref 96–106)
Creatinine, Ser: 0.59 mg/dL (ref 0.57–1.00)
Globulin, Total: 2.2 g/dL (ref 1.5–4.5)
Glucose: 119 mg/dL — ABNORMAL HIGH (ref 65–99)
Potassium: 3.5 mmol/L (ref 3.5–5.2)
Sodium: 142 mmol/L (ref 134–144)
Total Protein: 6.8 g/dL (ref 6.0–8.5)
eGFR: 102 mL/min/{1.73_m2} (ref >59)

## 2021-07-15 LAB — CBC WITH DIFFERENTIAL/PLATELET
Basophils Absolute: 0 10*3/uL (ref 0.0–0.2)
Basos: 1 %
EOS (ABSOLUTE): 0.2 10*3/uL (ref 0.0–0.4)
Eos: 2 %
Hematocrit: 39.7 % (ref 34.0–46.6)
Hemoglobin: 12.6 g/dL (ref 11.1–15.9)
Immature Grans (Abs): 0 10*3/uL (ref 0.0–0.1)
Immature Granulocytes: 0 %
Lymphocytes Absolute: 2.2 10*3/uL (ref 0.7–3.1)
Lymphs: 35 %
MCH: 25.5 pg — ABNORMAL LOW (ref 26.6–33.0)
MCHC: 31.7 g/dL (ref 31.5–35.7)
MCV: 80 fL (ref 79–97)
Monocytes Absolute: 0.4 10*3/uL (ref 0.1–0.9)
Monocytes: 5 %
Neutrophils Absolute: 3.7 10*3/uL (ref 1.4–7.0)
Neutrophils: 57 %
Platelets: 130 10*3/uL — ABNORMAL LOW (ref 150–450)
RBC: 4.95 x10E6/uL (ref 3.77–5.28)
RDW: 14.6 % (ref 11.7–15.4)
WBC: 6.5 10*3/uL (ref 3.4–10.8)

## 2021-07-15 LAB — LIPID PANEL WITH LDL/HDL RATIO
Cholesterol, Total: 183 mg/dL (ref 100–199)
HDL: 38 mg/dL — ABNORMAL LOW (ref >39)
LDL Chol Calc (NIH): 120 mg/dL — ABNORMAL HIGH (ref 0–99)
LDL/HDL Ratio: 3.2 ratio (ref 0.0–3.2)
Triglycerides: 142 mg/dL (ref 0–149)
VLDL Cholesterol Cal: 25 mg/dL (ref 5–40)

## 2021-07-15 LAB — HEMOGLOBIN A1C
Est. average glucose Bld gHb Est-mCnc: 140 mg/dL
Hgb A1c MFr Bld: 6.5 % — ABNORMAL HIGH (ref 4.8–5.6)

## 2021-07-15 LAB — TSH: TSH: 2.54 u[IU]/mL (ref 0.450–4.500)

## 2021-07-17 ENCOUNTER — Other Ambulatory Visit: Payer: Self-pay | Admitting: Family Medicine

## 2021-07-17 DIAGNOSIS — R928 Other abnormal and inconclusive findings on diagnostic imaging of breast: Secondary | ICD-10-CM

## 2021-07-17 DIAGNOSIS — N6320 Unspecified lump in the left breast, unspecified quadrant: Secondary | ICD-10-CM

## 2021-07-17 DIAGNOSIS — R921 Mammographic calcification found on diagnostic imaging of breast: Secondary | ICD-10-CM

## 2021-07-18 ENCOUNTER — Other Ambulatory Visit: Payer: Self-pay | Admitting: Family Medicine

## 2021-07-18 DIAGNOSIS — R921 Mammographic calcification found on diagnostic imaging of breast: Secondary | ICD-10-CM

## 2021-07-20 ENCOUNTER — Ambulatory Visit
Admission: RE | Admit: 2021-07-20 | Discharge: 2021-07-20 | Disposition: A | Discharge status: Home / Self Care | Payer: 59 | Source: Ambulatory Visit | Attending: Family Medicine | Admitting: Family Medicine

## 2021-07-20 ENCOUNTER — Other Ambulatory Visit: Payer: Self-pay

## 2021-07-20 DIAGNOSIS — R928 Other abnormal and inconclusive findings on diagnostic imaging of breast: Secondary | ICD-10-CM

## 2021-07-20 DIAGNOSIS — R921 Mammographic calcification found on diagnostic imaging of breast: Secondary | ICD-10-CM

## 2021-07-20 DIAGNOSIS — N6320 Unspecified lump in the left breast, unspecified quadrant: Secondary | ICD-10-CM

## 2021-07-20 HISTORY — PX: BREAST BIOPSY: SHX20

## 2021-07-21 ENCOUNTER — Other Ambulatory Visit: Payer: Self-pay | Admitting: Family Medicine

## 2021-07-21 DIAGNOSIS — D0512 Intraductal carcinoma in situ of left breast: Secondary | ICD-10-CM

## 2021-07-21 LAB — SURGICAL PATHOLOGY

## 2021-07-23 ENCOUNTER — Other Ambulatory Visit: Payer: Self-pay | Admitting: Family Medicine

## 2021-07-23 DIAGNOSIS — F411 Generalized anxiety disorder: Secondary | ICD-10-CM

## 2021-07-24 ENCOUNTER — Encounter: Payer: Self-pay | Admitting: *Deleted

## 2021-07-24 ENCOUNTER — Other Ambulatory Visit: Payer: Self-pay | Admitting: Family Medicine

## 2021-07-24 DIAGNOSIS — D0512 Intraductal carcinoma in situ of left breast: Secondary | ICD-10-CM

## 2021-07-24 MED ORDER — PAROXETINE HCL 20 MG PO TABS: 20.0000 mg | ORAL_TABLET | Freq: Every day | ORAL | 2 refills | Status: DC

## 2021-07-24 NOTE — Progress Notes (Signed)
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 I have her scheduled to see Dr. Christian Mate tomorrow at 2:00 and Dr. Grayland Ormond on Thursday at 1:30.  Will give educational literature at that time.

## 2021-07-25 ENCOUNTER — Encounter: Payer: Self-pay | Admitting: Surgery

## 2021-07-25 ENCOUNTER — Other Ambulatory Visit: Payer: Self-pay

## 2021-07-25 ENCOUNTER — Ambulatory Visit (INDEPENDENT_AMBULATORY_CARE_PROVIDER_SITE_OTHER): Payer: 59 | Admitting: Surgery

## 2021-07-25 ENCOUNTER — Ambulatory Visit: Payer: Self-pay | Admitting: Surgery

## 2021-07-25 VITALS — BP 137/82 | HR 71 | Temp 98.6°F | Ht 64.0 in | Wt 189.0 lb

## 2021-07-25 DIAGNOSIS — C50412 Malignant neoplasm of upper-outer quadrant of left female breast: Secondary | ICD-10-CM | POA: Diagnosis not present

## 2021-07-25 NOTE — Patient Instructions (Addendum)
Our surgery scheduler will call you within 24-48 hours to schedule your surgery. Please have the [facility] surgery sheet available when speaking with her.    Implanted [facility] Guide An implanted port is a device that is placed under the skin. It is usually placed in the chest. The device can be used to give IV medicine, to take blood, or for dialysis. You may have an implanted port if: You need IV medicine that would be irritating to the small veins in your hands or arms. You need IV medicines, such as antibiotics, for a long period of time. You need IV nutrition for a long period of time. You need dialysis. When you have a port, your health care provider can choose to use the port instead of veins in your arms for these procedures. You may have fewer limitations when using a port than you would if you used other types of long-term IVs, and you will likely be able to return to normal activities afteryour incision heals. An implanted port has two main parts: Reservoir. The reservoir is the part where a needle is inserted to give medicines or draw blood. The reservoir is round. After it is placed, it appears as a small, raised area under your skin. Catheter. The catheter is a thin, flexible tube that connects the reservoir to a vein. Medicine that is inserted into the reservoir goes into the catheter and then into the vein. How is my port accessed? To access your port: A numbing cream may be placed on the skin over the port site. Your health care provider will put on a mask and sterile gloves. The skin over your port will be cleaned carefully with a germ-killing soap and allowed to dry. Your health care provider will gently pinch the port and insert a needle into it. Your health care provider will check for a blood return to make sure the port is in the vein and is not clogged. If your port needs to remain accessed to get medicine continuously (constant infusion), your health care provider will place  a clear bandage (dressing) over the needle site. The dressing and needle will need to be changed every week, or as told by your health care provider. What is flushing? Flushing helps keep the port from getting clogged. Follow instructions from your health care provider about how and when to flush the port. Ports are usually flushed with saline solution or a medicine called heparin. The need for flushing will depend on how the port is used: If the port is only used from time to time to give medicines or draw blood, the port may need to be flushed: Before and after medicines have been given. Before and after blood has been drawn. As part of routine maintenance. Flushing may be recommended every 4-6 weeks. If a constant infusion is running, the port may not need to be flushed. Throw away any syringes in a disposal container that is meant for sharp items (sharps container). You can buy a sharps container from a pharmacy, or you can make one by using an empty hard plastic bottle with a cover. How long will my port stay implanted? The port can stay in for as long as your health care provider thinks it is needed. When it is time for the port to come out, a surgery will be done to remove it. The surgery will be similar to the procedure that was done to putthe port in. Follow these instructions at home:  Flush your  port as told by your health care provider. If you need an infusion over several days, follow instructions from your health care provider about how to take care of your port site. Make sure you: Wash your hands with soap and water before you change your dressing. If soap and water are not available, use alcohol-based hand sanitizer. Change your dressing as told by your health care provider. Place any used dressings or infusion bags into a plastic bag. Throw that bag in the trash. Keep the dressing that covers the needle clean and dry. Do not get it wet. Do not use scissors or sharp objects near  the tube. Keep the tube clamped, unless it is being used. Check your port site every day for signs of infection. Check for: Redness, swelling, or pain. Fluid or blood. Pus or a bad smell. Protect the skin around the port site. Avoid wearing bra straps that rub or irritate the site. Protect the skin around your port from seat belts. Place a soft pad over your chest if needed. Bathe or shower as told by your health care provider. The site may get wet as long as you are not actively receiving an infusion. Return to your normal activities as told by your health care provider. Ask your health care provider what activities are safe for you. Carry a medical alert card or wear a medical alert bracelet at all times. This will let health care providers know that you have an implanted port in case of an emergency. Get help right away if: You have redness, swelling, or pain at the port site. You have fluid or blood coming from your port site. You have pus or a bad smell coming from the port site. You have a fever. Summary Implanted ports are usually placed in the chest for long-term IV access. Follow instructions from your health care provider about flushing the port and changing bandages (dressings). Take care of the area around your port by avoiding clothing that puts pressure on the area, and by watching for signs of infection. Protect the skin around your port from seat belts. Place a soft pad over your chest if needed. Get help right away if you have a fever or you have redness, swelling, pain, drainage, or a bad smell at the port site. This information is not intended to replace advice given to you by your health care provider. Make sure you discuss any questions you have with your healthcare provider. Document Revised: 04/18/2020 Document Reviewed: 04/18/2020 Elsevier Patient Education  2022 [place].    Skin Sparing Mastectomy A mastectomy is a surgery to remove a breast. Skin sparing  mastectomy (SSM) is a technique that can be used during a mastectomy. When this technique is used, all of the breast skin, except the areola and the nipple, is left intact. The technique allows the breast to be reconstructed during the surgery. Most womenwith breast cancer can have this type of surgery if needed. Tell a health care provider about: Any allergies you have. All medicines you are taking, including vitamins, herbs, eye drops, creams, and over-the-counter medicines. Any problems you or family members have had with anesthetic medicines. Any blood disorders you have. Any surgeries you have had. Any medical conditions you have. Whether you are pregnant or may be pregnant. What are the risks? Generally, this is a safe procedure. However, problems may occur, including: Infection. Bleeding. Allergic reactions to medicines or dyes. Blood clots. Death of tissue (necrosis). People who use tobacco products are  at increased risk for this problem. The formation of a pocket of clear fluid (seroma). A skin infection caused by bacteria (cellulitis). Nerve damage due to poor positioning of the arm during surgery. Damage to nearby structures or organs. A collapsed lung (pneumothorax). This is rare. What happens before the procedure? Staying hydrated Follow instructions from your health care provider about hydration, which may include: Up to 2 hours before the procedure - you may continue to drink clear liquids, such as water, clear fruit juice, black coffee, and plain tea.  Eating and drinking restrictions Follow instructions from your health care provider about eating and drinking, which may include: 8 hours before the procedure - stop eating heavy meals or foods, such as meat, fried foods, or fatty foods. 6 hours before the procedure - stop eating light meals or foods, such as toast or cereal. 6 hours before the procedure - stop drinking milk or drinks that contain milk. 2 hours before the  procedure - stop drinking clear liquids. Medicines Ask your health care provider about: Changing or stopping your regular medicines. This is especially important if you are taking diabetes medicines or blood thinners. Taking medicines such as aspirin and ibuprofen. These medicines can thin your blood. Do not take these medicines unless your health care provider tells you to take them. Taking over-the-counter medicines, vitamins, herbs, and supplements General instructions You may be checked for abnormal lymph nodes in the area around the affected breast. Do not use any products that contain nicotine or tobacco for at least 4 weeks before the procedure. These products include cigarettes, e-cigarettes, and chewing tobacco. If you need help quitting, ask your health care provider. Ask your health care provider: How your surgery site will be marked. What steps will be taken to help prevent infection. These may include: Washing skin with a germ-killing soap. Taking antibiotic medicine. You may be asked to shower with a germ-killing soap. Plan to have someone take you home from the hospital or clinic. If you will be going home right after the procedure, plan to have someone with you for 24 hours. What happens during the procedure?  An IV will be inserted into one of your veins. You will be given one or more of the following: A medicine to help you relax (sedative). A medicine to numb the area (local anesthetic). A medicine to make you fall asleep (general anesthetic). An incision will be made. Breast tissue, including the areola and the nipple, will be removed. Tissue from the area may be sent to be examined under a microscope to look for cancer cells (biopsy). Lymph nodes in the area may be checked for cancer and removed if necessary. Skin around the breast will be preserved to form a pocket with flaps that will be developed to insert a breast implant. An implant may be inserted into the  pocket made by the flaps. A tube will be placed to drain blood and fluids that collect in the surgical area. The incision will be closed with sutures (stitches), skin glue, or skin adhesive strips. A gauze bandage (dressing) will be put over the incision. The procedure may vary among health care providers and hospitals. What happens after the procedure? Your blood pressure, heart rate, breathing rate, and blood oxygen level will be monitored until you leave the hospital or clinic. You will be given pain medicine as needed. You may stay at the hospital at least overnight so that your health care providers can make sure you are: Able to  tolerate pain-relieving medicines and other medicines. Able to eat. Hydrated. You will be told how to care for your drain and incisions at home. Do not drive until your health care provider says that it is safe. Summary Skin sparing mastectomy [facility]) is a technique that allows the breast to be reconstructed during a surgery that is done to remove a breast (mastectomy). For the procedure, you will be given a medicine to make you fall asleep (general anesthetic). You may need to stay in the hospital at least overnight after the procedure. This information is not intended to replace advice given to you by your health care provider. Make sure you discuss any questions you have with your healthcare provider. Document Revised: 09/25/2019 Document Reviewed: 09/25/2019 Elsevier Patient Education  [place].

## 2021-07-25 NOTE — H&P (View-Only) (Signed)
Patient ID: Emily Tapia, female   DOB: 02/20/1961, 60 y.o.   MRN: [id number]  Chief Complaint: Left breast invasive cancer with DCIS.  History of Present Illness Emily Tapia is a 60 y.o. female with a left breast mass that under imaging identified suspicious microcalcifications covering an area of about 10 cm.  2 stereotactic biopsies confirmed DCIS.  An ultrasound defined a mass and core biopsy of it confirmed an invasive mammary carcinoma.  These were all located in the upper outer quadrant left breast where her presenting mass occurred.  She had no overlying skin changes.  No axillary mass.  No nipple discharge.  She does have a family history of breast cancer in maternal grandmother.  She has a half sister but is unaware of her history.  She underwent menses at the age of 78 and she has been pregnant 3 times with the first child delivered at her age of 51.  Her last mammogram was about 6 years ago.  She did have some similar calcifications in the right breast, but a lumpectomy/excisional biopsy was done years ago, and the pathology from right breast was consistent with fat necrosis without evidence of any atypia or suspicion for cancer.  Past Medical History Past Medical History:  Diagnosis Date   Anxiety    Aortic stenosis    Diabetes mellitus    Edema    Heart murmur    Hypertension    Sleep apnea    Thyroid disease       Past Surgical History:  Procedure Laterality Date   ABDOMINAL HYSTERECTOMY  11/17/2013   BREAST BIOPSY     x2   BREAST BIOPSY Left 07/20/2021   [place] Bx, Ribbon Clip, Path pending   BREAST BIOPSY Left 07/20/2021   Stereo Bx, X-clip, path pending   BREAST BIOPSY Left 07/20/2021   Stereo biopsy, coil clip, path pending   BREAST BIOPSY Right 07/20/2021   Stereo Bx, Ribbon Clip, path pending   CESAREAN SECTION     x 2   CHOLECYSTECTOMY  12/17/2001   DUE TO STONES   DILATION AND CURETTAGE OF UTERUS     ELBOW SURGERY     POLYPECTOMY   12/17/1993    No Known Allergies  Current Outpatient Medications  Medication Sig Dispense Refill   Ascorbic Acid (VITAMIN C PO) Take by mouth daily.     aspirin 81 MG EC tablet Take 81 mg by mouth at bedtime.     Cholecalciferol (VITAMIN D3) 1000 UNITS CAPS Take 1 capsule by mouth every morning.     Dulaglutide (TRULICITY) 3 XB/3.5HG SOPN Inject 3 mg as directed once a week. 2 mL 3   EUTHYROX 75 MCG tablet TAKE 1 TABLET BY MOUTH ONCE DAILY BEFORE BREAKFAST .OFFICE  VIST  NEEDED  BEFORE  NEXT  REFILL  IS  DUE 90 tablet 3   Ferrous Sulfate (IRON) 325 (65 Fe) MG TABS Take 1 tablet by mouth once daily with breakfast 90 tablet 0   furosemide (LASIX) 40 MG tablet TAKE 1 TABLET BY MOUTH ONCE DAILY AS NEEDED FOR FLUID OR EDEMA 90 tablet 1   glucose blood (CONTOUR NEXT TEST) test strip Test fasting sugar each morning. Recheck if having hypoglycemic symptoms. 100 each 4   glyBURIDE (DIABETA) 5 MG tablet TAKE 1 TABLET BY MOUTH TWICE DAILY WITH MEALS. 90 tablet 3   hydrochlorothiazide (HYDRODIURIL) 25 MG tablet Take 1 tablet (25 mg total) by mouth daily. 90 tablet 1   lisinopril (ZESTRIL)  40 MG tablet Take 1 tablet (40 mg total) by mouth daily. 90 tablet 1   meclizine (ANTIVERT) 25 MG tablet Take 1 tablet (25 mg total) by mouth 3 (three) times daily as needed for dizziness. 30 tablet 0   metFORMIN (GLUCOPHAGE) 1000 MG tablet Take 1 tablet (1,000 mg total) by mouth 2 (two) times daily with a meal. 180 tablet 3   Multiple Vitamin (MULTIVITAMIN) tablet Take 1 tablet by mouth every morning.     Multiple Vitamins-Minerals (ZINC PO) Take by mouth daily.     PARoxetine (PAXIL) 20 MG tablet Take 1 tablet (20 mg total) by mouth daily. 30 tablet 2   carvedilol (COREG) 25 MG tablet Take 1 tablet (25 mg total) by mouth 2 (two) times daily. 180 tablet 3   No current facility-administered medications for this visit.    Family History Family History  Problem Relation Age of Onset   Hyperlipidemia Mother     Thyroid disease Mother    Hashimoto's thyroiditis Mother    Heart disease Father        open heart surgery   Heart failure Father    Diabetes Father    Dementia Father    Thyroid disease Brother    Breast cancer Maternal Grandmother    Diabetes Paternal Grandmother    Prostate cancer Paternal Grandfather       Social History Social History   Tobacco Use   Smoking status: Former    Packs/day: 1.50    Years: 25.00    Pack years: 37.50    Types: Cigarettes    Quit date: 11/24/2001    Years since quitting: 19.6   Smokeless tobacco: Never  Vaping Use   Vaping Use: Never used  Substance Use Topics   Alcohol use: Yes    Alcohol/week: 0.0 standard drinks    Comment: OCCASIONALLY DRINKS WINE, ONCE A WEEK   Drug use: No        Review of Systems  Constitutional: Negative.   HENT: Negative.    Eyes: Negative.   Respiratory: Negative.    Cardiovascular:  Positive for palpitations and leg swelling.  Gastrointestinal: Negative.   Genitourinary: Negative.   Skin: Negative.   Neurological:  Positive for dizziness (Vertigo).  Psychiatric/Behavioral: Negative.       Physical Exam Blood pressure 137/82, pulse 71, temperature 98.6 F (37 C), temperature source Oral, height $RemoveBefo'5\' 4"'CoQvimPIruY$  (1.626 m), weight 189 lb (85.7 kg), SpO2 94 %. Last Weight  Most recent update: 07/25/2021  2:05 PM    Weight  85.7 kg (189 lb)             CONSTITUTIONAL: Well developed, and nourished, appropriately responsive and aware without distress.  EYES: Sclera non-icteric.   EARS, NOSE, MOUTH AND THROAT: Mask worn.   The oropharynx is clear. Oral mucosa is pink and moist.     Hearing is intact to voice.  NECK: Trachea is midline, and there is no jugular venous distension.  LYMPH NODES:  Lymph nodes in the neck are not enlarged. RESPIRATORY:  Lungs are clear, and breath sounds are equal bilaterally. Normal respiratory effort without pathologic use of accessory muscles. CARDIOVASCULAR: Heart is regular in  rate and rhythm. GI: The abdomen is soft, nontender, and nondistended. There were no palpable masses. I did not appreciate hepatosplenomegaly. There were normal bowel sounds. GU: Mild asymmetry with left breast slightly larger than right.  Skin changes consistent with ecchymosis following biopsies of both breasts.  Multiple areas on the left,  1 on the right.  No suspicious dermal changes, peau d'orange, dimpling or tethering on exam. MUSCULOSKELETAL:  Symmetrical muscle tone appreciated in all four extremities.    SKIN: Skin turgor is normal. No pathologic skin lesions appreciated.  NEUROLOGIC:  Motor and sensation appear grossly normal.  Cranial nerves are grossly without defect. PSYCH:  Alert and oriented to person, place and time. Affect is appropriate for situation.  Data Reviewed I have personally reviewed what is currently available of the patient's imaging, recent labs and medical records.   Labs:  CBC Latest Ref Rng & Units 07/14/2021 10/14/2020 01/02/2020  WBC 3.4 - 10.8 x10E3/uL 6.5 5.1 7.5  Hemoglobin 11.1 - 15.9 g/dL 12.6 13.1 13.1  Hematocrit 34.0 - 46.6 % 39.7 42.2 42.1  Platelets 150 - 450 x10E3/uL 130(L) 137(L) 137(L)   CMP Latest Ref Rng & Units 07/14/2021 10/14/2020 01/02/2020  Glucose 65 - 99 mg/dL 119(H) 391(H) 192(H)  BUN 8 - 27 mg/dL $Remove'13 16 15  'AcoMKiK$ Creatinine 0.57 - 1.00 mg/dL 0.59 0.66 0.72  Sodium 134 - 144 mmol/L 142 137 138  Potassium 3.5 - 5.2 mmol/L 3.5 3.8 3.4(L)  Chloride 96 - 106 mmol/L 99 93(L) 99  CO2 20 - 29 mmol/L $RemoveB'26 25 30  'LZqjWomX$ Calcium 8.7 - 10.3 mg/dL 9.4 9.8 9.2  Total Protein 6.0 - 8.5 g/dL 6.8 7.0 -  Total Bilirubin 0.0 - 1.2 mg/dL 0.4 0.6 -  Alkaline Phos 44 - 121 IU/L 63 85 -  AST 0 - 40 IU/L 24 39 -  ALT 0 - 32 IU/L 32 44(H) -   SURGICAL PATHOLOGY  CASE: [id number]  PATIENT: Enzo Montgomery  Surgical Pathology Report   Specimen Submitted:  A. Breast mass, left 1:00 6 CMFN   Clinical History: Highly suspicious 5.1 cm mass in left breast at the 1   o'clock axis; ribbon clip deployed.  Patient also has highly suspicious  calcifications in the UOQ of left breast for which stereo BX's will be  performed later today (with separate path orders).   DIAGNOSIS:  A. BREAST MASS, LEFT 1:00 6 CM FN; ULTRASOUND-GUIDED BIOPSY (RIBBON  CLIP):  - INVASIVE MAMMARY CARCINOMA, NO SPECIAL TYPE.   Size of invasive carcinoma: 3 mm in this sample  Histologic grade of invasive carcinoma: Grade 2                       Glandular/tubular differentiation score: 3                       Nuclear pleomorphism score: 2                       Mitotic rate score: 1                       Total score: 6  Ductal carcinoma in situ: Present, high-grade comedo type  Lymphovascular invasion: Not identified   ER/PR/HER2: Immunohistochemistry will be performed on block A1, with  reflex to [place] for HER2 2+. The results will be reported in an addendum.   SURGICAL PATHOLOGY  CASE: [id number]  PATIENT: Enzo Montgomery  Surgical Pathology Report   Specimen Submitted:  A. Breast, left upper outer posterior  B. Breast, left upper outer anterior  C. Breast, right upper outer quadrant   Clinical History: Highly suspicious calcifications spanning 10 cm in the  upper outer left breast. A: sampling of posterior aspect of  calcifications in  left UOQ with X clip deployed. B. sampling of anterior  aspect of calcifications in left UOQ with coil clip deployed. C.  indeterminate calcifications in upper outer right breast at site of  previous surgical excision, ribbon clip deployed.   DIAGNOSIS:  A. BREAST CALCIFICATIONS, LEFT UPPER OUTER POSTERIOR; STEREOTACTIC  BIOPSY (X CLIP):  - HIGH-GRADE DUCTAL CARCINOMA IN SITU (DCIS), COMEDO TYPE WITH  ASSOCIATED CALCIFICATIONS.  - FOCUS SUSPICIOUS FOR MICRO INVASIVE CARCINOMA, SEE COMMENT.   B.  BREAST CALCIFICATIONS, LEFT UPPER OUTER ANTERIOR; STEREOTACTIC  BIOPSY (COIL CLIP):  - HIGH-GRADE DUCTAL CARCINOMA IN SITU (DCIS), COMEDO  TYPE WITH  ASSOCIATED CALCIFICATIONS.   C.  BREAST CALCIFICATIONS, RIGHT UPPER OUTER; STEREOTACTIC BIOPSY  (RIBBON CLIP):  - PREVIOUS SURGICAL SITE CHANGE WITH ORGANIZING FAT NECROSIS WITH  ASSOCIATED CALCIFICATIONS.  - NEGATIVE FOR ATYPIA AND MALIGNANCY.   Comment:  In specimen A there is a single 0.07 mm focus that is suspicious for  micro invasive carcinoma.  Breast biomarkers will not be performed on the above specimens and have  been ordered on the concurrent left breast biopsy ([id number]) with  invasive mammary carcinoma.    Imaging: Radiology review:   CLINICAL DATA:  60 year old presenting with a palpable lump in the upper LEFT breast over the past 3 weeks associated with LEFT nipple pain and tenderness. Annual evaluation, RIGHT breast. Personal history of remote benign excisional biopsies of the RIGHT breast.   EXAM: DIGITAL DIAGNOSTIC BILATERAL MAMMOGRAM WITH TOMOSYNTHESIS AND CAD; ULTRASOUND LEFT BREAST LIMITED   TECHNIQUE: Bilateral digital diagnostic mammography and breast tomosynthesis was performed. The images were evaluated with computer-aided detection.; Targeted ultrasound examination of the left breast was performed.   COMPARISON:  Previous exams, most recently 11/09/2015.   ACR Breast Density Category b: There are scattered areas of fibroglandular density.   FINDINGS: Full field CC and MLO views of both breasts, a spot compression tangential view of the palpable concern in the LEFT breast, spot magnification CC and mediolateral views of calcifications in both breasts, and a spot tangential view of a mammographic finding in the RIGHT breast were obtained.   RIGHT: Scar/architectural distortion associated with calcifications in the outer breast at middle depth, shown on the spot tangential view to be related to a surgical scar from a prior excisional biopsy. The spot magnification views of the calcifications at the surgical site demonstrate an  approximate 2.1 cm group of fine heterogeneous calcifications. There is an adjacent superficial benign calcified oil cyst underlying the scar, separate from the group.   No suspicious findings elsewhere.   LEFT: Developing asymmetry associated with architectural distortion and suspicious calcifications in the upper breast at anterior to middle depth corresponding to the palpable concern. The calcifications are in a segmental distribution and span approximately 10.0 x 4.2 x 4.7 cm.   Targeted ultrasound is performed, demonstrating an irregular infiltrative hypoechoic mass at the 1 o'clock position approximately 6 cm from nipple, measuring in total approximately 5.1 x 2 1.7 x 2.7 cm, demonstrating posterior acoustic shadowing and demonstrating internal power Doppler flow.   Sonographic evaluation of the LEFT axilla demonstrates no pathologic lymphadenopathy.   On correlative physical examination, there is palpable thickening involving the UPPER OUTER QUADRANT of the LEFT breast.   IMPRESSION: 1. Highly suspicious infiltrative mass measuring approximately 5.1 cm in the UPPER OUTER QUADRANT of the LEFT breast at 1 o'clock 6 cm from the nipple. 2. Associated highly suspicious segmental calcifications spanning approximately 10.0 cm in the upper LEFT breast. 3. No  pathologic LEFT axillary lymphadenopathy. 4. Indeterminate 2.1 cm group of calcifications associated with a surgical scar in the UPPER OUTER QUADRANT of the RIGHT breast. While these may be benign and related to fat necrosis, they are fine heterogeneous in morphology, and therefore indeterminate.   RECOMMENDATION: 1. Ultrasound-guided core needle biopsy of the LEFT breast mass. 2. Stereotactic tomosynthesis core needle biopsy of the anterior and posterior extent of the segmental calcifications in the upper LEFT breast. 3. Stereotactic tomosynthesis core needle biopsy of the RIGHT breast calcifications.   The ultrasound  core needle biopsy and the stereotactic tomosynthesis core needle biopsy procedures were discussed with the patient and her questions were answered. She wishes to proceed with the biopsy. She will be contacted by the Breast Navigator at [facility] in order to schedule the biopsies.   I have discussed the findings and recommendations with the patient.   BI-RADS CATEGORY  5: Highly suggestive of malignancy.     Electronically Signed   By: Evangeline Dakin M.D.   On: 07/14/2021 11:38 Within last 24 hrs: No results found.  Assessment    Left breast cancer, prognostic indicators pending. Patient Active Problem List   Diagnosis Date Noted   Pelvic mass 07/08/2015   Hyperlipidemia, mixed 07/08/2015   Hx of iron deficiency 07/08/2015   Absolute anemia 04/27/2015   Diabetic peripheral neuropathy associated with type 2 diabetes mellitus ([place]) 04/27/2015   Breath shortness 04/27/2015   Accumulation of fluid in tissues 04/27/2015   Cardiac murmur 04/27/2015   HLD (hyperlipidemia) 04/27/2015   Decreased potassium in the blood 04/27/2015   Hypothyroidism 04/27/2015   Obstructive sleep apnea 04/27/2015   Disorder of peripheral nervous system 04/27/2015   Obstructive apnea 04/27/2015   Pain in shoulder 04/27/2015   Diabetes mellitus, type 2 ([place]) 04/27/2015   Fibroid uterus 10/25/2013   Aortic valve stenosis 05/01/2011   Edema 03/27/2011   SOB (shortness of breath) 03/27/2011   Diabetes mellitus ([place]) 03/27/2011   HTN (hypertension) 03/27/2011   Essential (primary) hypertension 07/05/2006   Anxiety, generalized 07/05/2006    Plan  Left simple mastectomy with sentinel lymph node biopsy.     I discussed the available options with the patient, and husband.    I also discussed that given the large size of the cancer/DCIS would recommend mastectomy.  I also discussed that we would need to do a sentinel lymph node biopsy to check the nodes.    We also discussed that  prognostic indicators may warrant pursuing neoadjuvant treatment, I believe they understand we could change gears and place a Port-A-Cath first.  So we discussed the risks inherent within that procedure as well.  I discussed risks of bleeding, infection, damage to surrounding tissues, having positive margins, needing additional resection, trauma to nerves causing arm numbness or difficulty raising arm, causing lymphoedema in the arm; as well as anesthesia risks of MI, stroke, prolonged ventilation, pulmonary embolism, thrombosis and even death.   Patient was given the opportunity to ask questions and have them answered.  They understand that we may first proceed with left mastectomy with sentinel lymph node biopsy.    Face-to-face time spent with the patient and accompanying care providers(if present) was 45 minutes, with more than 50% of the time spent counseling, educating, and coordinating care of the patient.    These notes generated with voice recognition software. I apologize for typographical errors.  Ronny Bacon M.D., FACS 07/25/2021, 2:42 PM

## 2021-07-25 NOTE — Progress Notes (Signed)
Patient ID: Emily Tapia, female   DOB: Nov 22, 1961, 60 y.o.   MRN: [id number]  Chief Complaint: Left breast invasive cancer with DCIS.  History of Present Illness Emily Tapia is a 60 y.o. female with a left breast mass that under imaging identified suspicious microcalcifications covering an area of about 10 cm.  2 stereotactic biopsies confirmed DCIS.  An ultrasound defined a mass and core biopsy of it confirmed an invasive mammary carcinoma.  These were all located in the upper outer quadrant left breast where her presenting mass occurred.  She had no overlying skin changes.  No axillary mass.  No nipple discharge.  She does have a family history of breast cancer in maternal grandmother.  She has a half sister but is unaware of her history.  She underwent menses at the age of 40 and she has been pregnant 3 times with the first child delivered at her age of 49.  Her last mammogram was about 6 years ago.  She did have some similar calcifications in the right breast, but a lumpectomy/excisional biopsy was done years ago, and the pathology from right breast was consistent with fat necrosis without evidence of any atypia or suspicion for cancer.  Past Medical History Past Medical History:  Diagnosis Date   Anxiety    Aortic stenosis    Diabetes mellitus    Edema    Heart murmur    Hypertension    Sleep apnea    Thyroid disease       Past Surgical History:  Procedure Laterality Date   ABDOMINAL HYSTERECTOMY  11/17/2013   BREAST BIOPSY     x2   BREAST BIOPSY Left 07/20/2021   [place] Bx, Ribbon Clip, Path pending   BREAST BIOPSY Left 07/20/2021   Stereo Bx, X-clip, path pending   BREAST BIOPSY Left 07/20/2021   Stereo biopsy, coil clip, path pending   BREAST BIOPSY Right 07/20/2021   Stereo Bx, Ribbon Clip, path pending   CESAREAN SECTION     x 2   CHOLECYSTECTOMY  12/17/2001   DUE TO STONES   DILATION AND CURETTAGE OF UTERUS     ELBOW SURGERY     POLYPECTOMY   12/17/1993    No Known Allergies  Current Outpatient Medications  Medication Sig Dispense Refill   Ascorbic Acid (VITAMIN C PO) Take by mouth daily.     aspirin 81 MG EC tablet Take 81 mg by mouth at bedtime.     Cholecalciferol (VITAMIN D3) 1000 UNITS CAPS Take 1 capsule by mouth every morning.     Dulaglutide (TRULICITY) 3 JG/2.8ZM SOPN Inject 3 mg as directed once a week. 2 mL 3   EUTHYROX 75 MCG tablet TAKE 1 TABLET BY MOUTH ONCE DAILY BEFORE BREAKFAST .OFFICE  VIST  NEEDED  BEFORE  NEXT  REFILL  IS  DUE 90 tablet 3   Ferrous Sulfate (IRON) 325 (65 Fe) MG TABS Take 1 tablet by mouth once daily with breakfast 90 tablet 0   furosemide (LASIX) 40 MG tablet TAKE 1 TABLET BY MOUTH ONCE DAILY AS NEEDED FOR FLUID OR EDEMA 90 tablet 1   glucose blood (CONTOUR NEXT TEST) test strip Test fasting sugar each morning. Recheck if having hypoglycemic symptoms. 100 each 4   glyBURIDE (DIABETA) 5 MG tablet TAKE 1 TABLET BY MOUTH TWICE DAILY WITH MEALS. 90 tablet 3   hydrochlorothiazide (HYDRODIURIL) 25 MG tablet Take 1 tablet (25 mg total) by mouth daily. 90 tablet 1   lisinopril (ZESTRIL)  40 MG tablet Take 1 tablet (40 mg total) by mouth daily. 90 tablet 1   meclizine (ANTIVERT) 25 MG tablet Take 1 tablet (25 mg total) by mouth 3 (three) times daily as needed for dizziness. 30 tablet 0   metFORMIN (GLUCOPHAGE) 1000 MG tablet Take 1 tablet (1,000 mg total) by mouth 2 (two) times daily with a meal. 180 tablet 3   Multiple Vitamin (MULTIVITAMIN) tablet Take 1 tablet by mouth every morning.     Multiple Vitamins-Minerals (ZINC PO) Take by mouth daily.     PARoxetine (PAXIL) 20 MG tablet Take 1 tablet (20 mg total) by mouth daily. 30 tablet 2   carvedilol (COREG) 25 MG tablet Take 1 tablet (25 mg total) by mouth 2 (two) times daily. 180 tablet 3   No current facility-administered medications for this visit.    Family History Family History  Problem Relation Age of Onset   Hyperlipidemia Mother     Thyroid disease Mother    Hashimoto's thyroiditis Mother    Heart disease Father        open heart surgery   Heart failure Father    Diabetes Father    Dementia Father    Thyroid disease Brother    Breast cancer Maternal Grandmother    Diabetes Paternal Grandmother    Prostate cancer Paternal Grandfather       Social History Social History   Tobacco Use   Smoking status: Former    Packs/day: 1.50    Years: 25.00    Pack years: 37.50    Types: Cigarettes    Quit date: 11/24/2001    Years since quitting: 19.6   Smokeless tobacco: Never  Vaping Use   Vaping Use: Never used  Substance Use Topics   Alcohol use: Yes    Alcohol/week: 0.0 standard drinks    Comment: OCCASIONALLY DRINKS WINE, ONCE A WEEK   Drug use: No        Review of Systems  Constitutional: Negative.   HENT: Negative.    Eyes: Negative.   Respiratory: Negative.    Cardiovascular:  Positive for palpitations and leg swelling.  Gastrointestinal: Negative.   Genitourinary: Negative.   Skin: Negative.   Neurological:  Positive for dizziness (Vertigo).  Psychiatric/Behavioral: Negative.       Physical Exam Blood pressure 137/82, pulse 71, temperature 98.6 F (37 C), temperature source Oral, height $RemoveBefo'5\' 4"'bjNRhdDPhHk$  (1.626 m), weight 189 lb (85.7 kg), SpO2 94 %. Last Weight  Most recent update: 07/25/2021  2:05 PM    Weight  85.7 kg (189 lb)             CONSTITUTIONAL: Well developed, and nourished, appropriately responsive and aware without distress.  EYES: Sclera non-icteric.   EARS, NOSE, MOUTH AND THROAT: Mask worn.   The oropharynx is clear. Oral mucosa is pink and moist.     Hearing is intact to voice.  NECK: Trachea is midline, and there is no jugular venous distension.  LYMPH NODES:  Lymph nodes in the neck are not enlarged. RESPIRATORY:  Lungs are clear, and breath sounds are equal bilaterally. Normal respiratory effort without pathologic use of accessory muscles. CARDIOVASCULAR: Heart is regular in  rate and rhythm. GI: The abdomen is soft, nontender, and nondistended. There were no palpable masses. I did not appreciate hepatosplenomegaly. There were normal bowel sounds. GU: Mild asymmetry with left breast slightly larger than right.  Skin changes consistent with ecchymosis following biopsies of both breasts.  Multiple areas on the left,  1 on the right.  No suspicious dermal changes, peau d'orange, dimpling or tethering on exam. MUSCULOSKELETAL:  Symmetrical muscle tone appreciated in all four extremities.    SKIN: Skin turgor is normal. No pathologic skin lesions appreciated.  NEUROLOGIC:  Motor and sensation appear grossly normal.  Cranial nerves are grossly without defect. PSYCH:  Alert and oriented to person, place and time. Affect is appropriate for situation.  Data Reviewed I have personally reviewed what is currently available of the patient's imaging, recent labs and medical records.   Labs:  CBC Latest Ref Rng & Units 07/14/2021 10/14/2020 01/02/2020  WBC 3.4 - 10.8 x10E3/uL 6.5 5.1 7.5  Hemoglobin 11.1 - 15.9 g/dL 12.6 13.1 13.1  Hematocrit 34.0 - 46.6 % 39.7 42.2 42.1  Platelets 150 - 450 x10E3/uL 130(L) 137(L) 137(L)   CMP Latest Ref Rng & Units 07/14/2021 10/14/2020 01/02/2020  Glucose 65 - 99 mg/dL 119(H) 391(H) 192(H)  BUN 8 - 27 mg/dL $Remove'13 16 15  'VSSKSNO$ Creatinine 0.57 - 1.00 mg/dL 0.59 0.66 0.72  Sodium 134 - 144 mmol/L 142 137 138  Potassium 3.5 - 5.2 mmol/L 3.5 3.8 3.4(L)  Chloride 96 - 106 mmol/L 99 93(L) 99  CO2 20 - 29 mmol/L $RemoveB'26 25 30  'juSNaSkC$ Calcium 8.7 - 10.3 mg/dL 9.4 9.8 9.2  Total Protein 6.0 - 8.5 g/dL 6.8 7.0 -  Total Bilirubin 0.0 - 1.2 mg/dL 0.4 0.6 -  Alkaline Phos 44 - 121 IU/L 63 85 -  AST 0 - 40 IU/L 24 39 -  ALT 0 - 32 IU/L 32 44(H) -   SURGICAL PATHOLOGY  CASE: [id number]  PATIENT: Enzo Montgomery  Surgical Pathology Report   Specimen Submitted:  A. Breast mass, left 1:00 6 CMFN   Clinical History: Highly suspicious 5.1 cm mass in left breast at the 1   o'clock axis; ribbon clip deployed.  Patient also has highly suspicious  calcifications in the UOQ of left breast for which stereo BX's will be  performed later today (with separate path orders).   DIAGNOSIS:  A. BREAST MASS, LEFT 1:00 6 CM FN; ULTRASOUND-GUIDED BIOPSY (RIBBON  CLIP):  - INVASIVE MAMMARY CARCINOMA, NO SPECIAL TYPE.   Size of invasive carcinoma: 3 mm in this sample  Histologic grade of invasive carcinoma: Grade 2                       Glandular/tubular differentiation score: 3                       Nuclear pleomorphism score: 2                       Mitotic rate score: 1                       Total score: 6  Ductal carcinoma in situ: Present, high-grade comedo type  Lymphovascular invasion: Not identified   ER/PR/HER2: Immunohistochemistry will be performed on block A1, with  reflex to [place] for HER2 2+. The results will be reported in an addendum.   SURGICAL PATHOLOGY  CASE: [id number]  PATIENT: Enzo Montgomery  Surgical Pathology Report   Specimen Submitted:  A. Breast, left upper outer posterior  B. Breast, left upper outer anterior  C. Breast, right upper outer quadrant   Clinical History: Highly suspicious calcifications spanning 10 cm in the  upper outer left breast. A: sampling of posterior aspect of  calcifications in  left UOQ with X clip deployed. B. sampling of anterior  aspect of calcifications in left UOQ with coil clip deployed. C.  indeterminate calcifications in upper outer right breast at site of  previous surgical excision, ribbon clip deployed.   DIAGNOSIS:  A. BREAST CALCIFICATIONS, LEFT UPPER OUTER POSTERIOR; STEREOTACTIC  BIOPSY (X CLIP):  - HIGH-GRADE DUCTAL CARCINOMA IN SITU (DCIS), COMEDO TYPE WITH  ASSOCIATED CALCIFICATIONS.  - FOCUS SUSPICIOUS FOR MICRO INVASIVE CARCINOMA, SEE COMMENT.   B.  BREAST CALCIFICATIONS, LEFT UPPER OUTER ANTERIOR; STEREOTACTIC  BIOPSY (COIL CLIP):  - HIGH-GRADE DUCTAL CARCINOMA IN SITU (DCIS), COMEDO  TYPE WITH  ASSOCIATED CALCIFICATIONS.   C.  BREAST CALCIFICATIONS, RIGHT UPPER OUTER; STEREOTACTIC BIOPSY  (RIBBON CLIP):  - PREVIOUS SURGICAL SITE CHANGE WITH ORGANIZING FAT NECROSIS WITH  ASSOCIATED CALCIFICATIONS.  - NEGATIVE FOR ATYPIA AND MALIGNANCY.   Comment:  In specimen A there is a single 0.07 mm focus that is suspicious for  micro invasive carcinoma.  Breast biomarkers will not be performed on the above specimens and have  been ordered on the concurrent left breast biopsy ([id number]) with  invasive mammary carcinoma.    Imaging: Radiology review:   CLINICAL DATA:  60 year old presenting with a palpable lump in the upper LEFT breast over the past 3 weeks associated with LEFT nipple pain and tenderness. Annual evaluation, RIGHT breast. Personal history of remote benign excisional biopsies of the RIGHT breast.   EXAM: DIGITAL DIAGNOSTIC BILATERAL MAMMOGRAM WITH TOMOSYNTHESIS AND CAD; ULTRASOUND LEFT BREAST LIMITED   TECHNIQUE: Bilateral digital diagnostic mammography and breast tomosynthesis was performed. The images were evaluated with computer-aided detection.; Targeted ultrasound examination of the left breast was performed.   COMPARISON:  Previous exams, most recently 11/09/2015.   ACR Breast Density Category b: There are scattered areas of fibroglandular density.   FINDINGS: Full field CC and MLO views of both breasts, a spot compression tangential view of the palpable concern in the LEFT breast, spot magnification CC and mediolateral views of calcifications in both breasts, and a spot tangential view of a mammographic finding in the RIGHT breast were obtained.   RIGHT: Scar/architectural distortion associated with calcifications in the outer breast at middle depth, shown on the spot tangential view to be related to a surgical scar from a prior excisional biopsy. The spot magnification views of the calcifications at the surgical site demonstrate an  approximate 2.1 cm group of fine heterogeneous calcifications. There is an adjacent superficial benign calcified oil cyst underlying the scar, separate from the group.   No suspicious findings elsewhere.   LEFT: Developing asymmetry associated with architectural distortion and suspicious calcifications in the upper breast at anterior to middle depth corresponding to the palpable concern. The calcifications are in a segmental distribution and span approximately 10.0 x 4.2 x 4.7 cm.   Targeted ultrasound is performed, demonstrating an irregular infiltrative hypoechoic mass at the 1 o'clock position approximately 6 cm from nipple, measuring in total approximately 5.1 x 2 1.7 x 2.7 cm, demonstrating posterior acoustic shadowing and demonstrating internal power Doppler flow.   Sonographic evaluation of the LEFT axilla demonstrates no pathologic lymphadenopathy.   On correlative physical examination, there is palpable thickening involving the UPPER OUTER QUADRANT of the LEFT breast.   IMPRESSION: 1. Highly suspicious infiltrative mass measuring approximately 5.1 cm in the UPPER OUTER QUADRANT of the LEFT breast at 1 o'clock 6 cm from the nipple. 2. Associated highly suspicious segmental calcifications spanning approximately 10.0 cm in the upper LEFT breast. 3. No  pathologic LEFT axillary lymphadenopathy. 4. Indeterminate 2.1 cm group of calcifications associated with a surgical scar in the UPPER OUTER QUADRANT of the RIGHT breast. While these may be benign and related to fat necrosis, they are fine heterogeneous in morphology, and therefore indeterminate.   RECOMMENDATION: 1. Ultrasound-guided core needle biopsy of the LEFT breast mass. 2. Stereotactic tomosynthesis core needle biopsy of the anterior and posterior extent of the segmental calcifications in the upper LEFT breast. 3. Stereotactic tomosynthesis core needle biopsy of the RIGHT breast calcifications.   The ultrasound  core needle biopsy and the stereotactic tomosynthesis core needle biopsy procedures were discussed with the patient and her questions were answered. She wishes to proceed with the biopsy. She will be contacted by the Breast Navigator at [facility] in order to schedule the biopsies.   I have discussed the findings and recommendations with the patient.   BI-RADS CATEGORY  5: Highly suggestive of malignancy.     Electronically Signed   By: Evangeline Dakin M.D.   On: 07/14/2021 11:38 Within last 24 hrs: No results found.  Assessment    Left breast cancer, prognostic indicators pending. Patient Active Problem List   Diagnosis Date Noted   Pelvic mass 07/08/2015   Hyperlipidemia, mixed 07/08/2015   Hx of iron deficiency 07/08/2015   Absolute anemia 04/27/2015   Diabetic peripheral neuropathy associated with type 2 diabetes mellitus ([place]) 04/27/2015   Breath shortness 04/27/2015   Accumulation of fluid in tissues 04/27/2015   Cardiac murmur 04/27/2015   HLD (hyperlipidemia) 04/27/2015   Decreased potassium in the blood 04/27/2015   Hypothyroidism 04/27/2015   Obstructive sleep apnea 04/27/2015   Disorder of peripheral nervous system 04/27/2015   Obstructive apnea 04/27/2015   Pain in shoulder 04/27/2015   Diabetes mellitus, type 2 ([place]) 04/27/2015   Fibroid uterus 10/25/2013   Aortic valve stenosis 05/01/2011   Edema 03/27/2011   SOB (shortness of breath) 03/27/2011   Diabetes mellitus ([place]) 03/27/2011   HTN (hypertension) 03/27/2011   Essential (primary) hypertension 07/05/2006   Anxiety, generalized 07/05/2006    Plan  Left simple mastectomy with sentinel lymph node biopsy.     I discussed the available options with the patient, and husband.    I also discussed that given the large size of the cancer/DCIS would recommend mastectomy.  I also discussed that we would need to do a sentinel lymph node biopsy to check the nodes.    We also discussed that  prognostic indicators may warrant pursuing neoadjuvant treatment, I believe they understand we could change gears and place a Port-A-Cath first.  So we discussed the risks inherent within that procedure as well.  I discussed risks of bleeding, infection, damage to surrounding tissues, having positive margins, needing additional resection, trauma to nerves causing arm numbness or difficulty raising arm, causing lymphoedema in the arm; as well as anesthesia risks of MI, stroke, prolonged ventilation, pulmonary embolism, thrombosis and even death.   Patient was given the opportunity to ask questions and have them answered.  They understand that we may first proceed with left mastectomy with sentinel lymph node biopsy.    Face-to-face time spent with the patient and accompanying care providers(if present) was 45 minutes, with more than 50% of the time spent counseling, educating, and coordinating care of the patient.    These notes generated with voice recognition software. I apologize for typographical errors.  Ronny Bacon M.D., FACS 07/25/2021, 2:42 PM

## 2021-07-26 ENCOUNTER — Other Ambulatory Visit: Payer: Self-pay | Admitting: Surgery

## 2021-07-26 ENCOUNTER — Telehealth: Payer: Self-pay | Admitting: Surgery

## 2021-07-26 DIAGNOSIS — C50412 Malignant neoplasm of upper-outer quadrant of left female breast: Secondary | ICD-10-CM

## 2021-07-26 NOTE — Telephone Encounter (Signed)
Patient has been advised of Pre-Admission date/time, COVID Testing date and Surgery date.  Surgery Date: 08/16/21 arrive at 1:00 for SLN first prior to surgery same day.  Preadmission Testing Date: 08/07/21 (phone 8a-1p) Covid Testing Date: Not needed.     Patient has been made aware to arrive @ 1:00 on 08/16/21 for SLN to be done first.

## 2021-07-27 ENCOUNTER — Encounter: Payer: Self-pay | Admitting: Oncology

## 2021-07-27 ENCOUNTER — Inpatient Hospital Stay: Payer: 59

## 2021-07-27 ENCOUNTER — Inpatient Hospital Stay: Payer: 59 | Attending: Oncology | Admitting: Oncology

## 2021-07-27 DIAGNOSIS — Z8042 Family history of malignant neoplasm of prostate: Secondary | ICD-10-CM | POA: Insufficient documentation

## 2021-07-27 DIAGNOSIS — Z87891 Personal history of nicotine dependence: Secondary | ICD-10-CM | POA: Insufficient documentation

## 2021-07-27 DIAGNOSIS — F419 Anxiety disorder, unspecified: Secondary | ICD-10-CM | POA: Diagnosis not present

## 2021-07-27 DIAGNOSIS — Z79899 Other long term (current) drug therapy: Secondary | ICD-10-CM | POA: Diagnosis not present

## 2021-07-27 DIAGNOSIS — I10 Essential (primary) hypertension: Secondary | ICD-10-CM | POA: Diagnosis not present

## 2021-07-27 DIAGNOSIS — Z803 Family history of malignant neoplasm of breast: Secondary | ICD-10-CM | POA: Diagnosis not present

## 2021-07-27 DIAGNOSIS — Z833 Family history of diabetes mellitus: Secondary | ICD-10-CM | POA: Insufficient documentation

## 2021-07-27 DIAGNOSIS — E119 Type 2 diabetes mellitus without complications: Secondary | ICD-10-CM | POA: Diagnosis not present

## 2021-07-27 DIAGNOSIS — N6489 Other specified disorders of breast: Secondary | ICD-10-CM | POA: Diagnosis not present

## 2021-07-27 DIAGNOSIS — D0512 Intraductal carcinoma in situ of left breast: Secondary | ICD-10-CM | POA: Insufficient documentation

## 2021-07-27 DIAGNOSIS — I350 Nonrheumatic aortic (valve) stenosis: Secondary | ICD-10-CM | POA: Diagnosis not present

## 2021-07-27 DIAGNOSIS — Z9049 Acquired absence of other specified parts of digestive tract: Secondary | ICD-10-CM | POA: Insufficient documentation

## 2021-07-27 DIAGNOSIS — Z8349 Family history of other endocrine, nutritional and metabolic diseases: Secondary | ICD-10-CM | POA: Insufficient documentation

## 2021-07-27 DIAGNOSIS — Z8249 Family history of ischemic heart disease and other diseases of the circulatory system: Secondary | ICD-10-CM | POA: Insufficient documentation

## 2021-07-27 DIAGNOSIS — Z818 Family history of other mental and behavioral disorders: Secondary | ICD-10-CM | POA: Insufficient documentation

## 2021-07-27 LAB — SURGICAL PATHOLOGY

## 2021-07-27 NOTE — Progress Notes (Signed)
Emily Tapia  [phone] [phone]  ID: Emily Tapia OB: 1961/08/25  MR#: [id number]  KZL#:[id number]  Patient Care Team: Tania Ade as PCP - General (Physician Assistant) [name], MD as PCP - Cardiology (Cardiology)  CHIEF COMPLAINT: Left breast DCIS  INTERVAL HISTORY: Patient is a 60 year old female who had not had a screening mammogram in 5 to 6 years who palpated an abnormal lump in her left breast.  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  She currently feels well and is asymptomatic.  She has no neurologic complaints.  She denies any recent fevers or illnesses.  She has a good appetite and denies weight loss.  She has no chest pain, shortness of breath, cough, or hemoptysis.  She denies any nausea, vomiting, constipation, or diarrhea.  She has no urinary complaints.  Patient feels at her baseline and offers no specific complaints today.  REVIEW OF SYSTEMS:   Review of Systems  Constitutional: Negative.  Negative for fever, malaise/fatigue and weight loss.  Respiratory: Negative.  Negative for cough and shortness of breath.   Cardiovascular: Negative.  Negative for chest pain and leg swelling.  Gastrointestinal: Negative.  Negative for abdominal pain.  Genitourinary: Negative.  Negative for dysuria.  Musculoskeletal: Negative.  Negative for back pain.  Skin: Negative.  Negative for rash.  Neurological: Negative.  Negative for dizziness, focal weakness, weakness and headaches.  Psychiatric/Behavioral: Negative.  The patient is not nervous/anxious.    As per HPI. Otherwise, a complete review of systems is negative.  PAST MEDICAL HISTORY: Past Medical History:  Diagnosis Date   Anxiety    Aortic stenosis    Diabetes mellitus    Edema    Heart murmur    Hypertension    Sleep apnea    Thyroid disease     PAST SURGICAL HISTORY: Past Surgical History:  Procedure  Laterality Date   ABDOMINAL HYSTERECTOMY  11/17/2013   BREAST BIOPSY     x2   BREAST BIOPSY Left 07/20/2021   [place] Bx, Ribbon Clip, Path pending   BREAST BIOPSY Left 07/20/2021   Stereo Bx, X-clip, path pending   BREAST BIOPSY Left 07/20/2021   Stereo biopsy, coil clip, path pending   BREAST BIOPSY Right 07/20/2021   Stereo Bx, Ribbon Clip, path pending   CESAREAN SECTION     x 2   CHOLECYSTECTOMY  12/17/2001   DUE TO STONES   DILATION AND CURETTAGE OF UTERUS     ELBOW SURGERY     POLYPECTOMY  12/17/1993    FAMILY HISTORY: Family History  Problem Relation Age of Onset   Hyperlipidemia Mother    Thyroid disease Mother    Hashimoto's thyroiditis Mother    Heart disease Father        open heart surgery   Heart failure Father    Diabetes Father    Dementia Father    Thyroid disease Brother    Breast cancer Maternal Grandmother    Diabetes Paternal Grandmother    Prostate cancer Paternal Grandfather     ADVANCED DIRECTIVES (Y/N):  N  HEALTH MAINTENANCE: Social History   Tobacco Use   Smoking status: Former    Packs/day: 1.50    Years: 25.00    Pack years: 37.50    Types: Cigarettes    Quit date: 11/24/2001    Years since quitting: 19.6   Smokeless tobacco: Never  Vaping Use   Vaping Use: Never used  Substance Use Topics  Alcohol use: Yes    Alcohol/week: 0.0 standard drinks    Comment: OCCASIONALLY DRINKS WINE, ONCE A WEEK   Drug use: No     Colonoscopy:  PAP:  Bone density:  Lipid panel:  No Known Allergies  Current Outpatient Medications  Medication Sig Dispense Refill   Ascorbic Acid (VITAMIN C PO) Take by mouth daily.     aspirin 81 MG EC tablet Take 81 mg by mouth at bedtime.     Cholecalciferol (VITAMIN D3) 1000 UNITS CAPS Take 1 capsule by mouth every morning.     Dulaglutide (TRULICITY) 3 CH/8.5ID SOPN Inject 3 mg as directed once a week. 2 mL 3   EUTHYROX 75 MCG tablet TAKE 1 TABLET BY MOUTH ONCE DAILY BEFORE BREAKFAST .OFFICE  VIST  NEEDED   BEFORE  NEXT  REFILL  IS  DUE 90 tablet 3   Ferrous Sulfate (IRON) 325 (65 Fe) MG TABS Take 1 tablet by mouth once daily with breakfast 90 tablet 0   furosemide (LASIX) 40 MG tablet TAKE 1 TABLET BY MOUTH ONCE DAILY AS NEEDED FOR FLUID OR EDEMA 90 tablet 1   glucose blood (CONTOUR NEXT TEST) test strip Test fasting sugar each morning. Recheck if having hypoglycemic symptoms. 100 each 4   glyBURIDE (DIABETA) 5 MG tablet TAKE 1 TABLET BY MOUTH TWICE DAILY WITH MEALS. 90 tablet 3   hydrochlorothiazide (HYDRODIURIL) 25 MG tablet Take 1 tablet (25 mg total) by mouth daily. 90 tablet 1   lisinopril (ZESTRIL) 40 MG tablet Take 1 tablet (40 mg total) by mouth daily. 90 tablet 1   meclizine (ANTIVERT) 25 MG tablet Take 1 tablet (25 mg total) by mouth 3 (three) times daily as needed for dizziness. 30 tablet 0   metFORMIN (GLUCOPHAGE) 1000 MG tablet Take 1 tablet (1,000 mg total) by mouth 2 (two) times daily with a meal. 180 tablet 3   Multiple Vitamin (MULTIVITAMIN) tablet Take 1 tablet by mouth every morning.     Multiple Vitamins-Minerals (ZINC PO) Take by mouth daily.     PARoxetine (PAXIL) 20 MG tablet Take 1 tablet (20 mg total) by mouth daily. 30 tablet 2   carvedilol (COREG) 25 MG tablet Take 1 tablet (25 mg total) by mouth 2 (two) times daily. 180 tablet 3   No current facility-administered medications for this visit.    OBJECTIVE: Vitals:   07/27/21 1342  BP: (!) 141/91  Pulse: 71  Temp: 98.2 F (36.8 C)  SpO2: 97%     Body mass index is 32.79 kg/m.    ECOG FS:0 - Asymptomatic  General: Well-developed, well-nourished, no acute distress. Eyes: Pink conjunctiva, anicteric sclera. HEENT: Normocephalic, moist mucous membranes. Breast: Exam performed by another provider earlier this week. Lungs: No audible wheezing or coughing. Heart: Regular rate and rhythm. Abdomen: Soft, nontender, no obvious distention. Musculoskeletal: No edema, cyanosis, or clubbing. Neuro: Alert, answering all  questions appropriately. Cranial nerves grossly intact. Skin: No rashes or petechiae noted. Psych: Normal affect. Lymphatics: No cervical, calvicular, axillary or inguinal LAD.   LAB RESULTS:  Lab Results  Component Value Date   NA 142 07/14/2021   K 3.5 07/14/2021   CL 99 07/14/2021   CO2 26 07/14/2021   GLUCOSE 119 (H) 07/14/2021   BUN 13 07/14/2021   CREATININE 0.59 07/14/2021   CALCIUM 9.4 07/14/2021   PROT 6.8 07/14/2021   ALBUMIN 4.6 07/14/2021   AST 24 07/14/2021   ALT 32 07/14/2021   ALKPHOS 63 07/14/2021   BILITOT  0.4 07/14/2021   GFRNONAA 96 10/14/2020   GFRAA 111 10/14/2020    Lab Results  Component Value Date   WBC 6.5 07/14/2021   NEUTROABS 3.7 07/14/2021   HGB 12.6 07/14/2021   HCT 39.7 07/14/2021   MCV 80 07/14/2021   PLT 130 (L) 07/14/2021     STUDIES: US BREAST LTD UNI LEFT INC AXILLA  Result Date: 07/14/2021 CLINICAL DATA:  60 year old presenting with a palpable lump in the upper LEFT breast over the past 3 weeks associated with LEFT nipple pain and tenderness. Annual evaluation, RIGHT breast. Personal history of remote benign excisional biopsies of the RIGHT breast. EXAM: DIGITAL DIAGNOSTIC BILATERAL MAMMOGRAM WITH TOMOSYNTHESIS AND CAD; ULTRASOUND LEFT BREAST LIMITED TECHNIQUE: Bilateral digital diagnostic mammography and breast tomosynthesis was performed. The images were evaluated with computer-aided detection.; Targeted ultrasound examination of the left breast was performed. COMPARISON:  Previous exams, most recently 11/09/2015. ACR Breast Density Category b: There are scattered areas of fibroglandular density. FINDINGS: Full field CC and MLO views of both breasts, a spot compression tangential view of the palpable concern in the LEFT breast, spot magnification CC and mediolateral views of calcifications in both breasts, and a spot tangential view of a mammographic finding in the RIGHT breast were obtained. RIGHT: Scar/architectural distortion  associated with calcifications in the outer breast at middle depth, shown on the spot tangential view to be related to a surgical scar from a prior excisional biopsy. The spot magnification views of the calcifications at the surgical site demonstrate an approximate 2.1 cm group of fine heterogeneous calcifications. There is an adjacent superficial benign calcified oil cyst underlying the scar, separate from the group. No suspicious findings elsewhere. LEFT: Developing asymmetry associated with architectural distortion and suspicious calcifications in the upper breast at anterior to middle depth corresponding to the palpable concern. The calcifications are in a segmental distribution and span approximately 10.0 x 4.2 x 4.7 cm. Targeted ultrasound is performed, demonstrating an irregular infiltrative hypoechoic mass at the 1 o'clock position approximately 6 cm from nipple, measuring in total approximately 5.1 x 2 1.7 x 2.7 cm, demonstrating posterior acoustic shadowing and demonstrating internal power Doppler flow. Sonographic evaluation of the LEFT axilla demonstrates no pathologic lymphadenopathy. On correlative physical examination, there is palpable thickening involving the UPPER OUTER QUADRANT of the LEFT breast. IMPRESSION: 1. Highly suspicious infiltrative mass measuring approximately 5.1 cm in the UPPER OUTER QUADRANT of the LEFT breast at 1 o'clock 6 cm from the nipple. 2. Associated highly suspicious segmental calcifications spanning approximately 10.0 cm in the upper LEFT breast. 3. No pathologic LEFT axillary lymphadenopathy. 4. Indeterminate 2.1 cm group of calcifications associated with a surgical scar in the UPPER OUTER QUADRANT of the RIGHT breast. While these may be benign and related to fat necrosis, they are fine heterogeneous in morphology, and therefore indeterminate. RECOMMENDATION: 1. Ultrasound-guided core needle biopsy of the LEFT breast mass. 2. Stereotactic tomosynthesis core needle biopsy  of the anterior and posterior extent of the segmental calcifications in the upper LEFT breast. 3. Stereotactic tomosynthesis core needle biopsy of the RIGHT breast calcifications. The ultrasound core needle biopsy and the stereotactic tomosynthesis core needle biopsy procedures were discussed with the patient and her questions were answered. She wishes to proceed with the biopsy. She will be contacted by the Breast Navigator at [facility] in order to schedule the biopsies. I have discussed the findings and recommendations with the patient. BI-RADS CATEGORY  5: Highly suggestive of malignancy. Electronically Signed   By:  Evangeline Dakin M.D.   On: 07/14/2021 11:38  MM DIAG BREAST TOMO BILATERAL  Result Date: 07/14/2021 CLINICAL DATA:  60 year old presenting with a palpable lump in the upper LEFT breast over the past 3 weeks associated with LEFT nipple pain and tenderness. Annual evaluation, RIGHT breast. Personal history of remote benign excisional biopsies of the RIGHT breast. EXAM: DIGITAL DIAGNOSTIC BILATERAL MAMMOGRAM WITH TOMOSYNTHESIS AND CAD; ULTRASOUND LEFT BREAST LIMITED TECHNIQUE: Bilateral digital diagnostic mammography and breast tomosynthesis was performed. The images were evaluated with computer-aided detection.; Targeted ultrasound examination of the left breast was performed. COMPARISON:  Previous exams, most recently 11/09/2015. ACR Breast Density Category b: There are scattered areas of fibroglandular density. FINDINGS: Full field CC and MLO views of both breasts, a spot compression tangential view of the palpable concern in the LEFT breast, spot magnification CC and mediolateral views of calcifications in both breasts, and a spot tangential view of a mammographic finding in the RIGHT breast were obtained. RIGHT: Scar/architectural distortion associated with calcifications in the outer breast at middle depth, shown on the spot tangential view to be related to a surgical scar from a  prior excisional biopsy. The spot magnification views of the calcifications at the surgical site demonstrate an approximate 2.1 cm group of fine heterogeneous calcifications. There is an adjacent superficial benign calcified oil cyst underlying the scar, separate from the group. No suspicious findings elsewhere. LEFT: Developing asymmetry associated with architectural distortion and suspicious calcifications in the upper breast at anterior to middle depth corresponding to the palpable concern. The calcifications are in a segmental distribution and span approximately 10.0 x 4.2 x 4.7 cm. Targeted ultrasound is performed, demonstrating an irregular infiltrative hypoechoic mass at the 1 o'clock position approximately 6 cm from nipple, measuring in total approximately 5.1 x 2 1.7 x 2.7 cm, demonstrating posterior acoustic shadowing and demonstrating internal power Doppler flow. Sonographic evaluation of the LEFT axilla demonstrates no pathologic lymphadenopathy. On correlative physical examination, there is palpable thickening involving the UPPER OUTER QUADRANT of the LEFT breast. IMPRESSION: 1. Highly suspicious infiltrative mass measuring approximately 5.1 cm in the UPPER OUTER QUADRANT of the LEFT breast at 1 o'clock 6 cm from the nipple. 2. Associated highly suspicious segmental calcifications spanning approximately 10.0 cm in the upper LEFT breast. 3. No pathologic LEFT axillary lymphadenopathy. 4. Indeterminate 2.1 cm group of calcifications associated with a surgical scar in the UPPER OUTER QUADRANT of the RIGHT breast. While these may be benign and related to fat necrosis, they are fine heterogeneous in morphology, and therefore indeterminate. RECOMMENDATION: 1. Ultrasound-guided core needle biopsy of the LEFT breast mass. 2. Stereotactic tomosynthesis core needle biopsy of the anterior and posterior extent of the segmental calcifications in the upper LEFT breast. 3. Stereotactic tomosynthesis core needle biopsy  of the RIGHT breast calcifications. The ultrasound core needle biopsy and the stereotactic tomosynthesis core needle biopsy procedures were discussed with the patient and her questions were answered. She wishes to proceed with the biopsy. She will be contacted by the Breast Navigator at [facility] in order to schedule the biopsies. I have discussed the findings and recommendations with the patient. BI-RADS CATEGORY  5: Highly suggestive of malignancy. Electronically Signed   By: Evangeline Dakin M.D.   On: 07/14/2021 11:38  MM CLIP PLACEMENT LEFT  Result Date: 07/20/2021 CLINICAL DATA:  Status post bilateral breast biopsies today. EXAM: 3D DIAGNOSTIC BILATERAL MAMMOGRAM POST ULTRASOUND AND STEREOTACTIC BIOPSIES COMPARISON:  Previous exam(s). FINDINGS: 3D Mammographic images were obtained following ultrasound  and stereotactic guided biopsy of the bilateral breast. The biopsy marking clips are in expected position at the sites of biopsy. IMPRESSION: 1. Appropriate positioning of the ribbon shaped biopsy marking clip at the site of biopsy in the upper-outer quadrant of the LEFT breast corresponding to ULTRASOUND-guided biopsy of a LEFT breast mass at the 1 o'clock axis. 2. Appropriate positioning of the X shaped biopsy marking clip at the site of biopsy in the upper outer quadrant of the LEFT breast corresponding to the targeted calcifications at POSTERIOR aspect. 3. Appropriate positioning of the coil shaped biopsy marking clip at the site of biopsy in the upper-outer quadrant of the LEFT breast corresponding to the targeted calcifications at ANTERIOR aspect. 4. Appropriate positioning of the ribbon shaped biopsy marking clip at the site of biopsy in the upper-outer quadrant of the RIGHT breast. Final Assessment: Post Procedure Mammograms for Marker Placement Electronically Signed   By: Franki Cabot M.D.   On: 07/20/2021 09:46  MM CLIP PLACEMENT RIGHT  Result Date: 07/20/2021 CLINICAL DATA:  Status  post bilateral breast biopsies today. EXAM: 3D DIAGNOSTIC BILATERAL MAMMOGRAM POST ULTRASOUND AND STEREOTACTIC BIOPSIES COMPARISON:  Previous exam(s). FINDINGS: 3D Mammographic images were obtained following ultrasound and stereotactic guided biopsy of the bilateral breast. The biopsy marking clips are in expected position at the sites of biopsy. IMPRESSION: 1. Appropriate positioning of the ribbon shaped biopsy marking clip at the site of biopsy in the upper-outer quadrant of the LEFT breast corresponding to ULTRASOUND-guided biopsy of a LEFT breast mass at the 1 o'clock axis. 2. Appropriate positioning of the X shaped biopsy marking clip at the site of biopsy in the upper outer quadrant of the LEFT breast corresponding to the targeted calcifications at POSTERIOR aspect. 3. Appropriate positioning of the coil shaped biopsy marking clip at the site of biopsy in the upper-outer quadrant of the LEFT breast corresponding to the targeted calcifications at ANTERIOR aspect. 4. Appropriate positioning of the ribbon shaped biopsy marking clip at the site of biopsy in the upper-outer quadrant of the RIGHT breast. Final Assessment: Post Procedure Mammograms for Marker Placement Electronically Signed   By: Franki Cabot M.D.   On: 07/20/2021 09:46  MM LT BREAST BX W LOC DEV 1ST LESION IMAGE BX SPEC STEREO GUIDE  Addendum Date: 07/24/2021   ADDENDUM REPORT: 07/24/2021 11:57 ADDENDUM: PATHOLOGY revealed: Site 1. BREAST CALCIFICATIONS, LEFT UPPER OUTER POSTERIOR; STEREOTACTIC BIOPSY (X CLIP): - HIGH-GRADE DUCTAL CARCINOMA IN SITU (DCIS), COMEDO TYPE WITH ASSOCIATED CALCIFICATIONS. - FOCUS SUSPICIOUS FOR MICRO INVASIVE CARCINOMA, SEE COMMENT. Pathology results are CONCORDANT with imaging findings, per Dr. Franki Cabot. PATHOLOGY revealed: Site 2. BREAST CALCIFICATIONS, LEFT UPPER OUTER ANTERIOR; STEREOTACTIC BIOPSY (COIL CLIP): - HIGH-GRADE DUCTAL CARCINOMA IN SITU (DCIS), COMEDO TYPE WITH ASSOCIATED CALCIFICATIONS. Pathology  results are CONCORDANT with imaging findings, per Dr. Franki Cabot. PATHOLOGY revealed: Site 3. BREAST CALCIFICATIONS, RIGHT UPPER OUTER; STEREOTACTIC BIOPSY (RIBBON CLIP): - PREVIOUS SURGICAL SITE CHANGE WITH ORGANIZING FAT NECROSIS WITH ASSOCIATED CALCIFICATIONS. - NEGATIVE FOR ATYPIA AND MALIGNANCY. Pathology results are CONCORDANT with imaging findings, per Dr. Franki Cabot. PATHOLOGY revealed: Site 4. BREAST MASS, LEFT 1:00 6 CM FN; ULTRASOUND-GUIDED BIOPSY (RIBBON CLIP): - INVASIVE MAMMARY CARCINOMA, NO SPECIAL TYPE. Size of invasive carcinoma: 3 mm in this sample Grade 2. Ductal carcinoma in situ: Present, high-grade comedo type. Lymphovascular invasion: Not identified. Pathology results are CONCORDANT with imaging findings, per Dr. Franki Cabot. Comment: In specimen #1 there is a single 0.07 mm focus that is suspicious for micro invasive  carcinoma. Breast biomarkers will not be performed on the above specimens and have been ordered on the concurrent left breast biopsy ([id number]) with invasive mammary carcinoma. Pathology results and recommendations below were discussed with patient by telephone on 07/21/2021. Patient reported biopsy site within normal limits with slight tenderness at the site. Post biopsy care instructions were reviewed, questions were answered and my direct phone number was provided to patient. Patient was instructed to call [facility] if any concerns or questions arise related to the biopsy. Recommendations: 1. Surgical consultation for LEFT breast biopsy sites 1, 2, and 4 only. Request for surgical consultation relayed to Al Pimple RN and Tanya Nones RN at [facility] by Electa Sniff RN on 07/21/2021. 2. Consider bilateral breast MRI given high grade DCIS and evaluate extent of breast disease. Pathology results reported by Electa Sniff RN on 07/24/2021. Electronically Signed   By: Franki Cabot M.D.   On: 07/24/2021 11:57   Result Date: 07/24/2021 CLINICAL  DATA:  Patient with highly suspicious calcifications located within the upper-outer quadrant of the LEFT breast, with a segmental distribution. Patient presents today for stereotactic biopsies of the posterior and anterior aspects of these calcifications. Patient also with indeterminate calcifications in the RIGHT breast for which stereotactic biopsy will be performed today. Ultrasound-guided biopsy of a LEFT breast mass at the 1 o'clock axis was performed earlier same day with separate biopsy report and separate pathology orders in EPIC. EXAM: LEFT BREAST STEREOTACTIC CORE NEEDLE BIOPSY x2 RIGHT BREAST STEREOTACTIC CORE NEEDLE BIOPSY x1 COMPARISON:  Previous exams. FINDINGS: The patient and I discussed the procedure of stereotactic-guided biopsy including benefits and alternatives. We discussed the high likelihood of a successful procedure. We discussed the risks of the procedure including infection, bleeding, tissue injury, clip migration, and inadequate sampling. Informed written consent was given. The usual time out protocol was performed immediately prior to the procedure. Site 1 (labeled specimen #2): Using sterile technique and 1% Lidocaine as local anesthetic, under stereotactic guidance, a 9 gauge vacuum assisted device was used to perform core needle biopsy of calcifications in the upper outer quadrant of the left breast (posterior aspect) using a superior approach. Specimen radiograph was performed showing calcifications. Specimens with calcifications are identified for pathology. Lesion quadrant: Upper outer quadrant At the conclusion of the procedure, X shaped tissue marker clip was deployed into the biopsy cavity. Site 2 (labeled specimen #3): Using sterile technique and 1% Lidocaine as local anesthetic, under stereotactic guidance, a 9 gauge vacuum assisted device was used to perform core needle biopsy of calcifications in the upper outer quadrant of the left breast (anterior aspect) using a superior  approach. Specimen radiograph was performed showing calcifications. Specimens with calcifications are identified for pathology. Lesion quadrant: Upper outer quadrant At the conclusion of the procedure, coil shaped tissue marker clip was deployed into the biopsy cavity. Site 3 (labeled specimen #4): Using sterile technique and 1% Lidocaine as local anesthetic, under stereotactic guidance, a 9 gauge vacuum assisted device was used to perform core needle biopsy of calcifications in the upper outer quadrant of the RIGHT breast using a superior approach. Specimen radiograph was performed showing calcifications. Specimens with calcifications are identified for pathology. Lesion quadrant: Upper outer quadrant At the conclusion of the procedure, ribbon shaped tissue marker clip was deployed into the biopsy cavity. Follow-up 2-view mammogram was performed and dictated separately. IMPRESSION: 1. Stereotactic-guided biopsy of calcifications in the upper-outer quadrant of the LEFT breast (POSTERIOR aspect). Labeled specimen #2 -  X clip - no apparent complications. 2. Stereotactic-guided biopsy of calcifications in the upper-outer quadrant of the LEFT breast (ANTERIOR aspect). Labeled specimen #3 - Coil clip - no apparent complications. 3. Stereotactic-guided biopsy of calcifications in the upper-outer quadrant of the RIGHT breast. Labeled specimen #4 Ribbon clip-no apparent complications. 4. Earlier ultrasound-guided biopsy of a left breast mass at the 1 o'clock axis was labeled specimen #1-with ribbon clip placed at the biopsy site. Electronically Signed: By: Franki Cabot M.D. On: 07/20/2021 09:40   MM LT BREAST BX W LOC DEV EA AD LESION IMG BX SPEC STEREO GUIDE  Addendum Date: 07/24/2021   ADDENDUM REPORT: 07/24/2021 11:57 ADDENDUM: PATHOLOGY revealed: Site 1. BREAST CALCIFICATIONS, LEFT UPPER OUTER POSTERIOR; STEREOTACTIC BIOPSY (X CLIP): - HIGH-GRADE DUCTAL CARCINOMA IN SITU (DCIS), COMEDO TYPE WITH ASSOCIATED  CALCIFICATIONS. - FOCUS SUSPICIOUS FOR MICRO INVASIVE CARCINOMA, SEE COMMENT. Pathology results are CONCORDANT with imaging findings, per Dr. Franki Cabot. PATHOLOGY revealed: Site 2. BREAST CALCIFICATIONS, LEFT UPPER OUTER ANTERIOR; STEREOTACTIC BIOPSY (COIL CLIP): - HIGH-GRADE DUCTAL CARCINOMA IN SITU (DCIS), COMEDO TYPE WITH ASSOCIATED CALCIFICATIONS. Pathology results are CONCORDANT with imaging findings, per Dr. Franki Cabot. PATHOLOGY revealed: Site 3. BREAST CALCIFICATIONS, RIGHT UPPER OUTER; STEREOTACTIC BIOPSY (RIBBON CLIP): - PREVIOUS SURGICAL SITE CHANGE WITH ORGANIZING FAT NECROSIS WITH ASSOCIATED CALCIFICATIONS. - NEGATIVE FOR ATYPIA AND MALIGNANCY. Pathology results are CONCORDANT with imaging findings, per Dr. Franki Cabot. PATHOLOGY revealed: Site 4. BREAST MASS, LEFT 1:00 6 CM FN; ULTRASOUND-GUIDED BIOPSY (RIBBON CLIP): - INVASIVE MAMMARY CARCINOMA, NO SPECIAL TYPE. Size of invasive carcinoma: 3 mm in this sample Grade 2. Ductal carcinoma in situ: Present, high-grade comedo type. Lymphovascular invasion: Not identified. Pathology results are CONCORDANT with imaging findings, per Dr. Franki Cabot. Comment: In specimen #1 there is a single 0.07 mm focus that is suspicious for micro invasive carcinoma. Breast biomarkers will not be performed on the above specimens and have been ordered on the concurrent left breast biopsy ([id number]) with invasive mammary carcinoma. Pathology results and recommendations below were discussed with patient by telephone on 07/21/2021. Patient reported biopsy site within normal limits with slight tenderness at the site. Post biopsy care instructions were reviewed, questions were answered and my direct phone number was provided to patient. Patient was instructed to call [facility] if any concerns or questions arise related to the biopsy. Recommendations: 1. Surgical consultation for LEFT breast biopsy sites 1, 2, and 4 only. Request for surgical consultation  relayed to Al Pimple RN and Tanya Nones RN at [facility] by Electa Sniff RN on 07/21/2021. 2. Consider bilateral breast MRI given high grade DCIS and evaluate extent of breast disease. Pathology results reported by Electa Sniff RN on 07/24/2021. Electronically Signed   By: Franki Cabot M.D.   On: 07/24/2021 11:57   Result Date: 07/24/2021 CLINICAL DATA:  Patient with highly suspicious calcifications located within the upper-outer quadrant of the LEFT breast, with a segmental distribution. Patient presents today for stereotactic biopsies of the posterior and anterior aspects of these calcifications. Patient also with indeterminate calcifications in the RIGHT breast for which stereotactic biopsy will be performed today. Ultrasound-guided biopsy of a LEFT breast mass at the 1 o'clock axis was performed earlier same day with separate biopsy report and separate pathology orders in EPIC. EXAM: LEFT BREAST STEREOTACTIC CORE NEEDLE BIOPSY x2 RIGHT BREAST STEREOTACTIC CORE NEEDLE BIOPSY x1 COMPARISON:  Previous exams. FINDINGS: The patient and I discussed the procedure of stereotactic-guided biopsy including benefits and alternatives. We discussed the  high likelihood of a successful procedure. We discussed the risks of the procedure including infection, bleeding, tissue injury, clip migration, and inadequate sampling. Informed written consent was given. The usual time out protocol was performed immediately prior to the procedure. Site 1 (labeled specimen #2): Using sterile technique and 1% Lidocaine as local anesthetic, under stereotactic guidance, a 9 gauge vacuum assisted device was used to perform core needle biopsy of calcifications in the upper outer quadrant of the left breast (posterior aspect) using a superior approach. Specimen radiograph was performed showing calcifications. Specimens with calcifications are identified for pathology. Lesion quadrant: Upper outer quadrant At the conclusion of the  procedure, X shaped tissue marker clip was deployed into the biopsy cavity. Site 2 (labeled specimen #3): Using sterile technique and 1% Lidocaine as local anesthetic, under stereotactic guidance, a 9 gauge vacuum assisted device was used to perform core needle biopsy of calcifications in the upper outer quadrant of the left breast (anterior aspect) using a superior approach. Specimen radiograph was performed showing calcifications. Specimens with calcifications are identified for pathology. Lesion quadrant: Upper outer quadrant At the conclusion of the procedure, coil shaped tissue marker clip was deployed into the biopsy cavity. Site 3 (labeled specimen #4): Using sterile technique and 1% Lidocaine as local anesthetic, under stereotactic guidance, a 9 gauge vacuum assisted device was used to perform core needle biopsy of calcifications in the upper outer quadrant of the RIGHT breast using a superior approach. Specimen radiograph was performed showing calcifications. Specimens with calcifications are identified for pathology. Lesion quadrant: Upper outer quadrant At the conclusion of the procedure, ribbon shaped tissue marker clip was deployed into the biopsy cavity. Follow-up 2-view mammogram was performed and dictated separately. IMPRESSION: 1. Stereotactic-guided biopsy of calcifications in the upper-outer quadrant of the LEFT breast (POSTERIOR aspect). Labeled specimen #2 - X clip - no apparent complications. 2. Stereotactic-guided biopsy of calcifications in the upper-outer quadrant of the LEFT breast (ANTERIOR aspect). Labeled specimen #3 - Coil clip - no apparent complications. 3. Stereotactic-guided biopsy of calcifications in the upper-outer quadrant of the RIGHT breast. Labeled specimen #4 Ribbon clip-no apparent complications. 4. Earlier ultrasound-guided biopsy of a left breast mass at the 1 o'clock axis was labeled specimen #1-with ribbon clip placed at the biopsy site. Electronically Signed: By: Franki Cabot M.D. On: 07/20/2021 09:40   [place] LT BREAST BX W LOC DEV 1ST LESION IMG BX SPEC US GUIDE  Addendum Date: 07/24/2021   ADDENDUM REPORT: 07/24/2021 11:52 ADDENDUM: PATHOLOGY revealed: Site 1. BREAST CALCIFICATIONS, LEFT UPPER OUTER POSTERIOR; STEREOTACTIC BIOPSY (X CLIP): - HIGH-GRADE DUCTAL CARCINOMA IN SITU (DCIS), COMEDO TYPE WITH ASSOCIATED CALCIFICATIONS. - FOCUS SUSPICIOUS FOR MICRO INVASIVE CARCINOMA, SEE COMMENT. Pathology results are CONCORDANT with imaging findings, per Dr. Franki Cabot. PATHOLOGY revealed: Site 2. BREAST CALCIFICATIONS, LEFT UPPER OUTER ANTERIOR; STEREOTACTIC BIOPSY (COIL CLIP): - HIGH-GRADE DUCTAL CARCINOMA IN SITU (DCIS), COMEDO TYPE WITH ASSOCIATED CALCIFICATIONS. Pathology results are CONCORDANT with imaging findings, per Dr. Franki Cabot. PATHOLOGY revealed: Site 3. BREAST CALCIFICATIONS, RIGHT UPPER OUTER; STEREOTACTIC BIOPSY (RIBBON CLIP): - PREVIOUS SURGICAL SITE CHANGE WITH ORGANIZING FAT NECROSIS WITH ASSOCIATED CALCIFICATIONS. - NEGATIVE FOR ATYPIA AND MALIGNANCY. Pathology results are CONCORDANT with imaging findings, per Dr. Franki Cabot. PATHOLOGY revealed: Site 4. BREAST MASS, LEFT 1:00 6 CM FN; ULTRASOUND-GUIDED BIOPSY (RIBBON CLIP): - INVASIVE MAMMARY CARCINOMA, NO SPECIAL TYPE. Size of invasive carcinoma: 3 mm in this sample Grade 2. Ductal carcinoma in situ: Present, high-grade comedo type. Lymphovascular invasion: Not identified. Pathology results are  CONCORDANT with imaging findings, per Dr. Franki Cabot. Comment: In specimen #1 there is a single 0.07 mm focus that is suspicious for micro invasive carcinoma. Breast biomarkers will not be performed on the above specimens and have been ordered on the concurrent left breast biopsy ([id number]) with invasive mammary carcinoma. Pathology results and recommendations below were discussed with patient by telephone on 07/21/2021. Patient reported biopsy site within normal limits with slight tenderness at the site. Post  biopsy care instructions were reviewed, questions were answered and my direct phone number was provided to patient. Patient was instructed to call [facility] if any concerns or questions arise related to the biopsy. Recommendations: 1. Surgical consultation for LEFT breast biopsy sites 1, 2, and 4 only. Request for surgical consultation relayed to Al Pimple RN and Tanya Nones RN at [facility] by Electa Sniff RN on 07/21/2021. 2. Consider bilateral breast MRI given high grade DCIS and evaluate extent of breast disease. Pathology results reported by Electa Sniff RN on 07/24/2021. Electronically Signed   By: Franki Cabot M.D.   On: 07/24/2021 11:52   Result Date: 07/24/2021 CLINICAL DATA:  Patient with a LEFT breast mass presents today for ultrasound-guided core biopsy. Bilateral stereotactic biopsies will be performed later today for additional suspicious calcifications within each breast. EXAM: ULTRASOUND GUIDED LEFT BREAST CORE NEEDLE BIOPSY COMPARISON:  Previous exam(s). PROCEDURE: I met with the patient and we discussed the procedure of ultrasound-guided biopsy, including benefits and alternatives. We discussed the high likelihood of a successful procedure. We discussed the risks of the procedure, including infection, bleeding, tissue injury, clip migration, and inadequate sampling. Informed written consent was given. The usual time-out protocol was performed immediately prior to the procedure. Lesion quadrant: Upper outer quadrant Using sterile technique and 1% Lidocaine as local anesthetic, under direct ultrasound visualization, a 12 gauge spring-loaded device was used to perform biopsy of the LEFT breast mass at the 1 o'clock axis, 6 cm from the nipple, using a lateral approach. At the conclusion of the procedure ribbon shaped tissue marker clip was deployed into the biopsy cavity. Follow up 2 view mammogram was performed and dictated separately. IMPRESSION: Ultrasound guided  biopsy of the LEFT breast mass at the 1 o'clock axis. Ribbon shaped clip placed at the biopsy site. No apparent complications. Electronically Signed: By: Franki Cabot M.D. On: 07/20/2021 08:32   MM RT BREAST BX W LOC DEV 1ST LESION IMAGE BX SPEC STEREO GUIDE  Addendum Date: 07/24/2021   ADDENDUM REPORT: 07/24/2021 11:57 ADDENDUM: PATHOLOGY revealed: Site 1. BREAST CALCIFICATIONS, LEFT UPPER OUTER POSTERIOR; STEREOTACTIC BIOPSY (X CLIP): - HIGH-GRADE DUCTAL CARCINOMA IN SITU (DCIS), COMEDO TYPE WITH ASSOCIATED CALCIFICATIONS. - FOCUS SUSPICIOUS FOR MICRO INVASIVE CARCINOMA, SEE COMMENT. Pathology results are CONCORDANT with imaging findings, per Dr. Franki Cabot. PATHOLOGY revealed: Site 2. BREAST CALCIFICATIONS, LEFT UPPER OUTER ANTERIOR; STEREOTACTIC BIOPSY (COIL CLIP): - HIGH-GRADE DUCTAL CARCINOMA IN SITU (DCIS), COMEDO TYPE WITH ASSOCIATED CALCIFICATIONS. Pathology results are CONCORDANT with imaging findings, per Dr. Franki Cabot. PATHOLOGY revealed: Site 3. BREAST CALCIFICATIONS, RIGHT UPPER OUTER; STEREOTACTIC BIOPSY (RIBBON CLIP): - PREVIOUS SURGICAL SITE CHANGE WITH ORGANIZING FAT NECROSIS WITH ASSOCIATED CALCIFICATIONS. - NEGATIVE FOR ATYPIA AND MALIGNANCY. Pathology results are CONCORDANT with imaging findings, per Dr. Franki Cabot. PATHOLOGY revealed: Site 4. BREAST MASS, LEFT 1:00 6 CM FN; ULTRASOUND-GUIDED BIOPSY (RIBBON CLIP): - INVASIVE MAMMARY CARCINOMA, NO SPECIAL TYPE. Size of invasive carcinoma: 3 mm in this sample Grade 2. Ductal carcinoma in situ: Present, high-grade comedo type.  Lymphovascular invasion: Not identified. Pathology results are CONCORDANT with imaging findings, per Dr. Franki Cabot. Comment: In specimen #1 there is a single 0.07 mm focus that is suspicious for micro invasive carcinoma. Breast biomarkers will not be performed on the above specimens and have been ordered on the concurrent left breast biopsy ([id number]) with invasive mammary carcinoma. Pathology results and  recommendations below were discussed with patient by telephone on 07/21/2021. Patient reported biopsy site within normal limits with slight tenderness at the site. Post biopsy care instructions were reviewed, questions were answered and my direct phone number was provided to patient. Patient was instructed to call [facility] if any concerns or questions arise related to the biopsy. Recommendations: 1. Surgical consultation for LEFT breast biopsy sites 1, 2, and 4 only. Request for surgical consultation relayed to Al Pimple RN and Tanya Nones RN at [facility] by Electa Sniff RN on 07/21/2021. 2. Consider bilateral breast MRI given high grade DCIS and evaluate extent of breast disease. Pathology results reported by Electa Sniff RN on 07/24/2021. Electronically Signed   By: Franki Cabot M.D.   On: 07/24/2021 11:57   Result Date: 07/24/2021 CLINICAL DATA:  Patient with highly suspicious calcifications located within the upper-outer quadrant of the LEFT breast, with a segmental distribution. Patient presents today for stereotactic biopsies of the posterior and anterior aspects of these calcifications. Patient also with indeterminate calcifications in the RIGHT breast for which stereotactic biopsy will be performed today. Ultrasound-guided biopsy of a LEFT breast mass at the 1 o'clock axis was performed earlier same day with separate biopsy report and separate pathology orders in EPIC. EXAM: LEFT BREAST STEREOTACTIC CORE NEEDLE BIOPSY x2 RIGHT BREAST STEREOTACTIC CORE NEEDLE BIOPSY x1 COMPARISON:  Previous exams. FINDINGS: The patient and I discussed the procedure of stereotactic-guided biopsy including benefits and alternatives. We discussed the high likelihood of a successful procedure. We discussed the risks of the procedure including infection, bleeding, tissue injury, clip migration, and inadequate sampling. Informed written consent was given. The usual time out protocol was performed  immediately prior to the procedure. Site 1 (labeled specimen #2): Using sterile technique and 1% Lidocaine as local anesthetic, under stereotactic guidance, a 9 gauge vacuum assisted device was used to perform core needle biopsy of calcifications in the upper outer quadrant of the left breast (posterior aspect) using a superior approach. Specimen radiograph was performed showing calcifications. Specimens with calcifications are identified for pathology. Lesion quadrant: Upper outer quadrant At the conclusion of the procedure, X shaped tissue marker clip was deployed into the biopsy cavity. Site 2 (labeled specimen #3): Using sterile technique and 1% Lidocaine as local anesthetic, under stereotactic guidance, a 9 gauge vacuum assisted device was used to perform core needle biopsy of calcifications in the upper outer quadrant of the left breast (anterior aspect) using a superior approach. Specimen radiograph was performed showing calcifications. Specimens with calcifications are identified for pathology. Lesion quadrant: Upper outer quadrant At the conclusion of the procedure, coil shaped tissue marker clip was deployed into the biopsy cavity. Site 3 (labeled specimen #4): Using sterile technique and 1% Lidocaine as local anesthetic, under stereotactic guidance, a 9 gauge vacuum assisted device was used to perform core needle biopsy of calcifications in the upper outer quadrant of the RIGHT breast using a superior approach. Specimen radiograph was performed showing calcifications. Specimens with calcifications are identified for pathology. Lesion quadrant: Upper outer quadrant At the conclusion of the procedure, ribbon shaped tissue marker clip was deployed  into the biopsy cavity. Follow-up 2-view mammogram was performed and dictated separately. IMPRESSION: 1. Stereotactic-guided biopsy of calcifications in the upper-outer quadrant of the LEFT breast (POSTERIOR aspect). Labeled specimen #2 - X clip - no apparent  complications. 2. Stereotactic-guided biopsy of calcifications in the upper-outer quadrant of the LEFT breast (ANTERIOR aspect). Labeled specimen #3 - Coil clip - no apparent complications. 3. Stereotactic-guided biopsy of calcifications in the upper-outer quadrant of the RIGHT breast. Labeled specimen #4 Ribbon clip-no apparent complications. 4. Earlier ultrasound-guided biopsy of a left breast mass at the 1 o'clock axis was labeled specimen #1-with ribbon clip placed at the biopsy site. Electronically Signed: By: Franki Cabot M.D. On: 07/20/2021 09:40    ASSESSMENT: Left breast DCIS.  PLAN:    Left breast DCIS: Pathology and imaging reviewed.  Patient does not have an invasive component on 2 separate biopsies of the left breast.  Right breast biopsy was negative for malignancy.  Patient had consultation with surgery earlier this week and is elected to pursue total mastectomy which is scheduled for August 16, 2021.  Because of this, she would not require adjuvant XRT.  Given the noninvasive nature, chemotherapy also would not offer benefit.  Malignancy is ER/PR positive, therefore it is recommended patient take tamoxifen for total 5 years.  Return to clinic approximately 2 weeks after surgery to discuss her final pathology results and initiation of tamoxifen.  Patient expressed understanding and was in agreement with this plan. She also understands that She can call clinic at any time with any questions, concerns, or complaints.   Cancer Staging Ductal carcinoma in situ (DCIS) of left breast Staging form: Breast, AJCC 8th Edition - Clinical stage from 07/27/2021: Stage 0 (cTis (DCIS), cN0, cM0, ER+, PR+, HER2-) - Signed by Lloyd Huger, MD on 07/27/2021  Lloyd Huger, MD   07/27/2021 5:23 PM

## 2021-07-27 NOTE — Telephone Encounter (Signed)
Patient is now informed of all dates regarding her surgery and information given.  Patient verbalized understanding.

## 2021-08-05 ENCOUNTER — Emergency Department: Payer: 59

## 2021-08-05 ENCOUNTER — Other Ambulatory Visit: Payer: Self-pay

## 2021-08-05 ENCOUNTER — Emergency Department
Admission: EM | Admit: 2021-08-05 | Discharge: 2021-08-05 | Disposition: A | Discharge status: Home / Self Care | Payer: 59 | Attending: Emergency Medicine | Admitting: Emergency Medicine

## 2021-08-05 DIAGNOSIS — I10 Essential (primary) hypertension: Secondary | ICD-10-CM | POA: Insufficient documentation

## 2021-08-05 DIAGNOSIS — E039 Hypothyroidism, unspecified: Secondary | ICD-10-CM | POA: Insufficient documentation

## 2021-08-05 DIAGNOSIS — M10031 Idiopathic gout, right wrist: Secondary | ICD-10-CM | POA: Insufficient documentation

## 2021-08-05 DIAGNOSIS — M25531 Pain in right wrist: Secondary | ICD-10-CM | POA: Diagnosis present

## 2021-08-05 DIAGNOSIS — Z853 Personal history of malignant neoplasm of breast: Secondary | ICD-10-CM | POA: Diagnosis not present

## 2021-08-05 DIAGNOSIS — Z87891 Personal history of nicotine dependence: Secondary | ICD-10-CM | POA: Insufficient documentation

## 2021-08-05 DIAGNOSIS — E1140 Type 2 diabetes mellitus with diabetic neuropathy, unspecified: Secondary | ICD-10-CM | POA: Diagnosis not present

## 2021-08-05 DIAGNOSIS — Z7984 Long term (current) use of oral hypoglycemic drugs: Secondary | ICD-10-CM | POA: Diagnosis not present

## 2021-08-05 DIAGNOSIS — Z79899 Other long term (current) drug therapy: Secondary | ICD-10-CM | POA: Insufficient documentation

## 2021-08-05 DIAGNOSIS — Z7982 Long term (current) use of aspirin: Secondary | ICD-10-CM | POA: Insufficient documentation

## 2021-08-05 LAB — BASIC METABOLIC PANEL
Anion gap: 16 — ABNORMAL HIGH (ref 5–15)
BUN: 25 mg/dL — ABNORMAL HIGH (ref 8–23)
CO2: 27 mmol/L (ref 22–32)
Calcium: 8.7 mg/dL — ABNORMAL LOW (ref 8.9–10.3)
Chloride: 95 mmol/L — ABNORMAL LOW (ref 98–111)
Creatinine, Ser: 1.07 mg/dL — ABNORMAL HIGH (ref 0.44–1.00)
GFR, Estimated: 59 mL/min — ABNORMAL LOW (ref >60)
Glucose, Bld: 283 mg/dL — ABNORMAL HIGH (ref 70–99)
Potassium: 3.5 mmol/L (ref 3.5–5.1)
Sodium: 138 mmol/L (ref 135–145)

## 2021-08-05 LAB — CBC
HCT: 37.6 % (ref 36.0–46.0)
Hemoglobin: 12.4 g/dL (ref 12.0–15.0)
MCH: 27.1 pg (ref 26.0–34.0)
MCHC: 33 g/dL (ref 30.0–36.0)
MCV: 82.1 fL (ref 80.0–100.0)
Platelets: 114 10*3/uL — ABNORMAL LOW (ref 150–400)
RBC: 4.58 MIL/uL (ref 3.87–5.11)
RDW: 14.5 % (ref 11.5–15.5)
WBC: 7.2 10*3/uL (ref 4.0–10.5)
nRBC: 0 % (ref 0.0–0.2)

## 2021-08-05 LAB — URIC ACID: Uric Acid, Serum: 8.3 mg/dL — ABNORMAL HIGH (ref 2.5–7.1)

## 2021-08-05 MED ORDER — HYDROCODONE-ACETAMINOPHEN 5-325 MG PO TABS: 1.0000 | ORAL_TABLET | Freq: Three times a day (TID) | ORAL | 0 refills | Status: DC | PRN

## 2021-08-05 MED ORDER — DICLOFENAC SODIUM 50 MG PO TBEC: 50.0000 mg | DELAYED_RELEASE_TABLET | Freq: Two times a day (BID) | ORAL | 0 refills | Status: AC

## 2021-08-05 NOTE — Discharge Instructions (Addendum)
Take the prescription meds as directed. Wear the brace as needed. Follow-up with your provider for ongoing management.

## 2021-08-05 NOTE — ED Triage Notes (Signed)
Pt comes pov with some right wrist redness and swelling. UC sent pt here for possible infection.

## 2021-08-05 NOTE — ED Notes (Signed)
See triage note - pt presents with right wrist pain, denies injury, states "It is probably just a flare up of my gout".

## 2021-08-05 NOTE — ED Provider Notes (Signed)
[facility] Emergency Department Provider Note ____________________________________________  Time seen: 1208  I have reviewed the triage vital signs and the nursing notes.  HISTORY  Chief Complaint  Wrist Pain   HPI Emily Tapia is a 60 y.o. right-handed female with the below medical history, presents to the ED for some acute right pain redness, swelling, disability.  Patient denies any injury, trauma or fall.  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  Presents today doing to increase disability at the wrist patient denies any history of transient joint pain, swelling, or gout.  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   Pelvic mass 07/08/2015   Hyperlipidemia, mixed 07/08/2015   Hx of iron deficiency 07/08/2015   Absolute anemia 04/27/2015   Diabetic peripheral neuropathy associated with type 2 diabetes mellitus ([place]) 04/27/2015   Breath shortness 04/27/2015   Accumulation of fluid in tissues 04/27/2015   Cardiac murmur 04/27/2015   HLD (hyperlipidemia) 04/27/2015   Decreased potassium in the blood 04/27/2015   Hypothyroidism 04/27/2015   Obstructive sleep apnea 04/27/2015   Disorder of peripheral nervous system 04/27/2015   Obstructive apnea 04/27/2015   Pain in shoulder 04/27/2015   Diabetes mellitus, type 2 ([place]) 04/27/2015   Fibroid uterus 10/25/2013   Aortic valve stenosis 05/01/2011   Edema 03/27/2011   SOB (shortness of breath) 03/27/2011   Diabetes mellitus ([place]) 03/27/2011   HTN (hypertension) 03/27/2011   Essential (primary) hypertension 07/05/2006   Anxiety, generalized 07/05/2006    Past Surgical History:  Procedure Laterality Date    ABDOMINAL HYSTERECTOMY  11/17/2013   BREAST BIOPSY     x2   BREAST BIOPSY Left 07/20/2021   [place] Bx, Ribbon Clip, Path pending   BREAST BIOPSY Left 07/20/2021   Stereo Bx, X-clip, path pending   BREAST BIOPSY Left 07/20/2021   Stereo biopsy, coil clip, path pending   BREAST BIOPSY Right 07/20/2021   Stereo Bx, Ribbon Clip, path pending   CESAREAN SECTION     x 2   CHOLECYSTECTOMY  12/17/2001   DUE TO STONES   DILATION AND CURETTAGE OF UTERUS     ELBOW SURGERY     POLYPECTOMY  12/17/1993    Prior to Admission medications   Medication Sig Start Date End Date Taking? Authorizing Provider  diclofenac (VOLTAREN) 50 MG EC tablet Take 1 tablet (50 mg total) by mouth 2 (two) times daily. 08/05/21 09/04/21 Yes Ahri Olson, Dannielle Karvonen, PA-C  HYDROcodone-acetaminophen (NORCO) 5-325 MG tablet Take 1 tablet by mouth 3 (three) times daily as needed. 08/05/21  Yes Guerin Lashomb, Dannielle Karvonen, PA-C  Ascorbic Acid (VITAMIN C PO) Take 1 tablet by mouth daily.    [provider]  aspirin 81 MG EC tablet Take 81 mg by mouth at bedtime.    [provider]  carvedilol (COREG) 25 MG tablet Take 1 tablet (25 mg total) by mouth 2 (two) times daily. 11/28/20 08/01/21  Theora Gianotti, NP  Cholecalciferol (VITAMIN D3) 1000 UNITS CAPS Take 1,000 Units by mouth in the morning.    [provider]  Dulaglutide (TRULICITY) 3 0000000 SOPN Inject 3 mg as directed once a week. 12/05/20   Chrismon, Dennis E, PA-C  EUTHYROX 75 MCG tablet TAKE 1 TABLET BY MOUTH ONCE DAILY BEFORE BREAKFAST .OFFICE  VIST  NEEDED  BEFORE  NEXT  REFILL  IS  DUE 10/23/20   Mar Daring, PA-C  Ferrous Sulfate (IRON) 325 (65 Fe) MG TABS Take 1 tablet by mouth once daily with breakfast 04/24/19   Chrismon, Simona Huh E, PA-C  furosemide (LASIX) 40 MG tablet TAKE 1 TABLET BY MOUTH ONCE DAILY AS NEEDED FOR FLUID OR EDEMA 05/26/21   Chrismon, Simona Huh E, PA-C  glucose blood (CONTOUR NEXT TEST) test strip Test fasting sugar  each morning. Recheck if having hypoglycemic symptoms. 10/03/18   Chrismon, Simona Huh E, PA-C  glyBURIDE (DIABETA) 5 MG tablet TAKE 1 TABLET BY MOUTH TWICE DAILY WITH MEALS. 04/24/21   Chrismon, Vickki Muff, PA-C  hydrochlorothiazide (HYDRODIURIL) 25 MG tablet Take 1 tablet (25 mg total) by mouth daily. 06/26/21 09/24/21  [name], MD  lisinopril (ZESTRIL) 40 MG tablet Take 1 tablet (40 mg total) by mouth daily. 06/26/21 09/24/21  [name], MD  meclizine (ANTIVERT) 25 MG tablet Take 1 tablet (25 mg total) by mouth 3 (three) times daily as needed for dizziness. Patient not taking: Reported on 08/01/2021 01/03/20   Harvest Dark, MD  metFORMIN (GLUCOPHAGE) 1000 MG tablet Take 1 tablet (1,000 mg total) by mouth 2 (two) times daily with a meal. 12/05/20   Chrismon, Vickki Muff, PA-C  Multiple Vitamin (MULTIVITAMIN) tablet Take 1 tablet by mouth every morning.    [provider]  Multiple Vitamins-Minerals (ZINC PO) Take 1 tablet by mouth daily.    [provider]  PARoxetine (PAXIL) 20 MG tablet Take 1 tablet (20 mg total) by mouth daily. 07/24/21   Chrismon, Vickki Muff, PA-C    Allergies Patient has no known allergies.  Family History  Problem Relation Age of Onset   Hyperlipidemia Mother    Thyroid disease Mother    Hashimoto's thyroiditis Mother    Heart disease Father        open heart surgery   Heart failure Father    Diabetes Father    Dementia Father    Thyroid disease Brother    Breast cancer Maternal Grandmother    Diabetes Paternal Grandmother    Prostate cancer Paternal Grandfather     Social History Social History   Tobacco Use   Smoking status: Former    Packs/day: 1.50    Years: 25.00    Pack years: 37.50    Types: Cigarettes    Quit date: 11/24/2001    Years since quitting: 19.7   Smokeless tobacco: Never  Vaping Use   Vaping Use: Never used  Substance Use Topics   Alcohol use: Yes    Alcohol/week: 0.0 standard drinks    Comment:  OCCASIONALLY DRINKS WINE, ONCE A WEEK   Drug use: No    Review of Systems  Constitutional: Negative for fever. Cardiovascular: Negative for chest pain. Respiratory: Negative for shortness of breath. Gastrointestinal: Negative for abdominal pain, vomiting and diarrhea. Genitourinary: Negative for dysuria. Musculoskeletal: Negative for back pain.  Right wrist pain as above. Skin: Negative for rash. Neurological: Negative for headaches, focal weakness or numbness. ____________________________________________  PHYSICAL EXAM:  VITAL SIGNS: ED Triage Vitals [08/05/21 0946]  Enc Vitals Group     BP (!) 138/57     Pulse Rate 75     Resp 18  Temp 98 F (36.7 C)     Temp Source Oral     SpO2 96 %     Weight 192 lb (87.1 kg)     Height '5\' 4"'$  (1.626 m)     Head Circumference      Peak Flow      Pain Score 6     Pain Loc      Pain Edu?      Excl. in [place]?     Constitutional: Alert and oriented. Well appearing and in no distress. Head: Normocephalic and atraumatic. Eyes: Conjunctivae are normal. Normal extraocular movements Cardiovascular: Normal rate, regular rhythm. Normal distal pulses. Respiratory: Normal respiratory effort.  Musculoskeletal: Right wrist obvious deformity or dislocation.  She does have significant soft tissue swelling and erythema to the dorsal and volar aspect of the distal wrist.  She is able demonstrate normal composite fist but decreased flexion extension range secondary to pain.  Nontender with normal range of motion in all extremities.  Neurologic:  Normal gait without ataxia. Normal speech and language. No gross focal neurologic deficits are appreciated. Skin:  Skin is warm, dry and intact. No rash noted.  Skin over the right wrist is erythematous and warm to touch. Psychiatric: Mood and affect are normal. Patient exhibits appropriate insight and judgment. ____________________________________________    {LABS (pertinent positives/negatives)  Labs  Reviewed  URIC ACID - Abnormal; Notable for the following components:      Result Value   Uric Acid, Serum 8.3 (*)    All other components within normal limits  CBC - Abnormal; Notable for the following components:   Platelets 114 (*)    All other components within normal limits  BASIC METABOLIC PANEL - Abnormal; Notable for the following components:   Chloride 95 (*)    Glucose, Bld 283 (*)    BUN 25 (*)    Creatinine, Ser 1.07 (*)    Calcium 8.7 (*)    GFR, Estimated 59 (*)    Anion gap 16 (*)    All other components within normal limits  ____________________________________________  {EKG  ____________________________________________   RADIOLOGY Official radiology report(s): DG Wrist Complete Right  Result Date: 08/05/2021 CLINICAL DATA:  RIGHT wrist pain with swelling.  Question infection. EXAM: RIGHT WRIST - COMPLETE 3+ VIEW COMPARISON:  None FINDINGS: No acute fracture or dislocation. Mild osteopenia. Question of mild soft tissue swelling about the area of the ulna. No bony destruction. IMPRESSION: 1. No acute bony abnormality. 2. Question of mild soft tissue swelling about the area of the ulna. Correlate with site of pain. Electronically Signed   By: Zetta Bills M.D.   On: 08/05/2021 10:25   ____________________________________________  PROCEDURES  Velcro wrist splint  Procedures ____________________________________________   INITIAL IMPRESSION / ASSESSMENT AND PLAN / ED COURSE  As part of my medical decision making, I reviewed the following data within the [facility] reviewed as noted, Radiograph reviewed WNL, Notes from prior ED visits, and [place] Controlled Substance Database    DDX: wrist sprain, cellulitis, infectious arthritis, gout, tenosynovitis  Patient ED evaluation of acute right wrist pain, swelling, redness, and disability.  She was evaluated for complaint, with labs that were reassuring except for elevated uric acid at 8.3.  No  x-ray evidence of any acute bony injury, abnormality, or bony erosion.  Patient will be treated empirically with anti-inflammatories and pain medicines but she is placed in a wrist cock-up splint for comfort.  She will follow-up with primary  provider for ongoing symptoms patient given information on gout flares and low purine diets.  Return precautions have been reviewed.  Lasya Polcyn was evaluated in Emergency Department on 08/05/2021 for the symptoms described in the history of present illness. She was evaluated in the context of the global COVID-19 pandemic, which necessitated consideration that the patient might be at risk for infection with the SARS-CoV-2 virus that causes COVID-19. Institutional protocols and algorithms that pertain to the evaluation of patients at risk for COVID-19 are in a state of rapid change based on information released by regulatory bodies including the CDC and federal and state organizations. These policies and algorithms were followed during the patient's care in the ED.  I reviewed the patient's prescription history over the last 12 months in the multi-state controlled substances database(s) that includes [place], [place], [place], [place], [place], [place], [place], [place], [place], [place], [place], [place], [place], and [place].  Results were notable for no RX history. ____________________________________________  FINAL CLINICAL IMPRESSION(S) / ED DIAGNOSES  Final diagnoses:  Acute idiopathic gout of right wrist      Carmie End, Dannielle Karvonen, PA-C 08/05/21 1846    Vanessa [place]forest, MD 08/06/21 [phone]

## 2021-08-07 ENCOUNTER — Encounter
Admission: RE | Admit: 2021-08-07 | Discharge: 2021-08-07 | Disposition: A | Discharge status: Home / Self Care | Payer: 59 | Source: Ambulatory Visit | Attending: Surgery | Admitting: Surgery

## 2021-08-07 ENCOUNTER — Other Ambulatory Visit: Payer: Self-pay

## 2021-08-07 NOTE — Patient Instructions (Addendum)
Your procedure is scheduled on: 08/16/2021  Report to the Registration Desk on the 1st floor of the [facility]. To find out your arrival time, please call [phone] between 1PM - 3PM on: 08/15/2021   REMEMBER: Instructions that are not followed completely may result in serious medical risk, up to and including death; or upon the discretion of your surgeon and anesthesiologist your surgery may need to be rescheduled.  Do not eat food after midnight the night before surgery.  No gum chewing, lozengers or hard candies.  You may however, drink water up to 2 hours before you are scheduled to arrive for your surgery. Do not drink anything within 2 hours of your scheduled arrival time.  Type 1 and Type 2 diabetics should only drink water.  TAKE THESE MEDICATIONS THE MORNING OF SURGERY WITH A SIP OF WATER: Coreg EUTHYROX Paxil       Do not take medications not on the list above.   Stop Metformin  2 days prior to surgery. Last Dose will be  August 28,2022  Please follow the surgeons recommendation regarding stopping aspirin.    One week prior to surgery: Stop Anti-inflammatories (NSAIDS) such as Advil, Aleve, Ibuprofen, Motrin, Naproxen, Naprosyn and Aspirin based products such as Excedrin, Goodys Powder, BC Powder, diclofenac Stop ANY OVER THE COUNTER supplements until after surgery like  Vit C, Vit D3,ferrous sulfate,  multivitamin, zinc You may however, continue to take Tylenol if needed for pain up until the day of surgery.  No Alcohol for 24 hours before or after surgery.  No Smoking including e-cigarettes for 24 hours prior to surgery.  No chewable tobacco products for at least 6 hours prior to surgery.  No nicotine patches on the day of surgery.  Do not use any "recreational" drugs for at least a week prior to your surgery.  Please be advised that the combination of cocaine and anesthesia may have negative outcomes, up to and including death. If you test positive for cocaine,  your surgery will be cancelled.  On the morning of surgery brush your teeth with toothpaste and water, you may rinse your mouth with mouthwash if you wish. Do not swallow any toothpaste or mouthwash.  Do not wear jewelry, make-up, hairpins, clips or nail polish.  Do not wear lotions, powders, or perfumes.   Do not shave body from the neck down 48 hours prior to surgery just in case you cut yourself which could leave a site for infection.  Also, freshly shaved skin may become irritated if using the CHG soap.  Contact lenses, hearing aids and dentures may not be worn into surgery.  Do not bring valuables to the hospital. [facility] is not responsible for any missing/lost belongings or valuables.   Use CHG Soap or wipes as directed on instruction sheet.  Notify your doctor if there is any change in your medical condition (cold, fever, infection).  Wear comfortable clothing (specific to your surgery type) to the hospital.  After surgery, you can help prevent lung complications by doing breathing exercises.  Take deep breaths and cough every 1-2 hours. Your doctor may order a device called an Incentive Spirometer to help you take deep breaths.  If you are being admitted to the hospital overnight, leave your suitcase in the car. After surgery it may be brought to your room.  If you are being discharged the day of surgery, you will not be allowed to drive home. You will need a responsible adult (18 years or  older) to drive you home and stay with you that night.   If you are taking public transportation, you will need to have a responsible adult (18 years or older) with you. Please confirm with your physician that it is acceptable to use public transportation.   Please call the [facility] at [phone] if you have any questions about these instructions.  Surgery Visitation Policy:  Patients undergoing a surgery or procedure may have one family member or support  person with them as long as that person is not COVID-19 positive or experiencing its symptoms.  That person may remain in the waiting area during the procedure.  Inpatient Visitation:    Visiting hours are 7 a.m. to 8 p.m. Inpatients will be allowed two visitors daily. The visitors may change each day during the patient's stay. No visitors under the age of 40. Any visitor under the age of 35 must be accompanied by an adult. The visitor must pass COVID-19 screenings, use hand sanitizer when entering and exiting the patient's room and wear a mask at all times, including in the patient's room. Patients must also wear a mask when staff or their visitor are in the room. Masking is required regardless of vaccination status.

## 2021-08-08 ENCOUNTER — Telehealth: Payer: Self-pay | Admitting: *Deleted

## 2021-08-08 ENCOUNTER — Encounter
Admission: RE | Admit: 2021-08-08 | Discharge: 2021-08-08 | Disposition: A | Discharge status: Home / Self Care | Payer: 59 | Source: Ambulatory Visit | Attending: Surgery | Admitting: Surgery

## 2021-08-08 DIAGNOSIS — Z01818 Encounter for other preprocedural examination: Secondary | ICD-10-CM | POA: Diagnosis present

## 2021-08-08 LAB — CBC WITH DIFFERENTIAL/PLATELET
Abs Immature Granulocytes: 0.02 10*3/uL (ref 0.00–0.07)
Basophils Absolute: 0 10*3/uL (ref 0.0–0.1)
Basophils Relative: 1 %
Eosinophils Absolute: 0.2 10*3/uL (ref 0.0–0.5)
Eosinophils Relative: 3 %
HCT: 38.3 % (ref 36.0–46.0)
Hemoglobin: 12.4 g/dL (ref 12.0–15.0)
Immature Granulocytes: 0 %
Lymphocytes Relative: 34 %
Lymphs Abs: 2.1 10*3/uL (ref 0.7–4.0)
MCH: 26.6 pg (ref 26.0–34.0)
MCHC: 32.4 g/dL (ref 30.0–36.0)
MCV: 82.2 fL (ref 80.0–100.0)
Monocytes Absolute: 0.4 10*3/uL (ref 0.1–1.0)
Monocytes Relative: 6 %
Neutro Abs: 3.5 10*3/uL (ref 1.7–7.7)
Neutrophils Relative %: 56 %
Platelets: 133 10*3/uL — ABNORMAL LOW (ref 150–400)
RBC: 4.66 MIL/uL (ref 3.87–5.11)
RDW: 14 % (ref 11.5–15.5)
WBC: 6.2 10*3/uL (ref 4.0–10.5)
nRBC: 0 % (ref 0.0–0.2)

## 2021-08-08 LAB — COMPREHENSIVE METABOLIC PANEL
ALT: 37 U/L (ref 0–44)
AST: 36 U/L (ref 15–41)
Albumin: 4 g/dL (ref 3.5–5.0)
Alkaline Phosphatase: 55 U/L (ref 38–126)
Anion gap: 9 (ref 5–15)
BUN: 18 mg/dL (ref 8–23)
CO2: 30 mmol/L (ref 22–32)
Calcium: 9.5 mg/dL (ref 8.9–10.3)
Chloride: 99 mmol/L (ref 98–111)
Creatinine, Ser: 0.76 mg/dL (ref 0.44–1.00)
GFR, Estimated: >60 mL/min (ref >60)
Glucose, Bld: 110 mg/dL — ABNORMAL HIGH (ref 70–99)
Potassium: 3.7 mmol/L (ref 3.5–5.1)
Sodium: 138 mmol/L (ref 135–145)
Total Bilirubin: 0.8 mg/dL (ref 0.3–1.2)
Total Protein: 7 g/dL (ref 6.5–8.1)

## 2021-08-08 NOTE — Telephone Encounter (Signed)
Faxed FMLA to [employer] at [phone] 

## 2021-08-15 MED ORDER — GABAPENTIN 300 MG PO CAPS
300.0000 mg | ORAL_CAPSULE | ORAL | Status: AC
  Administered 2021-08-16: 300 mg via ORAL

## 2021-08-15 MED ORDER — CHLORHEXIDINE GLUCONATE CLOTH 2 % EX PADS: 6.0000 | MEDICATED_PAD | Freq: Once | CUTANEOUS | Status: DC

## 2021-08-15 MED ORDER — CEFAZOLIN SODIUM-DEXTROSE 2-4 GM/100ML-% IV SOLN
2.0000 g | INTRAVENOUS | Status: AC
  Administered 2021-08-16: 2 g via INTRAVENOUS

## 2021-08-15 MED ORDER — SODIUM CHLORIDE 0.9 % IV SOLN: INTRAVENOUS | Status: DC

## 2021-08-15 MED ORDER — FAMOTIDINE 20 MG PO TABS
20.0000 mg | ORAL_TABLET | Freq: Once | ORAL | Status: AC
  Administered 2021-08-16: 20 mg via ORAL

## 2021-08-15 MED ORDER — ACETAMINOPHEN 500 MG PO TABS
1000.0000 mg | ORAL_TABLET | ORAL | Status: AC
  Administered 2021-08-16: 1000 mg via ORAL

## 2021-08-15 MED ORDER — CELECOXIB 200 MG PO CAPS
200.0000 mg | ORAL_CAPSULE | ORAL | Status: AC
  Administered 2021-08-16: 200 mg via ORAL

## 2021-08-15 MED ORDER — CHLORHEXIDINE GLUCONATE 0.12 % MT SOLN: 15.0000 mL | Freq: Once | OROMUCOSAL | Status: AC

## 2021-08-15 MED ORDER — ORAL CARE MOUTH RINSE: 15.0000 mL | Freq: Once | OROMUCOSAL | Status: AC

## 2021-08-16 ENCOUNTER — Ambulatory Visit: Payer: 59 | Admitting: Certified Registered"

## 2021-08-16 ENCOUNTER — Encounter: Payer: Self-pay | Admitting: Surgery

## 2021-08-16 ENCOUNTER — Encounter
Admission: RE | Disposition: A | Discharge status: Home / Self Care | Payer: Self-pay | Source: Home / Self Care | Attending: Surgery

## 2021-08-16 ENCOUNTER — Ambulatory Visit
Admission: RE | Admit: 2021-08-16 | Discharge: 2021-08-16 | Disposition: A | Discharge status: Home / Self Care | Payer: 59 | Attending: Surgery | Admitting: Surgery

## 2021-08-16 ENCOUNTER — Ambulatory Visit: Payer: 59

## 2021-08-16 ENCOUNTER — Ambulatory Visit
Admission: RE | Admit: 2021-08-16 | Discharge: 2021-08-16 | Disposition: A | Discharge status: Home / Self Care | Payer: 59 | Source: Ambulatory Visit | Attending: Surgery | Admitting: Surgery

## 2021-08-16 ENCOUNTER — Ambulatory Visit: Payer: 59 | Admitting: Urgent Care

## 2021-08-16 DIAGNOSIS — C50412 Malignant neoplasm of upper-outer quadrant of left female breast: Secondary | ICD-10-CM

## 2021-08-16 DIAGNOSIS — E1142 Type 2 diabetes mellitus with diabetic polyneuropathy: Secondary | ICD-10-CM | POA: Insufficient documentation

## 2021-08-16 DIAGNOSIS — Z803 Family history of malignant neoplasm of breast: Secondary | ICD-10-CM | POA: Diagnosis not present

## 2021-08-16 DIAGNOSIS — Z8249 Family history of ischemic heart disease and other diseases of the circulatory system: Secondary | ICD-10-CM | POA: Insufficient documentation

## 2021-08-16 DIAGNOSIS — C773 Secondary and unspecified malignant neoplasm of axilla and upper limb lymph nodes: Secondary | ICD-10-CM | POA: Insufficient documentation

## 2021-08-16 DIAGNOSIS — Z8349 Family history of other endocrine, nutritional and metabolic diseases: Secondary | ICD-10-CM | POA: Insufficient documentation

## 2021-08-16 DIAGNOSIS — Z87891 Personal history of nicotine dependence: Secondary | ICD-10-CM | POA: Insufficient documentation

## 2021-08-16 DIAGNOSIS — Z8042 Family history of malignant neoplasm of prostate: Secondary | ICD-10-CM | POA: Insufficient documentation

## 2021-08-16 DIAGNOSIS — C50912 Malignant neoplasm of unspecified site of left female breast: Secondary | ICD-10-CM | POA: Diagnosis present

## 2021-08-16 DIAGNOSIS — Z79899 Other long term (current) drug therapy: Secondary | ICD-10-CM | POA: Insufficient documentation

## 2021-08-16 DIAGNOSIS — Z7984 Long term (current) use of oral hypoglycemic drugs: Secondary | ICD-10-CM | POA: Insufficient documentation

## 2021-08-16 DIAGNOSIS — Z170 Estrogen receptor positive status [ER+]: Secondary | ICD-10-CM | POA: Diagnosis not present

## 2021-08-16 DIAGNOSIS — Z833 Family history of diabetes mellitus: Secondary | ICD-10-CM | POA: Diagnosis not present

## 2021-08-16 DIAGNOSIS — Z7982 Long term (current) use of aspirin: Secondary | ICD-10-CM | POA: Insufficient documentation

## 2021-08-16 HISTORY — PX: SIMPLE MASTECTOMY WITH AXILLARY SENTINEL NODE BIOPSY: SHX6098

## 2021-08-16 LAB — GLUCOSE, CAPILLARY
Glucose-Capillary: 151 mg/dL — ABNORMAL HIGH (ref 70–99)
Glucose-Capillary: 196 mg/dL — ABNORMAL HIGH (ref 70–99)

## 2021-08-16 SURGERY — SIMPLE MASTECTOMY WITH AXILLARY SENTINEL NODE BIOPSY
Anesthesia: General | Site: Breast | Laterality: Left

## 2021-08-16 MED ORDER — LACTATED RINGERS IV SOLN: INTRAVENOUS | Status: DC | PRN

## 2021-08-16 MED ORDER — MIDAZOLAM HCL 2 MG/2ML IJ SOLN
INTRAMUSCULAR | Status: AC
  Filled 2021-08-16: qty 2

## 2021-08-16 MED ORDER — GLYCOPYRROLATE 0.2 MG/ML IJ SOLN
INTRAMUSCULAR | Status: DC | PRN
  Administered 2021-08-16: .2 mg via INTRAVENOUS

## 2021-08-16 MED ORDER — HYDROCODONE-ACETAMINOPHEN 5-325 MG PO TABS
ORAL_TABLET | ORAL | Status: AC
  Administered 2021-08-16: 1 via ORAL
  Filled 2021-08-16: qty 1

## 2021-08-16 MED ORDER — METHYLENE BLUE 0.5 % INJ SOLN
INTRAVENOUS | Status: AC
  Filled 2021-08-16: qty 10

## 2021-08-16 MED ORDER — ATROPINE SULFATE 0.4 MG/ML IJ SOLN
INTRAMUSCULAR | Status: DC | PRN
  Administered 2021-08-16: .2 mg via INTRAVENOUS

## 2021-08-16 MED ORDER — HYDROCODONE-ACETAMINOPHEN 5-325 MG PO TABS: 1.0000 | ORAL_TABLET | Freq: Four times a day (QID) | ORAL | 0 refills | Status: DC | PRN

## 2021-08-16 MED ORDER — MIDAZOLAM HCL 2 MG/2ML IJ SOLN
INTRAMUSCULAR | Status: DC | PRN
  Administered 2021-08-16: 2 mg via INTRAVENOUS

## 2021-08-16 MED ORDER — LIDOCAINE HCL (CARDIAC) PF 100 MG/5ML IV SOSY
PREFILLED_SYRINGE | INTRAVENOUS | Status: DC | PRN
  Administered 2021-08-16: 80 mg via INTRAVENOUS

## 2021-08-16 MED ORDER — CHLORHEXIDINE GLUCONATE 0.12 % MT SOLN
OROMUCOSAL | Status: AC
  Administered 2021-08-16: 15 mL via OROMUCOSAL
  Filled 2021-08-16: qty 15

## 2021-08-16 MED ORDER — SODIUM CHLORIDE (PF) 0.9 % IJ SOLN
INTRAMUSCULAR | Status: AC
  Filled 2021-08-16: qty 20

## 2021-08-16 MED ORDER — BUPIVACAINE HCL (PF) 0.25 % IJ SOLN
INTRAMUSCULAR | Status: AC
  Filled 2021-08-16: qty 30

## 2021-08-16 MED ORDER — FENTANYL CITRATE (PF) 100 MCG/2ML IJ SOLN
INTRAMUSCULAR | Status: AC
  Administered 2021-08-16: 25 ug via INTRAVENOUS
  Filled 2021-08-16: qty 2

## 2021-08-16 MED ORDER — VASOPRESSIN 20 UNIT/ML IV SOLN
INTRAVENOUS | Status: DC | PRN
  Administered 2021-08-16 (×6): 1 [IU] via INTRAVENOUS

## 2021-08-16 MED ORDER — PROPOFOL 10 MG/ML IV BOLUS
INTRAVENOUS | Status: AC
  Filled 2021-08-16: qty 20

## 2021-08-16 MED ORDER — ACETAMINOPHEN 500 MG PO TABS
ORAL_TABLET | ORAL | Status: AC
  Filled 2021-08-16: qty 2

## 2021-08-16 MED ORDER — ONDANSETRON HCL 4 MG/2ML IJ SOLN
INTRAMUSCULAR | Status: DC | PRN
  Administered 2021-08-16: 4 mg via INTRAVENOUS

## 2021-08-16 MED ORDER — PHENYLEPHRINE HCL (PRESSORS) 10 MG/ML IV SOLN
INTRAVENOUS | Status: DC | PRN
  Administered 2021-08-16 (×2): 100 ug via INTRAVENOUS

## 2021-08-16 MED ORDER — CEFAZOLIN SODIUM-DEXTROSE 2-4 GM/100ML-% IV SOLN
INTRAVENOUS | Status: AC
  Filled 2021-08-16: qty 100

## 2021-08-16 MED ORDER — FENTANYL CITRATE (PF) 100 MCG/2ML IJ SOLN
INTRAMUSCULAR | Status: AC
  Filled 2021-08-16: qty 2

## 2021-08-16 MED ORDER — EPHEDRINE SULFATE 50 MG/ML IJ SOLN
INTRAMUSCULAR | Status: DC | PRN
  Administered 2021-08-16 (×3): 5 mg via INTRAVENOUS
  Administered 2021-08-16: 10 mg via INTRAVENOUS

## 2021-08-16 MED ORDER — DEXAMETHASONE SODIUM PHOSPHATE 10 MG/ML IJ SOLN
INTRAMUSCULAR | Status: AC
  Filled 2021-08-16: qty 1

## 2021-08-16 MED ORDER — FENTANYL CITRATE (PF) 100 MCG/2ML IJ SOLN
INTRAMUSCULAR | Status: DC | PRN
  Administered 2021-08-16 (×3): 25 ug via INTRAVENOUS

## 2021-08-16 MED ORDER — LIDOCAINE HCL (PF) 2 % IJ SOLN
INTRAMUSCULAR | Status: AC
  Filled 2021-08-16: qty 5

## 2021-08-16 MED ORDER — PROPOFOL 10 MG/ML IV BOLUS
INTRAVENOUS | Status: DC | PRN
Start: 2021-08-16 — End: 2021-08-16
  Administered 2021-08-16: 20 ug/kg/min via INTRAVENOUS
  Administered 2021-08-16: 150 mg via INTRAVENOUS

## 2021-08-16 MED ORDER — EPHEDRINE 5 MG/ML INJ
INTRAVENOUS | Status: AC
  Filled 2021-08-16: qty 5

## 2021-08-16 MED ORDER — ISOSULFAN BLUE 1 % ~~LOC~~ SOLN
SUBCUTANEOUS | Status: DC | PRN
  Administered 2021-08-16: 5 mL via SUBCUTANEOUS

## 2021-08-16 MED ORDER — CELECOXIB 200 MG PO CAPS
ORAL_CAPSULE | ORAL | Status: AC
  Filled 2021-08-16: qty 1

## 2021-08-16 MED ORDER — GABAPENTIN 300 MG PO CAPS
ORAL_CAPSULE | ORAL | Status: AC
  Filled 2021-08-16: qty 1

## 2021-08-16 MED ORDER — ONDANSETRON HCL 4 MG/2ML IJ SOLN
INTRAMUSCULAR | Status: AC
  Filled 2021-08-16: qty 2

## 2021-08-16 MED ORDER — FENTANYL CITRATE (PF) 100 MCG/2ML IJ SOLN
25.0000 ug | INTRAMUSCULAR | Status: DC | PRN
  Administered 2021-08-16 (×3): 25 ug via INTRAVENOUS

## 2021-08-16 MED ORDER — FAMOTIDINE 20 MG PO TABS
ORAL_TABLET | ORAL | Status: AC
  Filled 2021-08-16: qty 1

## 2021-08-16 MED ORDER — IBUPROFEN 800 MG PO TABS: 800.0000 mg | ORAL_TABLET | Freq: Three times a day (TID) | ORAL | 0 refills | Status: DC | PRN

## 2021-08-16 MED ORDER — BUPIVACAINE LIPOSOME 1.3 % IJ SUSP
INTRAMUSCULAR | Status: AC
  Filled 2021-08-16: qty 20

## 2021-08-16 MED ORDER — STERILE WATER FOR IRRIGATION IR SOLN
Status: DC | PRN
  Administered 2021-08-16: 1000 mL

## 2021-08-16 MED ORDER — DEXAMETHASONE SODIUM PHOSPHATE 10 MG/ML IJ SOLN
INTRAMUSCULAR | Status: DC | PRN
  Administered 2021-08-16: 10 mg via INTRAVENOUS

## 2021-08-16 MED ORDER — ONDANSETRON HCL 4 MG/2ML IJ SOLN: 4.0000 mg | Freq: Once | INTRAMUSCULAR | Status: DC | PRN

## 2021-08-16 MED ORDER — TECHNETIUM TC 99M TILMANOCEPT KIT
1.0000 | PACK | Freq: Once | INTRAVENOUS | Status: AC | PRN
  Administered 2021-08-16: 1.096 via INTRADERMAL

## 2021-08-16 MED ORDER — HYDROCODONE-ACETAMINOPHEN 5-325 MG PO TABS: 1.0000 | ORAL_TABLET | Freq: Once | ORAL | Status: AC

## 2021-08-16 SURGICAL SUPPLY — 43 items
BLADE SURG SZ10 CARB STEEL (BLADE) ×4 IMPLANT
BULB RESERV EVAC DRAIN JP 100C (MISCELLANEOUS) ×2 IMPLANT
CHLORAPREP W/TINT 26 (MISCELLANEOUS) ×2 IMPLANT
CLIP TI MEDIUM 6 (CLIP) ×4 IMPLANT
CLIP VESOCCLUDE MED 6/CT (CLIP) IMPLANT
DRAIN CHANNEL JP 15F RND 16 (MISCELLANEOUS) ×2 IMPLANT
DRAPE LAPAROTOMY TRNSV 106X77 (MISCELLANEOUS) ×2 IMPLANT
DRSG GAUZE FLUFF 36X18 (GAUZE/BANDAGES/DRESSINGS) ×2 IMPLANT
DRSG TELFA 3X8 NADH (GAUZE/BANDAGES/DRESSINGS) ×2 IMPLANT
ELECT CAUTERY BLADE 6.4 (BLADE) ×2 IMPLANT
ELECT CAUTERY BLADE TIP 2.5 (TIP) ×2
ELECT REM PT RETURN 9FT ADLT (ELECTROSURGICAL) ×2
ELECTRODE CAUTERY BLDE TIP 2.5 (TIP) ×1 IMPLANT
ELECTRODE REM PT RTRN 9FT ADLT (ELECTROSURGICAL) ×1 IMPLANT
GAUZE 4X4 16PLY ~~LOC~~+RFID DBL (SPONGE) ×2 IMPLANT
GLOVE SURG ORTHO LTX SZ7.5 (GLOVE) ×4 IMPLANT
GOWN STRL REUS W/ TWL LRG LVL3 (GOWN DISPOSABLE) ×2 IMPLANT
GOWN STRL REUS W/TWL LRG LVL3 (GOWN DISPOSABLE) ×2
HEMOSTAT ARISTA ABSORB 3G PWDR (HEMOSTASIS) IMPLANT
KIT TURNOVER KIT A (KITS) ×2 IMPLANT
MANIFOLD NEPTUNE II (INSTRUMENTS) ×2 IMPLANT
NEEDLE HYPO 22GX1.5 SAFETY (NEEDLE) ×2 IMPLANT
PACK BASIN MAJOR ARMC (MISCELLANEOUS) ×2 IMPLANT
SLEVE PROBE SENORX GAMMA FIND (MISCELLANEOUS) ×2 IMPLANT
SPONGE T-LAP 18X18 ~~LOC~~+RFID (SPONGE) ×6 IMPLANT
STAPLER SKIN PROX 35W (STAPLE) IMPLANT
SUT ETHILON 3-0 FS-10 30 BLK (SUTURE) ×2
SUT MNCRL AB 4-0 PS2 18 (SUTURE) ×2 IMPLANT
SUT SILK 2 0 (SUTURE) ×1
SUT SILK 2 0 SH (SUTURE) ×2 IMPLANT
SUT SILK 2-0 18XBRD TIE 12 (SUTURE) ×1 IMPLANT
SUT V-LOC 90 ABS DVC 3-0 CL (SUTURE) ×4 IMPLANT
SUT VIC AB 3-0 54X BRD REEL (SUTURE) IMPLANT
SUT VIC AB 3-0 BRD 54 (SUTURE)
SUT VIC AB 3-0 SH 27 (SUTURE) ×1
SUT VIC AB 3-0 SH 27X BRD (SUTURE) ×1 IMPLANT
SUTURE EHLN 3-0 FS-10 30 BLK (SUTURE) ×1 IMPLANT
SWABSTK COMLB BENZOIN TINCTURE (MISCELLANEOUS) IMPLANT
SYR 10ML LL (SYRINGE) IMPLANT
SYR 20ML LL LF (SYRINGE) ×2 IMPLANT
SYR BULB IRRIG 60ML STRL (SYRINGE) ×2 IMPLANT
WATER STERILE IRR 1000ML POUR (IV SOLUTION) ×2 IMPLANT
WATER STERILE IRR 500ML POUR (IV SOLUTION) ×2 IMPLANT

## 2021-08-16 NOTE — Discharge Instructions (Signed)

## 2021-08-16 NOTE — Transfer of Care (Signed)
Immediate Anesthesia Transfer of Care Note  Patient: Emily Tapia  Procedure(s) Performed: SIMPLE MASTECTOMY WITH AXILLARY SENTINEL NODE BIOPSY (Left)  Patient Location: PACU  Anesthesia Type:General  Level of Consciousness: sedated  Airway & Oxygen Therapy: Patient Spontanous Breathing and Patient connected to face mask oxygen  Post-op Assessment: Report given to RN and Post -op Vital signs reviewed and stable  Post vital signs: Reviewed and stable  Last Vitals:  Vitals Value Taken Time  BP 98/60 08/16/21 1534  Temp    Pulse 67 08/16/21 1539  Resp 8 08/16/21 1539  SpO2 99 % 08/16/21 1539  Vitals shown include unvalidated device data.  Last Pain:  Vitals:   08/16/21 1102  TempSrc: Temporal  PainSc: 0-No pain         Complications: No notable events documented.

## 2021-08-16 NOTE — Interval H&P Note (Signed)
History and Physical Interval Note:  08/16/2021 12:42 PM  Emily Tapia  has presented today for surgery, with the diagnosis of left breast cancer.  The various methods of treatment have been discussed with the patient and family. After consideration of risks, benefits and other options for treatment, the patient has consented to  Procedure(s): [place] (Left) as a surgical intervention.  The patient's history has been reviewed, patient examined, no change in status, stable for surgery.  I have reviewed the patient's chart and labs.  Questions were answered to the patient's satisfaction.   The left side is marked.   Ronny Bacon

## 2021-08-16 NOTE — Anesthesia Procedure Notes (Signed)
Procedure Name: LMA Insertion Date/Time: 08/16/2021 1:25 PM Performed by: Rona Ravens, CRNA Pre-anesthesia Checklist: Patient identified, Patient being monitored, Timeout performed, Emergency Drugs available and Suction available Patient Re-evaluated:Patient Re-evaluated prior to induction Oxygen Delivery Method: Circle system utilized Preoxygenation: Pre-oxygenation with 100% oxygen Induction Type: IV induction Ventilation: Mask ventilation without difficulty LMA: LMA inserted LMA Size: 3.5 Tube type: Oral Number of attempts: 1 Placement Confirmation: positive ETCO2 and breath sounds checked- equal and bilateral Tube secured with: Tape Dental Injury: Teeth and Oropharynx as per pre-operative assessment

## 2021-08-16 NOTE — Op Note (Signed)
Procedure Date:  08/16/2021  Pre-operative Diagnosis: Left breast cancer  Post-operative Diagnosis: Same  Procedure: Left simple mastectomy and sentinel lymph node biopsy  Surgeon:  Ronny Bacon, M.D., [facility]  Anesthesia:  General endotracheal  Estimated Blood Loss: 30 ml  Specimens: Left breast, 3 sentinel lymph nodes.  Complications: None  Indications for Procedure:  This is a 60 y.o. female who presents with a palpable mass in the left breast.  Percutaneous biopsies confirming invasive cancer, left axillary ultrasound 1 month ago revealed no lymphadenopathy.  The risks of bleeding, infection, injury to surrounding structures, hematoma, seroma, open wound, cosmetic deformity, and the need for further surgery were all discussed with the patient and was willing to proceed.  Prior to this procedure, the patient had undergone sentinel lymphoscintigraphy.  Description of Procedure: The patient was correctly identified in the preoperative area and brought into the operating room.  The patient was placed supine with VTE prophylaxis in place.  Appropriate time-outs were performed.  Anesthesia was induced and the patient was intubated.  Appropriate antibiotics were infused.  5 mL of a visual dye of methylene blue was injected in the left periareolar region under aseptic conditions, massage administered for 5 minutes. The left chest and axilla were prepped and draped in usual sterile fashion.   An elliptical incision was then completed over the breast encompassing the nipple-areolar complex, with attention to the lesion of which we are concerned.  Using electrosurgery, subcutaneous flaps were created superiorly to the clavicle, inferiorly to the inframammary fold, medially to the sternum, and laterally to the latissimus dorsi with careful attention to create flaps of adequate thickness.  The breast tissue was then dissected with the pectoralis fascia as the deep margin.  2-0 silk suture was used to  mark the specimen as short superior and long lateral.  The cavity was then irrigated and hemostasis was assured with electrocautery.   Then using the hand-held probe an area of high counts was identified in the axilla, and cautery was used to dissect down through the subcutaneous tissue and the hand-held probe was used to guide dissection. A blue and "hot" lymph node was identified and resected.  This had a count of 7000.  Additional lymph nodes were identified and resected with counts of 5000 & 1600.  The wound bed had a count of less than 100.  The cavity was irrigated and hemostasis was assured with electrocautery.  Local anesthetic was infiltrated into the skin and subcutaneous tissue of the cavity.   76 Fr Blake drains were placed via lateral stab incision to drain the mastectomy wound bed.  The incision was then closed in two layers with 3-0 V-Loc and staples.  The wound was dressed with fluff gauze and a surgical bra was placed.  The patient was emerged from anesthesia and extubated and brought to the recovery room for further management.  The patient tolerated the procedure well and all counts were correct at the end of the case.  Sentinel Node Biopsy Synoptic Operative Report  Operation performed with curative intent:Yes  Tracer(s) used to identify sentinel nodes in the upfront surgery (non-neoadjuvant) setting (select all that apply):Dye and Radioactive Tracer  Tracer(s) used to identify sentinel nodes in the neoadjuvant setting (select all that apply):N/A  All nodes (colored or non-colored) present at the end of a dye-filled lymphatic channel were removed:Yes   All significantly radioactive nodes were removed:Yes  All palpable suspicious nodes were removed:Yes  Biopsy-proven positive nodes marked with clips prior to chemotherapy  were identified and removed:N/A    Ronny Bacon M.D., [facility] 08/16/2021, 3:33 PM

## 2021-08-16 NOTE — Anesthesia Preprocedure Evaluation (Signed)
Anesthesia Evaluation  Patient identified by MRN, date of birth, ID band Patient awake    Reviewed: Allergy & Precautions, H&P , NPO status , Patient's Chart, lab work & pertinent test results, reviewed documented beta blocker date and time   Airway Mallampati: II  TM Distance: >3 FB Neck ROM: full    Dental  (+) Teeth Intact   Pulmonary sleep apnea , former smoker,    Pulmonary exam normal        Cardiovascular Exercise Tolerance: Good hypertension, On Medications Normal cardiovascular exam+ Valvular Problems/Murmurs  Rate:Normal     Neuro/Psych PSYCHIATRIC DISORDERS Anxiety  Neuromuscular disease    GI/Hepatic negative GI ROS, Neg liver ROS,   Endo/Other  diabetesHypothyroidism   Renal/GU negative Renal ROS  negative genitourinary   Musculoskeletal   Abdominal   Peds  Hematology  (+) Blood dyscrasia, anemia ,   Anesthesia Other Findings   Reproductive/Obstetrics negative OB ROS                             Anesthesia Physical Anesthesia Plan  ASA: 3  Anesthesia Plan: General LMA   Post-op Pain Management:    Induction:   PONV Risk Score and Plan: 4 or greater  Airway Management Planned:   Additional Equipment:   Intra-op Plan:   Post-operative Plan:   Informed Consent: I have reviewed the patients History and Physical, chart, labs and discussed the procedure including the risks, benefits and alternatives for the proposed anesthesia with the patient or authorized representative who has indicated his/her understanding and acceptance.       Plan Discussed with: CRNA  Anesthesia Plan Comments:         Anesthesia Quick Evaluation

## 2021-08-17 ENCOUNTER — Encounter: Payer: Self-pay | Admitting: Surgery

## 2021-08-17 NOTE — Anesthesia Postprocedure Evaluation (Signed)
Anesthesia Post Note  Patient: Emily Tapia  Procedure(s) Performed: SIMPLE MASTECTOMY WITH AXILLARY SENTINEL NODE BIOPSY (Left: Breast)  Patient location during evaluation: PACU Anesthesia Type: General Level of consciousness: awake and alert Pain management: pain level controlled Vital Signs Assessment: post-procedure vital signs reviewed and stable Respiratory status: spontaneous breathing, nonlabored ventilation, respiratory function stable and patient connected to nasal cannula oxygen Cardiovascular status: blood pressure returned to baseline and stable Postop Assessment: no apparent nausea or vomiting Anesthetic complications: no   No notable events documented.   Last Vitals:  Vitals:   08/16/21 1630 08/16/21 1644  BP: 109/61 (!) 97/55  Pulse: 83 87  Resp: 13 14  Temp:  37.2 C  SpO2: 95% 94%    Last Pain:  Vitals:   08/16/21 1644  TempSrc: Temporal  PainSc: [name]

## 2021-08-22 ENCOUNTER — Telehealth: Payer: Self-pay | Admitting: *Deleted

## 2021-08-22 ENCOUNTER — Other Ambulatory Visit: Payer: Self-pay

## 2021-08-22 ENCOUNTER — Encounter: Payer: Self-pay | Admitting: Surgery

## 2021-08-22 ENCOUNTER — Ambulatory Visit (INDEPENDENT_AMBULATORY_CARE_PROVIDER_SITE_OTHER): Payer: 59 | Admitting: Surgery

## 2021-08-22 VITALS — BP 138/81 | HR 69 | Temp 98.4°F | Ht 64.0 in | Wt 188.4 lb

## 2021-08-22 DIAGNOSIS — Z9012 Acquired absence of left breast and nipple: Secondary | ICD-10-CM | POA: Insufficient documentation

## 2021-08-22 NOTE — Telephone Encounter (Signed)
Faxed Objective medical findings to [facility] at [phone]

## 2021-08-22 NOTE — Patient Instructions (Addendum)
[facility]  [address], [place], [place] [address]  [phone]  If you have any concerns or questions, please feel free to call our office. See follow up appointment below.   Total or Modified Radical Mastectomy, Care After This sheet gives you information about how to care for yourself after your procedure. Your health care provider may also give you more specific instructions. If you have problems or questions, contact your health care provider. What can I expect after the procedure? After the procedure, it is common to have: Pain. Numbness. Stiffness in the arm or shoulder. Feelings of stress, sadness, or depression. If the lymph nodes under your arm were removed, you may have arm swelling, weakness, or numbness on the same side of your body as your surgery. Follow these instructions at home: Incision care  Follow instructions from your health care provider about how to take care of your incision. Make sure you: Wash your hands with soap and water before you change your bandage (dressing). If soap and water are not available, use hand sanitizer. Change your dressing as told by your health care provider. Leave stitches (sutures), skin glue, or adhesive strips in place. These skin closures may need to stay in place for 2 weeks or longer. If adhesive strip edges start to loosen and curl up, you may trim the loose edges. Do not remove adhesive strips completely unless your health care provider tells you to do that. Check your incision area every day for signs of infection. Check for: Redness, swelling, or more pain. Fluid or blood. Warmth. Pus or a bad smell. If you were sent home with a surgical drain in place, follow instructions from your health care provider about emptying it. Bathing Do not take baths, swim, or use a hot tub until your health care provider approves. Ask your health care provider if you may take showers. You may only be allowed to take sponge  baths. Activity  Return to your normal activities as told by your health care provider. Ask your health care provider what activities are safe for you. Avoid activities that take a lot of effort. Be careful to avoid any activities that could cause an injury to your arm on the side of your surgery. Do not lift anything that is heavier than 10 lb (4.5 kg), or the limit that you are told, until your health care provider says that it is safe. Avoid lifting with the arm on the side of your surgery. Do not carry heavy objects on your shoulder. After your drain is removed, do exercises to prevent stiffness and swelling in your arm. Talk with your health care provider about which exercises are safe for you. General instructions Take over-the-counter and prescription medicines only as told by your health care provider. You may eat what you usually do. Keep your arm raised (elevated) above the level of your heart when you are sitting or lying down. Do not wear tight jewelry on your arm, wrist, or fingers on the side of your surgery. You may be given a tight sleeve (compression bandage) to wear over your arm on the side of your surgery. Wear this sleeve as told by your health care provider. Ask your health care provider when you can start wearing a bra or using a breast prosthesis. Before you are involved in certain procedures such as giving blood or having your blood pressure checked, tell all your health care providers if lymph nodes under your arm were removed. This is  important information. Follow-up Keep all follow-up visits as told by your health care provider. This is important. Get checked for extra fluid around your lymph nodes (lymphedema) as often as told by your health care provider. Contact a health care provider if: You have a fever. Your pain medicine is not working. Your arm swelling, weakness, or numbness has not improved after a few weeks. You have new swelling in your breast area or  arm. You have redness, swelling, or more pain in your incision area. You have fluid or blood coming from your incision. Your incision feels warm to the touch. You have pus or a bad smell coming from your incision. Get help right away if: You have very bad pain in your breast area or arm. You have chest pain. You have difficulty breathing. Summary Follow instructions from your health care provider about how to take care of your incision. Check your incision area every day for signs of infection. Ask your health care provider what activities are safe for you. Keep all follow-up visits as told by your health care provider. This is important. Make sure you know which symptoms should cause you to contact your health care provider or to get help right away. This information is not intended to replace advice given to you by your health care provider. Make sure you discuss any questions you have with your health care provider. Document Revised: 06/24/2020 Document Reviewed: 06/24/2020 Elsevier Patient Education  [place].

## 2021-08-22 NOTE — Progress Notes (Signed)
[facility] POST-OP OFFICE VISIT  08/22/2021  HPI: Emily Tapia is a 60 y.o. female 6 days s/p left total mastectomy with sentinel lymph node biopsy.  She reports she has had the expected soreness.  JP draining serosanguineous fluid, maintaining bulb function without issue.  Denies nausea, vomiting, fevers and chills.  Reports some numbness under the left arm.  Vital signs: BP 138/81   Pulse 69   Temp 98.4 F (36.9 C) (Oral)   Ht '5\' 4"'$  (1.626 m)   Wt 188 lb 6.4 oz (85.5 kg)   SpO2 94%   BMI 32.34 kg/m    Physical Exam: Constitutional: She appears well. Chest wall notable for some inferior flap ecchymotic changes of the medial aspect, still clearly viable. Skin: Staples removed and replaced with Steri-Strips over benzoin. I milked the drain and obtained better flow.  There is no evidence of residual seroma or hematoma.  Due to the volume of output we will continue the JP drain for another week.  Assessment/Plan: This is a 60 y.o. female 6 days s/p left mastectomy with sentinel lymph node biopsy.  Most significant finding on the pathology report is the presence of metastases in 2 of the 4 lymph nodes taken.  Briefly discussed with patient this may increase her need for chemotherapy.  Hopefully we would agree that this does not not warrant axillary lymph node dissection but that will be up for further discussion.  We will anticipate representation in the next breast conference.  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   Pelvic mass 07/08/2015   Hyperlipidemia, mixed 07/08/2015   Hx of iron deficiency 07/08/2015   Absolute anemia 04/27/2015   Diabetic peripheral neuropathy associated with type 2 diabetes mellitus ([place]) 04/27/2015   Breath shortness 04/27/2015   Accumulation of fluid in tissues 04/27/2015   Cardiac murmur 04/27/2015   HLD (hyperlipidemia) 04/27/2015   Decreased  potassium in the blood 04/27/2015   Hypothyroidism 04/27/2015   Obstructive sleep apnea 04/27/2015   Disorder of peripheral nervous system 04/27/2015   Obstructive apnea 04/27/2015   Pain in shoulder 04/27/2015   Diabetes mellitus, type 2 ([place]) 04/27/2015   Fibroid uterus 10/25/2013   Aortic valve stenosis 05/01/2011   Edema 03/27/2011   SOB (shortness of breath) 03/27/2011   Diabetes mellitus ([place]) 03/27/2011   HTN (hypertension) 03/27/2011   Essential (primary) hypertension 07/05/2006   Anxiety, generalized 07/05/2006    -We will have her follow-up next week hopefully for drain removal.  Wrote prescription for postmastectomy bra gave instructions.  She would continue to avoid showering at this time.   Ronny Bacon M.D., FACS 08/22/2021, 10:22 AM

## 2021-08-25 ENCOUNTER — Other Ambulatory Visit: Payer: Self-pay | Admitting: Pathology

## 2021-08-26 NOTE — Progress Notes (Signed)
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    INTERVAL HISTORY: Patient returns to clinic today for further evaluation, discussion of her pathology results, and treatment planning.  She underwent simple mastectomy on August 16, 2021 which not only revealed invasive carcinoma, the patient also had 2 of 3 lymph nodes positive for disease.  She currently feels well and is recovering from her surgery.  She has no neurologic complaints.  She denies any recent fevers or illnesses.  She has a good appetite and denies weight loss.  She has no chest pain, shortness of breath, cough, or hemoptysis.  She denies any nausea, vomiting, constipation, or diarrhea.  She has no urinary complaints.  Patient offers no further specific complaints today.  REVIEW OF SYSTEMS:   Review of Systems  Constitutional: Negative.  Negative for fever, malaise/fatigue and weight loss.  Respiratory: Negative.  Negative for cough and shortness of breath.   Cardiovascular: Negative.  Negative for chest pain and leg swelling.  Gastrointestinal: Negative.  Negative for abdominal pain.  Genitourinary: Negative.  Negative for dysuria.  Musculoskeletal: Negative.  Negative for back pain.  Skin: Negative.  Negative for rash.  Neurological: Negative.  Negative for dizziness, focal weakness, weakness and headaches.  Psychiatric/Behavioral: Negative.  The patient is not nervous/anxious.    As per HPI. Otherwise, a complete review of systems is negative.  PAST MEDICAL HISTORY: Past Medical History:  Diagnosis Date   Anxiety    Aortic stenosis    Diabetes mellitus    Edema    Heart murmur     Hypertension    Sleep apnea    Thyroid disease     PAST SURGICAL HISTORY: Past Surgical History:  Procedure Laterality Date   ABDOMINAL HYSTERECTOMY  11/17/2013   BREAST BIOPSY     x2   BREAST BIOPSY Left 07/20/2021   [place] Bx, Ribbon Clip, Path pending   BREAST BIOPSY Left 07/20/2021   Stereo Bx, X-clip, path pending   BREAST BIOPSY Left 07/20/2021   Stereo biopsy, coil clip, path pending   BREAST BIOPSY Right 07/20/2021   Stereo Bx, Ribbon Clip, path pending   CESAREAN SECTION     x 2   CHOLECYSTECTOMY  12/17/2001   DUE TO STONES   DILATION AND CURETTAGE OF UTERUS     ELBOW SURGERY     POLYPECTOMY  12/17/1993   SIMPLE MASTECTOMY WITH AXILLARY SENTINEL NODE BIOPSY Left 08/16/2021   Procedure: SIMPLE MASTECTOMY WITH AXILLARY SENTINEL NODE BIOPSY;  Surgeon: Ronny Bacon, MD;  Location: [facility] ORS;  Service: General;  Laterality: Left;    FAMILY HISTORY: Family History  Problem Relation Age of Onset   Hyperlipidemia Mother    Thyroid disease Mother    Hashimoto's thyroiditis Mother    Heart disease Father        open heart surgery   Heart failure Father    Diabetes Father    Dementia Father    Thyroid disease Brother    Breast cancer Maternal Grandmother    Diabetes Paternal Grandmother    Prostate cancer Paternal Grandfather     ADVANCED DIRECTIVES (Y/N):  N  HEALTH MAINTENANCE: Social History   Tobacco Use   Smoking status: Former  Packs/day: 1.50    Years: 25.00    Pack years: 37.50    Types: Cigarettes    Quit date: 11/24/2001    Years since quitting: 19.7   Smokeless tobacco: Never  Vaping Use   Vaping Use: Never used  Substance Use Topics   Alcohol use: Yes    Alcohol/week: 0.0 standard drinks    Comment: OCCASIONALLY DRINKS WINE, ONCE A WEEK   Drug use: No     Colonoscopy:  PAP:  Bone density:  Lipid panel:  No Known Allergies  Current Outpatient Medications  Medication Sig Dispense Refill   Ascorbic Acid (VITAMIN C PO) Take 1  tablet by mouth daily.     aspirin 81 MG EC tablet Take 81 mg by mouth at bedtime.     Cholecalciferol (VITAMIN D3) 1000 UNITS CAPS Take 1,000 Units by mouth in the morning.     Dulaglutide (TRULICITY) 3 YC/1.4GY SOPN Inject 3 mg as directed once a week. 2 mL 3   EUTHYROX 75 MCG tablet TAKE 1 TABLET BY MOUTH ONCE DAILY BEFORE BREAKFAST .OFFICE  VIST  NEEDED  BEFORE  NEXT  REFILL  IS  DUE 90 tablet 3   Ferrous Sulfate (IRON) 325 (65 Fe) MG TABS Take 1 tablet by mouth once daily with breakfast 90 tablet 0   furosemide (LASIX) 40 MG tablet TAKE 1 TABLET BY MOUTH ONCE DAILY AS NEEDED FOR FLUID OR EDEMA 90 tablet 1   glucose blood (CONTOUR NEXT TEST) test strip Test fasting sugar each morning. Recheck if having hypoglycemic symptoms. 100 each 4   glyBURIDE (DIABETA) 5 MG tablet TAKE 1 TABLET BY MOUTH TWICE DAILY WITH MEALS. 90 tablet 3   hydrochlorothiazide (HYDRODIURIL) 25 MG tablet Take 1 tablet (25 mg total) by mouth daily. 90 tablet 1   lisinopril (ZESTRIL) 40 MG tablet Take 1 tablet (40 mg total) by mouth daily. 90 tablet 1   metFORMIN (GLUCOPHAGE) 1000 MG tablet Take 1 tablet (1,000 mg total) by mouth 2 (two) times daily with a meal. 180 tablet 3   Multiple Vitamin (MULTIVITAMIN) tablet Take 1 tablet by mouth every morning.     Multiple Vitamins-Minerals (ZINC PO) Take 1 tablet by mouth daily.     PARoxetine (PAXIL) 20 MG tablet Take 1 tablet (20 mg total) by mouth daily. 30 tablet 2   carvedilol (COREG) 25 MG tablet Take 1 tablet (25 mg total) by mouth 2 (two) times daily. 180 tablet 3   diclofenac (VOLTAREN) 50 MG EC tablet Take 1 tablet (50 mg total) by mouth 2 (two) times daily. (Patient not taking: Reported on 08/30/2021) 60 tablet 0   No current facility-administered medications for this visit.    OBJECTIVE: Vitals:   08/30/21 1056  BP: (!) 150/75  Pulse: 68  Resp: 16  Temp: 97.9 F (36.6 C)  SpO2: 97%     Body mass index is 32.1 kg/m.    ECOG FS:0 - Asymptomatic  General:  Well-developed, well-nourished, no acute distress. Eyes: Pink conjunctiva, anicteric sclera. HEENT: Normocephalic, moist mucous membranes. Breast: Left mastectomy.  Exam deferred today. Lungs: No audible wheezing or coughing. Heart: Regular rate and rhythm. Abdomen: Soft, nontender, no obvious distention. Musculoskeletal: No edema, cyanosis, or clubbing. Neuro: Alert, answering all questions appropriately. Cranial nerves grossly intact. Skin: No rashes or petechiae noted. Psych: Normal affect.    LAB RESULTS:  Lab Results  Component Value Date   NA 138 08/08/2021   K 3.7 08/08/2021   CL 99 08/08/2021  CO2 30 08/08/2021   GLUCOSE 110 (H) 08/08/2021   BUN 18 08/08/2021   CREATININE 0.76 08/08/2021   CALCIUM 9.5 08/08/2021   PROT 7.0 08/08/2021   ALBUMIN 4.0 08/08/2021   AST 36 08/08/2021   ALT 37 08/08/2021   ALKPHOS 55 08/08/2021   BILITOT 0.8 08/08/2021   GFRNONAA >60 08/08/2021   GFRAA 111 10/14/2020    Lab Results  Component Value Date   WBC 6.2 08/08/2021   NEUTROABS 3.5 08/08/2021   HGB 12.4 08/08/2021   HCT 38.3 08/08/2021   MCV 82.2 08/08/2021   PLT 133 (L) 08/08/2021     STUDIES: DG Wrist Complete Right  Result Date: 08/05/2021 CLINICAL DATA:  RIGHT wrist pain with swelling.  Question infection. EXAM: RIGHT WRIST - COMPLETE 3+ VIEW COMPARISON:  None FINDINGS: No acute fracture or dislocation. Mild osteopenia. Question of mild soft tissue swelling about the area of the ulna. No bony destruction. IMPRESSION: 1. No acute bony abnormality. 2. Question of mild soft tissue swelling about the area of the ulna. Correlate with site of pain. Electronically Signed   By: Zetta Bills M.D.   On: 08/05/2021 10:25   NM Sentinel Node Inj-No Rpt (Breast)  Result Date: 08/16/2021 Sulfur Colloid was injected by the Nuclear Medicine Technologist for sentinel lymph node localization.    ASSESSMENT: Stage IIa ER/PR positive, HER2 negative multifocal carcinoma of the left  breast.    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  Previously, right breast biopsy was negative for malignancy.  Patient will likely require adjuvant chemotherapy given the stage of her disease.  Initial plan was to send Oncotype score to assess risk, but patient's insurance will not cover and she may be required to pay for this out-of-pocket.  Given the fact that chemotherapy is likely necessary, will further discuss with patient prior to sending.  She will not require adjuvant XRT given the fact that she had a simple mastectomy.  Return to clinic in 2 weeks for further evaluation and treatment planning.    I spent a total of 30 minutes reviewing chart data, face-to-face evaluation with the patient, counseling and coordination of care as detailed above.  Patient expressed understanding and was in agreement with this plan. She also understands that She can call clinic at any time with any questions, concerns, or complaints.   Cancer Staging Carcinoma of left breast [facility]) Staging form: Breast, AJCC 8th Edition - Pathologic stage from 08/30/2021: Stage IIA (pT2, pN1a, cM0, G3, ER+, PR+, HER2-) - Signed by Lloyd Huger, MD on 08/30/2021 Stage prefix: Initial diagnosis Histologic grading system: 3 grade system  Lloyd Huger, MD   08/30/2021 11:19 AM

## 2021-08-29 ENCOUNTER — Encounter: Payer: Self-pay | Admitting: Surgery

## 2021-08-29 ENCOUNTER — Other Ambulatory Visit: Payer: Self-pay

## 2021-08-29 ENCOUNTER — Ambulatory Visit (INDEPENDENT_AMBULATORY_CARE_PROVIDER_SITE_OTHER): Payer: 59 | Admitting: Surgery

## 2021-08-29 VITALS — BP 124/71 | HR 75 | Temp 98.2°F | Ht 64.0 in | Wt 187.0 lb

## 2021-08-29 DIAGNOSIS — Z9012 Acquired absence of left breast and nipple: Secondary | ICD-10-CM

## 2021-08-29 NOTE — Progress Notes (Signed)
[facility] POST-OP OFFICE VISIT  08/29/2021  HPI: Emily Tapia is a 60 y.o. female 2 weeks s/p left total mastectomy with sentinel lymph node biopsy.  She has no complaints or new issues.  She reports she is has diminished pain, has good range of motion.  Review of her drain sheet shows that her output is diminishing gradually yesterday she did 80 MLS over 24-hour.  She sees her oncologist tomorrow.  Vital signs: BP 124/71   Pulse 75   Temp 98.2 F (36.8 C)   Ht '5\' 4"'$  (1.626 m)   Wt 187 lb (84.8 kg)   SpO2 93%   BMI 32.10 kg/m    Physical Exam: Constitutional: She appears well.  Skin: Skin flaps appear stable.  There is no evidence of seroma or hematoma present.  JP drain site appears without remarkable inflammation or irritation present.  I milked the drain and it is far more serous than it was before.  I anticipate another week can enable [place] to pull the drain.  Assessment/Plan: This is a 60 y.o. female 2 weeks s/p left total mastectomy with sentinel lymph node biopsy.  She has 2 positive nodes out of the 4 nodes sampled.  I am anticipating a role for chemotherapy as well as possible radiation to the axilla.  We briefly discussed the possibility of needing a Port-A-Cath placed in the near future.  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   Pelvic mass 07/08/2015   Hyperlipidemia, mixed 07/08/2015   Hx of iron deficiency 07/08/2015   Absolute anemia 04/27/2015   Diabetic peripheral neuropathy associated with type 2 diabetes mellitus ([place]) 04/27/2015   Breath shortness 04/27/2015   Accumulation of fluid in tissues 04/27/2015   Cardiac murmur 04/27/2015   HLD (hyperlipidemia) 04/27/2015   Decreased potassium in the blood 04/27/2015   Hypothyroidism 04/27/2015   Obstructive sleep apnea 04/27/2015   Disorder of peripheral nervous system  04/27/2015   Obstructive apnea 04/27/2015   Pain in shoulder 04/27/2015   Diabetes mellitus, type 2 ([place]) 04/27/2015   Fibroid uterus 10/25/2013   Aortic valve stenosis 05/01/2011   Edema 03/27/2011   SOB (shortness of breath) 03/27/2011   Diabetes mellitus ([place]) 03/27/2011   HTN (hypertension) 03/27/2011   Essential (primary) hypertension 07/05/2006   Anxiety, generalized 07/05/2006    -Follow-up next week,    Ronny Bacon M.D., [facility] 08/29/2021, 10:43 AM

## 2021-08-29 NOTE — Patient Instructions (Addendum)
Continue to strip your drain after you empty it.   Follow up here in 1 week. Be sure to bring your drain sheet.   Mastectomy, Care After This sheet gives you information about how to care for yourself after your procedure. Your health care provider may also give you more specific instructions. If you have problems or questions, contact your health care provider. What can I expect after the procedure? After the procedure, it is common to have: Breast swelling. Breast tenderness. Stiffness in your arm or shoulder. A change in breast shape. A change in how the breast feels. Pain. Numbness or tingling. Follow these instructions at home: Incision care  Avoid wearing a bra following your procedure to allow the incision to heal. Your health care provider will tell you when it is safe to wear a bra. Follow instructions from your health care provider about how to take care of your incision. Make sure you: Wash your hands with soap and water for at least 20 seconds before and after you change your bandage (dressing). If soap and water are not available, use hand sanitizer. Change your dressing as told by your health care provider. Keep your dressing dry. Leave stitches (sutures), skin glue, or adhesive strips in place. These skin closures may need to stay in place for 2 weeks or longer. If adhesive strip edges start to loosen and curl up, you may trim the loose edges. Do not remove adhesive strips completely unless your health care provider tells you to do that. Check your incision area and drain area every day for signs of infection. Check for: More redness, swelling, or pain. Fluid or blood. Warmth. Pus or a bad smell. Medicines Take over-the-counter and prescription medicines only as told by your health care provider. Ask your health care provider if the medicine prescribed to you: Requires you to avoid driving or using machinery. Can cause constipation. You may need to take these actions  to prevent or treat constipation: Drink enough fluid to keep your urine pale yellow. Take over-the-counter or prescription medicines. Eat foods that are high in fiber, such as beans, whole grains, and fresh fruits and vegetables. Limit foods that are high in fat and processed sugars, such as fried or sweet foods. Activity  Return to your normal activities as told by your health care provider. Ask your health care provider what activities are safe for you. Avoid exercise that requires a lot of effort (is strenuous). Be careful to avoid any activities that could cause an injury to the arm that is on the side of your surgery. Do not lift anything that is heavier than 10 lb (4.5 kg). Avoid lifting with the arm that is on the side of your surgery. Ask for help with child care, meal preparation, laundry, and shopping, if you are responsible for these things. General instructions  Do not take baths, swim, or use a hot tub until your health care provider approves. Ask your health care provider if you may take showers. You may only be allowed to take sponge baths. Do not drive until your health care provider approves. If you have a drain, empty the fluid from the removable drain bulb as directed by your health care provider. Keep all follow-up visits as told by your health care provider. This is important. Contact a health care provider if: You cannot eat or drink without vomiting. You have more redness, swelling, or pain around your incision or drain. You have fluid or blood coming from your incision  or drain. Your incision or drain area feels warm to the touch. You have pus or a bad smell coming from your incision or drain. You develop a fever. Get help right away if you have: New or sudden chest pain. Chest pain or trouble breathing that gets worse. These symptoms may represent a serious problem that is an emergency. Do not wait to see if the symptoms will go away. Get medical help right away.  Call your local emergency services (911 in the [place] Do not drive yourself to the hospital. Summary Check your incision area and drain area every day for signs of infection, such as redness, swelling, pain, fluid, blood, warmth, pus, or a bad smell. Contact your health care provider if you have a fever. Ask your health care provider if the medicine prescribed to you requires you to avoid driving or using machinery. Keep all follow-up visits as told by your health care provider. This is important. This information is not intended to replace advice given to you by your health care provider. Make sure you discuss any questions you have with your health care provider. Document Revised: 09/25/2019 Document Reviewed: 09/25/2019 Elsevier Patient Education  [place].

## 2021-08-30 ENCOUNTER — Inpatient Hospital Stay: Payer: 59 | Attending: Oncology | Admitting: Oncology

## 2021-08-30 ENCOUNTER — Telehealth: Payer: Self-pay | Admitting: Emergency Medicine

## 2021-08-30 VITALS — BP 150/75 | HR 68 | Temp 97.9°F | Resp 16 | Wt 187.0 lb

## 2021-08-30 DIAGNOSIS — C50812 Malignant neoplasm of overlapping sites of left female breast: Secondary | ICD-10-CM | POA: Diagnosis present

## 2021-08-30 DIAGNOSIS — C50912 Malignant neoplasm of unspecified site of left female breast: Secondary | ICD-10-CM | POA: Insufficient documentation

## 2021-08-30 DIAGNOSIS — D0512 Intraductal carcinoma in situ of left breast: Secondary | ICD-10-CM | POA: Diagnosis not present

## 2021-08-30 DIAGNOSIS — Z818 Family history of other mental and behavioral disorders: Secondary | ICD-10-CM | POA: Insufficient documentation

## 2021-08-30 DIAGNOSIS — I10 Essential (primary) hypertension: Secondary | ICD-10-CM | POA: Insufficient documentation

## 2021-08-30 DIAGNOSIS — Z87891 Personal history of nicotine dependence: Secondary | ICD-10-CM | POA: Insufficient documentation

## 2021-08-30 DIAGNOSIS — M8580 Other specified disorders of bone density and structure, unspecified site: Secondary | ICD-10-CM | POA: Insufficient documentation

## 2021-08-30 DIAGNOSIS — Z9049 Acquired absence of other specified parts of digestive tract: Secondary | ICD-10-CM | POA: Insufficient documentation

## 2021-08-30 DIAGNOSIS — Z8349 Family history of other endocrine, nutritional and metabolic diseases: Secondary | ICD-10-CM | POA: Diagnosis not present

## 2021-08-30 DIAGNOSIS — Z79899 Other long term (current) drug therapy: Secondary | ICD-10-CM | POA: Insufficient documentation

## 2021-08-30 DIAGNOSIS — M25531 Pain in right wrist: Secondary | ICD-10-CM | POA: Insufficient documentation

## 2021-08-30 DIAGNOSIS — Z8042 Family history of malignant neoplasm of prostate: Secondary | ICD-10-CM | POA: Insufficient documentation

## 2021-08-30 DIAGNOSIS — F419 Anxiety disorder, unspecified: Secondary | ICD-10-CM | POA: Diagnosis not present

## 2021-08-30 DIAGNOSIS — C779 Secondary and unspecified malignant neoplasm of lymph node, unspecified: Secondary | ICD-10-CM | POA: Insufficient documentation

## 2021-08-30 DIAGNOSIS — E119 Type 2 diabetes mellitus without complications: Secondary | ICD-10-CM | POA: Insufficient documentation

## 2021-08-30 DIAGNOSIS — Z803 Family history of malignant neoplasm of breast: Secondary | ICD-10-CM | POA: Diagnosis not present

## 2021-08-30 DIAGNOSIS — Z8249 Family history of ischemic heart disease and other diseases of the circulatory system: Secondary | ICD-10-CM | POA: Diagnosis not present

## 2021-08-30 DIAGNOSIS — Z833 Family history of diabetes mellitus: Secondary | ICD-10-CM | POA: Diagnosis not present

## 2021-08-30 DIAGNOSIS — Z170 Estrogen receptor positive status [ER+]: Secondary | ICD-10-CM | POA: Diagnosis not present

## 2021-08-30 NOTE — Telephone Encounter (Signed)
Oncotype submitted online. Order # [id number]

## 2021-08-30 NOTE — Progress Notes (Signed)
No new concerns/problems at this time

## 2021-08-31 ENCOUNTER — Encounter: Payer: Self-pay | Admitting: Oncology

## 2021-08-31 LAB — SURGICAL PATHOLOGY

## 2021-08-31 NOTE — Telephone Encounter (Signed)
Oncotype needing prior authorization (request scanned in media tab).  I called to speak with an [employer] representative at [phone].  The [employer] representative stated that the [facility] lab is out of network on patient's insurance and patient does not have out of network benefits.  I could proceed with PA but if approved it will be filled on insurance as out of network.  I called [facility] [employer] [phone] to obtain an estimate on cost of Oncyotype testing.  The [employer] representative stated that if the PA is denied there is an appeal process and they do have financial assistance available if needed.  Will proceed with submitting PA online to see what next step should be taken for patient to have Oncotype testing.

## 2021-09-04 NOTE — Telephone Encounter (Signed)
PA submitted via [employer] website (order ID: [id number])

## 2021-09-06 ENCOUNTER — Other Ambulatory Visit: Payer: Self-pay

## 2021-09-06 ENCOUNTER — Ambulatory Visit (INDEPENDENT_AMBULATORY_CARE_PROVIDER_SITE_OTHER): Payer: 59 | Admitting: Physician Assistant

## 2021-09-06 ENCOUNTER — Encounter: Payer: Self-pay | Admitting: Physician Assistant

## 2021-09-06 ENCOUNTER — Inpatient Hospital Stay: Payer: 59 | Admitting: *Deleted

## 2021-09-06 VITALS — BP 147/76 | HR 67 | Temp 98.3°F | Ht 64.0 in | Wt 187.8 lb

## 2021-09-06 DIAGNOSIS — Z09 Encounter for follow-up examination after completed treatment for conditions other than malignant neoplasm: Secondary | ICD-10-CM

## 2021-09-06 DIAGNOSIS — C50919 Malignant neoplasm of unspecified site of unspecified female breast: Secondary | ICD-10-CM

## 2021-09-06 DIAGNOSIS — C50412 Malignant neoplasm of upper-outer quadrant of left female breast: Secondary | ICD-10-CM

## 2021-09-06 DIAGNOSIS — Z9012 Acquired absence of left breast and nipple: Secondary | ICD-10-CM

## 2021-09-06 MED ORDER — SILVER SULFADIAZINE 1 % EX CREA: TOPICAL_CREAM | CUTANEOUS | 0 refills | Status: DC

## 2021-09-06 NOTE — Telephone Encounter (Signed)
PA has been denied.  Called [employer] to inform them of denial also faxed the denial letter to [phone].  Will follow up to [place] later to find out if possible patient assistance available for patient.

## 2021-09-06 NOTE — Progress Notes (Signed)
Multidisciplinary Oncology Council Documentation  Emily Tapia was presented by our [facility] on 09/06/2021, which included representatives from:  Palliative Care Dietitian  Physical/Occupational Therapist Nurse Navigator Genetics Speech Therapist Survivorship RN Research RN   Emily Tapia currently presents with history of breast cancer.   We reviewed previous medical and familial history, history of present illness, and recent lab results along with all available histopathologic and imaging studies. The [place] considered available treatment options and made the following recommendations/referrals:  -OT screen with Gwenette Greet  The [facility] is a meeting of clinicians from various specialty areas who evaluate and discuss patients for whom a multidisciplinary approach is being considered. Final determinations in the plan of care are those of the provider(s).   Today's extended care, comprehensive team conference, Emily Tapia was not present for the discussion and was not examined.

## 2021-09-06 NOTE — Patient Instructions (Addendum)
We removed the steri strips today. Please keep a gauze dressing over the medial portion until it heals. Please see your appointment listed below. You may shower as usual.   Please pick up your medication at the pharmacy. Apply the Silvadene cream once daily to the affected area.

## 2021-09-06 NOTE — Telephone Encounter (Signed)
Spoke with [employer] representative [phone] option 2).  They have received the notice of denial the Oncotype test will still result.  If insurance denies the claim for testing [facility] [facility] will file an appeals.  The patient will be billed for the expenses not covered by insurance.  If the expense is more that $50.00 assistance will be offered to the patient.

## 2021-09-06 NOTE — Progress Notes (Signed)
[facility] POST-OP OFFICE VISIT  09/06/2021  HPI: Emily Tapia is a 60 y.o. 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   She was seen on 09/14 by Dr Grayland Ormond and felt that she will likely require adjuvant chemotherapy for her disease. Pending oncotype which appears insurance has denied prior authorization per chart review.   She presents again for follow up today. She reports that on Monday afternoon she was eating cereal in her kitchen and noticed all the sudden she started to have cold chills, tingling in her tongue, and a sensation of swelling. She called EMTs who checked her out and her vitals were normal and CBG was 240. They thought she may have had an anxiety attack. She was offered transport to the hospital but declined. Since that time she has felt fine She continues to have some axillary soreness at the drain site. Steri-strips are falling off. Drainage has remained >100 ccs daily for the last week including 80 ccs out this morning. The fluid is slightly murky but there is serous fluid and some debris in the tubing. No fever at home. No there complaints.   Vital signs: BP (!) 147/76   Pulse 67   Temp 98.3 F (36.8 C) (Oral)   Ht $R'5\' 4"'Sl$  (1.626 m)   Wt 187 lb 12.8 oz (85.2 kg)   SpO2 98%   BMI 32.24 kg/m    Physical Exam: Constitutional: Well appearing female, NAD Breast: Chaperone present, left mastectomy incision is intact, steri-strips removed, it does appear in the medical portion of the incisions there is some degree of skin necrosis, no evidence of abscess/hematoma/seroma, drain site in the left axilla is CDI, serosanguinous fluid present.   Assessment/Plan: This is a 60 y.o. female  21 days s/p left simple mastectomy and SLNB with Dr Christian Mate for Stage IIa ER/PR positive, HER2 negative multifocal carcinoma  of the left breast with 2 of 3 lymph nodes positive.    - I agree, she likely had what sounds like an anxiety attack on Monday afternoon, appears improved today without issues  - Wound Care; Will place silvadene cream and superficial gauze over medial portion of mastectomy incisions, cover with gauze, continue daily + PRN. Monitor closely.   - Continue drain for now; suspect milky appearance is just serous fluid mixed with some debris, does not appear grossly purulent. Output remain high, so I did not pull this today.   - She will follow up with Dr Christian Mate next week; hopefully for drain removal. Additionally, it does appear she will need adjuvant chemotherapy, so he can also discuss port placement as well  - She has follow up with Dr Grayland Ormond on 09/28 as well  -- Edison Simon, PA-C [facility] 09/06/2021, 10:13 AM [phone] M-F: 7am - 4pm

## 2021-09-07 ENCOUNTER — Encounter: Payer: Self-pay | Admitting: Surgery

## 2021-09-07 ENCOUNTER — Ambulatory Visit (INDEPENDENT_AMBULATORY_CARE_PROVIDER_SITE_OTHER): Payer: 59 | Admitting: Surgery

## 2021-09-07 VITALS — BP 114/77 | HR 71 | Temp 98.5°F | Ht 64.0 in | Wt 186.0 lb

## 2021-09-07 DIAGNOSIS — Z9012 Acquired absence of left breast and nipple: Secondary | ICD-10-CM

## 2021-09-07 DIAGNOSIS — L7634 Postprocedural seroma of skin and subcutaneous tissue following other procedure: Secondary | ICD-10-CM | POA: Diagnosis not present

## 2021-09-07 DIAGNOSIS — T8149XA Infection following a procedure, other surgical site, initial encounter: Secondary | ICD-10-CM | POA: Insufficient documentation

## 2021-09-07 MED ORDER — AMOXICILLIN-POT CLAVULANATE 875-125 MG PO TABS: 1.0000 | ORAL_TABLET | Freq: Two times a day (BID) | ORAL | 0 refills | Status: DC

## 2021-09-07 NOTE — Patient Instructions (Addendum)
Total drained today is 115.  Follow up Tuesday at 1:30 pm.  Call with any questions or problems.

## 2021-09-07 NOTE — Progress Notes (Signed)
[name] presents today, she had an excessive amount of drainage around her indwelling Jackson-Pratt/Blake drain.  It is also changed in character significantly from yesterday.  She had an unusual event that was thought to be a panic attack, but may have been associated with fever chills episode in retrospect. On exam the eschar on the medial aspect of her incision appears essentially unchanged from yesterday per report.  There is no adjacent erythema or induration unremarkable.  There does appear to be some fluid under this area. Drain site appears somewhat angry indurated and somewhat erosive.  The drain output that she presents with has a full bulb of the seropurulent drainage.  There is amazingly no induration on palpation elsewhere there is no appreciable retained seroma aside from that under the medial incision. We drained the bulb, and I felt it prudent to aspirate the fluid retained under the medial aspect of the incision.  So with sterile prep I utilized a syringe and withdrew around 10 mL of the same colored fluid which apparently is not communicating as freely with the drain as I would have hoped. This fluid was then sent for culture and sensitivity.  I will start her on Augmentin, follow-up or cultures.  Hopefully her drain will adequately drain any lingering or persisting abscess present at this time since it now seems to be in communication with what ever has been sequestered away till now. If she reaccumulate's the fluid in the medial aspect of her incision I will likely go ahead and debride her eschar and have complete open drainage at that.  I am reluctant to do that at this point because it would likely render her Jackson-Pratt dysfunctional. Instructions have been reviewed in detail with [name] and her husband, and they will follow-up with me as scheduled on Tuesday next week.  Advised that she if she has recurrence of her fevers and chills, or increasing pain associated with her  left chest wall to call the on-call physician over the weekend.

## 2021-09-08 ENCOUNTER — Encounter: Payer: Self-pay | Admitting: Oncology

## 2021-09-09 NOTE — Progress Notes (Signed)
[place]  [phone]) [phone] Fax:([phone]) [phone]  ID: Emily Tapia OB: 15-Jul-1961  MR#: [id number]  PPJ#:[id number]  Patient Care Team: Tania Ade as PCP - General (Physician Assistant) [name], MD as PCP - Cardiology (Cardiology)  CHIEF COMPLAINT: 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    INTERVAL HISTORY: Patient returns to clinic today to discuss her final pathology results and treatment planning.  She continues to have a drain at the site of her mastectomy, but otherwise is healing well.  She has no neurologic complaints.  She denies any recent fevers or illnesses.  She has a good appetite and denies weight loss.  She has no chest pain, shortness of breath, cough, or hemoptysis.  She denies any nausea, vomiting, constipation, or diarrhea.  She has no urinary complaints.  Patient offers no further specific complaints today.  REVIEW OF SYSTEMS:   Review of Systems  Constitutional: Negative.  Negative for fever, malaise/fatigue and weight loss.  Respiratory: Negative.  Negative for cough and shortness of breath.   Cardiovascular: Negative.  Negative for chest pain and leg swelling.  Gastrointestinal: Negative.  Negative for abdominal pain.  Genitourinary: Negative.  Negative for dysuria.  Musculoskeletal: Negative.  Negative for back pain.  Skin: Negative.  Negative for rash.  Neurological: Negative.  Negative for dizziness, focal weakness, weakness and headaches.  Psychiatric/Behavioral: Negative.  The patient is not nervous/anxious.    As per HPI. Otherwise, a complete review of systems is negative.  PAST MEDICAL HISTORY: Past Medical History:  Diagnosis Date   Anxiety    Aortic stenosis    Diabetes mellitus    Edema    Heart murmur    Hypertension    Sleep apnea    Thyroid disease     PAST SURGICAL HISTORY: Past Surgical History:  Procedure Laterality Date   ABDOMINAL HYSTERECTOMY   11/17/2013   BREAST BIOPSY     x2   BREAST BIOPSY Left 07/20/2021   [place] Bx, Ribbon Clip, Path pending   BREAST BIOPSY Left 07/20/2021   Stereo Bx, X-clip, path pending   BREAST BIOPSY Left 07/20/2021   Stereo biopsy, coil clip, path pending   BREAST BIOPSY Right 07/20/2021   Stereo Bx, Ribbon Clip, path pending   CESAREAN SECTION     x 2   CHOLECYSTECTOMY  12/17/2001   DUE TO STONES   DILATION AND CURETTAGE OF UTERUS     ELBOW SURGERY     POLYPECTOMY  12/17/1993   SIMPLE MASTECTOMY WITH AXILLARY SENTINEL NODE BIOPSY Left 08/16/2021   Procedure: SIMPLE MASTECTOMY WITH AXILLARY SENTINEL NODE BIOPSY;  Surgeon: Ronny Bacon, MD;  Location: [facility] ORS;  Service: General;  Laterality: Left;    FAMILY HISTORY: Family History  Problem Relation Age of Onset   Hyperlipidemia Mother    Thyroid disease Mother    Hashimoto's thyroiditis Mother    Heart disease Father        open heart surgery   Heart failure Father    Diabetes Father    Dementia Father    Thyroid disease Brother    Breast cancer Maternal Grandmother    Diabetes Paternal Grandmother    Prostate cancer Paternal Grandfather     ADVANCED DIRECTIVES (Y/N):  N  HEALTH MAINTENANCE: Social History   Tobacco Use   Smoking status: Former    Packs/day: 1.50    Years: 25.00    Pack years: 37.50    Types: Cigarettes  Quit date: 11/24/2001    Years since quitting: 19.8   Smokeless tobacco: Never  Vaping Use   Vaping Use: Never used  Substance Use Topics   Alcohol use: Yes    Alcohol/week: 0.0 standard drinks    Comment: OCCASIONALLY DRINKS WINE, ONCE A WEEK   Drug use: No     Colonoscopy:  PAP:  Bone density:  Lipid panel:  No Known Allergies  Current Outpatient Medications  Medication Sig Dispense Refill   amoxicillin-clavulanate (AUGMENTIN) 875-125 MG tablet Take 1 tablet by mouth 2 (two) times daily for 7 days. 14 tablet 0   Ascorbic Acid (VITAMIN C PO) Take 1 tablet by mouth daily.     aspirin 81 MG  EC tablet Take 81 mg by mouth at bedtime.     Cholecalciferol (VITAMIN D3) 1000 UNITS CAPS Take 1,000 Units by mouth in the morning.     Dulaglutide (TRULICITY) 3 LF/8.1OF SOPN Inject 3 mg as directed once a week. 2 mL 3   EUTHYROX 75 MCG tablet TAKE 1 TABLET BY MOUTH ONCE DAILY BEFORE BREAKFAST .OFFICE  VIST  NEEDED  BEFORE  NEXT  REFILL  IS  DUE 90 tablet 3   Ferrous Sulfate (IRON) 325 (65 Fe) MG TABS Take 1 tablet by mouth once daily with breakfast 90 tablet 0   furosemide (LASIX) 40 MG tablet TAKE 1 TABLET BY MOUTH ONCE DAILY AS NEEDED FOR FLUID OR EDEMA 90 tablet 1   glucose blood (CONTOUR NEXT TEST) test strip Test fasting sugar each morning. Recheck if having hypoglycemic symptoms. 100 each 4   glyBURIDE (DIABETA) 5 MG tablet TAKE 1 TABLET BY MOUTH TWICE DAILY WITH MEALS. 90 tablet 3   hydrochlorothiazide (HYDRODIURIL) 25 MG tablet Take 1 tablet (25 mg total) by mouth daily. 90 tablet 1   lisinopril (ZESTRIL) 40 MG tablet Take 1 tablet (40 mg total) by mouth daily. 90 tablet 1   metFORMIN (GLUCOPHAGE) 1000 MG tablet Take 1 tablet (1,000 mg total) by mouth 2 (two) times daily with a meal. 180 tablet 3   Multiple Vitamin (MULTIVITAMIN) tablet Take 1 tablet by mouth every morning.     Multiple Vitamins-Minerals (ZINC PO) Take 1 tablet by mouth daily.     PARoxetine (PAXIL) 20 MG tablet Take 1 tablet (20 mg total) by mouth daily. 30 tablet 2   silver sulfADIAZINE (SILVADENE) 1 % cream Apply to affected area daily 25 g 0   carvedilol (COREG) 25 MG tablet Take 1 tablet (25 mg total) by mouth 2 (two) times daily. 180 tablet 3   lidocaine-prilocaine (EMLA) cream Apply to affected area once 30 g 3   ondansetron (ZOFRAN) 8 MG tablet Take 1 tablet (8 mg total) by mouth 2 (two) times daily as needed for refractory nausea / vomiting. 60 tablet 2   prochlorperazine (COMPAZINE) 10 MG tablet Take 1 tablet (10 mg total) by mouth every 6 (six) hours as needed (Nausea or vomiting). 60 tablet 2   No current  facility-administered medications for this visit.    OBJECTIVE: Vitals:   09/13/21 1006  BP: (!) 146/83  Pulse: 64  Resp: 16  Temp: (!) 97.5 F (36.4 C)     Body mass index is 31.88 kg/m.    ECOG FS:0 - Asymptomatic  General: Well-developed, well-nourished, no acute distress. Eyes: Pink conjunctiva, anicteric sclera. HEENT: Normocephalic, moist mucous membranes. Breast: Left mastectomy.  Exam deferred today. Lungs: No audible wheezing or coughing. Heart: Regular rate and rhythm. Abdomen: Soft, nontender, no  obvious distention. Musculoskeletal: No edema, cyanosis, or clubbing. Neuro: Alert, answering all questions appropriately. Cranial nerves grossly intact. Skin: No rashes or petechiae noted. Psych: Normal affect.   LAB RESULTS:  Lab Results  Component Value Date   NA 138 08/08/2021   K 3.7 08/08/2021   CL 99 08/08/2021   CO2 30 08/08/2021   GLUCOSE 110 (H) 08/08/2021   BUN 18 08/08/2021   CREATININE 0.76 08/08/2021   CALCIUM 9.5 08/08/2021   PROT 7.0 08/08/2021   ALBUMIN 4.0 08/08/2021   AST 36 08/08/2021   ALT 37 08/08/2021   ALKPHOS 55 08/08/2021   BILITOT 0.8 08/08/2021   GFRNONAA >60 08/08/2021   GFRAA 111 10/14/2020    Lab Results  Component Value Date   WBC 6.2 08/08/2021   NEUTROABS 3.5 08/08/2021   HGB 12.4 08/08/2021   HCT 38.3 08/08/2021   MCV 82.2 08/08/2021   PLT 133 (L) 08/08/2021     STUDIES: No results found.  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  Previously, right breast biopsy was negative for malignancy.  Given stage and HER2 status of disease, patient will require adjuvant treatment using carboplatinum, Taxotere, Perjeta, and Herceptin every 3 weeks for 6  cycles with Udenyca support and then maintenance Herceptin and Perjeta for an entire year.  Patient also require port placement and MUGA scan prior to initiating treatment.  Patient also benefit from letrozole for 5 years at the conclusion of all of her treatments.  Patient has not fully healed for mastectomy, therefore will have a video assisted telemedicine visit in 2 weeks for further evaluation and treatment planning.  Mastectomy wound: Patient has follow-up with surgery in 1 week.  We will hold adjuvant treatment until patient is fully healed.  I spent a total of 30 minutes reviewing chart data, face-to-face evaluation with the patient, counseling and coordination of care as detailed above.   Patient expressed understanding and was in agreement with this plan. She also understands that She can call clinic at any time with any questions, concerns, or complaints.   Cancer Staging Carcinoma of left breast [facility]) Staging form: Breast, AJCC 8th Edition - Pathologic stage from 08/30/2021: Stage IB (pT2, pN1a, cM0, G3, ER+, PR+, HER2+, Oncotype DX score: 18) - Signed by Lloyd Huger, MD on 09/13/2021 Stage prefix: Initial diagnosis Multigene prognostic tests performed: Oncotype DX Recurrence score range: Greater than or equal to 11 Histologic grading system: 3 grade system  Lloyd Huger, MD   09/15/2021 12:17 PM

## 2021-09-11 ENCOUNTER — Encounter: Payer: Self-pay | Admitting: Oncology

## 2021-09-11 ENCOUNTER — Other Ambulatory Visit: Payer: Self-pay | Admitting: Oncology

## 2021-09-11 NOTE — Telephone Encounter (Signed)
Results available in chart.

## 2021-09-12 ENCOUNTER — Telehealth: Payer: 59 | Admitting: Oncology

## 2021-09-12 ENCOUNTER — Other Ambulatory Visit: Payer: Self-pay

## 2021-09-12 ENCOUNTER — Encounter: Payer: Self-pay | Admitting: Surgery

## 2021-09-12 ENCOUNTER — Ambulatory Visit (INDEPENDENT_AMBULATORY_CARE_PROVIDER_SITE_OTHER): Payer: 59 | Admitting: Surgery

## 2021-09-12 VITALS — BP 138/79 | HR 71 | Temp 98.0°F | Ht 64.0 in | Wt 185.0 lb

## 2021-09-12 DIAGNOSIS — T8149XA Infection following a procedure, other surgical site, initial encounter: Secondary | ICD-10-CM

## 2021-09-12 MED ORDER — AMOXICILLIN-POT CLAVULANATE 875-125 MG PO TABS: 1.0000 | ORAL_TABLET | Freq: Two times a day (BID) | ORAL | 0 refills | Status: AC

## 2021-09-12 NOTE — Patient Instructions (Addendum)
You may shower. Redress the area with silvadene and dry gauze dressing. We have refilled your antibiotics. We will call you if we need to change this.   Follow up here in 1 week.

## 2021-09-12 NOTE — Progress Notes (Signed)
[facility] POST-OP OFFICE VISIT  09/12/2021  HPI: Emily Tapia is a 60 y.o. female 27 days s/p left mastectomy.  She reports feeling significantly better over the last few days.  She has 2 days left of her Augmentin, and we still do not have culture and sensitivity results back yet.   Vital signs: BP 138/79   Pulse 71   Temp 98 F (36.7 C)   Ht 5\' 4"  (1.626 m)   Wt 185 lb (83.9 kg)   SpO2 96%   BMI 31.76 kg/m    Physical Exam: Constitutional: She appears well, nontoxic at all today. Left mastectomy site: Her drainage continues at 160 to 170 mL/day, it is not as red-tinged, nor as purulent with more of a dilute seropurulent character.  She remarkably has no induration no remarkable tenderness to the axilla or lateral chest wall, it is difficult to assess where this drainage is coming from. I can palpate the tip of the Blake drain medial to the eschar, it seems to be tucked away well within the inferior flap, and does not appear to communicate with a spot at the junction of the eschar and the incision where I have expressed some drainage from.  I prepped this area with Betadine, with an 11 blade encouraged a little opening through which I could more aggressively drain fluid from under the eschar.  It somewhat oily consistent with some subcutaneous tissue breakdown.  I feel that I was able to get all of this fluid that apparently does not communicate with the drain.   Assessment/Plan: This is a 60 y.o. female 4 weeks s/p left mastectomy with sentinel lymph node biopsy.  Now with cellulitis abscess of left breast seroma postoperative seroma, currently draining, with deterioration of aspect of medial most skin flaps.  I have drained these without removing the eschar, hoping to continue the suction drain laterally.  I suspect there will be a point where I have to abandon the suction, and go to open drainage.  In the meantime we will continue Silvadene to the eschar,  continuing to anticipate further demarcation.  I will renew her Augmentin and lieu of refining her antibiotic coverage due to the lack of a culture and sensitivity report.  Patient Active Problem List   Diagnosis Date Noted   Infected surgical wound 09/07/2021   Carcinoma of left breast ([place]) 08/30/2021   Status post left mastectomy 08/22/2021   Pelvic mass 07/08/2015   Hyperlipidemia, mixed 07/08/2015   Hx of iron deficiency 07/08/2015   Absolute anemia 04/27/2015   Diabetic peripheral neuropathy associated with type 2 diabetes mellitus ([place]) 04/27/2015   Breath shortness 04/27/2015   Accumulation of fluid in tissues 04/27/2015   Cardiac murmur 04/27/2015   HLD (hyperlipidemia) 04/27/2015   Decreased potassium in the blood 04/27/2015   Hypothyroidism 04/27/2015   Obstructive sleep apnea 04/27/2015   Disorder of peripheral nervous system 04/27/2015   Obstructive apnea 04/27/2015   Pain in shoulder 04/27/2015   Diabetes mellitus, type 2 ([place]) 04/27/2015   Fibroid uterus 10/25/2013   Aortic valve stenosis 05/01/2011   Edema 03/27/2011   SOB (shortness of breath) 03/27/2011   Diabetes mellitus ([place]) 03/27/2011   HTN (hypertension) 03/27/2011   Essential (primary) hypertension 07/05/2006   Anxiety, generalized 07/05/2006    -We will have her follow-up next week.   Ronny Bacon M.D., FACS 09/12/2021, 11:05 AM

## 2021-09-13 ENCOUNTER — Inpatient Hospital Stay (HOSPITAL_BASED_OUTPATIENT_CLINIC_OR_DEPARTMENT_OTHER): Payer: 59 | Admitting: Oncology

## 2021-09-13 ENCOUNTER — Encounter: Payer: Self-pay | Admitting: Oncology

## 2021-09-13 ENCOUNTER — Inpatient Hospital Stay: Payer: 59 | Admitting: Occupational Therapy

## 2021-09-13 VITALS — BP 146/83 | HR 64 | Temp 97.5°F | Resp 16 | Wt 185.7 lb

## 2021-09-13 DIAGNOSIS — C50812 Malignant neoplasm of overlapping sites of left female breast: Secondary | ICD-10-CM | POA: Diagnosis not present

## 2021-09-13 DIAGNOSIS — Z170 Estrogen receptor positive status [ER+]: Secondary | ICD-10-CM

## 2021-09-13 DIAGNOSIS — M25612 Stiffness of left shoulder, not elsewhere classified: Secondary | ICD-10-CM

## 2021-09-13 NOTE — Progress Notes (Signed)
START ON PATHWAY REGIMEN - Breast     Cycle 1: A cycle is 21 days:     Pertuzumab      Trastuzumab-xxxx      Docetaxel      Carboplatin    Cycles 2 through 6: A cycle is every 21 days:     Pertuzumab      Trastuzumab-xxxx      Docetaxel      Carboplatin    Cycles 7 through 17: A cycle is every 21 days:     Pertuzumab      Trastuzumab-xxxx   **Always confirm dose/schedule in your pharmacy ordering system**  Patient Characteristics: Postoperative without Neoadjuvant Therapy (Pathologic Staging), Invasive Disease, Adjuvant Therapy, HER2 Positive, ER Positive, Node Positive, pT2, pN1a or Higher Therapeutic Status: Postoperative without Neoadjuvant Therapy (Pathologic Staging) AJCC Grade: G3 AJCC N Category: pN1a AJCC M Category: cM0 ER Status: Positive (+) AJCC 8 Stage Grouping: IB HER2 Status: Positive (+) Oncotype Dx Recurrence Score: Not Appropriate AJCC T Category: pT2 PR Status: Positive (+) Adjuvant Therapy Status: No Adjuvant Therapy Received Yet or Changing Initial Adjuvant Regimen due to Tolerance Intent of Therapy: Curative Intent, Discussed with Patient 

## 2021-09-13 NOTE — Progress Notes (Signed)
Patient denies new problems/concerns today.   °

## 2021-09-13 NOTE — Therapy (Signed)
[facility] [address] [place], [place], [place], [address] Phone: [phone]   Fax:  [phone]  Occupational Therapy Screen:  Patient Details  Name: Emily Tapia MRN: [id number] Date of Birth: 13-Jan-1961 No data recorded  Encounter Date: 09/13/2021   OT End of Session - 09/13/21 1223     Visit Number 0             Past Medical History:  Diagnosis Date   Anxiety    Aortic stenosis    Diabetes mellitus    Edema    Heart murmur    Hypertension    Sleep apnea    Thyroid disease     Past Surgical History:  Procedure Laterality Date   ABDOMINAL HYSTERECTOMY  11/17/2013   BREAST BIOPSY     x2   BREAST BIOPSY Left 07/20/2021   [place] Bx, Ribbon Clip, Path pending   BREAST BIOPSY Left 07/20/2021   Stereo Bx, X-clip, path pending   BREAST BIOPSY Left 07/20/2021   Stereo biopsy, coil clip, path pending   BREAST BIOPSY Right 07/20/2021   Stereo Bx, Ribbon Clip, path pending   CESAREAN SECTION     x 2   CHOLECYSTECTOMY  12/17/2001   DUE TO STONES   DILATION AND CURETTAGE OF UTERUS     ELBOW SURGERY     POLYPECTOMY  12/17/1993   SIMPLE MASTECTOMY WITH AXILLARY SENTINEL NODE BIOPSY Left 08/16/2021   Procedure: SIMPLE MASTECTOMY WITH AXILLARY SENTINEL NODE BIOPSY;  Surgeon: Ronny Bacon, MD;  Location: [facility] ORS;  Service: General;  Laterality: Left;    There were no vitals filed for this visit.   Subjective Assessment - 09/13/21 1220     Subjective  I had this drain now in for about month and infection -if this can just clear up -and Dr told me today that I am going to have radiation is sounds - see surgeon next week again - cannot reach over head and back with my L arm - suppose to go back to work in 2 wks - work from home on computer                 [place] - 09/13/21 0001       Right Upper Extremity Lymphedema   15 cm Proximal to Olecranon Process 34 cm    10  cm Proximal to Olecranon Process 31.4 cm    Olecranon Process 24.5 cm    10 cm Proximal to Ulnar Styloid Process 20.5 cm    Just Proximal to Ulnar Styloid Process 16 cm      Left Upper Extremity Lymphedema   15 cm Proximal to Olecranon Process 32.5 cm    10 cm Proximal to Olecranon Process 30 cm    Olecranon Process 24 cm    10 cm Proximal to Ulnar Styloid Process 20.4 cm    Just Proximal to Ulnar Styloid Process 16 cm             Dr Christian Mate appt 09/13/21 NOTE:  Physical Exam: Constitutional: She appears well, nontoxic at all today. Left mastectomy site: Her drainage continues at 160 to 170 mL/day, it is not as red-tinged, nor as purulent with more of a dilute seropurulent character.  She remarkably has no induration no remarkable tenderness to the axilla or lateral chest wall, it is difficult to assess where this drainage is coming from. I can palpate the tip of the Blake drain medial to the  eschar, it seems to be tucked away well within the inferior flap, and does not appear to communicate with a spot at the junction of the eschar and the incision where I have expressed some drainage from.  I prepped this area with Betadine, with an 11 blade encouraged a little opening through which I could more aggressively drain fluid from under the eschar.  It somewhat oily consistent with some subcutaneous tissue breakdown.  I feel that I was able to get all of this fluid that apparently does not communicate with the drain.     Assessment/Plan: This is a 60 y.o. female 4 weeks s/p left mastectomy with sentinel lymph node biopsy.  Now with cellulitis abscess of left breast seroma postoperative seroma, currently draining, with deterioration of aspect of medial most skin flaps.  I have drained these without removing the eschar, hoping to continue the suction drain laterally.  I suspect there will be a point where I have to abandon the suction, and go to open drainage.  In the meantime we will continue  Silvadene to the eschar, continuing to anticipate further demarcation.  I will renew her Augmentin and lieu of refining her antibiotic coverage due to the lack of a culture and sensitivity report.    OT SCREEN: Pt arrive with husband - still drain in and on antibiotics. R hand dominant - and follow up appt with surgeon next week. Swelling under L axilla and drain in place, and bandage. Pt show decrease shoulder over head flexion, ABD and ext rotation - did not do any exercises or ROM because of drain. Pt report was told today that she will have probably radiation in future. Pt was ed on lymphedema signs and symptoms ,prevention and treatment. Hand out provided. Base measurements taken this date - see circumference. WNL at this time Pt to follow up with me again in 3 wks                                    Visit Diagnosis: Stiffness of left shoulder, not elsewhere classified    Problem List Patient Active Problem List   Diagnosis Date Noted   Infected surgical wound 09/07/2021   Carcinoma of left breast ([place]) 08/30/2021   Status post left mastectomy 08/22/2021   Pelvic mass 07/08/2015   Hyperlipidemia, mixed 07/08/2015   Hx of iron deficiency 07/08/2015   Absolute anemia 04/27/2015   Diabetic peripheral neuropathy associated with type 2 diabetes mellitus ([place]) 04/27/2015   Breath shortness 04/27/2015   Accumulation of fluid in tissues 04/27/2015   Cardiac murmur 04/27/2015   HLD (hyperlipidemia) 04/27/2015   Decreased potassium in the blood 04/27/2015   Hypothyroidism 04/27/2015   Obstructive sleep apnea 04/27/2015   Disorder of peripheral nervous system 04/27/2015   Obstructive apnea 04/27/2015   Pain in shoulder 04/27/2015   Diabetes mellitus, type 2 ([place]) 04/27/2015   Fibroid uterus 10/25/2013   Aortic valve stenosis 05/01/2011   Edema 03/27/2011   SOB (shortness of breath) 03/27/2011   Diabetes mellitus ([place]) 03/27/2011   HTN (hypertension)  03/27/2011   Essential (primary) hypertension 07/05/2006   Anxiety, generalized 07/05/2006    Rosalyn Gess, OTR/L,CLT 09/13/2021, 12:23 PM  [facility] [address], [place], [place], [address] Phone: [phone]   Fax:  [phone]  Name: Emily Tapia MRN: [id number] Date of Birth: 03/07/1961

## 2021-09-15 ENCOUNTER — Encounter: Payer: Self-pay | Admitting: Oncology

## 2021-09-15 MED ORDER — PROCHLORPERAZINE MALEATE 10 MG PO TABS: 10.0000 mg | ORAL_TABLET | Freq: Four times a day (QID) | ORAL | 2 refills | Status: DC | PRN

## 2021-09-15 MED ORDER — ONDANSETRON HCL 8 MG PO TABS: 8.0000 mg | ORAL_TABLET | Freq: Two times a day (BID) | ORAL | 2 refills | Status: DC | PRN

## 2021-09-15 MED ORDER — LIDOCAINE-PRILOCAINE 2.5-2.5 % EX CREA
TOPICAL_CREAM | CUTANEOUS | 3 refills | Status: DC
Start: 2021-09-15 — End: 2022-06-06

## 2021-09-19 ENCOUNTER — Other Ambulatory Visit: Payer: Self-pay

## 2021-09-19 ENCOUNTER — Encounter: Payer: Self-pay | Admitting: Surgery

## 2021-09-19 ENCOUNTER — Ambulatory Visit (INDEPENDENT_AMBULATORY_CARE_PROVIDER_SITE_OTHER): Payer: 59 | Admitting: Surgery

## 2021-09-19 VITALS — BP 150/80 | HR 72 | Temp 98.4°F | Ht 64.0 in | Wt 183.4 lb

## 2021-09-19 DIAGNOSIS — T8149XA Infection following a procedure, other surgical site, initial encounter: Secondary | ICD-10-CM

## 2021-09-19 DIAGNOSIS — Z9012 Acquired absence of left breast and nipple: Secondary | ICD-10-CM

## 2021-09-19 MED ORDER — AMOXICILLIN-POT CLAVULANATE 875-125 MG PO TABS: 1.0000 | ORAL_TABLET | Freq: Two times a day (BID) | ORAL | 0 refills | Status: AC

## 2021-09-19 NOTE — Patient Instructions (Signed)
Change your dressings every day. You may shower. Remove your dressings first. Let the warm soapy water run over the areas, rinse well, pat dry and redress.   Follow up here next week.

## 2021-09-19 NOTE — Progress Notes (Signed)
[facility] POST-OP OFFICE VISIT  09/19/2021  HPI: Emily Tapia is a 60 y.o. female 34 days s/p left mastectomy.  She is currently been treating an eschar of the medial aspect with Silvadene.  She still has her drain and the effluent has diminished significantly over the last week, but quality appears more serous.  There is no evidence of erythema or cellulitis present.  She is currently taking her Augmentin and has a few more doses left.  She denies fevers and chills and reports significant improvement in how she is feeling.  Vital signs: BP (!) 150/80   Pulse 72   Temp 98.4 F (36.9 C)   Ht 5\' 4"  (1.626 m)   Wt 183 lb 6.4 oz (83.2 kg)   SpO2 93%   BMI 31.48 kg/m    Physical Exam: Constitutional: She looks well Left chest wall: No fluctuance, no fluid, no seroma.  There is no erythema or induration present.  Drain site is worn and rugged from being presents along. Skin: Eschar is well demarcated, with further separation from the adjacent healing perimeter, there is no underlying fluctuance or fluid present.  Remainder end of the incision over the drain appears to be well defined without any evidence of residual fluid induration or fluctuation.  Assessment/Plan: This is a 60 y.o. female 34 days s/p left mastectomy complicated by necrosis of the medial most skin flaps, likely secondary to seroma infection.  Patient Active Problem List   Diagnosis Date Noted   Infected surgical wound 09/07/2021   Carcinoma of left breast ([place]) 08/30/2021   Status post left mastectomy 08/22/2021   Pelvic mass 07/08/2015   Hyperlipidemia, mixed 07/08/2015   Hx of iron deficiency 07/08/2015   Absolute anemia 04/27/2015   Diabetic peripheral neuropathy associated with type 2 diabetes mellitus ([place]) 04/27/2015   Breath shortness 04/27/2015   Accumulation of fluid in tissues 04/27/2015   Cardiac murmur 04/27/2015   HLD (hyperlipidemia) 04/27/2015   Decreased potassium in the  blood 04/27/2015   Hypothyroidism 04/27/2015   Obstructive sleep apnea 04/27/2015   Disorder of peripheral nervous system 04/27/2015   Obstructive apnea 04/27/2015   Pain in shoulder 04/27/2015   Diabetes mellitus, type 2 ([place]) 04/27/2015   Fibroid uterus 10/25/2013   Aortic valve stenosis 05/01/2011   Edema 03/27/2011   SOB (shortness of breath) 03/27/2011   Diabetes mellitus ([place]) 03/27/2011   HTN (hypertension) 03/27/2011   Essential (primary) hypertension 07/05/2006   Anxiety, generalized 07/05/2006    -We will proceed with removal of the drain today and continue to utilize dressings over the drain site anticipating some continuing drainage from that area. Will continue Silvadene dressing changes to the perimeter of the eschar and anticipate excisional debridement after further demarcation.  We will have her back next week for that. I want to keep her on Augmentin for an additional 5 days following drain removal.  As she appears to be tolerating the antibiotic well.  The cultures have been a little use, with a wide variety of skin flora.   Ronny Bacon M.D., FACS 09/19/2021, 9:43 AM

## 2021-09-20 LAB — ANAEROBIC AND AEROBIC CULTURE

## 2021-09-21 ENCOUNTER — Other Ambulatory Visit: Payer: Self-pay

## 2021-09-21 ENCOUNTER — Ambulatory Visit
Admission: RE | Admit: 2021-09-21 | Discharge: 2021-09-21 | Disposition: A | Discharge status: Home / Self Care | Payer: 59 | Source: Ambulatory Visit | Attending: Oncology | Admitting: Oncology

## 2021-09-21 DIAGNOSIS — C50812 Malignant neoplasm of overlapping sites of left female breast: Secondary | ICD-10-CM | POA: Diagnosis present

## 2021-09-21 DIAGNOSIS — Z170 Estrogen receptor positive status [ER+]: Secondary | ICD-10-CM | POA: Diagnosis present

## 2021-09-21 MED ORDER — TECHNETIUM TC 99M-LABELED RED BLOOD CELLS IV KIT
20.0000 | PACK | Freq: Once | INTRAVENOUS | Status: AC | PRN
  Administered 2021-09-21: 20.52 via INTRAVENOUS

## 2021-09-25 NOTE — Progress Notes (Signed)
[place]  [phone] [phone]  ID: Emily Tapia OB: [birth year]  MR#: [id number]  XQJ#:[id number]  Patient Care Team: Margo Common, PA-C (Inactive) as PCP - General (Physician Assistant) [name], MD as PCP - Cardiology (Cardiology) Ronny Bacon, MD as Consulting Physician (General Surgery)  I connected with Emily Tapia on 09/25/21 at  2:30 PM EDT by video enabled telemedicine visit and verified that I am speaking with the correct person using two identifiers.   I discussed the limitations, risks, security and privacy concerns of performing an evaluation and management service by telemedicine and the availability of in-person appointments. I also discussed with the patient that there may be a patient responsible charge related to this service. The patient expressed understanding and agreed to proceed.   Other persons participating in the visit and their role in the encounter: Patient, MD.  Patient's location: Home. Provider's location: Clinic.  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    INTERVAL HISTORY: Patient agreed to video assisted telemedicine visit for further evaluation and additional treatment planning.  Visit was transitioned to secondary to audio difficulties. She saw surgery earlier today and continues to have wound packing, but feels that it is healing.  She otherwise feels well.  She has no neurologic complaints.  She denies any recent fevers or illnesses.  She has a good appetite and denies weight loss.  She has no chest pain, shortness of breath, cough, or hemoptysis.  She denies any nausea, vomiting, constipation, or diarrhea.  She has no urinary complaints.  Patient offers no further specific complaints today.  REVIEW OF SYSTEMS:   Review of Systems  Constitutional: Negative.  Negative for fever, malaise/fatigue and weight loss.  Respiratory:  Negative.  Negative for cough and shortness of breath.   Cardiovascular: Negative.  Negative for chest pain and leg swelling.  Gastrointestinal: Negative.  Negative for abdominal pain.  Genitourinary: Negative.  Negative for dysuria.  Musculoskeletal: Negative.  Negative for back pain.  Skin: Negative.  Negative for rash.  Neurological: Negative.  Negative for dizziness, focal weakness, weakness and headaches.  Psychiatric/Behavioral: Negative.  The patient is not nervous/anxious.    As per HPI. Otherwise, a complete review of systems is negative.  PAST MEDICAL HISTORY: Past Medical History:  Diagnosis Date   Anxiety    Aortic stenosis    Diabetes mellitus    Edema    Heart murmur    Hypertension    Sleep apnea    Thyroid disease     PAST SURGICAL HISTORY: Past Surgical History:  Procedure Laterality Date   ABDOMINAL HYSTERECTOMY  11/17/2013   BREAST BIOPSY     x2   BREAST BIOPSY Left 07/20/2021   [place] Bx, Ribbon Clip, Path pending   BREAST BIOPSY Left 07/20/2021   Stereo Bx, X-clip, path pending   BREAST BIOPSY Left 07/20/2021   Stereo biopsy, coil clip, path pending   BREAST BIOPSY Right 07/20/2021   Stereo Bx, Ribbon Clip, path pending   CESAREAN SECTION     x 2   CHOLECYSTECTOMY  12/17/2001   DUE TO STONES   DILATION AND CURETTAGE OF UTERUS     ELBOW SURGERY     POLYPECTOMY  12/17/1993   SIMPLE MASTECTOMY WITH AXILLARY SENTINEL NODE BIOPSY Left 08/16/2021   Procedure: SIMPLE MASTECTOMY WITH AXILLARY SENTINEL NODE BIOPSY;  Surgeon: Ronny Bacon, MD;  Location: [facility] ORS;  Service: General;  Laterality: Left;    FAMILY  HISTORY: Family History  Problem Relation Age of Onset   Hyperlipidemia Mother    Thyroid disease Mother    Hashimoto's thyroiditis Mother    Heart disease Father        open heart surgery   Heart failure Father    Diabetes Father    Dementia Father    Thyroid disease Brother    Breast cancer Maternal Grandmother    Diabetes Paternal  Grandmother    Prostate cancer Paternal Grandfather     ADVANCED DIRECTIVES (Y/N):  N  HEALTH MAINTENANCE: Social History   Tobacco Use   Smoking status: Former    Packs/day: 1.50    Years: 25.00    Pack years: 37.50    Types: Cigarettes    Quit date: 11/24/2001    Years since quitting: 19.8   Smokeless tobacco: Never  Vaping Use   Vaping Use: Never used  Substance Use Topics   Alcohol use: Yes    Alcohol/week: 0.0 standard drinks    Comment: OCCASIONALLY DRINKS WINE, ONCE A WEEK   Drug use: No     Colonoscopy:  PAP:  Bone density:  Lipid panel:  No Known Allergies  Current Outpatient Medications  Medication Sig Dispense Refill   Ascorbic Acid (VITAMIN C PO) Take 1 tablet by mouth daily.     aspirin 81 MG EC tablet Take 81 mg by mouth at bedtime.     carvedilol (COREG) 25 MG tablet Take 1 tablet (25 mg total) by mouth 2 (two) times daily. 180 tablet 3   Cholecalciferol (VITAMIN D3) 1000 UNITS CAPS Take 1,000 Units by mouth in the morning.     Dulaglutide (TRULICITY) 3 SE/8.3TD SOPN Inject 3 mg as directed once a week. 2 mL 3   EUTHYROX 75 MCG tablet TAKE 1 TABLET BY MOUTH ONCE DAILY BEFORE BREAKFAST .OFFICE  VIST  NEEDED  BEFORE  NEXT  REFILL  IS  DUE 90 tablet 3   Ferrous Sulfate (IRON) 325 (65 Fe) MG TABS Take 1 tablet by mouth once daily with breakfast 90 tablet 0   furosemide (LASIX) 40 MG tablet TAKE 1 TABLET BY MOUTH ONCE DAILY AS NEEDED FOR FLUID OR EDEMA 90 tablet 1   glucose blood (CONTOUR NEXT TEST) test strip Test fasting sugar each morning. Recheck if having hypoglycemic symptoms. 100 each 4   glyBURIDE (DIABETA) 5 MG tablet TAKE 1 TABLET BY MOUTH TWICE DAILY WITH MEALS. 90 tablet 3   hydrochlorothiazide (HYDRODIURIL) 25 MG tablet Take 1 tablet (25 mg total) by mouth daily. 90 tablet 1   lidocaine-prilocaine (EMLA) cream Apply to affected area once 30 g 3   lisinopril (ZESTRIL) 40 MG tablet Take 1 tablet (40 mg total) by mouth daily. 90 tablet 1   metFORMIN  (GLUCOPHAGE) 1000 MG tablet Take 1 tablet (1,000 mg total) by mouth 2 (two) times daily with a meal. 180 tablet 3   Multiple Vitamin (MULTIVITAMIN) tablet Take 1 tablet by mouth every morning.     Multiple Vitamins-Minerals (ZINC PO) Take 1 tablet by mouth daily.     ondansetron (ZOFRAN) 8 MG tablet Take 1 tablet (8 mg total) by mouth 2 (two) times daily as needed for refractory nausea / vomiting. 60 tablet 2   PARoxetine (PAXIL) 20 MG tablet Take 1 tablet (20 mg total) by mouth daily. 30 tablet 2   prochlorperazine (COMPAZINE) 10 MG tablet Take 1 tablet (10 mg total) by mouth every 6 (six) hours as needed (Nausea or vomiting). 60 tablet 2  silver sulfADIAZINE (SILVADENE) 1 % cream Apply to affected area daily 25 g 0   No current facility-administered medications for this visit.    OBJECTIVE: There were no vitals filed for this visit.    There is no height or weight on file to calculate BMI.    ECOG FS:0 - Asymptomatic  General: Well-developed, well-nourished, no acute distress. HEENT: Normocephalic. Neuro: Alert, answering all questions appropriately. Cranial nerves grossly intact. Psych: Normal affect.   LAB RESULTS:  Lab Results  Component Value Date   NA 138 08/08/2021   K 3.7 08/08/2021   CL 99 08/08/2021   CO2 30 08/08/2021   GLUCOSE 110 (H) 08/08/2021   BUN 18 08/08/2021   CREATININE 0.76 08/08/2021   CALCIUM 9.5 08/08/2021   PROT 7.0 08/08/2021   ALBUMIN 4.0 08/08/2021   AST 36 08/08/2021   ALT 37 08/08/2021   ALKPHOS 55 08/08/2021   BILITOT 0.8 08/08/2021   GFRNONAA >60 08/08/2021   GFRAA 111 10/14/2020    Lab Results  Component Value Date   WBC 6.2 08/08/2021   NEUTROABS 3.5 08/08/2021   HGB 12.4 08/08/2021   HCT 38.3 08/08/2021   MCV 82.2 08/08/2021   PLT 133 (L) 08/08/2021     STUDIES: NM Cardiac Muga Rest  Result Date: 09/21/2021 CLINICAL DATA:  LEFT breast cancer, pre cardiotoxic chemotherapy EXAM: NUCLEAR MEDICINE CARDIAC BLOOD POOL IMAGING  (MUGA) TECHNIQUE: Cardiac multi-gated acquisition was performed at rest following intravenous injection of Tc-45m labeled red blood cells. RADIOPHARMACEUTICALS:  20.52 mCi Tc-31m pertechnetate in-vitro labeled red blood cells IV COMPARISON:  None FINDINGS: Calculated LEFT ventricular ejection fraction is 69.3%, normal. Study was obtained at a cardiac rate of 76 bpm. Patient was fairly rhythmic during imaging. Cine analysis of the LEFT ventricle in 3 projections demonstrates normal LV wall motion. IMPRESSION: Normal LEFT ventricular ejection fraction of 69.3% with normal LV wall motion. Electronically Signed   By: Lavonia Dana M.D.   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  Previously, right breast biopsy was negative for malignancy.  Given stage and HER2 status of disease, patient will require adjuvant treatment using carboplatinum, Taxotere, Perjeta, and Herceptin every 3 weeks for 6 cycles with Udenyca support and then maintenance Herceptin and Perjeta every 3 weeks for an entire year.  She will also benefit from letrozole for 5 years at the conclusion of all of her treatments.  MUGA scan from September 21, 2021 revealed a EF of 69.3%.  Patient will require port placement.  Case discussed with surgery and patient has not fully healed from her mastectomy, therefore will delay treatment until October 27, 2021.   Mastectomy wound: Continue follow-up with surgery as scheduled.  Delay treatment as above.   I provided 20 minutes of face-to-face video visit time during this encounter which included chart review, counseling, and coordination of care as documented above.    Patient expressed understanding and was in  agreement with this plan. She also understands that She can call clinic at any time with any questions, concerns, or complaints.   Cancer Staging Carcinoma of left breast [facility]) Staging form: Breast, AJCC 8th Edition - Pathologic stage from 08/30/2021: Stage IB (pT2, pN1a,  cM0, G3, ER+, PR+, HER2+, Oncotype DX score: 18) - Signed by Lloyd Huger, MD on 09/13/2021 Stage prefix: Initial diagnosis Multigene prognostic tests performed: Oncotype DX Recurrence score range: Greater than or equal to 11 Histologic grading system: 3 grade system  Lloyd Huger, MD   09/25/2021 1:46 PM

## 2021-09-26 ENCOUNTER — Other Ambulatory Visit: Payer: Self-pay

## 2021-09-26 ENCOUNTER — Encounter: Payer: Self-pay | Admitting: Oncology

## 2021-09-26 ENCOUNTER — Ambulatory Visit (INDEPENDENT_AMBULATORY_CARE_PROVIDER_SITE_OTHER): Payer: 59 | Admitting: Surgery

## 2021-09-26 ENCOUNTER — Encounter: Payer: Self-pay | Admitting: Surgery

## 2021-09-26 ENCOUNTER — Inpatient Hospital Stay: Payer: 59 | Attending: Oncology | Admitting: Oncology

## 2021-09-26 VITALS — BP 129/78 | HR 74 | Temp 98.8°F | Ht 64.0 in | Wt 184.8 lb

## 2021-09-26 DIAGNOSIS — T8149XA Infection following a procedure, other surgical site, initial encounter: Secondary | ICD-10-CM

## 2021-09-26 DIAGNOSIS — C50812 Malignant neoplasm of overlapping sites of left female breast: Secondary | ICD-10-CM | POA: Diagnosis not present

## 2021-09-26 DIAGNOSIS — Z170 Estrogen receptor positive status [ER+]: Secondary | ICD-10-CM | POA: Diagnosis not present

## 2021-09-26 DIAGNOSIS — Z09 Encounter for follow-up examination after completed treatment for conditions other than malignant neoplasm: Secondary | ICD-10-CM

## 2021-09-26 DIAGNOSIS — Z9012 Acquired absence of left breast and nipple: Secondary | ICD-10-CM

## 2021-09-26 NOTE — Progress Notes (Signed)
Patient called/ pre- screened for virtual appoinment today, concerns of nausea and constipation

## 2021-09-26 NOTE — Progress Notes (Addendum)
Emily Tapia is an established patient of Dr. Christian Mate.  Referral for port placement entered on 09/13/21.  Confirmed with Dr. Forest Becker office (on 09/19/21) that the referral was received but patient is being treated for infection and Dr. Christian Mate will wait before placing port a cath (he anticipates port to be placed 2nd week in November)

## 2021-09-26 NOTE — Progress Notes (Signed)
Ms. Wix returns today, she is noted more serous soupy drainage from her eschar.  She otherwise looks great, her drain site is healing well and is clean.  There is no areas of fluctuance consistent with residual seroma.  She does note some twinges of tightness feeling in her left pectoral deltoid region, likely due to not working on her range of motion.  The eschar of the medial most aspect of her left chest wall incision from her mastectomy is a very well demarcated, and we will proceed with excision of the eschar today and initiate open wound care.  With informed consent we placed her in a supine position.  I prepped the area with Betadine and we used a fenestrated drape.  With sterile technique I proceeded with sharp excision of the eschar which was markedly undergone liquefactive necrosis in 1 area, and very dry over the lower perimeter.  I was able to define a good line of division excising away of this necrotic tissue from the adjacent viable tissue.  She tolerated this very well without any additional local anesthetic.  Hemostasis was also easily controlled.  Is allowed drainage of the underlying soft tissues.  I marked out this cavity with dry gauze, and subsequently packed it moistened it with normal saline solution.  We gave instructions for local wound care to be done once to twice daily as needed.  I believe she will initiate saline dressing changes and follow-up in a week to 10 days to evaluate the progress of her wound.  I do not believe it is wise to consider placement of a Port-A-Cath at this time, though the infection is clearly resolved/resolving, I would like to see less of an open wound prior to placement of a port for chemotherapy.

## 2021-09-26 NOTE — Patient Instructions (Signed)
Put a dry gauze in wound twice daily and wet it with salt water or saline water. Do not emerge in water.   If you have any concerns or questions, please feel free to call our office. See follow up appointment below.    Wound Care, Adult  Taking care of your wound properly can help to prevent pain, infection, and scarring. It can also help your wound heal more quickly. Follow instructions from your health care provider about how to care for your wound. Supplies needed: Soap and water. Wound cleanser. Gauze. If needed, a clean bandage (dressing) or other type of wound dressing material to cover or place in the wound. Follow your health care provider's instructions about what dressing supplies to use. Cream or ointment to apply to the wound, if told by your health care provider. How to care for your wound Cleaning the wound Ask your health care provider how to clean the wound. This may include: Using mild soap and water or a wound cleanser. Using a clean gauze to pat the wound dry after cleaning it. Do not rub or scrub the wound. Dressing care Wash your hands with soap and water for at least 20 seconds before and after you change the dressing. If soap and water are not available, use hand sanitizer. Change your dressing as told by your health care provider. This may include: Cleaning or rinsing out (irrigating) the wound. Placing a dressing over the wound or in the wound (packing). Covering the wound with an outer dressing. Leave any stitches (sutures), skin glue, or adhesive strips in place. These skin closures may need to stay in place for 2 weeks or longer. If adhesive strip edges start to loosen and curl up, you may trim the loose edges. Do not remove adhesive strips completely unless your health care provider tells you to do that. Ask your health care provider when you can leave the wound uncovered. Checking for infection Check your wound area every day for signs of infection. Check  for: More redness, swelling, or pain. Fluid or blood. Warmth. Pus or a bad smell.  Follow these instructions at home Medicines If you were prescribed an antibiotic medicine, cream, or ointment, take or apply it as told by your health care provider. Do not stop using the antibiotic even if your condition improves. If you were prescribed pain medicine, take it 30 minutes before you do any wound care or as told by your health care provider. Take over-the-counter and prescription medicines only as told by your health care provider. Eating and drinking Eat a diet that includes protein, vitamin A, vitamin C, and other nutrient-rich foods to help the wound heal. Foods rich in protein include meat, fish, eggs, dairy, beans, and nuts. Foods rich in vitamin A include carrots and dark green, leafy vegetables. Foods rich in vitamin C include citrus fruits, tomatoes, broccoli, and peppers. Drink enough fluid to keep your urine pale yellow. General instructions Do not take baths, swim, use a hot tub, or do anything that would put the wound underwater until your health care provider approves. Ask your health care provider if you may take showers. You may only be allowed to take sponge baths. Do not scratch or pick at the wound. Keep it covered as told by your health care provider. Return to your normal activities as told by your health care provider. Ask your health care provider what activities are safe for you. Protect your wound from the sun when you are outside for  the first 6 months, or for as long as told by your health care provider. Cover up the scar area or apply sunscreen that has an SPF of at least 60. Do not use any products that contain nicotine or tobacco, such as cigarettes, e-cigarettes, and chewing tobacco. These may delay wound healing. If you need help quitting, ask your health care provider. Keep all follow-up visits as told by your health care provider. This is important. Contact a  health care provider if: You received a tetanus shot and you have swelling, severe pain, redness, or bleeding at the injection site. Your pain is not controlled with medicine. You have any of these signs of infection: More redness, swelling, or pain around the wound. Fluid or blood coming from the wound. Warmth coming from the wound. Pus or a bad smell coming from the wound. A fever or chills. You are nauseous or you vomit. You are dizzy. Get help right away if: You have a red streak of skin near the area around your wound. Your wound has been closed with staples, sutures, skin glue, or adhesive strips and it begins to open up and separate. Your wound is bleeding, and the bleeding does not stop with gentle pressure. You have a rash. You faint. You have trouble breathing. These symptoms may represent a serious problem that is an emergency. Do not wait to see if the symptoms will go away. Get medical help right away. Call your local emergency services (911 in the [place] Do not drive yourself to the hospital. Summary Always wash your hands with soap and water for at least 20 seconds before and after changing your dressing. Change your dressing as told by your health care provider. To help with healing, eat foods that are rich in protein, vitamin A, vitamin C, and other nutrients. Check your wound every day for signs of infection. Contact your health care provider if you suspect that your wound is infected. This information is not intended to replace advice given to you by your health care provider. Make sure you discuss any questions you have with your health care provider. Document Revised: 09/18/2019 Document Reviewed: 09/18/2019 Elsevier Patient Education  2022 [employer].

## 2021-09-27 ENCOUNTER — Telehealth: Payer: 59 | Admitting: Oncology

## 2021-10-02 ENCOUNTER — Ambulatory Visit: Payer: 59 | Attending: Family Medicine | Admitting: Occupational Therapy

## 2021-10-02 DIAGNOSIS — L905 Scar conditions and fibrosis of skin: Secondary | ICD-10-CM | POA: Insufficient documentation

## 2021-10-02 NOTE — Therapy (Signed)
[facility] [address] [place], [address] Phone: [phone]   Fax:  [phone]  Occupational Therapy Screen:  Patient Details  Name: Emily Tapia MRN: [id number] Date of Birth: [birth year] No data recorded  Encounter Date: 10/02/2021   OT End of Session - 10/02/21 1708     Visit Number 0             Past Medical History:  Diagnosis Date   Anxiety    Aortic stenosis    Diabetes mellitus    Edema    Heart murmur    Hypertension    Sleep apnea    Thyroid disease     Past Surgical History:  Procedure Laterality Date   ABDOMINAL HYSTERECTOMY  11/17/2013   BREAST BIOPSY     x2   BREAST BIOPSY Left 07/20/2021   [place] Bx, Ribbon Clip, Path pending   BREAST BIOPSY Left 07/20/2021   Stereo Bx, X-clip, path pending   BREAST BIOPSY Left 07/20/2021   Stereo biopsy, coil clip, path pending   BREAST BIOPSY Right 07/20/2021   Stereo Bx, Ribbon Clip, path pending   CESAREAN SECTION     x 2   CHOLECYSTECTOMY  12/17/2001   DUE TO STONES   DILATION AND CURETTAGE OF UTERUS     ELBOW SURGERY     POLYPECTOMY  12/17/1993   SIMPLE MASTECTOMY WITH AXILLARY SENTINEL NODE BIOPSY Left 08/16/2021   Procedure: SIMPLE MASTECTOMY WITH AXILLARY SENTINEL NODE BIOPSY;  Surgeon: Ronny Bacon, MD;  Location: [facility] ORS;  Service: General;  Laterality: Left;    There were no vitals filed for this visit.   Subjective Assessment - 10/02/21 1707     Subjective  Surgeon did some cleaning of my incision - so stil open - appt with him the 20th - did start back at work - I do have this pull from my armpit down into my elbow to forearm                  LYMPHEDEMA/ONCOLOGY QUESTIONNAIRE - 09/13/21 0001                Right Upper Extremity Lymphedema    15 cm Proximal to Olecranon Process 34 cm     10 cm Proximal to Olecranon Process 31.4 cm     Olecranon Process 24.5 cm     10 cm Proximal to Ulnar  Styloid Process 20.5 cm     Just Proximal to Ulnar Styloid Process 16 cm          Left Upper Extremity Lymphedema    15 cm Proximal to Olecranon Process 32.5 cm     10 cm Proximal to Olecranon Process 30 cm     Olecranon Process 24 cm     10 cm Proximal to Ulnar Styloid Process 20.4 cm     Just Proximal to Ulnar Styloid Process 16 cm                   Dr Christian Mate appt 09/13/21 NOTE:   Physical Exam: Constitutional: She appears well, nontoxic at all today. Left mastectomy site: Her drainage continues at 160 to 170 mL/day, it is not as red-tinged, nor as purulent with more of a dilute seropurulent character.  She remarkably has no induration no remarkable tenderness to the axilla or lateral chest wall, it is difficult to assess where this drainage is coming from. I can palpate the tip of the  Blake drain medial to the eschar, it seems to be tucked away well within the inferior flap, and does not appear to communicate with a spot at the junction of the eschar and the incision where I have expressed some drainage from.  I prepped this area with Betadine, with an 11 blade encouraged a little opening through which I could more aggressively drain fluid from under the eschar.  It somewhat oily consistent with some subcutaneous tissue breakdown.  I feel that I was able to get all of this fluid that apparently does not communicate with the drain.     Assessment/Plan: This is a 60 y.o. female 4 weeks s/p left mastectomy with sentinel lymph node biopsy.  Now with cellulitis abscess of left breast seroma postoperative seroma, currently draining, with deterioration of aspect of medial most skin flaps.  I have drained these without removing the eschar, hoping to continue the suction drain laterally.  I suspect there will be a point where I have to abandon the suction, and go to open drainage.  In the meantime we will continue Silvadene to the eschar, continuing to anticipate further demarcation.  I will  renew her Augmentin and lieu of refining her antibiotic coverage due to the lack of a culture and sensitivity report.       OT SCREEN 09/13/21: Pt arrive with husband - still drain in and on antibiotics. R hand dominant - and follow up appt with surgeon next week. Swelling under L axilla and drain in place, and bandage. Pt show decrease shoulder over head flexion, ABD and ext rotation - did not do any exercises or ROM because of drain. Pt report was told today that she will have probably radiation in future. Pt was ed on lymphedema signs and symptoms ,prevention and treatment. Hand out provided. Base measurements taken this date - see circumference. WNL at this time Pt to follow up with me again in 3 wks   09/26/21 DR Christian Mate NOTE:    Ms. Longhi returns today, she is noted more serous soupy drainage from her eschar.  She otherwise looks great, her drain site is healing well and is clean.  There is no areas of fluctuance consistent with residual seroma.  She does note some twinges of tightness feeling in her left pectoral deltoid region, likely due to not working on her range of motion.   The eschar of the medial most aspect of her left chest wall incision from her mastectomy is a very well demarcated, and we will proceed with excision of the eschar today and initiate open wound care.   With informed consent we placed her in a supine position.  I prepped the area with Betadine and we used a fenestrated drape.  With sterile technique I proceeded with sharp excision of the eschar which was markedly undergone liquefactive necrosis in 1 area, and very dry over the lower perimeter.  I was able to define a good line of division excising away of this necrotic tissue from the adjacent viable tissue.  She tolerated this very well without any additional local anesthetic.  Hemostasis was also easily controlled.  Is allowed drainage of the underlying soft tissues.  I marked out this cavity with dry gauze, and  subsequently packed it moistened it with normal saline solution.   We gave instructions for local wound care to be done once to twice daily as needed.  I believe she will initiate saline dressing changes and follow-up in a week to 10 days to  evaluate the progress of her wound.   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-  She arrive this date with some reports of pull from her armpit into her elbow and forearm - appear pt have some cording - release  it in axilla with less pull in upper arm but still continues into anterior elbow to forearm - but unable to release further - painfull - will check on it again in 2 wks Pt's L upper arm circumference increase by 1 cm this date compare to L - will monitor- appt with surgeon on 20th check up on would care- Pt can do some below 90 degrees for shoulder and elbow AROM glides -and ask surgeon if can do supine - over head AAROM shoulder flexion and ABD  Follow up in 2 wks again                                    Patient will benefit from skilled therapeutic intervention in order to improve the following deficits and impairments:           Visit Diagnosis: Scar condition and fibrosis of skin    Problem List Patient Active Problem List   Diagnosis Date Noted   Infected surgical wound 09/07/2021   Carcinoma of left breast ([place]) 08/30/2021   Status post left mastectomy 08/22/2021   Other peripheral vertigo, unspecified ear 01/02/2020   Pelvic mass 07/08/2015   Hyperlipidemia, mixed 07/08/2015   Hx of iron deficiency 07/08/2015   Absolute anemia 04/27/2015   Diabetic peripheral neuropathy associated with type 2 diabetes mellitus ([place]) 04/27/2015   Breath  shortness 04/27/2015   Accumulation of fluid in tissues 04/27/2015   Cardiac murmur 04/27/2015   HLD (hyperlipidemia) 04/27/2015   Decreased potassium in the blood 04/27/2015   Hypothyroidism 04/27/2015   Obstructive sleep apnea 04/27/2015   Disorder of peripheral nervous system 04/27/2015   Obstructive apnea 04/27/2015   Pain in shoulder 04/27/2015   Diabetes mellitus, type 2 ([place]) 04/27/2015   Fibroid uterus 10/25/2013   Aortic valve stenosis 05/01/2011   Edema 03/27/2011   SOB (shortness of breath) 03/27/2011   Diabetes mellitus ([place]) 03/27/2011   HTN (hypertension) 03/27/2011   Essential (primary) hypertension 07/05/2006   Anxiety, generalized 07/05/2006    Rosalyn Gess, OTR/L,CLT 10/02/2021, 5:09 PM  [facility] [address], [place], [address] Phone: [phone]   Fax:  [phone]  Name: Emily Tapia MRN: [id number] Date of Birth: [birth year]

## 2021-10-03 ENCOUNTER — Other Ambulatory Visit: Payer: Self-pay

## 2021-10-03 ENCOUNTER — Inpatient Hospital Stay: Payer: 59

## 2021-10-04 ENCOUNTER — Telehealth: Payer: Self-pay | Admitting: Surgery

## 2021-10-04 NOTE — Telephone Encounter (Addendum)
Patient called in an attempt to follow up on Short Term Disability paperwork that has been recently completed.   Please call back for further details & assistance for the patient.  Emily Tapia states the only time she will not be available is between 12-12:30 (lunchtime).  Thank you

## 2021-10-05 ENCOUNTER — Encounter: Payer: Self-pay | Admitting: Surgery

## 2021-10-05 ENCOUNTER — Ambulatory Visit (INDEPENDENT_AMBULATORY_CARE_PROVIDER_SITE_OTHER): Payer: 59 | Admitting: Surgery

## 2021-10-05 ENCOUNTER — Telehealth: Payer: Self-pay | Admitting: *Deleted

## 2021-10-05 ENCOUNTER — Other Ambulatory Visit: Payer: Self-pay

## 2021-10-05 VITALS — BP 145/89 | HR 71 | Temp 98.3°F | Ht 64.0 in | Wt 182.0 lb

## 2021-10-05 DIAGNOSIS — T8149XD Infection following a procedure, other surgical site, subsequent encounter: Secondary | ICD-10-CM

## 2021-10-05 DIAGNOSIS — Z09 Encounter for follow-up examination after completed treatment for conditions other than malignant neoplasm: Secondary | ICD-10-CM

## 2021-10-05 DIAGNOSIS — T8149XA Infection following a procedure, other surgical site, initial encounter: Secondary | ICD-10-CM

## 2021-10-05 NOTE — Progress Notes (Signed)
Emily Tapia's wound is making good progress its extremely clean with 98% granulation, significantly smaller in size.  I applied some topical anesthetic the little debridement of some of the lagging skin edges, and approximated some of her wound with some Steri-Strips on Mastisol.  We will continue her dressing changes and keep the exposed open area of her wound moist.  I will have her back in 2 weeks for follow-up.  Uncertain of her wound will be at a stage where we can begin chemotherapy by November 11.

## 2021-10-05 NOTE — Telephone Encounter (Signed)
Faxed FMLA to [employer] at [phone] 

## 2021-10-05 NOTE — Patient Instructions (Signed)
You may pack the area once a day. You may need to squirt some saline on the dressing to keep it moist.   Follow up here in 2 weeks.

## 2021-10-09 IMAGING — MG MM BREAST LOCALIZATION CLIP
4 series · 4 of 12 positions shown · non-contrast
Comparison: Previous exam(s).

CLINICAL DATA: Status post bilateral breast biopsies today.

EXAM:
3D DIAGNOSTIC BILATERAL MAMMOGRAM POST ULTRASOUND AND STEREOTACTIC
BIOPSIES

[R CC synth-2D]
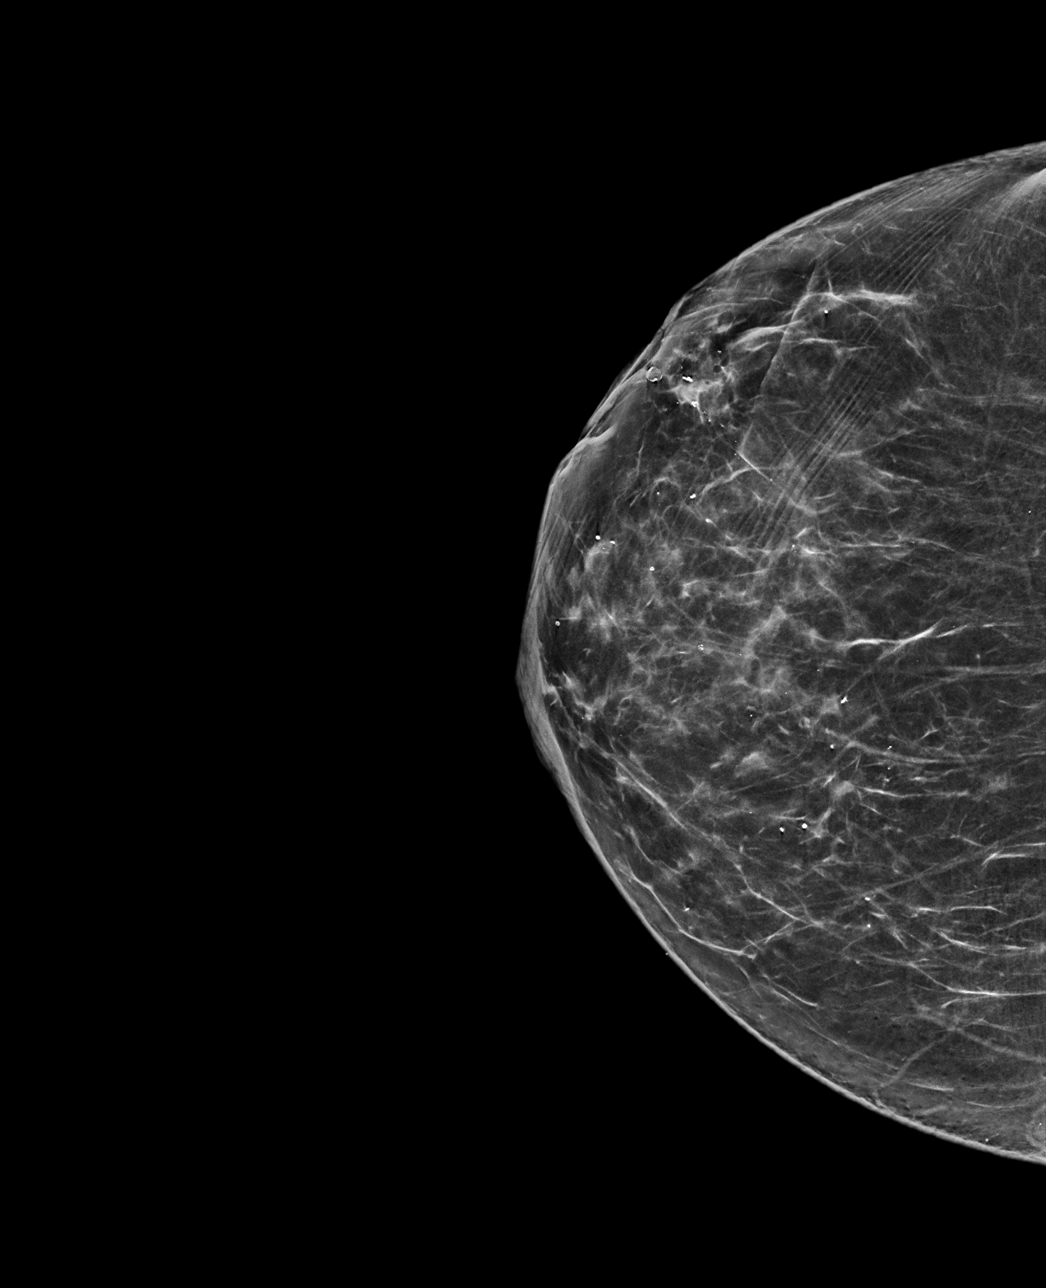

[R ML synth-2D]
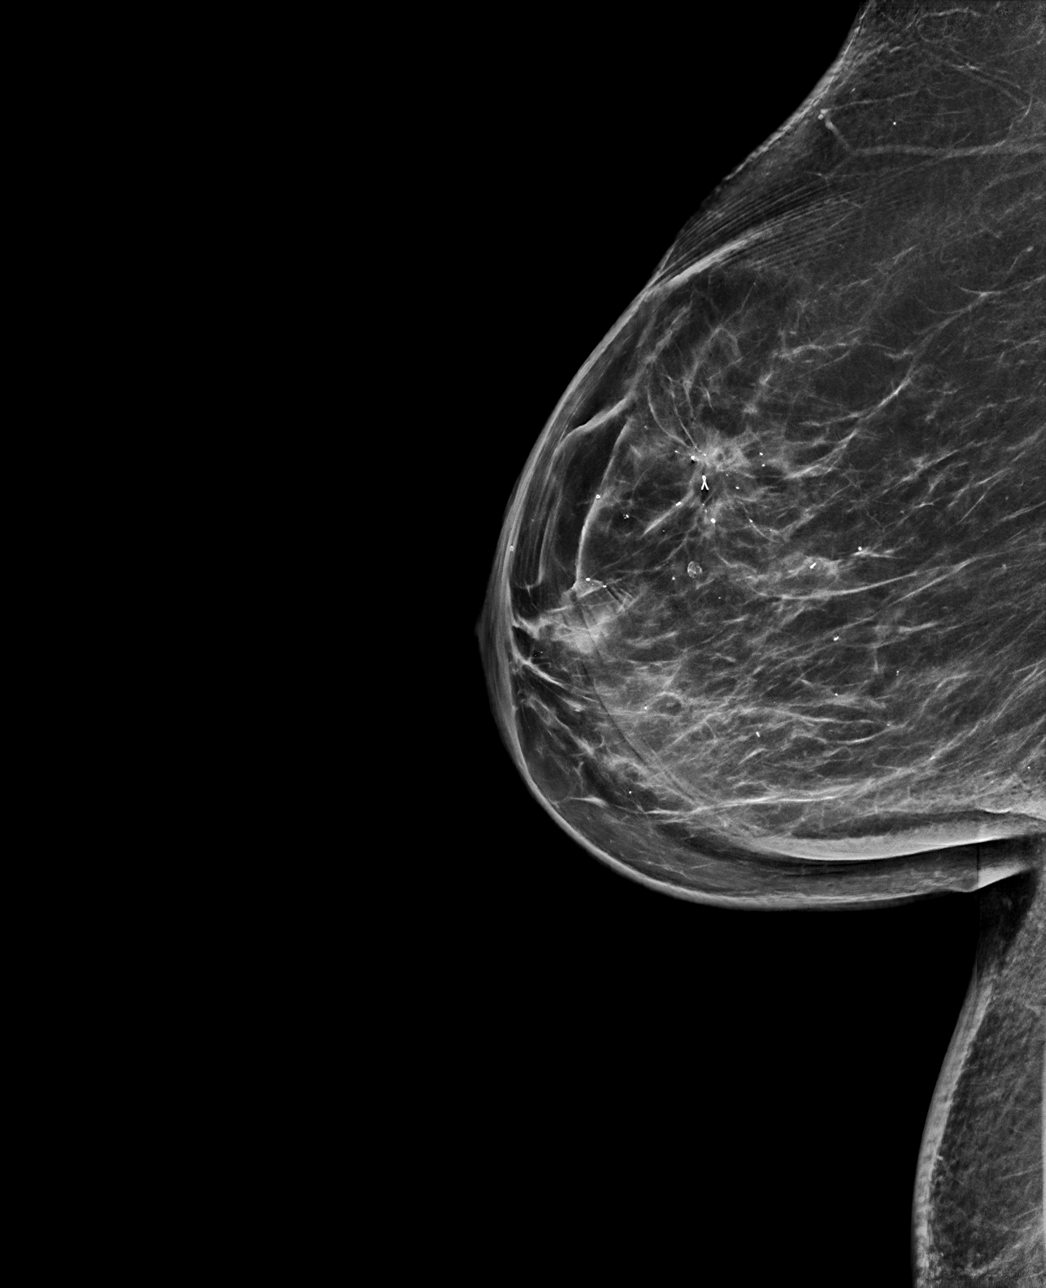

[R CC tomo · tomo slice 35/70.0]
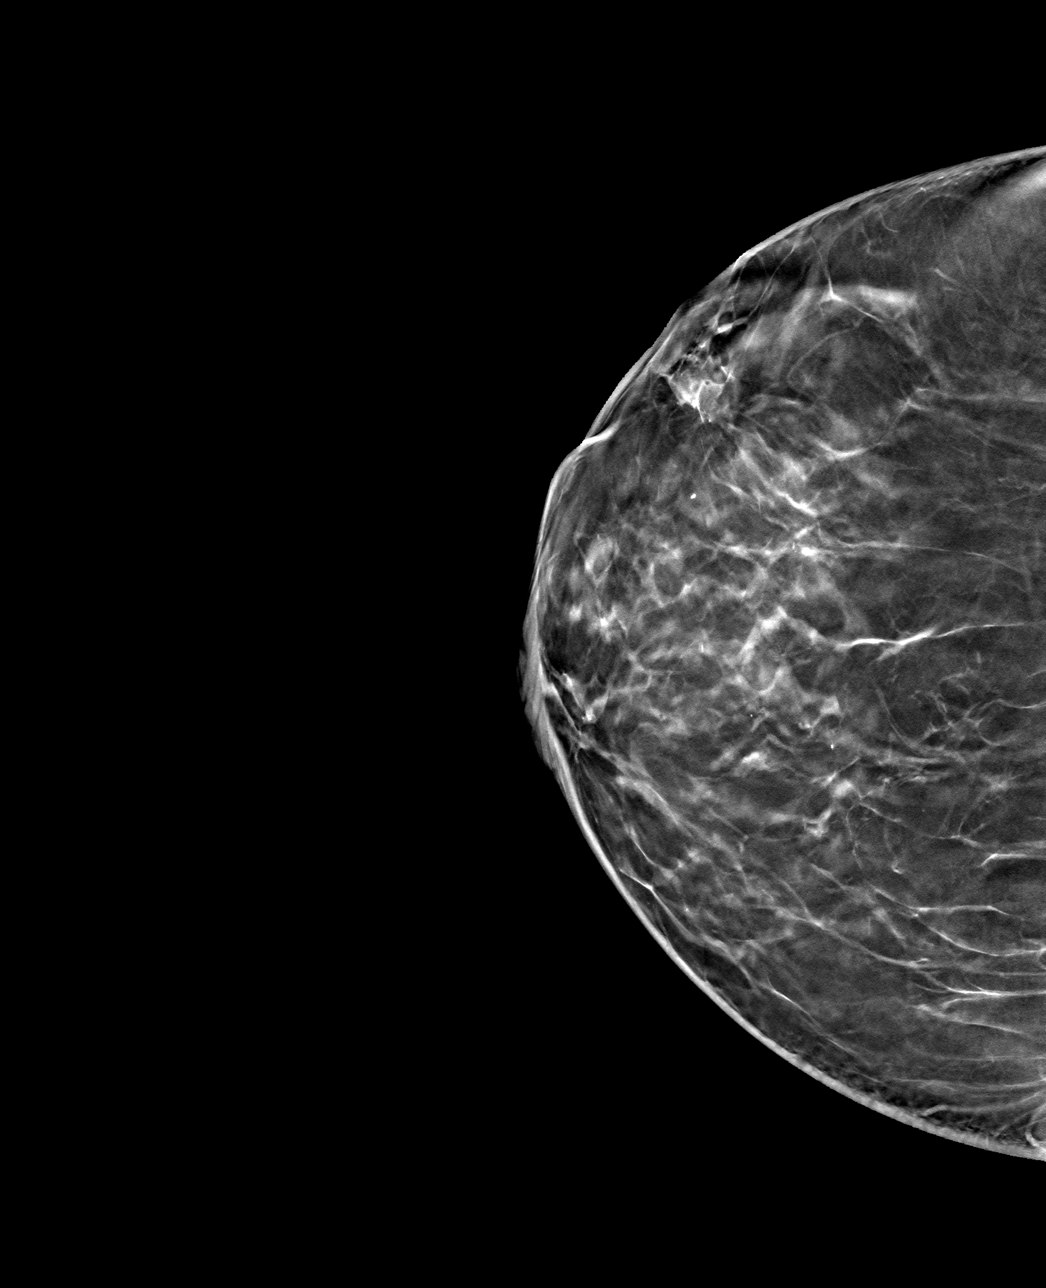

[R ML tomo · tomo slice 41/81.0]
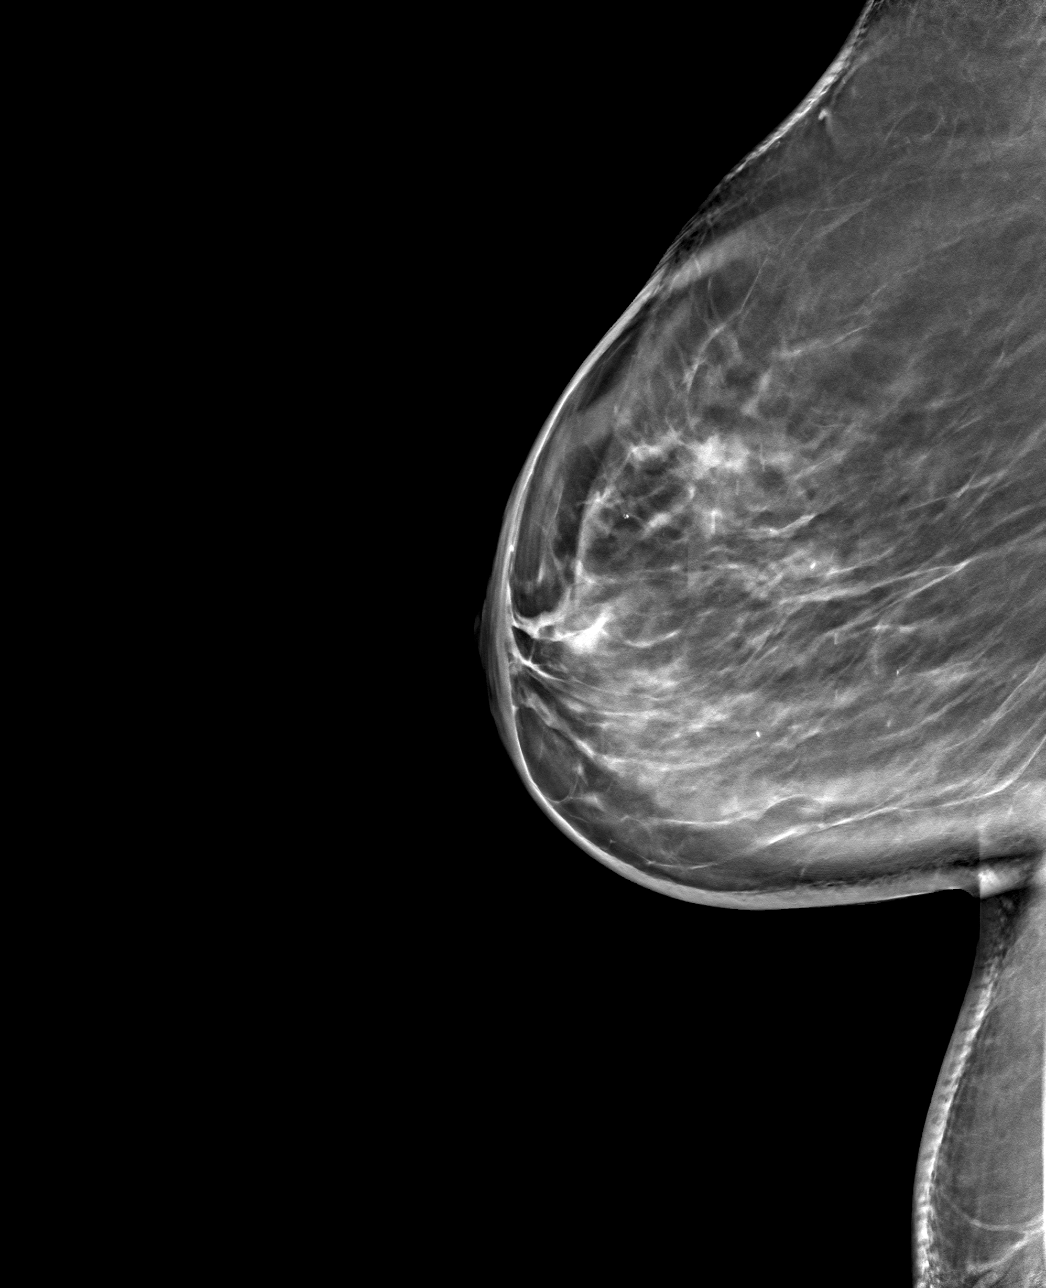

[4 of 12 positions shown; findings below may reference images not displayed]

FINDINGS: 3D Mammographic images were obtained following ultrasound and
stereotactic guided biopsy of the bilateral breast. The biopsy
marking clips are in expected position at the sites of biopsy.
IMPRESSION: 1. Appropriate positioning of the ribbon shaped biopsy marking clip
at the site of biopsy in the upper-outer quadrant of the LEFT breast
corresponding to ULTRASOUND-guided biopsy of a LEFT breast mass at
the 1 o'clock axis.
2. Appropriate positioning of the X shaped biopsy marking clip at
the site of biopsy in the upper outer quadrant of the LEFT breast
corresponding to the targeted calcifications at POSTERIOR aspect.
3. Appropriate positioning of the coil shaped biopsy marking clip at
the site of biopsy in the upper-outer quadrant of the LEFT breast
corresponding to the targeted calcifications at ANTERIOR aspect.
4. Appropriate positioning of the ribbon shaped biopsy marking clip
at the site of biopsy in the upper-outer quadrant of the RIGHT
breast.

Final Assessment: Post Procedure Mammograms for Marker Placement

## 2021-10-09 IMAGING — MG MM BREAST BX W LOC DEV EA AD LESION IMG BX SPEC STEREO GUIDE*L*
5 of 8 series · 5 of 8 positions shown · non-contrast
Comparison: Previous exams.
COMPARISON: Previous exams.

Addendum:
CLINICAL DATA: Patient with highly suspicious calcifications
located within the upper-outer quadrant of the LEFT breast, with a
segmental distribution. Patient presents today for stereotactic
biopsies of the posterior and anterior aspects of these
calcifications.

[R (1 of 5)]
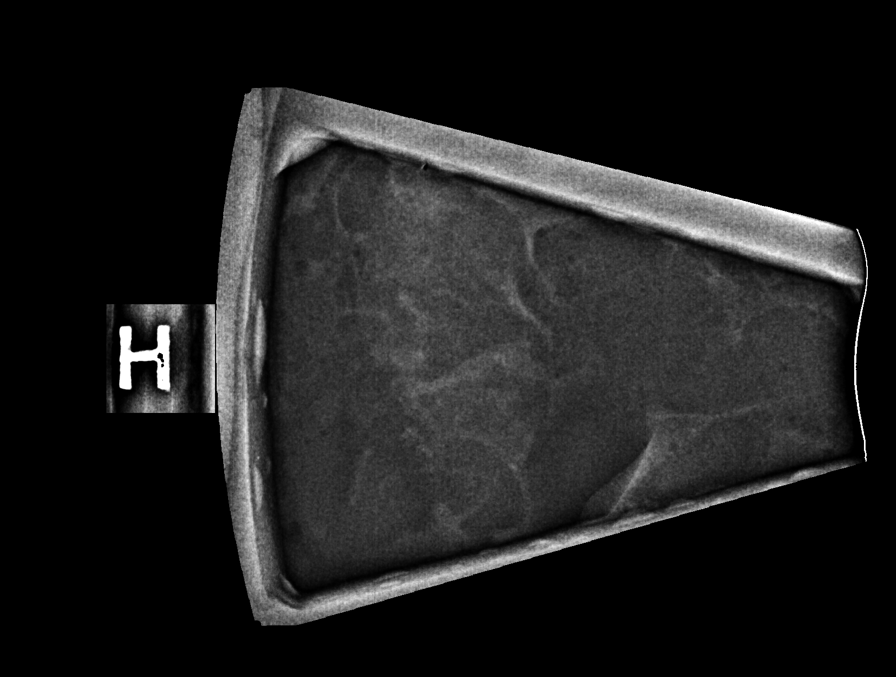

[R (2 of 5)]
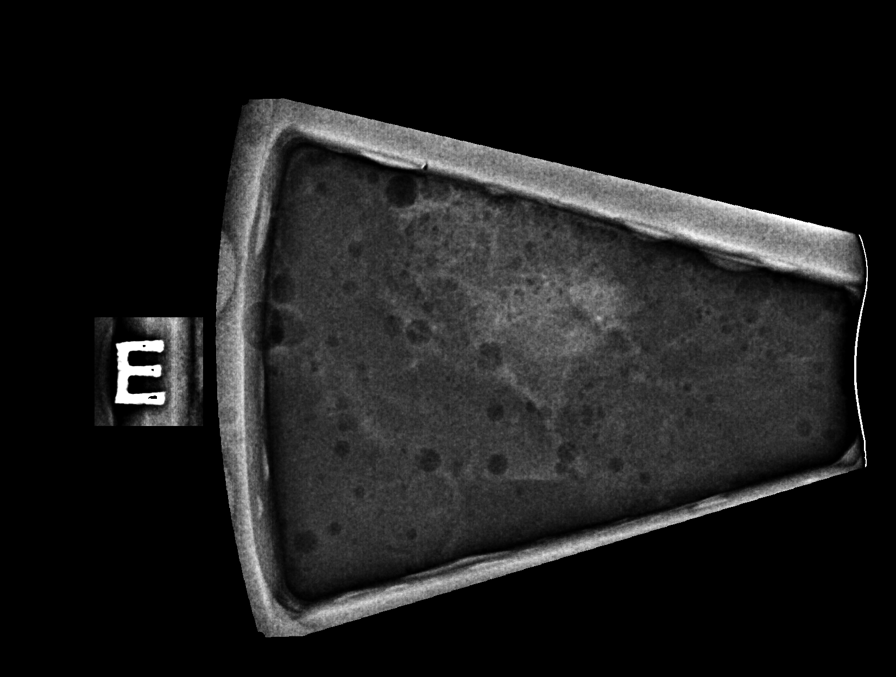

[R (3 of 5)]
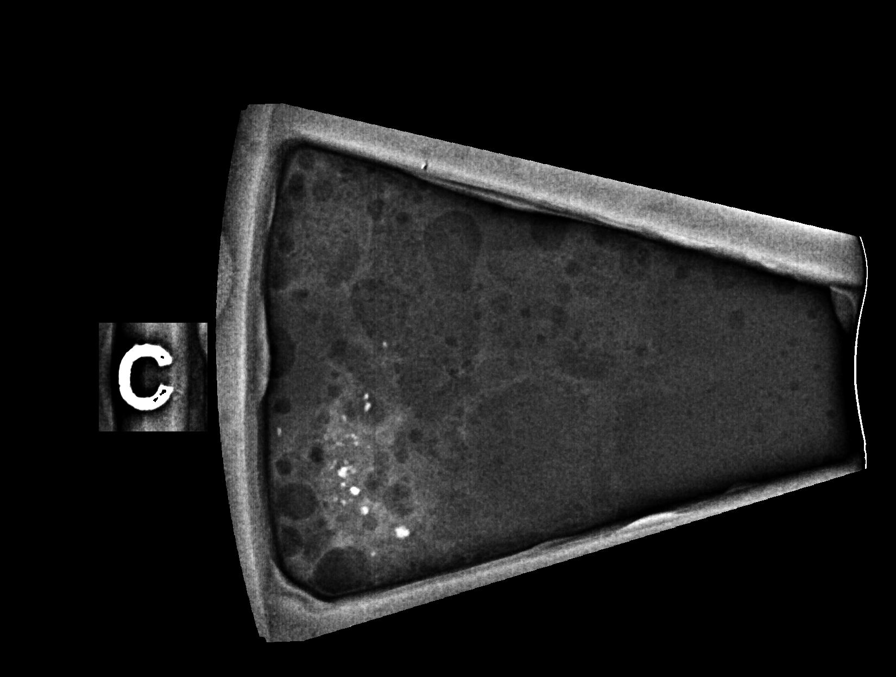

[R (4 of 5)]
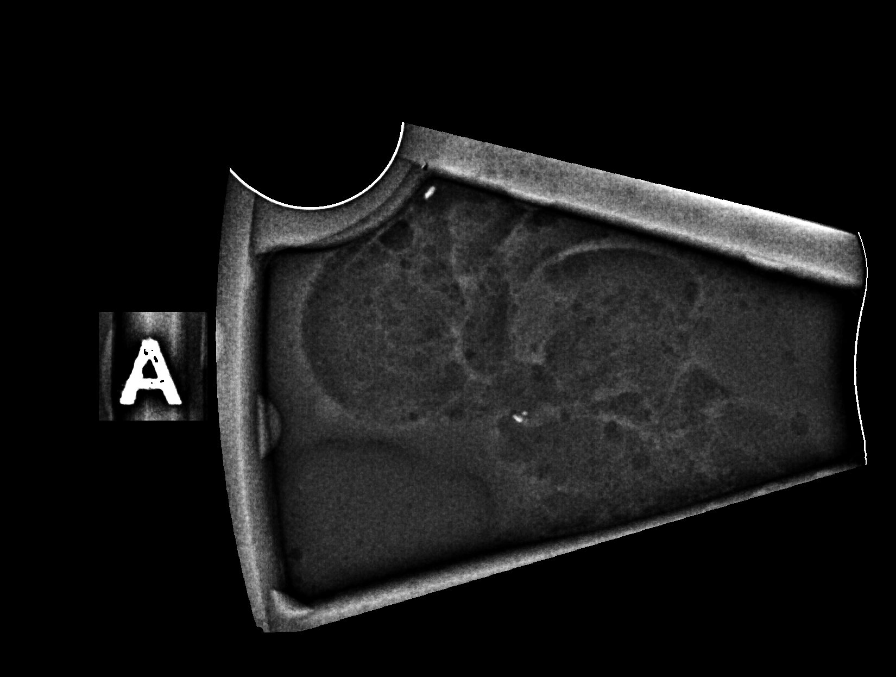

[R (5 of 5)]
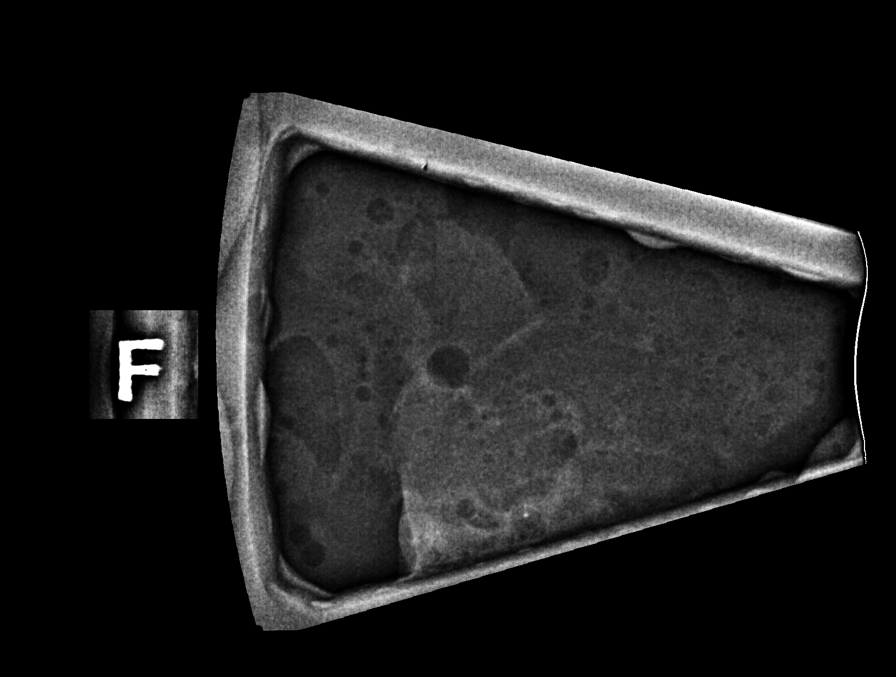

[5 of 8 positions shown; findings below may reference images not displayed]

Patient also with indeterminate calcifications in the RIGHT breast
for which stereotactic biopsy will be performed today.

Ultrasound-guided biopsy of a LEFT breast mass at the 1 o'clock axis
was performed earlier same day with separate biopsy report and
separate pathology orders in [REDACTED].

EXAM:
LEFT BREAST STEREOTACTIC CORE NEEDLE BIOPSY x2

RIGHT BREAST STEREOTACTIC CORE NEEDLE BIOPSY x1
FINDINGS: The patient and I discussed the procedure of stereotactic-guided
biopsy including benefits and alternatives. We discussed the high
likelihood of a successful procedure. We discussed the risks of the
procedure including infection, bleeding, tissue injury, clip
migration, and inadequate sampling. Informed written consent was
given. The usual time out protocol was performed immediately prior
to the procedure.

Site 1 (labeled specimen #2):

Using sterile technique and 1% Lidocaine as local anesthetic, under
stereotactic guidance, a 9 gauge vacuum assisted device was used to
perform core needle biopsy of calcifications in the upper outer
quadrant of the left breast (posterior aspect) using a superior
approach. Specimen radiograph was performed showing calcifications.
Specimens with calcifications are identified for pathology.

Lesion quadrant: Upper outer quadrant

At the conclusion of the procedure, X shaped tissue marker clip was
deployed into the biopsy cavity.

Site 2 (labeled specimen #3):

Using sterile technique and 1% Lidocaine as local anesthetic, under
stereotactic guidance, a 9 gauge vacuum assisted device was used to
perform core needle biopsy of calcifications in the upper outer
quadrant of the left breast (anterior aspect) using a superior
approach. Specimen radiograph was performed showing calcifications.
Specimens with calcifications are identified for pathology.

Lesion quadrant: Upper outer quadrant

At the conclusion of the procedure, coil shaped tissue marker clip
was deployed into the biopsy cavity.

Site 3 (labeled specimen #4):

Using sterile technique and 1% Lidocaine as local anesthetic, under
stereotactic guidance, a 9 gauge vacuum assisted device was used to
perform core needle biopsy of calcifications in the upper outer
quadrant of the RIGHT breast using a superior approach. Specimen
radiograph was performed showing calcifications. Specimens with
calcifications are identified for pathology.

Lesion quadrant: Upper outer quadrant

At the conclusion of the procedure, ribbon shaped tissue marker clip
was deployed into the biopsy cavity.

Follow-up 2-view mammogram was performed and dictated separately.
IMPRESSION: 1. Stereotactic-guided biopsy of calcifications in the upper-outer
quadrant of the LEFT breast (POSTERIOR aspect). Labeled specimen #2
- X clip - no apparent complications.
2. Stereotactic-guided biopsy of calcifications in the upper-outer
quadrant of the LEFT breast (ANTERIOR aspect). Labeled specimen #3 -
Coil clip - no apparent complications.
3. Stereotactic-guided biopsy of calcifications in the upper-outer
quadrant of the RIGHT breast. Labeled specimen #4 Ribbon clip-no
apparent complications.
4. Earlier ultrasound-guided biopsy of a left breast mass at the 1
o'clock axis was labeled specimen #1-with ribbon clip placed at the
biopsy site.

ADDENDUM:
PATHOLOGY revealed: Site 1. BREAST CALCIFICATIONS, LEFT UPPER OUTER
POSTERIOR; STEREOTACTIC BIOPSY (X CLIP): - HIGH-GRADE DUCTAL
CARCINOMA IN SITU (DCIS), COMEDO TYPE WITH ASSOCIATED
CALCIFICATIONS. - FOCUS SUSPICIOUS FOR MICRO INVASIVE CARCINOMA, SEE
COMMENT.

Pathology results are CONCORDANT with imaging findings, per Dr. Olimpia
[place].

PATHOLOGY revealed: Site 2. BREAST CALCIFICATIONS, LEFT UPPER OUTER
ANTERIOR; STEREOTACTIC BIOPSY (COIL CLIP): - HIGH-GRADE DUCTAL
CARCINOMA IN SITU (DCIS), COMEDO TYPE WITH ASSOCIATED
CALCIFICATIONS.

Pathology results are CONCORDANT with imaging findings, per Dr. Olimpia
[place].

PATHOLOGY revealed: Site 3. BREAST CALCIFICATIONS, RIGHT UPPER
OUTER; STEREOTACTIC BIOPSY (RIBBON CLIP): - PREVIOUS SURGICAL SITE
CHANGE WITH ORGANIZING FAT NECROSIS WITH ASSOCIATED CALCIFICATIONS.
- NEGATIVE FOR ATYPIA AND MALIGNANCY.

Pathology results are CONCORDANT with imaging findings, per Dr. Olimpia
[place].

PATHOLOGY revealed: Site 4. BREAST MASS, LEFT [DATE] 6 CM FN;
ULTRASOUND-GUIDED BIOPSY (RIBBON CLIP): - INVASIVE MAMMARY
CARCINOMA, NO SPECIAL TYPE. Size of invasive carcinoma: 3 mm in this
sample Grade 2. Ductal carcinoma in situ: Present, high-grade comedo
type. Lymphovascular invasion: Not identified.

Pathology results are CONCORDANT with imaging findings, per Dr. Olimpia
[place].

Comment: In specimen #1 there is a single 0.07 mm focus that is
suspicious for micro invasive carcinoma. Breast biomarkers will not
be performed on the above specimens and have been ordered on the
concurrent left breast biopsy ([id number]) with invasive mammary
carcinoma.

Pathology results and recommendations below were discussed with
patient by telephone on 07/21/2021. Patient reported biopsy site
within normal limits with slight tenderness at the site. Post biopsy
care instructions were reviewed, questions were answered and my
direct phone number was provided to patient. Patient was instructed
to call [HOSPITAL] if any concerns or questions arise
related to the biopsy.

Recommendations: 1. Surgical consultation for LEFT breast biopsy
sites 1, 2, and 4 only. Request for surgical consultation relayed to
Garik Tolibov RN and Sorin Oxendine RN at [HOSPITAL] [REDACTED] by Alpana Rah RN on 07/21/2021.

2. Consider bilateral breast MRI given high grade DCIS and evaluate
extent of breast disease.

Pathology results reported by Alpana Rah RN on 07/24/2021.

*** End of Addendum ***
Patient also with indeterminate calcifications in the RIGHT breast
for which stereotactic biopsy will be performed today.

Ultrasound-guided biopsy of a LEFT breast mass at the 1 o'clock axis
was performed earlier same day with separate biopsy report and
separate pathology orders in [REDACTED].

EXAM:
LEFT BREAST STEREOTACTIC CORE NEEDLE BIOPSY x2

RIGHT BREAST STEREOTACTIC CORE NEEDLE BIOPSY x1
FINDINGS: The patient and I discussed the procedure of stereotactic-guided
biopsy including benefits and alternatives. We discussed the high
likelihood of a successful procedure. We discussed the risks of the
procedure including infection, bleeding, tissue injury, clip
migration, and inadequate sampling. Informed written consent was
given. The usual time out protocol was performed immediately prior
to the procedure.

Site 1 (labeled specimen #2):

Using sterile technique and 1% Lidocaine as local anesthetic, under
stereotactic guidance, a 9 gauge vacuum assisted device was used to
perform core needle biopsy of calcifications in the upper outer
quadrant of the left breast (posterior aspect) using a superior
approach. Specimen radiograph was performed showing calcifications.
Specimens with calcifications are identified for pathology.

Lesion quadrant: Upper outer quadrant

At the conclusion of the procedure, X shaped tissue marker clip was
deployed into the biopsy cavity.

Site 2 (labeled specimen #3):

Using sterile technique and 1% Lidocaine as local anesthetic, under
stereotactic guidance, a 9 gauge vacuum assisted device was used to
perform core needle biopsy of calcifications in the upper outer
quadrant of the left breast (anterior aspect) using a superior
approach. Specimen radiograph was performed showing calcifications.
Specimens with calcifications are identified for pathology.

Lesion quadrant: Upper outer quadrant

At the conclusion of the procedure, coil shaped tissue marker clip
was deployed into the biopsy cavity.

Site 3 (labeled specimen #4):

Using sterile technique and 1% Lidocaine as local anesthetic, under
stereotactic guidance, a 9 gauge vacuum assisted device was used to
perform core needle biopsy of calcifications in the upper outer
quadrant of the RIGHT breast using a superior approach. Specimen
radiograph was performed showing calcifications. Specimens with
calcifications are identified for pathology.

Lesion quadrant: Upper outer quadrant

At the conclusion of the procedure, ribbon shaped tissue marker clip
was deployed into the biopsy cavity.

Follow-up 2-view mammogram was performed and dictated separately.
IMPRESSION: 1. Stereotactic-guided biopsy of calcifications in the upper-outer
quadrant of the LEFT breast (POSTERIOR aspect). Labeled specimen #2
- X clip - no apparent complications.
2. Stereotactic-guided biopsy of calcifications in the upper-outer
quadrant of the LEFT breast (ANTERIOR aspect). Labeled specimen #3 -
Coil clip - no apparent complications.
3. Stereotactic-guided biopsy of calcifications in the upper-outer
quadrant of the RIGHT breast. Labeled specimen #4 Ribbon clip-no
apparent complications.
4. Earlier ultrasound-guided biopsy of a left breast mass at the 1
o'clock axis was labeled specimen #1-with ribbon clip placed at the
biopsy site.

## 2021-10-12 ENCOUNTER — Other Ambulatory Visit: Payer: 59

## 2021-10-12 ENCOUNTER — Ambulatory Visit: Payer: 59 | Admitting: Oncology

## 2021-10-13 ENCOUNTER — Ambulatory Visit: Payer: 59

## 2021-10-16 ENCOUNTER — Ambulatory Visit: Payer: 59

## 2021-10-18 ENCOUNTER — Ambulatory Visit: Payer: 59 | Attending: Family Medicine | Admitting: Occupational Therapy

## 2021-10-18 DIAGNOSIS — M25612 Stiffness of left shoulder, not elsewhere classified: Secondary | ICD-10-CM | POA: Insufficient documentation

## 2021-10-18 DIAGNOSIS — L905 Scar conditions and fibrosis of skin: Secondary | ICD-10-CM | POA: Insufficient documentation

## 2021-10-18 NOTE — Therapy (Signed)
[facility] [address], [place], [address] Phone: [phone]   Fax:  [phone]  Occupational Therapy Screen  Patient Details  Name: Emily Tapia MRN: [id number] Date of Birth: 03/20/1961 No data recorded  Encounter Date: 10/18/2021   OT End of Session - 10/18/21 1700     Visit Number 0             Past Medical History:  Diagnosis Date   Anxiety    Aortic stenosis    Diabetes mellitus    Edema    Heart murmur    Hypertension    Sleep apnea    Thyroid disease     Past Surgical History:  Procedure Laterality Date   ABDOMINAL HYSTERECTOMY  11/17/2013   BREAST BIOPSY     x2   BREAST BIOPSY Left 07/20/2021   [place] Bx, Ribbon Clip, Path pending   BREAST BIOPSY Left 07/20/2021   Stereo Bx, X-clip, path pending   BREAST BIOPSY Left 07/20/2021   Stereo biopsy, coil clip, path pending   BREAST BIOPSY Right 07/20/2021   Stereo Bx, Ribbon Clip, path pending   CESAREAN SECTION     x 2   CHOLECYSTECTOMY  12/17/2001   DUE TO STONES   DILATION AND CURETTAGE OF UTERUS     ELBOW SURGERY     POLYPECTOMY  12/17/1993   SIMPLE MASTECTOMY WITH AXILLARY SENTINEL NODE BIOPSY Left 08/16/2021   Procedure: SIMPLE MASTECTOMY WITH AXILLARY SENTINEL NODE BIOPSY;  Surgeon: Ronny Bacon, MD;  Location: [facility] ORS;  Service: General;  Laterality: Left;    There were no vitals filed for this visit.   Subjective Assessment - 10/18/21 1657     Subjective  I don't know when my wound will be close so I can start chemo - its been 9 wks - but the surgeon said we can do over head motion    Currently in Pain? Yes    Pain Score 7     Pain Location Arm    Pain Orientation Left    Pain Descriptors / Indicators Aching;Tightness;Tender    Pain Type Surgical pain                 LYMPHEDEMA/ONCOLOGY QUESTIONNAIRE - 10/18/21 0001       Right Upper Extremity Lymphedema   15 cm Proximal to Olecranon  Process 33.5 cm    10 cm Proximal to Olecranon Process 31 cm    Olecranon Process 24.5 cm    15 cm Proximal to Ulnar Styloid Process 24 cm    10 cm Proximal to Ulnar Styloid Process 19.5 cm      Left Upper Extremity Lymphedema   15 cm Proximal to Olecranon Process 33.4 cm    10 cm Proximal to Olecranon Process 30 cm    Olecranon Process 24 cm    15 cm Proximal to Ulnar Styloid Process 23.5 cm    10 cm Proximal to Ulnar Styloid Process 19.8 cm                  Dr Emily Tapia appt 09/13/21 NOTE:   Physical Exam: Constitutional: She appears well, nontoxic at all today. Left mastectomy site: Her drainage continues at 160 to 170 mL/day, it is not as red-tinged, nor as purulent with more of a dilute seropurulent character.  She remarkably has no induration no remarkable tenderness to the axilla or lateral chest wall, it is difficult to assess where this drainage  is coming from. I can palpate the tip of the Blake drain medial to the eschar, it seems to be tucked away well within the inferior flap, and does not appear to communicate with a spot at the junction of the eschar and the incision where I have expressed some drainage from.  I prepped this area with Betadine, with an 11 blade encouraged a little opening through which I could more aggressively drain fluid from under the eschar.  It somewhat oily consistent with some subcutaneous tissue breakdown.  I feel that I was able to get all of this fluid that apparently does not communicate with the drain.     Assessment/Plan: This is a 60 y.o. female 4 weeks s/p left mastectomy with sentinel lymph node biopsy.  Now with cellulitis abscess of left breast seroma postoperative seroma, currently draining, with deterioration of aspect of medial most skin flaps.  I have drained these without removing the eschar, hoping to continue the suction drain laterally.  I suspect there will be a point where I have to abandon the suction, and go to open drainage.   In the meantime we will continue Silvadene to the eschar, continuing to anticipate further demarcation.  I will renew her Augmentin and lieu of refining her antibiotic coverage due to the lack of a culture and sensitivity report.       OT SCREEN 09/13/21: Pt arrive with husband - still drain in and on antibiotics. R hand dominant - and follow up appt with surgeon next week. Swelling under L axilla and drain in place, and bandage. Pt show decrease shoulder over head flexion, ABD and ext rotation - did not do any exercises or ROM because of drain. Pt report was told today that she will have probably radiation in future. Pt was ed on lymphedema signs and symptoms ,prevention and treatment. Hand out provided. Base measurements taken this date - see circumference. WNL at this time Pt to follow up with me again in 3 wks    09/26/21 DR Emily Tapia NOTE:     Emily Tapia returns today, she is noted more serous soupy drainage from her eschar.  She otherwise looks great, her drain site is healing well and is clean.  There is no areas of fluctuance consistent with residual seroma.  She does note some twinges of tightness feeling in her left pectoral deltoid region, likely due to not working on her range of motion.   The eschar of the medial most aspect of her left chest wall incision from her mastectomy is a very well demarcated, and we will proceed with excision of the eschar today and initiate open wound care.   With informed consent we placed her in a supine position.  I prepped the area with Betadine and we used a fenestrated drape.  With sterile technique I proceeded with sharp excision of the eschar which was markedly undergone liquefactive necrosis in 1 area, and very dry over the lower perimeter.  I was able to define a good line of division excising away of this necrotic tissue from the adjacent viable tissue.  She tolerated this very well without any additional local anesthetic.  Hemostasis was also  easily controlled.  Is allowed drainage of the underlying soft tissues.  I marked out this cavity with dry gauze, and subsequently packed it moistened it with normal saline solution.   We gave instructions for local wound care to be done once to twice daily as needed.  I believe she will initiate  saline dressing changes and follow-up in a week to 10 days to evaluate the progress of her wound.   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-  She arrive this date with some reports of pull from her armpit into her elbow and forearm - appear pt have some cording - release  it in axilla with less pull in upper arm but still continues into anterior elbow to forearm - but unable to release further - painfull - will check on it again in 2 wks Pt's L upper arm circumference increase by 1 cm this date compare to L - will monitor- appt with surgeon on 20th check up on would care- Pt can do some below 90 degrees for shoulder and elbow AROM glides -and ask surgeon if can do supine - over head AAROM shoulder flexion and ABD  Follow up in 2 wks again   OT SCREEN 09/17/21: Pt report she is working 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 - desk little high - and feet on foot stool already Wound still not closed on L chest - keeping bandage on it and cleaning it Report this date still pull inside of arm from elbow into forearm - upper arm better - pt with cording - release  it in axilla  last time with less pull in upper arm but still continues into anterior elbow to forearm - this date did get on release at elbow - will check on it again in 2 wks Pt's L upper arm circumference  compare to R UE WNL - will monitor- appt with surgeon  tomorrow again  Did get verbal  order from surgeon to do over head ROM -  Initiated this date shoulder flexion and horizontal ABD in supine using golf club - as well as ext rotation over head in supine   12 reps - add to HEP  As well as massage by husband L upper traps -and do cervical lateral flexion to the R - 10 reps Shoulder flexion 135 - and ABD compensate into some flexion- pain with ext rotation to back of head and int rotation lower back  Follow up in 2 wks again                                     Patient will benefit from skilled therapeutic intervention in order to improve the following deficits and impairments:           Visit Diagnosis: Scar condition and fibrosis of skin  Stiffness of left shoulder, not elsewhere classified    Problem List Patient Active Problem List   Diagnosis Date Noted   Infected surgical wound 09/07/2021   Carcinoma of left breast ([place]) 08/30/2021   Status post left mastectomy 08/22/2021   Other peripheral vertigo, unspecified ear 01/02/2020   Pelvic mass 07/08/2015   Hyperlipidemia, mixed 07/08/2015   Hx of iron deficiency 07/08/2015   Absolute anemia 04/27/2015   Diabetic peripheral neuropathy associated with type 2 diabetes mellitus ([place]) 04/27/2015   Breath shortness 04/27/2015   Accumulation of fluid in tissues 04/27/2015   Cardiac murmur 04/27/2015  HLD (hyperlipidemia) 04/27/2015   Decreased potassium in the blood 04/27/2015   Hypothyroidism 04/27/2015   Obstructive sleep apnea 04/27/2015   Disorder of peripheral nervous system 04/27/2015   Obstructive apnea 04/27/2015   Pain in shoulder 04/27/2015   Diabetes mellitus, type 2 ([place]) 04/27/2015   Fibroid uterus 10/25/2013   Aortic valve stenosis 05/01/2011   Edema 03/27/2011   SOB (shortness of breath) 03/27/2011   Diabetes mellitus ([place]) 03/27/2011   HTN (hypertension) 03/27/2011   Essential (primary) hypertension 07/05/2006   Anxiety, generalized 07/05/2006    Rosalyn Gess, OTR/L,CLT 10/18/2021, 5:03 PM  [facility] [address], [place], [address] Phone: [phone]   Fax:  [phone]  Name: Emily Tapia MRN: [id number] Date of Birth: 09-21-1961

## 2021-10-19 ENCOUNTER — Other Ambulatory Visit: Payer: Self-pay

## 2021-10-19 ENCOUNTER — Encounter: Payer: Self-pay | Admitting: Surgery

## 2021-10-19 ENCOUNTER — Ambulatory Visit (INDEPENDENT_AMBULATORY_CARE_PROVIDER_SITE_OTHER): Payer: 59 | Admitting: Surgery

## 2021-10-19 VITALS — BP 160/90 | HR 71 | Temp 98.2°F | Wt 180.0 lb

## 2021-10-19 DIAGNOSIS — C50212 Malignant neoplasm of upper-inner quadrant of left female breast: Secondary | ICD-10-CM

## 2021-10-19 DIAGNOSIS — Z09 Encounter for follow-up examination after completed treatment for conditions other than malignant neoplasm: Secondary | ICD-10-CM

## 2021-10-19 DIAGNOSIS — Z170 Estrogen receptor positive status [ER+]: Secondary | ICD-10-CM

## 2021-10-19 NOTE — Progress Notes (Signed)
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    INTERVAL HISTORY: Patient returns to clinic today for further evaluation and initiation of cycle 1 of Taxotere, carboplatinum, Herceptin, and Perjeta.  She continues to have an open area at the site of her breast surgery, but otherwise is healing up well.  Patient had port placed yesterday.  She currently feels well and is asymptomatic. She has no neurologic complaints.  She denies any recent fevers or illnesses.  She has a good appetite and denies weight loss.  She has no chest pain, shortness of breath, cough, or hemoptysis.  She denies any nausea, vomiting, constipation, or diarrhea.  She has no urinary complaints.  Patient offers no further specific complaints today.  REVIEW OF SYSTEMS:   Review of Systems  Constitutional: Negative.  Negative for fever, malaise/fatigue and weight loss.  Respiratory: Negative.  Negative for cough and shortness of breath.   Cardiovascular: Negative.  Negative for chest pain and leg swelling.  Gastrointestinal: Negative.  Negative for abdominal pain.  Genitourinary: Negative.  Negative for dysuria.  Musculoskeletal: Negative.  Negative for back pain.  Skin: Negative.  Negative for rash.  Neurological: Negative.  Negative for dizziness, focal weakness, weakness and headaches.  Psychiatric/Behavioral: Negative.  The patient is not nervous/anxious.    As per HPI. Otherwise, a complete review of systems is negative.  PAST MEDICAL HISTORY: Past Medical History:  Diagnosis Date   Anxiety    Aortic stenosis    Diabetes mellitus     Edema    Heart murmur    Hypertension    Sleep apnea    Thyroid disease     PAST SURGICAL HISTORY: Past Surgical History:  Procedure Laterality Date   ABDOMINAL HYSTERECTOMY  11/17/2013   BREAST BIOPSY     x2   BREAST BIOPSY Left 07/20/2021   [place] Bx, Ribbon Clip, Path pending   BREAST BIOPSY Left 07/20/2021   Stereo Bx, X-clip, path pending   BREAST BIOPSY Left 07/20/2021   Stereo biopsy, coil clip, path pending   BREAST BIOPSY Right 07/20/2021   Stereo Bx, Ribbon Clip, path pending   CESAREAN SECTION     x 2   CHOLECYSTECTOMY  12/17/2001   DUE TO STONES   DILATION AND CURETTAGE OF UTERUS     ELBOW SURGERY     POLYPECTOMY  12/17/1993   PORTACATH PLACEMENT Right 10/25/2021   Procedure: INSERTION PORT-A-CATH;  Surgeon: Ronny Bacon, MD;  Location: [facility] ORS;  Service: General;  Laterality: Right;   SIMPLE MASTECTOMY WITH AXILLARY SENTINEL NODE BIOPSY Left 08/16/2021   Procedure: SIMPLE MASTECTOMY WITH AXILLARY SENTINEL NODE BIOPSY;  Surgeon: Ronny Bacon, MD;  Location: [facility] ORS;  Service: General;  Laterality: Left;    FAMILY HISTORY: Family History  Problem Relation Age of Onset   Hyperlipidemia Mother    Thyroid disease Mother    Hashimoto's thyroiditis Mother    Heart disease Father        open heart surgery   Heart failure Father    Diabetes Father    Dementia Father    Thyroid disease Brother    Breast cancer Maternal Grandmother    Diabetes  Paternal Grandmother    Prostate cancer Paternal Grandfather     ADVANCED DIRECTIVES (Y/N):  N  HEALTH MAINTENANCE: Social History   Tobacco Use   Smoking status: Former    Packs/day: 1.50    Years: 25.00    Pack years: 37.50    Types: Cigarettes    Quit date: 11/24/2001    Years since quitting: 19.9   Smokeless tobacco: Never  Vaping Use   Vaping Use: Never used  Substance Use Topics   Alcohol use: Yes    Alcohol/week: 0.0 standard drinks    Comment: OCCASIONALLY DRINKS WINE, ONCE A WEEK   Drug  use: No     Colonoscopy:  PAP:  Bone density:  Lipid panel:  No Known Allergies  Current Outpatient Medications  Medication Sig Dispense Refill   Ascorbic Acid (VITAMIN C PO) Take 1 tablet by mouth daily.     aspirin 81 MG EC tablet Take 81 mg by mouth at bedtime.     carvedilol (COREG) 25 MG tablet Take 1 tablet (25 mg total) by mouth 2 (two) times daily. 180 tablet 1   Cholecalciferol (VITAMIN D3) 1000 UNITS CAPS Take 1,000 Units by mouth in the morning.     Dulaglutide (TRULICITY) 3 VO/5.3GU SOPN Inject 3 mg as directed once a week. 2 mL 3   Ferrous Sulfate (IRON) 325 (65 Fe) MG TABS Take 1 tablet by mouth once daily with breakfast 90 tablet 0   furosemide (LASIX) 40 MG tablet TAKE 1 TABLET BY MOUTH ONCE DAILY AS NEEDED FOR FLUID OR EDEMA 90 tablet 1   glucose blood (CONTOUR NEXT TEST) test strip Test fasting sugar each morning. Recheck if having hypoglycemic symptoms. 100 each 4   glyBURIDE (DIABETA) 5 MG tablet TAKE 1 TABLET BY MOUTH TWICE DAILY WITH MEALS. 90 tablet 1   hydrochlorothiazide (HYDRODIURIL) 25 MG tablet Take 1 tablet (25 mg total) by mouth daily. 90 tablet 1   ibuprofen (ADVIL) 800 MG tablet Take 1 tablet (800 mg total) by mouth every 8 (eight) hours as needed. 30 tablet 0   levothyroxine (EUTHYROX) 75 MCG tablet TAKE 1 TABLET BY MOUTH ONCE DAILY BEFORE BREAKFAST . 90 tablet 1   lisinopril (ZESTRIL) 40 MG tablet Take 1 tablet (40 mg total) by mouth daily. 90 tablet 1   metFORMIN (GLUCOPHAGE) 1000 MG tablet Take 1 tablet (1,000 mg total) by mouth 2 (two) times daily with a meal. 180 tablet 1   Multiple Vitamin (MULTIVITAMIN) tablet Take 1 tablet by mouth every morning.     PARoxetine (PAXIL) 20 MG tablet Take 1 tablet (20 mg total) by mouth daily. 90 tablet 1   zinc gluconate 50 MG tablet Take 50 mg by mouth daily.     HYDROcodone-acetaminophen (NORCO/VICODIN) 5-325 MG tablet Take 1 tablet by mouth every 6 (six) hours as needed for moderate pain. (Patient not taking:  Reported on 10/26/2021) 15 tablet 0   lidocaine-prilocaine (EMLA) cream Apply to affected area once (Patient not taking: No sig reported) 30 g 3   ondansetron (ZOFRAN) 8 MG tablet Take 1 tablet (8 mg total) by mouth 2 (two) times daily as needed for refractory nausea / vomiting. (Patient not taking: No sig reported) 60 tablet 2   prochlorperazine (COMPAZINE) 10 MG tablet Take 1 tablet (10 mg total) by mouth every 6 (six) hours as needed (Nausea or vomiting). (Patient not taking: No sig reported) 60 tablet 2   No current facility-administered medications for this visit.    OBJECTIVE:  Vitals:   10/26/21 1001  BP: (!) 164/72  Pulse: 69  Resp: 18  Temp: 98.1 F (36.7 C)  SpO2: 98%     Body mass index is 31.98 kg/m.    ECOG FS:0 - Asymptomatic  General: Well-developed, well-nourished, no acute distress. Eyes: Pink conjunctiva, anicteric sclera. HEENT: Normocephalic, moist mucous membranes. Breast: Surgical dressing, CDI, on left chest wall Lungs: No audible wheezing or coughing. Heart: Regular rate and rhythm. Abdomen: Soft, nontender, no obvious distention. Musculoskeletal: No edema, cyanosis, or clubbing. Neuro: Alert, answering all questions appropriately. Cranial nerves grossly intact. Skin: No rashes or petechiae noted. Psych: Normal affect.    LAB RESULTS:  Lab Results  Component Value Date   NA 137 10/26/2021   K 3.8 10/26/2021   CL 95 (L) 10/26/2021   CO2 28 10/26/2021   GLUCOSE 225 (H) 10/26/2021   BUN 17 10/26/2021   CREATININE 0.71 10/26/2021   CALCIUM 9.0 10/26/2021   PROT 7.5 10/26/2021   ALBUMIN 4.5 10/26/2021   AST 26 10/26/2021   ALT 33 10/26/2021   ALKPHOS 54 10/26/2021   BILITOT 0.5 10/26/2021   GFRNONAA >60 10/26/2021   GFRAA 111 10/14/2020    Lab Results  Component Value Date   WBC 10.3 10/26/2021   NEUTROABS 7.9 (H) 10/26/2021   HGB 11.8 (L) 10/26/2021   HCT 36.4 10/26/2021   MCV 80.2 10/26/2021   PLT 130 (L) 10/26/2021      STUDIES: DG Chest Port 1 View  Result Date: 10/25/2021 CLINICAL DATA:  Central venous catheter placement EXAM: PORTABLE CHEST 1 VIEW COMPARISON:  01/02/2020 FINDINGS: A power injectable right IJ Port-A-Cath is noted with its tip projecting over the SVC. No pneumothorax or complicating feature. Small clips project over the left axilla. The lungs appear clear. Heart size within normal limits for projection. Atherosclerotic calcification of the aortic arch. IMPRESSION: 1. Right IJ power injectable Port-A-Cath tip: SVC. No pneumothorax or complicating feature identified. 2.  Aortic Atherosclerosis (ICD10-I70.0). Electronically Signed   By: Van Clines M.D.   On: 10/25/2021 13:53   DG C-Arm 1-60 Min-No Report  Result Date: 10/25/2021 Fluoroscopy was utilized by the requesting physician.  No radiographic interpretation.    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  Previously, right breast biopsy was negative for malignancy.  Given stage and HER2 status of disease, patient will require adjuvant treatment using Taxotere, carboplatinum, Perjeta, and Herceptin every 3 weeks for 6 cycles with Udenyca support and then maintenance Herceptin and Perjeta every 3 weeks for an entire year.  She will also benefit from letrozole for 5 years at the conclusion of all of her treatments.  MUGA scan from September 21, 2021 revealed a EF of 69.3%.  Patient has had port placement.  Return to clinic tomorrow for cycle 1 of treatment.  Patient will then return to clinic in 1 week for laboratory work and further evaluation and then in 3 weeks for laboratory work and evaluation on day 1 and cycle 2 of chemotherapy on day 2.   Mastectomy wound: Continue  follow-up with surgery as scheduled.  Proceed cautiously with treatment as above.  I spent a total of 30 minutes reviewing chart data, face-to-face evaluation with the patient, counseling and  coordination of care as detailed above.   Patient expressed understanding and was in agreement with this plan. She also understands that She can call clinic at any time with any questions, concerns, or complaints.   Cancer Staging Carcinoma of left breast [facility]) Staging form: Breast, AJCC 8th Edition - Pathologic stage from 08/30/2021: Stage IB (pT2, pN1a, cM0, G3, ER+, PR+, HER2+, Oncotype DX score: 18) - Signed by Lloyd Huger, MD on 09/13/2021 Stage prefix: Initial diagnosis Multigene prognostic tests performed: Oncotype DX Recurrence score range: Greater than or equal to 11 Histologic grading system: 3 grade system  Lloyd Huger, MD   10/26/2021 10:43 AM

## 2021-10-19 NOTE — Patient Instructions (Addendum)
Keep the area as dry as possible. When the steri strips start to come off pack what areas you are able to. Keep a top dry dressing over the area.  We will see about getting you schedule for a port placement. Our surgery scheduler will call you about this.  Follow up here in 2 weeks.   Implanted Port Insertion Implanted port insertion is a procedure to put in a port and catheter. The port is a device with an injectable disk that can be accessed by your health care provider. The port is connected to a vein in the chest or neck by a small flexible tube (catheter). There are different types of ports. The implanted port may be used as a long-term IV access for: Medicines, such as chemotherapy. Fluids. Liquid nutrition, such as total parenteral nutrition (TPN). When you have a port, your health care provider can choose to use the port instead of veins in your arms for these procedures. Tell a health care provider about: Any allergies you have. All medicines you are taking, especially blood thinners, as well as any vitamins, herbs, eye drops, creams, over-the-counter medicines, and steroids. Any problems you or family members have had with anesthetic medicines. Any blood disorders you have. Any surgeries you have had. Any medical conditions you have or have had, including diabetes or kidney problems. Whether you are pregnant or may be pregnant. What are the risks? Generally, this is a safe procedure. However, problems may occur, including: Allergic reactions to medicines or dyes. Damage to other structures or organs. Infection. Damage to the blood vessel, bruising, or bleeding at the puncture site. Blood clot. Breakdown of the skin over the port. A collection of air in the chest that can cause one of the lungs to collapse (pneumothorax). This is rare. What happens before the procedure? Medicines Ask your health care provider about: Changing or stopping your regular medicines. This is  especially important if you are taking diabetes medicines or blood thinners. Taking medicines such as aspirin and ibuprofen. These medicines can thin your blood. Do not take these medicines unless your health care provider tells you to take them. Taking over-the-counter medicines, vitamins, herbs, and supplements. General instructions Plan to have someone take you home from the hospital or clinic. If you will be going home right after the procedure, plan to have someone with you for 24 hours. You may have blood tests. Do not use any products that contain nicotine or tobacco for at least 4-6 weeks before the procedure. These products include cigarettes, e-cigarettes, and chewing tobacco. If you need help quitting, ask your health care provider. Ask your health care provider what steps will be taken to help prevent infection. These may include: Removing hair at the surgery site. Washing skin with a germ-killing soap. Taking antibiotic medicine. What happens during the procedure?  An IV will be inserted into one of your veins. You will be given one or more of the following: A medicine to help you relax (sedative). A medicine to numb the area (local anesthetic). Two small incisions will be made to insert the port. One smaller incision will be made in your neck to get access to the vein where the catheter will lie. The other incision will be made in the upper chest. This is where the port will lie. The procedure may be done using continuous X-ray (fluoroscopy) or other imaging tools for guidance. The port and catheter will be placed. There may be a small, raised area where the  port is. The port will be flushed with a salt solution (saline), and blood will be drawn to make sure that it is working correctly. The incisions will be closed. Bandages (dressings) may be placed over the incisions. The procedure may vary among health care providers and hospitals. What happens after the procedure? Your  blood pressure, heart rate, breathing rate, and blood oxygen level will be monitored until you leave the hospital or clinic. Do not drive for 24 hours if you were given a sedative during your procedure. You will be given a manufacturer's information card for the type of port that you have. Keep this with you. Your port will need to be flushed and checked as told by your health care provider, usually every few weeks. A chest X-ray will be done to: Check the placement of the port. Make sure there is no injury to your lung. Summary Implanted port insertion is a procedure to put in a port and catheter. The implanted port is used as a long-term IV access. The port will need to be flushed and checked as told by your health care provider, usually every few weeks. Keep your manufacturer's information card with you at all times. This information is not intended to replace advice given to you by your health care provider. Make sure you discuss any questions you have with your health care provider. Document Revised: 02/22/2021 Document Reviewed: 07/01/2018 Elsevier Patient Education  [place].

## 2021-10-20 NOTE — Progress Notes (Signed)
Pharmacist Chemotherapy Monitoring - Initial Assessment    Anticipated start date: 10/20/21   The following has been reviewed per standard work regarding the patient's treatment regimen: The patient's diagnosis, treatment plan and drug doses, and organ/hematologic function Lab orders and baseline tests specific to treatment regimen  The treatment plan start date, drug sequencing, and pre-medications Prior authorization status  Patient's documented medication list, including drug-drug interaction screen and prescriptions for anti-emetics and supportive care specific to the treatment regimen The drug concentrations, fluid compatibility, administration routes, and timing of the medications to be used The patient's access for treatment and lifetime cumulative dose history, if applicable  The patient's medication allergies and previous infusion related reactions, if applicable   Changes made to treatment plan:  treatment plan date  Follow up needed:  dose adjustment of carbo due to dosing and signing treatment plan   Adelina Mings, [place], 10/20/2021  2:10 PM

## 2021-10-22 ENCOUNTER — Other Ambulatory Visit: Payer: Self-pay | Admitting: Physician Assistant

## 2021-10-23 ENCOUNTER — Telehealth: Payer: Self-pay | Admitting: Oncology

## 2021-10-23 ENCOUNTER — Ambulatory Visit: Payer: Self-pay | Admitting: Surgery

## 2021-10-23 DIAGNOSIS — C50212 Malignant neoplasm of upper-inner quadrant of left female breast: Secondary | ICD-10-CM | POA: Insufficient documentation

## 2021-10-23 DIAGNOSIS — Z170 Estrogen receptor positive status [ER+]: Secondary | ICD-10-CM | POA: Insufficient documentation

## 2021-10-23 NOTE — H&P (View-Only) (Signed)
Patient ID: Emily Tapia, female   DOB: [birth year], 60 y.o.   MRN: [id number]  Chief Complaint: Left breast cancer  History of Present Illness Ladine Kiper is a 60 y.o. female with node positive left breast cancer, needing to proceed with chemotherapy.  Postoperative course complicated by infected seroma of the the postmastectomy site.  She has required wound debridement and open wound care.  Her wound continues to make steady progress with 99% granulation.  There is no lingering seroma, nor evidence of infection.  She is anxious to proceed on with her chemotherapy.  Past Medical History Past Medical History:  Diagnosis Date   Anxiety    Aortic stenosis    Diabetes mellitus    Edema    Heart murmur    Hypertension    Sleep apnea    Thyroid disease       Past Surgical History:  Procedure Laterality Date   ABDOMINAL HYSTERECTOMY  11/17/2013   BREAST BIOPSY     x2   BREAST BIOPSY Left 07/20/2021   [place] Bx, Ribbon Clip, Path pending   BREAST BIOPSY Left 07/20/2021   Stereo Bx, X-clip, path pending   BREAST BIOPSY Left 07/20/2021   Stereo biopsy, coil clip, path pending   BREAST BIOPSY Right 07/20/2021   Stereo Bx, Ribbon Clip, path pending   CESAREAN SECTION     x 2   CHOLECYSTECTOMY  12/17/2001   DUE TO STONES   DILATION AND CURETTAGE OF UTERUS     ELBOW SURGERY     POLYPECTOMY  12/17/1993   SIMPLE MASTECTOMY WITH AXILLARY SENTINEL NODE BIOPSY Left 08/16/2021   Procedure: SIMPLE MASTECTOMY WITH AXILLARY SENTINEL NODE BIOPSY;  Surgeon: Ronny Bacon, MD;  Location: [facility] ORS;  Service: General;  Laterality: Left;    No Known Allergies  Current Outpatient Medications  Medication Sig Dispense Refill   Ascorbic Acid (VITAMIN C PO) Take 1 tablet by mouth daily.     aspirin 81 MG EC tablet Take 81 mg by mouth at bedtime.     Cholecalciferol (VITAMIN D3) 1000 UNITS CAPS Take 1,000 Units by mouth in the morning.     Dulaglutide (TRULICITY) 3 XK/4.8JE SOPN Inject  3 mg as directed once a week. 2 mL 3   EUTHYROX 75 MCG tablet TAKE 1 TABLET BY MOUTH ONCE DAILY BEFORE BREAKFAST .OFFICE  VIST  NEEDED  BEFORE  NEXT  REFILL  IS  DUE 90 tablet 3   Ferrous Sulfate (IRON) 325 (65 Fe) MG TABS Take 1 tablet by mouth once daily with breakfast 90 tablet 0   furosemide (LASIX) 40 MG tablet TAKE 1 TABLET BY MOUTH ONCE DAILY AS NEEDED FOR FLUID OR EDEMA 90 tablet 1   glucose blood (CONTOUR NEXT TEST) test strip Test fasting sugar each morning. Recheck if having hypoglycemic symptoms. 100 each 4   glyBURIDE (DIABETA) 5 MG tablet TAKE 1 TABLET BY MOUTH TWICE DAILY WITH MEALS. 90 tablet 3   lidocaine-prilocaine (EMLA) cream Apply to affected area once 30 g 3   metFORMIN (GLUCOPHAGE) 1000 MG tablet Take 1 tablet (1,000 mg total) by mouth 2 (two) times daily with a meal. 180 tablet 3   Multiple Vitamin (MULTIVITAMIN) tablet Take 1 tablet by mouth every morning.     Multiple Vitamins-Minerals (ZINC PO) Take 1 tablet by mouth daily.     ondansetron (ZOFRAN) 8 MG tablet Take 1 tablet (8 mg total) by mouth 2 (two) times daily as needed for refractory nausea / vomiting. [place]  tablet 2   PARoxetine (PAXIL) 20 MG tablet Take 1 tablet (20 mg total) by mouth daily. 30 tablet 2   prochlorperazine (COMPAZINE) 10 MG tablet Take 1 tablet (10 mg total) by mouth every 6 (six) hours as needed (Nausea or vomiting). 60 tablet 2   carvedilol (COREG) 25 MG tablet Take 1 tablet (25 mg total) by mouth 2 (two) times daily. 180 tablet 3   hydrochlorothiazide (HYDRODIURIL) 25 MG tablet Take 1 tablet (25 mg total) by mouth daily. 90 tablet 1   lisinopril (ZESTRIL) 40 MG tablet Take 1 tablet (40 mg total) by mouth daily. 90 tablet 1   silver sulfADIAZINE (SILVADENE) 1 % cream Apply to affected area daily (Patient not taking: Reported on 10/19/2021) 25 g 0   No current facility-administered medications for this visit.    Family History Family History  Problem Relation Age of Onset   Hyperlipidemia Mother     Thyroid disease Mother    Hashimoto's thyroiditis Mother    Heart disease Father        open heart surgery   Heart failure Father    Diabetes Father    Dementia Father    Thyroid disease Brother    Breast cancer Maternal Grandmother    Diabetes Paternal Grandmother    Prostate cancer Paternal Grandfather       Social History Social History   Tobacco Use   Smoking status: Former    Packs/day: 1.50    Years: 25.00    Pack years: 37.50    Types: Cigarettes    Quit date: 11/24/2001    Years since quitting: 19.9   Smokeless tobacco: Never  Vaping Use   Vaping Use: Never used  Substance Use Topics   Alcohol use: Yes    Alcohol/week: 0.0 standard drinks    Comment: OCCASIONALLY DRINKS WINE, ONCE A WEEK   Drug use: No        Review of Systems  All other systems reviewed and are negative.    Physical Exam Blood pressure (!) 160/90, pulse 71, temperature 98.2 F (36.8 C), weight 180 lb (81.6 kg), SpO2 97 %. Last Weight  Most recent update: 10/19/2021  4:05 PM    Weight  81.6 kg (180 lb)             CONSTITUTIONAL: Well developed, and nourished, appropriately responsive and aware without distress.   EYES: Sclera non-icteric.   EARS, NOSE, MOUTH AND THROAT: Mask worn.   Hearing is intact to voice.  NECK: Trachea is midline, and there is no jugular venous distension.  LYMPH NODES:  Lymph nodes in the neck are not enlarged. RESPIRATORY:  Lungs are clear, and breath sounds are equal bilaterally. Normal respiratory effort without pathologic use of accessory muscles. CARDIOVASCULAR: Heart is regular in rate and rhythm. GI: The abdomen is soft, nontender, and nondistended. There were no palpable masses. I did not appreciate hepatosplenomegaly. There were normal bowel sounds. GU: On the left chest wall there is an open wound of the medial aspect of the mastectomy scar, it is well granulated and diminishing in size.  There is no evidence of malodor, erythema or induration.   The remaining left chest wall shows no evidence of persisting seroma.  She does have slight diminished range of motion of the left shoulder. MUSCULOSKELETAL:  Symmetrical muscle tone appreciated in all four extremities.    SKIN: Skin turgor is normal. No pathologic skin lesions appreciated.  NEUROLOGIC:  Motor and sensation appear grossly normal.  Cranial nerves are grossly without defect. PSYCH:  Alert and oriented to person, place and time. Affect is appropriate for situation.  Data Reviewed I have personally reviewed what is currently available of the patient's imaging, recent labs and medical records.   Labs:  CBC Latest Ref Rng & Units 08/08/2021 08/05/2021 07/14/2021  WBC 4.0 - 10.5 K/uL 6.2 7.2 6.5  Hemoglobin 12.0 - 15.0 g/dL 12.4 12.4 12.6  Hematocrit 36.0 - 46.0 % 38.3 37.6 39.7  Platelets 150 - 400 K/uL 133(L) 114(L) 130(L)   CMP Latest Ref Rng & Units 08/08/2021 08/05/2021 07/14/2021  Glucose 70 - 99 mg/dL 110(H) 283(H) 119(H)  BUN 8 - 23 mg/dL 18 25(H) 13  Creatinine 0.44 - 1.00 mg/dL 0.76 1.07(H) 0.59  Sodium 135 - 145 mmol/L 138 138 142  Potassium 3.5 - 5.1 mmol/L 3.7 3.5 3.5  Chloride 98 - 111 mmol/L 99 95(L) 99  CO2 22 - 32 mmol/L 30 27 26   Calcium 8.9 - 10.3 mg/dL 9.5 8.7(L) 9.4  Total Protein 6.5 - 8.1 g/dL 7.0 - 6.8  Total Bilirubin 0.3 - 1.2 mg/dL 0.8 - 0.4  Alkaline Phos 38 - 126 U/L 55 - 63  AST 15 - 41 U/L 36 - 24  ALT 0 - 44 U/L 37 - 32      Imaging:  Within last 24 hrs: No results found.  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   Healing but lingering postoperative wound. Need to proceed with initiation of chemotherapy.  Patient Active Problem List   Diagnosis Date Noted   Infected surgical wound 09/07/2021   Carcinoma of left breast ([place]) 08/30/2021   Status post left mastectomy 08/22/2021   Other peripheral vertigo, unspecified ear 01/02/2020   Pelvic mass 07/08/2015   Hyperlipidemia, mixed 07/08/2015   Hx of iron deficiency  07/08/2015   Absolute anemia 04/27/2015   Diabetic peripheral neuropathy associated with type 2 diabetes mellitus ([place]) 04/27/2015   Breath shortness 04/27/2015   Accumulation of fluid in tissues 04/27/2015   Cardiac murmur 04/27/2015   HLD (hyperlipidemia) 04/27/2015   Decreased potassium in the blood 04/27/2015   Hypothyroidism 04/27/2015   Obstructive sleep apnea 04/27/2015   Disorder of peripheral nervous system 04/27/2015   Obstructive apnea 04/27/2015   Pain in shoulder 04/27/2015   Diabetes mellitus, type 2 ([place]) 04/27/2015   Fibroid uterus 10/25/2013   Aortic valve stenosis 05/01/2011   Edema 03/27/2011   SOB (shortness of breath) 03/27/2011   Diabetes mellitus ([place]) 03/27/2011   HTN (hypertension) 03/27/2011   Essential (primary) hypertension 07/05/2006   Anxiety, generalized 07/05/2006    Plan    Port-A-Cath placement. Port-A-Cath placement risks were discussed in detail including but not limited to anesthesia, bleeding, infection, injury to subclavian or internal jugular vein or adjacent arteries.  Pneumothorax, potentially requiring tube thoracostomy.  Etc.  I believe these are understood to be not all-inclusive, excepted with a desire to proceed.  Questions answered.  No guarantees were expressed or implied.   These notes generated with voice recognition software. I apologize for typographical errors.  Ronny Bacon M.D., FACS 10/23/2021, 11:27 AM

## 2021-10-23 NOTE — Progress Notes (Signed)
Patient ID: Emily Tapia, female   DOB: 1961-08-19, 60 y.o.   MRN: [id number]  Chief Complaint: Left breast cancer  History of Present Illness Emily Tapia is a 60 y.o. female with node positive left breast cancer, needing to proceed with chemotherapy.  Postoperative course complicated by infected seroma of the the postmastectomy site.  She has required wound debridement and open wound care.  Her wound continues to make steady progress with 99% granulation.  There is no lingering seroma, nor evidence of infection.  She is anxious to proceed on with her chemotherapy.  Past Medical History Past Medical History:  Diagnosis Date   Anxiety    Aortic stenosis    Diabetes mellitus    Edema    Heart murmur    Hypertension    Sleep apnea    Thyroid disease       Past Surgical History:  Procedure Laterality Date   ABDOMINAL HYSTERECTOMY  11/17/2013   BREAST BIOPSY     x2   BREAST BIOPSY Left 07/20/2021   [place] Bx, Ribbon Clip, Path pending   BREAST BIOPSY Left 07/20/2021   Stereo Bx, X-clip, path pending   BREAST BIOPSY Left 07/20/2021   Stereo biopsy, coil clip, path pending   BREAST BIOPSY Right 07/20/2021   Stereo Bx, Ribbon Clip, path pending   CESAREAN SECTION     x 2   CHOLECYSTECTOMY  12/17/2001   DUE TO STONES   DILATION AND CURETTAGE OF UTERUS     ELBOW SURGERY     POLYPECTOMY  12/17/1993   SIMPLE MASTECTOMY WITH AXILLARY SENTINEL NODE BIOPSY Left 08/16/2021   Procedure: SIMPLE MASTECTOMY WITH AXILLARY SENTINEL NODE BIOPSY;  Surgeon: Ronny Bacon, MD;  Location: [facility] ORS;  Service: General;  Laterality: Left;    No Known Allergies  Current Outpatient Medications  Medication Sig Dispense Refill   Ascorbic Acid (VITAMIN C PO) Take 1 tablet by mouth daily.     aspirin 81 MG EC tablet Take 81 mg by mouth at bedtime.     Cholecalciferol (VITAMIN D3) 1000 UNITS CAPS Take 1,000 Units by mouth in the morning.     Dulaglutide (TRULICITY) 3 MV/7.8IO SOPN Inject  3 mg as directed once a week. 2 mL 3   EUTHYROX 75 MCG tablet TAKE 1 TABLET BY MOUTH ONCE DAILY BEFORE BREAKFAST .OFFICE  VIST  NEEDED  BEFORE  NEXT  REFILL  IS  DUE 90 tablet 3   Ferrous Sulfate (IRON) 325 (65 Fe) MG TABS Take 1 tablet by mouth once daily with breakfast 90 tablet 0   furosemide (LASIX) 40 MG tablet TAKE 1 TABLET BY MOUTH ONCE DAILY AS NEEDED FOR FLUID OR EDEMA 90 tablet 1   glucose blood (CONTOUR NEXT TEST) test strip Test fasting sugar each morning. Recheck if having hypoglycemic symptoms. 100 each 4   glyBURIDE (DIABETA) 5 MG tablet TAKE 1 TABLET BY MOUTH TWICE DAILY WITH MEALS. 90 tablet 3   lidocaine-prilocaine (EMLA) cream Apply to affected area once 30 g 3   metFORMIN (GLUCOPHAGE) 1000 MG tablet Take 1 tablet (1,000 mg total) by mouth 2 (two) times daily with a meal. 180 tablet 3   Multiple Vitamin (MULTIVITAMIN) tablet Take 1 tablet by mouth every morning.     Multiple Vitamins-Minerals (ZINC PO) Take 1 tablet by mouth daily.     ondansetron (ZOFRAN) 8 MG tablet Take 1 tablet (8 mg total) by mouth 2 (two) times daily as needed for refractory nausea / vomiting. [place]  tablet 2   PARoxetine (PAXIL) 20 MG tablet Take 1 tablet (20 mg total) by mouth daily. 30 tablet 2   prochlorperazine (COMPAZINE) 10 MG tablet Take 1 tablet (10 mg total) by mouth every 6 (six) hours as needed (Nausea or vomiting). 60 tablet 2   carvedilol (COREG) 25 MG tablet Take 1 tablet (25 mg total) by mouth 2 (two) times daily. 180 tablet 3   hydrochlorothiazide (HYDRODIURIL) 25 MG tablet Take 1 tablet (25 mg total) by mouth daily. 90 tablet 1   lisinopril (ZESTRIL) 40 MG tablet Take 1 tablet (40 mg total) by mouth daily. 90 tablet 1   silver sulfADIAZINE (SILVADENE) 1 % cream Apply to affected area daily (Patient not taking: Reported on 10/19/2021) 25 g 0   No current facility-administered medications for this visit.    Family History Family History  Problem Relation Age of Onset   Hyperlipidemia Mother     Thyroid disease Mother    Hashimoto's thyroiditis Mother    Heart disease Father        open heart surgery   Heart failure Father    Diabetes Father    Dementia Father    Thyroid disease Brother    Breast cancer Maternal Grandmother    Diabetes Paternal Grandmother    Prostate cancer Paternal Grandfather       Social History Social History   Tobacco Use   Smoking status: Former    Packs/day: 1.50    Years: 25.00    Pack years: 37.50    Types: Cigarettes    Quit date: 11/24/2001    Years since quitting: 19.9   Smokeless tobacco: Never  Vaping Use   Vaping Use: Never used  Substance Use Topics   Alcohol use: Yes    Alcohol/week: 0.0 standard drinks    Comment: OCCASIONALLY DRINKS WINE, ONCE A WEEK   Drug use: No        Review of Systems  All other systems reviewed and are negative.    Physical Exam Blood pressure (!) 160/90, pulse 71, temperature 98.2 F (36.8 C), weight 180 lb (81.6 kg), SpO2 97 %. Last Weight  Most recent update: 10/19/2021  4:05 PM    Weight  81.6 kg (180 lb)             CONSTITUTIONAL: Well developed, and nourished, appropriately responsive and aware without distress.   EYES: Sclera non-icteric.   EARS, NOSE, MOUTH AND THROAT: Mask worn.   Hearing is intact to voice.  NECK: Trachea is midline, and there is no jugular venous distension.  LYMPH NODES:  Lymph nodes in the neck are not enlarged. RESPIRATORY:  Lungs are clear, and breath sounds are equal bilaterally. Normal respiratory effort without pathologic use of accessory muscles. CARDIOVASCULAR: Heart is regular in rate and rhythm. GI: The abdomen is soft, nontender, and nondistended. There were no palpable masses. I did not appreciate hepatosplenomegaly. There were normal bowel sounds. GU: On the left chest wall there is an open wound of the medial aspect of the mastectomy scar, it is well granulated and diminishing in size.  There is no evidence of malodor, erythema or induration.   The remaining left chest wall shows no evidence of persisting seroma.  She does have slight diminished range of motion of the left shoulder. MUSCULOSKELETAL:  Symmetrical muscle tone appreciated in all four extremities.    SKIN: Skin turgor is normal. No pathologic skin lesions appreciated.  NEUROLOGIC:  Motor and sensation appear grossly normal.  Cranial nerves are grossly without defect. PSYCH:  Alert and oriented to person, place and time. Affect is appropriate for situation.  Data Reviewed I have personally reviewed what is currently available of the patient's imaging, recent labs and medical records.   Labs:  CBC Latest Ref Rng & Units 08/08/2021 08/05/2021 07/14/2021  WBC 4.0 - 10.5 K/uL 6.2 7.2 6.5  Hemoglobin 12.0 - 15.0 g/dL 12.4 12.4 12.6  Hematocrit 36.0 - 46.0 % 38.3 37.6 39.7  Platelets 150 - 400 K/uL 133(L) 114(L) 130(L)   CMP Latest Ref Rng & Units 08/08/2021 08/05/2021 07/14/2021  Glucose 70 - 99 mg/dL 110(H) 283(H) 119(H)  BUN 8 - 23 mg/dL 18 25(H) 13  Creatinine 0.44 - 1.00 mg/dL 0.76 1.07(H) 0.59  Sodium 135 - 145 mmol/L 138 138 142  Potassium 3.5 - 5.1 mmol/L 3.7 3.5 3.5  Chloride 98 - 111 mmol/L 99 95(L) 99  CO2 22 - 32 mmol/L 30 27 26   Calcium 8.9 - 10.3 mg/dL 9.5 8.7(L) 9.4  Total Protein 6.5 - 8.1 g/dL 7.0 - 6.8  Total Bilirubin 0.3 - 1.2 mg/dL 0.8 - 0.4  Alkaline Phos 38 - 126 U/L 55 - 63  AST 15 - 41 U/L 36 - 24  ALT 0 - 44 U/L 37 - 32      Imaging:  Within last 24 hrs: No results found.  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   Healing but lingering postoperative wound. Need to proceed with initiation of chemotherapy.  Patient Active Problem List   Diagnosis Date Noted   Infected surgical wound 09/07/2021   Carcinoma of left breast ([place]) 08/30/2021   Status post left mastectomy 08/22/2021   Other peripheral vertigo, unspecified ear 01/02/2020   Pelvic mass 07/08/2015   Hyperlipidemia, mixed 07/08/2015   Hx of iron deficiency  07/08/2015   Absolute anemia 04/27/2015   Diabetic peripheral neuropathy associated with type 2 diabetes mellitus ([place]) 04/27/2015   Breath shortness 04/27/2015   Accumulation of fluid in tissues 04/27/2015   Cardiac murmur 04/27/2015   HLD (hyperlipidemia) 04/27/2015   Decreased potassium in the blood 04/27/2015   Hypothyroidism 04/27/2015   Obstructive sleep apnea 04/27/2015   Disorder of peripheral nervous system 04/27/2015   Obstructive apnea 04/27/2015   Pain in shoulder 04/27/2015   Diabetes mellitus, type 2 ([place]) 04/27/2015   Fibroid uterus 10/25/2013   Aortic valve stenosis 05/01/2011   Edema 03/27/2011   SOB (shortness of breath) 03/27/2011   Diabetes mellitus ([place]) 03/27/2011   HTN (hypertension) 03/27/2011   Essential (primary) hypertension 07/05/2006   Anxiety, generalized 07/05/2006    Plan    Port-A-Cath placement. Port-A-Cath placement risks were discussed in detail including but not limited to anesthesia, bleeding, infection, injury to subclavian or internal jugular vein or adjacent arteries.  Pneumothorax, potentially requiring tube thoracostomy.  Etc.  I believe these are understood to be not all-inclusive, excepted with a desire to proceed.  Questions answered.  No guarantees were expressed or implied.   These notes generated with voice recognition software. I apologize for typographical errors.  Ronny Bacon M.D., FACS 10/23/2021, 11:27 AM

## 2021-10-23 NOTE — Telephone Encounter (Signed)
Per Secure chat between Dr. Grayland Ormond and Dr. Luther Bradley, pt will be scheduled for port placement on 11/9 and scheduled for treatment on 11/11 with continued close monitoring of wound healing. Pt called and informed of tenative plans with instructions to call Dr Kallie Locks team if she has not heard anything from them regarding scheduling. Pt verbalized understanding.

## 2021-10-23 NOTE — Telephone Encounter (Signed)
Pt has an appt on 11-10 but states that she doesn't have her port yet due to having many infections. She is wondering if she should keep appointments for 11-10 and 11-11. Please give her a call at [phone]

## 2021-10-24 ENCOUNTER — Other Ambulatory Visit: Payer: Self-pay | Admitting: Family Medicine

## 2021-10-24 ENCOUNTER — Telehealth: Payer: Self-pay | Admitting: Surgery

## 2021-10-24 DIAGNOSIS — E1142 Type 2 diabetes mellitus with diabetic polyneuropathy: Secondary | ICD-10-CM

## 2021-10-24 DIAGNOSIS — F411 Generalized anxiety disorder: Secondary | ICD-10-CM

## 2021-10-24 MED ORDER — CHLORHEXIDINE GLUCONATE CLOTH 2 % EX PADS
6.0000 | MEDICATED_PAD | Freq: Once | CUTANEOUS | Status: AC
  Administered 2021-10-25: 6 via TOPICAL

## 2021-10-24 MED ORDER — CELECOXIB 200 MG PO CAPS: 200.0000 mg | ORAL_CAPSULE | ORAL | Status: AC

## 2021-10-24 MED ORDER — SODIUM CHLORIDE 0.9 % IV SOLN: INTRAVENOUS | Status: DC

## 2021-10-24 MED ORDER — ACETAMINOPHEN 500 MG PO TABS: 1000.0000 mg | ORAL_TABLET | ORAL | Status: AC

## 2021-10-24 MED ORDER — CEFAZOLIN SODIUM-DEXTROSE 2-4 GM/100ML-% IV SOLN
2.0000 g | INTRAVENOUS | Status: AC
  Administered 2021-10-25: 2 g via INTRAVENOUS

## 2021-10-24 MED ORDER — ORAL CARE MOUTH RINSE: 15.0000 mL | Freq: Once | OROMUCOSAL | Status: AC

## 2021-10-24 MED ORDER — CHLORHEXIDINE GLUCONATE 0.12 % MT SOLN: 15.0000 mL | Freq: Once | OROMUCOSAL | Status: AC

## 2021-10-24 MED ORDER — CHLORHEXIDINE GLUCONATE CLOTH 2 % EX PADS: 6.0000 | MEDICATED_PAD | Freq: Once | CUTANEOUS | Status: DC

## 2021-10-24 MED ORDER — GABAPENTIN 300 MG PO CAPS: 300.0000 mg | ORAL_CAPSULE | ORAL | Status: AC

## 2021-10-24 NOTE — Telephone Encounter (Signed)
[facility] faxed refill request for the following medications:   PARoxetine (PAXIL) 20 MG tablet   furosemide (LASIX) 40 MG tablet   metFORMIN (GLUCOPHAGE) 1000 MG tablet   carvedilol (COREG) 25 MG tablet   levothyroxine (SYNTHROID, LEVOTHROID) 75 MCG tablet   lisinopril (ZESTRIL) 40 MG tablet    glyBURIDE (DIABETA) 5 MG tablet     Please advise.

## 2021-10-24 NOTE — Telephone Encounter (Signed)
Patient has been advised of Pre-Admission date/time, COVID Testing date and Surgery date.  Surgery Date: 10/25/21 patient to arrive 2 hours early.   Preadmission Testing Date: 10/25/21 arrive 2 hours early.  Patient informed to arrive between 9:30 to 10:00 am @ [facility].    Patient informed not to eat or drink after midnight and not to take any of her morning meds, but can take them with her and the nurse can go over this with her when she arrives.  Patient verbalized understanding.

## 2021-10-25 ENCOUNTER — Encounter
Admission: RE | Disposition: A | Discharge status: Home / Self Care | Payer: Self-pay | Source: Home / Self Care | Attending: Surgery

## 2021-10-25 ENCOUNTER — Ambulatory Visit: Payer: 59 | Admitting: Anesthesiology

## 2021-10-25 ENCOUNTER — Ambulatory Visit: Payer: 59

## 2021-10-25 ENCOUNTER — Ambulatory Visit
Admission: RE | Admit: 2021-10-25 | Discharge: 2021-10-25 | Disposition: A | Discharge status: Home / Self Care | Payer: 59 | Attending: Surgery | Admitting: Surgery

## 2021-10-25 ENCOUNTER — Other Ambulatory Visit: Payer: Self-pay

## 2021-10-25 ENCOUNTER — Encounter: Payer: Self-pay | Admitting: Surgery

## 2021-10-25 DIAGNOSIS — I10 Essential (primary) hypertension: Secondary | ICD-10-CM | POA: Diagnosis not present

## 2021-10-25 DIAGNOSIS — C50912 Malignant neoplasm of unspecified site of left female breast: Secondary | ICD-10-CM | POA: Insufficient documentation

## 2021-10-25 DIAGNOSIS — Z789 Other specified health status: Secondary | ICD-10-CM

## 2021-10-25 DIAGNOSIS — E039 Hypothyroidism, unspecified: Secondary | ICD-10-CM | POA: Insufficient documentation

## 2021-10-25 DIAGNOSIS — Z7984 Long term (current) use of oral hypoglycemic drugs: Secondary | ICD-10-CM | POA: Insufficient documentation

## 2021-10-25 DIAGNOSIS — D649 Anemia, unspecified: Secondary | ICD-10-CM | POA: Diagnosis not present

## 2021-10-25 DIAGNOSIS — E119 Type 2 diabetes mellitus without complications: Secondary | ICD-10-CM | POA: Insufficient documentation

## 2021-10-25 DIAGNOSIS — C50212 Malignant neoplasm of upper-inner quadrant of left female breast: Secondary | ICD-10-CM

## 2021-10-25 DIAGNOSIS — I350 Nonrheumatic aortic (valve) stenosis: Secondary | ICD-10-CM | POA: Insufficient documentation

## 2021-10-25 DIAGNOSIS — F419 Anxiety disorder, unspecified: Secondary | ICD-10-CM | POA: Diagnosis not present

## 2021-10-25 DIAGNOSIS — Z87891 Personal history of nicotine dependence: Secondary | ICD-10-CM | POA: Insufficient documentation

## 2021-10-25 DIAGNOSIS — Z95828 Presence of other vascular implants and grafts: Secondary | ICD-10-CM

## 2021-10-25 DIAGNOSIS — Z170 Estrogen receptor positive status [ER+]: Secondary | ICD-10-CM | POA: Diagnosis not present

## 2021-10-25 HISTORY — PX: PORTACATH PLACEMENT: SHX2246

## 2021-10-25 LAB — CBC WITH DIFFERENTIAL/PLATELET
Abs Immature Granulocytes: 0.02 10*3/uL (ref 0.00–0.07)
Basophils Absolute: 0 10*3/uL (ref 0.0–0.1)
Basophils Relative: 1 %
Eosinophils Absolute: 0.1 10*3/uL (ref 0.0–0.5)
Eosinophils Relative: 2 %
HCT: 38.8 % (ref 36.0–46.0)
Hemoglobin: 12.3 g/dL (ref 12.0–15.0)
Immature Granulocytes: 0 %
Lymphocytes Relative: 29 %
Lymphs Abs: 1.8 10*3/uL (ref 0.7–4.0)
MCH: 25.8 pg — ABNORMAL LOW (ref 26.0–34.0)
MCHC: 31.7 g/dL (ref 30.0–36.0)
MCV: 81.5 fL (ref 80.0–100.0)
Monocytes Absolute: 0.3 10*3/uL (ref 0.1–1.0)
Monocytes Relative: 6 %
Neutro Abs: 3.8 10*3/uL (ref 1.7–7.7)
Neutrophils Relative %: 62 %
Platelets: 122 10*3/uL — ABNORMAL LOW (ref 150–400)
RBC: 4.76 MIL/uL (ref 3.87–5.11)
RDW: 14.6 % (ref 11.5–15.5)
WBC: 6.1 10*3/uL (ref 4.0–10.5)
nRBC: 0 % (ref 0.0–0.2)

## 2021-10-25 LAB — COMPREHENSIVE METABOLIC PANEL
ALT: 35 U/L (ref 0–44)
AST: 41 U/L (ref 15–41)
Albumin: 4.2 g/dL (ref 3.5–5.0)
Alkaline Phosphatase: 50 U/L (ref 38–126)
Anion gap: 10 (ref 5–15)
BUN: 12 mg/dL (ref 8–23)
CO2: 28 mmol/L (ref 22–32)
Calcium: 8.7 mg/dL — ABNORMAL LOW (ref 8.9–10.3)
Chloride: 100 mmol/L (ref 98–111)
Creatinine, Ser: 0.57 mg/dL (ref 0.44–1.00)
GFR, Estimated: >60 mL/min (ref >60)
Glucose, Bld: 116 mg/dL — ABNORMAL HIGH (ref 70–99)
Potassium: 3.7 mmol/L (ref 3.5–5.1)
Sodium: 138 mmol/L (ref 135–145)
Total Bilirubin: 0.8 mg/dL (ref 0.3–1.2)
Total Protein: 6.8 g/dL (ref 6.5–8.1)

## 2021-10-25 LAB — GLUCOSE, CAPILLARY
Glucose-Capillary: 119 mg/dL — ABNORMAL HIGH (ref 70–99)
Glucose-Capillary: 114 mg/dL — ABNORMAL HIGH (ref 70–99)

## 2021-10-25 SURGERY — INSERTION, TUNNELED CENTRAL VENOUS DEVICE, WITH PORT
Anesthesia: General | Laterality: Right

## 2021-10-25 MED ORDER — MIDAZOLAM HCL 2 MG/2ML IJ SOLN
INTRAMUSCULAR | Status: DC | PRN
  Administered 2021-10-25: 2 mg via INTRAVENOUS

## 2021-10-25 MED ORDER — PHENYLEPHRINE HCL (PRESSORS) 10 MG/ML IV SOLN
INTRAVENOUS | Status: DC | PRN
  Administered 2021-10-25 (×2): 160 ug via INTRAVENOUS
  Administered 2021-10-25: 80 ug via INTRAVENOUS
  Administered 2021-10-25: 160 ug via INTRAVENOUS

## 2021-10-25 MED ORDER — CHLORHEXIDINE GLUCONATE 0.12 % MT SOLN
OROMUCOSAL | Status: AC
  Administered 2021-10-25: 15 mL via OROMUCOSAL
  Filled 2021-10-25: qty 15

## 2021-10-25 MED ORDER — ONDANSETRON HCL 4 MG/2ML IJ SOLN: 4.0000 mg | Freq: Once | INTRAMUSCULAR | Status: DC | PRN

## 2021-10-25 MED ORDER — FENTANYL CITRATE (PF) 100 MCG/2ML IJ SOLN
INTRAMUSCULAR | Status: DC | PRN
  Administered 2021-10-25 (×4): 25 ug via INTRAVENOUS

## 2021-10-25 MED ORDER — CELECOXIB 200 MG PO CAPS
ORAL_CAPSULE | ORAL | Status: AC
  Administered 2021-10-25: 200 mg via ORAL
  Filled 2021-10-25: qty 1

## 2021-10-25 MED ORDER — SODIUM CHLORIDE 0.9 % IV SOLN
INTRAVENOUS | Status: DC | PRN
  Administered 2021-10-25: 10 mL via INTRAMUSCULAR

## 2021-10-25 MED ORDER — PROPOFOL 10 MG/ML IV BOLUS
INTRAVENOUS | Status: DC | PRN
  Administered 2021-10-25: 20 mg via INTRAVENOUS
  Administered 2021-10-25: 30 mg via INTRAVENOUS

## 2021-10-25 MED ORDER — SODIUM CHLORIDE 0.9 % IR SOLN
Status: DC | PRN
  Administered 2021-10-25: 500 mL

## 2021-10-25 MED ORDER — SODIUM CHLORIDE FLUSH 0.9 % IV SOLN
INTRAVENOUS | Status: AC
  Filled 2021-10-25: qty 10

## 2021-10-25 MED ORDER — FENTANYL CITRATE (PF) 100 MCG/2ML IJ SOLN
INTRAMUSCULAR | Status: AC
  Filled 2021-10-25: qty 2

## 2021-10-25 MED ORDER — ONDANSETRON HCL 4 MG/2ML IJ SOLN
INTRAMUSCULAR | Status: AC
  Filled 2021-10-25: qty 2

## 2021-10-25 MED ORDER — IBUPROFEN 800 MG PO TABS: 800.0000 mg | ORAL_TABLET | Freq: Three times a day (TID) | ORAL | 0 refills | Status: DC | PRN

## 2021-10-25 MED ORDER — FENTANYL CITRATE (PF) 100 MCG/2ML IJ SOLN: 25.0000 ug | INTRAMUSCULAR | Status: DC | PRN

## 2021-10-25 MED ORDER — GLYCOPYRROLATE 0.2 MG/ML IJ SOLN
INTRAMUSCULAR | Status: DC | PRN
  Administered 2021-10-25: .2 mg via INTRAVENOUS

## 2021-10-25 MED ORDER — PROPOFOL 500 MG/50ML IV EMUL
INTRAVENOUS | Status: AC
  Filled 2021-10-25: qty 50

## 2021-10-25 MED ORDER — HYDROCODONE-ACETAMINOPHEN 5-325 MG PO TABS: 1.0000 | ORAL_TABLET | Freq: Four times a day (QID) | ORAL | 0 refills | Status: DC | PRN

## 2021-10-25 MED ORDER — BUPIVACAINE-EPINEPHRINE (PF) 0.25% -1:200000 IJ SOLN
INTRAMUSCULAR | Status: AC
  Filled 2021-10-25: qty 30

## 2021-10-25 MED ORDER — ACETAMINOPHEN 500 MG PO TABS
ORAL_TABLET | ORAL | Status: AC
  Administered 2021-10-25: 1000 mg via ORAL
  Filled 2021-10-25: qty 2

## 2021-10-25 MED ORDER — LIDOCAINE HCL (CARDIAC) PF 100 MG/5ML IV SOSY
PREFILLED_SYRINGE | INTRAVENOUS | Status: DC | PRN
  Administered 2021-10-25: 100 mg via INTRATRACHEAL

## 2021-10-25 MED ORDER — ONDANSETRON HCL 4 MG/2ML IJ SOLN
INTRAMUSCULAR | Status: DC | PRN
  Administered 2021-10-25: 4 mg via INTRAVENOUS

## 2021-10-25 MED ORDER — EPHEDRINE SULFATE 50 MG/ML IJ SOLN
INTRAMUSCULAR | Status: DC | PRN
  Administered 2021-10-25 (×2): 5 mg via INTRAVENOUS

## 2021-10-25 MED ORDER — SODIUM CHLORIDE (PF) 0.9 % IJ SOLN
INTRAMUSCULAR | Status: AC
  Filled 2021-10-25: qty 50

## 2021-10-25 MED ORDER — CEFAZOLIN SODIUM-DEXTROSE 2-4 GM/100ML-% IV SOLN
INTRAVENOUS | Status: AC
  Filled 2021-10-25: qty 100

## 2021-10-25 MED ORDER — DEXAMETHASONE SODIUM PHOSPHATE 10 MG/ML IJ SOLN
INTRAMUSCULAR | Status: AC
  Filled 2021-10-25: qty 1

## 2021-10-25 MED ORDER — DEXAMETHASONE SODIUM PHOSPHATE 10 MG/ML IJ SOLN
INTRAMUSCULAR | Status: DC | PRN
  Administered 2021-10-25: 10 mg via INTRAVENOUS

## 2021-10-25 MED ORDER — HEPARIN 5000 UNITS IN NS 1000 ML (FLUSH)
INTRAMUSCULAR | Status: DC | PRN
  Administered 2021-10-25: 5 mL via INTRAMUSCULAR

## 2021-10-25 MED ORDER — HEPARIN SODIUM (PORCINE) 5000 UNIT/ML IJ SOLN
INTRAMUSCULAR | Status: AC
  Filled 2021-10-25: qty 2

## 2021-10-25 MED ORDER — PROPOFOL 500 MG/50ML IV EMUL
INTRAVENOUS | Status: DC | PRN
Start: 2021-10-25 — End: 2021-10-25
  Administered 2021-10-25: 125 ug/kg/min via INTRAVENOUS

## 2021-10-25 MED ORDER — GABAPENTIN 300 MG PO CAPS
ORAL_CAPSULE | ORAL | Status: AC
  Administered 2021-10-25: 300 mg via ORAL
  Filled 2021-10-25: qty 1

## 2021-10-25 MED ORDER — MIDAZOLAM HCL 2 MG/2ML IJ SOLN
INTRAMUSCULAR | Status: AC
  Filled 2021-10-25: qty 2

## 2021-10-25 MED ORDER — BUPIVACAINE-EPINEPHRINE (PF) 0.25% -1:200000 IJ SOLN
INTRAMUSCULAR | Status: DC | PRN
  Administered 2021-10-25: 10 mL via PERINEURAL

## 2021-10-25 SURGICAL SUPPLY — 33 items
ADH SKN CLS APL DERMABOND .7 (GAUZE/BANDAGES/DRESSINGS) ×1
APL PRP STRL LF DISP 70% ISPRP (MISCELLANEOUS) ×1
BAG DECANTER FOR FLEXI CONT (MISCELLANEOUS) ×2 IMPLANT
BLADE SURG 15 STRL LF DISP TIS (BLADE) ×1 IMPLANT
BLADE SURG 15 STRL SS (BLADE) ×2
BOOT SUTURE AID YELLOW STND (SUTURE) IMPLANT
CHLORAPREP W/TINT 26 (MISCELLANEOUS) ×2 IMPLANT
COVER LIGHT HANDLE STERIS (MISCELLANEOUS) IMPLANT
DERMABOND ADVANCED (GAUZE/BANDAGES/DRESSINGS) ×1
DERMABOND ADVANCED .7 DNX12 (GAUZE/BANDAGES/DRESSINGS) ×1 IMPLANT
DRAPE C-ARM XRAY 36X54 (DRAPES) ×2 IMPLANT
ELECT CAUTERY BLADE 6.4 (BLADE) ×2 IMPLANT
ELECT REM PT RETURN 9FT ADLT (ELECTROSURGICAL) ×2
ELECTRODE REM PT RTRN 9FT ADLT (ELECTROSURGICAL) ×1 IMPLANT
GAUZE 4X4 16PLY ~~LOC~~+RFID DBL (SPONGE) ×2 IMPLANT
GAUZE SPONGE 4X4 12PLY STRL (GAUZE/BANDAGES/DRESSINGS) ×2 IMPLANT
GLOVE SURG ORTHO LTX SZ7.5 (GLOVE) ×4 IMPLANT
GOWN STRL REUS W/ TWL LRG LVL3 (GOWN DISPOSABLE) ×2 IMPLANT
GOWN STRL REUS W/TWL LRG LVL3 (GOWN DISPOSABLE) ×4
IV NS 500ML (IV SOLUTION) ×2
IV NS 500ML BAXH (IV SOLUTION) ×1 IMPLANT
KIT PORT POWER 8FR ISP CVUE (Port) ×2 IMPLANT
KIT TURNOVER KIT A (KITS) ×2 IMPLANT
MANIFOLD NEPTUNE II (INSTRUMENTS) IMPLANT
NEEDLE HYPO 22GX1.5 SAFETY (NEEDLE) ×2 IMPLANT
PACK PORT-A-CATH (MISCELLANEOUS) ×2 IMPLANT
SUT MNCRL AB 4-0 PS2 18 (SUTURE) ×2 IMPLANT
SUT VIC AB 3-0 SH 27 (SUTURE) ×2
SUT VIC AB 3-0 SH 27X BRD (SUTURE) ×1 IMPLANT
SYR 10ML LL (SYRINGE) ×2 IMPLANT
SYR 3ML LL SCALE MARK (SYRINGE) ×2 IMPLANT
SYR 5ML LL (SYRINGE) ×2 IMPLANT
WATER STERILE IRR 500ML POUR (IV SOLUTION) IMPLANT

## 2021-10-25 NOTE — Transfer of Care (Signed)
Immediate Anesthesia Transfer of Care Note  Patient: Emily Tapia  Procedure(s) Performed: INSERTION PORT-A-CATH (Right)  Patient Location: PACU  Anesthesia Type:General  Level of Consciousness: awake, drowsy and patient cooperative  Airway & Oxygen Therapy: Patient Spontanous Breathing  Post-op Assessment: Report given to RN and Post -op Vital signs reviewed and stable  Post vital signs: Reviewed and stable  Last Vitals:  Vitals Value Taken Time  BP 148/74 10/25/21 1327  Temp 36.5 C 10/25/21 1327  Pulse 78 10/25/21 1329  Resp 8 10/25/21 1329  SpO2 98 % 10/25/21 1329  Vitals shown include unvalidated device data.  Last Pain:  Vitals:   10/25/21 1046  TempSrc: Oral  PainSc: 0-No pain         Complications: No notable events documented.

## 2021-10-25 NOTE — Op Note (Signed)
SURGICAL PROCEDURE REPORT  DATE OF PROCEDURE: 10/25/2021   ATTENDING SURGEON:  Ronny Bacon, M.D., FACS   ANESTHESIA: Local with MAC  PRE-OPERATIVE DIAGNOSIS: Advanced left breast cancer requiring durable central venous access for chemotherapy   POST-OPERATIVE DIAGNOSIS: Same  PROCEDURE(S): (cpt: 66440, 34742) 1.) Percutaneous access of Right internal jugular vein under ultrasound guidance  2.) Insertion of tunneled Right internal jugular Bard PowerPort central venous catheter with subcutaneous port under fluoroscopic guidance  INTRAOPERATIVE FINDINGS: Patent easily compressible Right internal jugular vein with appropriate respiratory variations and well-secured tunneled central venous catheter with subcutaneous port at completion of the procedure  FLUOROSCOPY: as recorded.   ESTIMATED BLOOD LOSS: Minimal (<20 mL)   SPECIMENS: None   IMPLANTS: 52F tunneled Bard PowerPort central venous catheter with subcutaneous port  DRAINS: None   COMPLICATIONS: None apparent   CONDITION AT COMPLETION: Hemodynamically stable, awake   DISPOSITION: PACU   INDICATION(S) FOR PROCEDURE:  Patient is a 60 y.o. female who presented with advanced left breast cancer requiring durable central venous access for chemotherapy. All risks, benefits, and alternatives to above elective procedures were discussed with the patient, who elected to proceed, and informed consent was accordingly obtained at that time.  DETAILS OF PROCEDURE:  Patient was brought to the operative suite and appropriately identified. Right IJ venous access site and planned  port placement site were prepped and draped in the usual sterile fashion.  Multiple passes were trialed in the right subclavian area, on the first pass obtain what appeared to be subclavian arterial access, this was immediately aborted.  Further attempts were completed without good venous return on any subsequent past. Limited duplex evaluation of the Right  internal jugular vein was performed. In Trendelenburg position, percutaneous Right IJ venous access was obtained under ultrasound guidance using Seldinger technique, by which local anesthetic was injected over the IJ vein, and access needle was inserted under direct ultrasound visualization into the IJ vein, through which soft guidewire was advanced, over which access needle was withdrawn. Guidewire was secured, attention was directed to injection of local anesthetic along the planned tunnel site, 2-3 cm transverse ipsilateral chest incision was made and confirmed to accommodate the subcutaneous port, and flushed catheter was tunneled retrograde from the port site over the chest to the ipsilateral IJ access site with the attached port well-secured to the catheter and within the subcutaneous pocket. Insertion sheath was advanced over the guidewire, which was withdrawn along with the insertion sheath dilator. Length of catheter needed to position the catheter tip at the atrio-caval junction was then measured under direct fluoroscopic visualization, after which the catheter was cut to the measured length and advanced through the sheath into the internal jugular vein and SVC without evidence of cardiac arrhythmias during the procedure. Port was confirmed to withdraw blood and flush easily, after which concentrated heparin was instilled into the port and catheter. Dermis at the subcutaneous pocket was re-approximated using buried interrupted 3-0 Vicryl suture, and 4-0 Monocryl suture was used to re-approximate skin at the insertion and subcutaneous port sites in running subcuticular fashion for the subcutaneous port and buried interrupted fashion for the insertion site. Skin was cleaned, dried, and sterile skin glue was applied. Patient was then safely transferred to PACU for a chest x-ray.  I was present for all aspects of the procedures, and no intraprocedural complications were apparent.  Ronny Bacon, M.D.,  [facility] 10/25/2021 1:27 PM

## 2021-10-25 NOTE — Anesthesia Procedure Notes (Signed)
Procedure Name: General with mask airway Date/Time: 10/25/2021 1:05 PM Performed by: Kelton Pillar, CRNA Pre-anesthesia Checklist: Patient identified, Emergency Drugs available, Suction available and Patient being monitored Patient Re-evaluated:Patient Re-evaluated prior to induction Oxygen Delivery Method: Simple face mask Induction Type: IV induction Airway Equipment and Method: Oral airway Placement Confirmation: positive ETCO2, breath sounds checked- equal and bilateral and CO2 detector Dental Injury: Teeth and Oropharynx as per pre-operative assessment

## 2021-10-25 NOTE — Telephone Encounter (Signed)
Pt calling to follow up on refill request. Pt will be out by Friday. Please advise.

## 2021-10-25 NOTE — Interval H&P Note (Signed)
History and Physical Interval Note:  10/25/2021 10:57 AM  Emily Tapia  has presented today for surgery, with the diagnosis of infected surgical wound, s/p mastectomy.  The various methods of treatment have been discussed with the patient and family. After consideration of risks, benefits and other options for treatment, the patient has consented to  Procedure(s): INSERTION PORT-A-CATH (Right) as a surgical intervention.  The patient's history has been reviewed, patient examined, no change in status, stable for surgery.  I have reviewed the patient's chart and labs.  Questions were answered to the patient's satisfaction.    The right side is marked.   Ronny Bacon

## 2021-10-25 NOTE — Anesthesia Postprocedure Evaluation (Signed)
Anesthesia Post Note  Patient: Emily Tapia  Procedure(s) Performed: INSERTION PORT-A-CATH (Right)  Patient location during evaluation: PACU Anesthesia Type: General Level of consciousness: awake and alert and oriented Pain management: pain level controlled Vital Signs Assessment: post-procedure vital signs reviewed and stable Respiratory status: spontaneous breathing, nonlabored ventilation and respiratory function stable Cardiovascular status: blood pressure returned to baseline and stable Postop Assessment: no signs of nausea or vomiting Anesthetic complications: no   No notable events documented.   Last Vitals:  Vitals:   10/25/21 1410 10/25/21 1419  BP: 132/71 136/66  Pulse: 80 79  Resp: (!) 24 18  Temp:    SpO2: 93% 96%    Last Pain:  Vitals:   10/25/21 1419  TempSrc:   PainSc: 0-No pain                 Kyshawn Teal

## 2021-10-25 NOTE — Discharge Instructions (Signed)
AMBULATORY SURGERY  DISCHARGE INSTRUCTIONS   The drugs that you were given will stay in your system until tomorrow so for the next 24 hours you should not:  Drive an automobile Make any legal decisions Drink any alcoholic beverage   You may resume regular meals tomorrow.  Today it is better to start with liquids and gradually work up to solid foods.  You may eat anything you prefer, but it is better to start with liquids, then soup and crackers, and gradually work up to solid foods.   Please notify your doctor immediately if you have any unusual bleeding, trouble breathing, redness and pain at the surgery site, drainage, fever, or pain not relieved by medication.       Please contact your physician with any problems or Same Day Surgery at [phone], Monday through Friday 6 am to 4 pm, or [place] at  Main number at [phone].  

## 2021-10-25 NOTE — Anesthesia Preprocedure Evaluation (Signed)
Anesthesia Evaluation  Patient identified by MRN, date of birth, ID band Patient awake    Reviewed: Allergy & Precautions, NPO status , Patient's Chart, lab work & pertinent test results  History of Anesthesia Complications Negative for: history of anesthetic complications  Airway Mallampati: III  TM Distance: >3 FB Neck ROM: Full    Dental  (+) Poor Dentition   Pulmonary sleep apnea , neg COPD, former smoker,    breath sounds clear to auscultation- rhonchi (-) wheezing      Cardiovascular hypertension, Pt. on medications (-) CAD, (-) Past MI, (-) Cardiac Stents and (-) CABG  Rhythm:Regular Rate:Normal - Systolic murmurs and - Diastolic murmurs    Neuro/Psych neg Seizures PSYCHIATRIC DISORDERS Anxiety negative neurological ROS     GI/Hepatic negative GI ROS, Neg liver ROS,   Endo/Other  diabetes, Oral Hypoglycemic AgentsHypothyroidism   Renal/GU negative Renal ROS     Musculoskeletal negative musculoskeletal ROS (+)   Abdominal (+) + obese,   Peds  Hematology  (+) anemia ,   Anesthesia Other Findings Past Medical History: No date: Anxiety No date: Aortic stenosis No date: Diabetes mellitus No date: Edema No date: Heart murmur No date: Hypertension No date: Sleep apnea No date: Thyroid disease   Reproductive/Obstetrics                             Anesthesia Physical Anesthesia Plan  ASA: 3  Anesthesia Plan: General   Post-op Pain Management:    Induction: Intravenous  PONV Risk Score and Plan: 2 and Propofol infusion  Airway Management Planned: Natural Airway  Additional Equipment:   Intra-op Plan:   Post-operative Plan:   Informed Consent: I have reviewed the patients History and Physical, chart, labs and discussed the procedure including the risks, benefits and alternatives for the proposed anesthesia with the patient or authorized representative who has indicated  his/her understanding and acceptance.     Dental advisory given  Plan Discussed with: CRNA and Anesthesiologist  Anesthesia Plan Comments:         Anesthesia Quick Evaluation

## 2021-10-26 ENCOUNTER — Inpatient Hospital Stay: Payer: 59

## 2021-10-26 ENCOUNTER — Inpatient Hospital Stay: Payer: 59 | Attending: Oncology | Admitting: Oncology

## 2021-10-26 ENCOUNTER — Encounter: Payer: Self-pay | Admitting: Surgery

## 2021-10-26 VITALS — BP 164/72 | HR 69 | Temp 98.1°F | Resp 18 | Wt 186.3 lb

## 2021-10-26 DIAGNOSIS — D72829 Elevated white blood cell count, unspecified: Secondary | ICD-10-CM | POA: Insufficient documentation

## 2021-10-26 DIAGNOSIS — Z8249 Family history of ischemic heart disease and other diseases of the circulatory system: Secondary | ICD-10-CM | POA: Diagnosis not present

## 2021-10-26 DIAGNOSIS — I10 Essential (primary) hypertension: Secondary | ICD-10-CM | POA: Insufficient documentation

## 2021-10-26 DIAGNOSIS — C779 Secondary and unspecified malignant neoplasm of lymph node, unspecified: Secondary | ICD-10-CM | POA: Insufficient documentation

## 2021-10-26 DIAGNOSIS — L089 Local infection of the skin and subcutaneous tissue, unspecified: Secondary | ICD-10-CM | POA: Insufficient documentation

## 2021-10-26 DIAGNOSIS — I350 Nonrheumatic aortic (valve) stenosis: Secondary | ICD-10-CM | POA: Diagnosis not present

## 2021-10-26 DIAGNOSIS — Z9049 Acquired absence of other specified parts of digestive tract: Secondary | ICD-10-CM | POA: Diagnosis not present

## 2021-10-26 DIAGNOSIS — E039 Hypothyroidism, unspecified: Secondary | ICD-10-CM | POA: Diagnosis not present

## 2021-10-26 DIAGNOSIS — I700 Atherosclerosis of aorta: Secondary | ICD-10-CM | POA: Diagnosis not present

## 2021-10-26 DIAGNOSIS — Z818 Family history of other mental and behavioral disorders: Secondary | ICD-10-CM | POA: Insufficient documentation

## 2021-10-26 DIAGNOSIS — C50812 Malignant neoplasm of overlapping sites of left female breast: Secondary | ICD-10-CM

## 2021-10-26 DIAGNOSIS — Z803 Family history of malignant neoplasm of breast: Secondary | ICD-10-CM | POA: Insufficient documentation

## 2021-10-26 DIAGNOSIS — Z833 Family history of diabetes mellitus: Secondary | ICD-10-CM | POA: Diagnosis not present

## 2021-10-26 DIAGNOSIS — Z170 Estrogen receptor positive status [ER+]: Secondary | ICD-10-CM

## 2021-10-26 DIAGNOSIS — Z8042 Family history of malignant neoplasm of prostate: Secondary | ICD-10-CM | POA: Diagnosis not present

## 2021-10-26 DIAGNOSIS — Z87891 Personal history of nicotine dependence: Secondary | ICD-10-CM | POA: Insufficient documentation

## 2021-10-26 DIAGNOSIS — Z8349 Family history of other endocrine, nutritional and metabolic diseases: Secondary | ICD-10-CM | POA: Diagnosis not present

## 2021-10-26 DIAGNOSIS — E871 Hypo-osmolality and hyponatremia: Secondary | ICD-10-CM | POA: Insufficient documentation

## 2021-10-26 DIAGNOSIS — Z79899 Other long term (current) drug therapy: Secondary | ICD-10-CM | POA: Insufficient documentation

## 2021-10-26 DIAGNOSIS — E119 Type 2 diabetes mellitus without complications: Secondary | ICD-10-CM | POA: Diagnosis not present

## 2021-10-26 LAB — CBC WITH DIFFERENTIAL/PLATELET
Abs Immature Granulocytes: 0.06 10*3/uL (ref 0.00–0.07)
Basophils Absolute: 0 10*3/uL (ref 0.0–0.1)
Basophils Relative: 0 %
Eosinophils Absolute: 0 10*3/uL (ref 0.0–0.5)
Eosinophils Relative: 0 %
HCT: 36.4 % (ref 36.0–46.0)
Hemoglobin: 11.8 g/dL — ABNORMAL LOW (ref 12.0–15.0)
Immature Granulocytes: 1 %
Lymphocytes Relative: 17 %
Lymphs Abs: 1.7 10*3/uL (ref 0.7–4.0)
MCH: 26 pg (ref 26.0–34.0)
MCHC: 32.4 g/dL (ref 30.0–36.0)
MCV: 80.2 fL (ref 80.0–100.0)
Monocytes Absolute: 0.6 10*3/uL (ref 0.1–1.0)
Monocytes Relative: 6 %
Neutro Abs: 7.9 10*3/uL — ABNORMAL HIGH (ref 1.7–7.7)
Neutrophils Relative %: 76 %
Platelets: 130 10*3/uL — ABNORMAL LOW (ref 150–400)
RBC: 4.54 MIL/uL (ref 3.87–5.11)
RDW: 14.6 % (ref 11.5–15.5)
WBC: 10.3 10*3/uL (ref 4.0–10.5)
nRBC: 0 % (ref 0.0–0.2)

## 2021-10-26 LAB — COMPREHENSIVE METABOLIC PANEL
ALT: 33 U/L (ref 0–44)
AST: 26 U/L (ref 15–41)
Albumin: 4.5 g/dL (ref 3.5–5.0)
Alkaline Phosphatase: 54 U/L (ref 38–126)
Anion gap: 14 (ref 5–15)
BUN: 17 mg/dL (ref 8–23)
CO2: 28 mmol/L (ref 22–32)
Calcium: 9 mg/dL (ref 8.9–10.3)
Chloride: 95 mmol/L — ABNORMAL LOW (ref 98–111)
Creatinine, Ser: 0.71 mg/dL (ref 0.44–1.00)
GFR, Estimated: >60 mL/min (ref >60)
Glucose, Bld: 225 mg/dL — ABNORMAL HIGH (ref 70–99)
Potassium: 3.8 mmol/L (ref 3.5–5.1)
Sodium: 137 mmol/L (ref 135–145)
Total Bilirubin: 0.5 mg/dL (ref 0.3–1.2)
Total Protein: 7.5 g/dL (ref 6.5–8.1)

## 2021-10-26 MED ORDER — METFORMIN HCL 1000 MG PO TABS: 1000.0000 mg | ORAL_TABLET | Freq: Two times a day (BID) | ORAL | 1 refills | Status: DC

## 2021-10-26 MED ORDER — LISINOPRIL 40 MG PO TABS: 40.0000 mg | ORAL_TABLET | Freq: Every day | ORAL | 1 refills | Status: DC

## 2021-10-26 MED ORDER — GLYBURIDE 5 MG PO TABS: ORAL_TABLET | ORAL | 1 refills | Status: DC

## 2021-10-26 MED ORDER — PAROXETINE HCL 20 MG PO TABS: 20.0000 mg | ORAL_TABLET | Freq: Every day | ORAL | 1 refills | Status: DC

## 2021-10-26 MED ORDER — FUROSEMIDE 40 MG PO TABS: ORAL_TABLET | ORAL | 1 refills | Status: DC

## 2021-10-26 MED ORDER — LEVOTHYROXINE SODIUM 75 MCG PO TABS: ORAL_TABLET | ORAL | 1 refills | Status: DC

## 2021-10-26 MED ORDER — CARVEDILOL 25 MG PO TABS: 25.0000 mg | ORAL_TABLET | Freq: Two times a day (BID) | ORAL | 1 refills | Status: DC

## 2021-10-26 NOTE — Telephone Encounter (Signed)
Medications refilled, patient had office visit 07/14/21. Im unable to sign off on Lasix can you sign prescription and send? Thanks. KW

## 2021-10-26 NOTE — Addendum Note (Signed)
Addended by: Minette Headland on: 10/26/2021 08:28 AM   Modules accepted: Orders

## 2021-10-26 NOTE — Progress Notes (Signed)
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    INTERVAL HISTORY: Patient returns to clinic today for further evaluation and to assess her toleration of cycle 1 of Taxotere, carboplatinum, Herceptin, and Perjeta.  She has had increased loose stools and diarrhea over the past week, but otherwise tolerated her treatment well without significant side effects.  Her surgical wound continues to heal.  She otherwise feels well.  She has no neurologic complaints.  She denies any recent fevers or illnesses.  She has a good appetite and denies weight loss.  She has no chest pain, shortness of breath, cough, or hemoptysis.  She denies any nausea, vomiting, or constipation.  She has no urinary complaints.  Patient offers no further specific complaints today.  REVIEW OF SYSTEMS:   Review of Systems  Constitutional: Negative.  Negative for fever, malaise/fatigue and weight loss.  Respiratory: Negative.  Negative for cough and shortness of breath.   Cardiovascular: Negative.  Negative for chest pain and leg swelling.  Gastrointestinal:  Positive for diarrhea. Negative for abdominal pain.  Genitourinary: Negative.  Negative for dysuria.  Musculoskeletal: Negative.  Negative for back pain.  Skin: Negative.  Negative for rash.  Neurological: Negative.  Negative for dizziness, focal weakness, weakness and headaches.  Psychiatric/Behavioral: Negative.  The patient is not nervous/anxious.    As per HPI. Otherwise, a complete review of systems is negative.  PAST MEDICAL HISTORY: Past Medical History:  Diagnosis Date   Anxiety     Aortic stenosis    Diabetes mellitus    Edema    Heart murmur    Hypertension    Sleep apnea    Thyroid disease     PAST SURGICAL HISTORY: Past Surgical History:  Procedure Laterality Date   ABDOMINAL HYSTERECTOMY  11/17/2013   BREAST BIOPSY     x2   BREAST BIOPSY Left 07/20/2021   [place] Bx, Ribbon Clip, Path pending   BREAST BIOPSY Left 07/20/2021   Stereo Bx, X-clip, path pending   BREAST BIOPSY Left 07/20/2021   Stereo biopsy, coil clip, path pending   BREAST BIOPSY Right 07/20/2021   Stereo Bx, Ribbon Clip, path pending   CESAREAN SECTION     x 2   CHOLECYSTECTOMY  12/17/2001   DUE TO STONES   DILATION AND CURETTAGE OF UTERUS     ELBOW SURGERY     POLYPECTOMY  12/17/1993   PORTACATH PLACEMENT Right 10/25/2021   Procedure: INSERTION PORT-A-CATH;  Surgeon: Ronny Bacon, MD;  Location: [facility];  Service: General;  Laterality: Right;   SIMPLE MASTECTOMY WITH AXILLARY SENTINEL NODE BIOPSY Left 08/16/2021   Procedure: SIMPLE MASTECTOMY WITH AXILLARY SENTINEL NODE BIOPSY;  Surgeon: Ronny Bacon, MD;  Location: [facility];  Service: General;  Laterality: Left;    FAMILY HISTORY: Family History  Problem Relation Age of Onset   Hyperlipidemia Mother    Thyroid disease Mother    Hashimoto's thyroiditis Mother    Heart disease Father        open heart surgery   Heart failure Father    Diabetes Father    Dementia Father    Thyroid disease Brother    Breast cancer Maternal  Grandmother    Diabetes Paternal Grandmother    Prostate cancer Paternal Grandfather     ADVANCED DIRECTIVES (Y/N):  N  HEALTH MAINTENANCE: Social History   Tobacco Use   Smoking status: Former    Packs/day: 1.50    Years: 25.00    Pack years: 37.50    Types: Cigarettes    Quit date: 11/24/2001    Years since quitting: 19.9   Smokeless tobacco: Never  Vaping Use   Vaping Use: Never used  Substance Use Topics   Alcohol use: Yes    Alcohol/week: 0.0 standard drinks    Comment:  OCCASIONALLY DRINKS WINE, ONCE A WEEK   Drug use: No     Colonoscopy:  PAP:  Bone density:  Lipid panel:  Allergies  Allergen Reactions   Fosaprepitant Nausea Only and Other (See Comments)    Back pain, nausea. See progress note from 10/27/2021     Current Outpatient Medications  Medication Sig Dispense Refill   Ascorbic Acid (VITAMIN C PO) Take 1 tablet by mouth daily.     aspirin 81 MG EC tablet Take 81 mg by mouth at bedtime.     carvedilol (COREG) 25 MG tablet Take 1 tablet (25 mg total) by mouth 2 (two) times daily. 180 tablet 1   Cholecalciferol (VITAMIN D3) 1000 UNITS CAPS Take 1,000 Units by mouth in the morning.     diphenoxylate-atropine (LOMOTIL) 2.5-0.025 MG tablet Take 2 tablets by mouth 4 (four) times daily as needed for diarrhea or loose stools. 60 tablet 1   Dulaglutide (TRULICITY) 3 BU/3.8GT SOPN Inject 3 mg as directed once a week. 2 mL 3   Ferrous Sulfate (IRON) 325 (65 Fe) MG TABS Take 1 tablet by mouth once daily with breakfast 90 tablet 0   furosemide (LASIX) 40 MG tablet TAKE 1 TABLET BY MOUTH ONCE DAILY AS NEEDED FOR FLUID OR EDEMA 90 tablet 1   glucose blood (CONTOUR NEXT TEST) test strip Test fasting sugar each morning. Recheck if having hypoglycemic symptoms. 100 each 4   glyBURIDE (DIABETA) 5 MG tablet TAKE 1 TABLET BY MOUTH TWICE DAILY WITH MEALS. 90 tablet 1   ibuprofen (ADVIL) 800 MG tablet Take 1 tablet (800 mg total) by mouth every 8 (eight) hours as needed. 30 tablet 0   levothyroxine (EUTHYROX) 75 MCG tablet TAKE 1 TABLET BY MOUTH ONCE DAILY BEFORE BREAKFAST . 90 tablet 1   lidocaine-prilocaine (EMLA) cream Apply to affected area once 30 g 3   lisinopril (ZESTRIL) 40 MG tablet Take 1 tablet (40 mg total) by mouth daily. 90 tablet 1   metFORMIN (GLUCOPHAGE) 1000 MG tablet Take 1 tablet (1,000 mg total) by mouth 2 (two) times daily with a meal. 180 tablet 1   Multiple Vitamin (MULTIVITAMIN) tablet Take 1 tablet by mouth every morning.     ondansetron  (ZOFRAN) 8 MG tablet Take 1 tablet (8 mg total) by mouth 2 (two) times daily as needed for refractory nausea / vomiting. 60 tablet 2   PARoxetine (PAXIL) 20 MG tablet Take 1 tablet (20 mg total) by mouth daily. 90 tablet 1   prochlorperazine (COMPAZINE) 10 MG tablet Take 1 tablet (10 mg total) by mouth every 6 (six) hours as needed (Nausea or vomiting). 60 tablet 2   zinc gluconate 50 MG tablet Take 50 mg by mouth daily.     hydrochlorothiazide (HYDRODIURIL) 25 MG tablet Take 1 tablet (25 mg total) by mouth daily. 90 tablet 1   No current facility-administered medications  for this visit.    OBJECTIVE: Vitals:   11/02/21 1004  BP: (!) 141/85  Pulse: 75  Resp: 16  Temp: 98.2 F (36.8 C)  SpO2: 99%     Body mass index is 31.17 kg/m.    ECOG FS:0 - Asymptomatic  General: Well-developed, well-nourished, no acute distress. Eyes: Pink conjunctiva, anicteric sclera. HEENT: Normocephalic, moist mucous membranes. Lungs: No audible wheezing or coughing. Heart: Regular rate and rhythm. Abdomen: Soft, nontender, no obvious distention. Musculoskeletal: No edema, cyanosis, or clubbing. Neuro: Alert, answering all questions appropriately. Cranial nerves grossly intact. Skin: No rashes or petechiae noted. Psych: Normal affect.    LAB RESULTS:  Lab Results  Component Value Date   NA 135 11/02/2021   K 3.5 11/02/2021   CL 97 (L) 11/02/2021   CO2 30 11/02/2021   GLUCOSE 199 (H) 11/02/2021   BUN 11 11/02/2021   CREATININE 0.59 11/02/2021   CALCIUM 8.4 (L) 11/02/2021   PROT 7.2 11/02/2021   ALBUMIN 4.2 11/02/2021   AST 36 11/02/2021   ALT 48 (H) 11/02/2021   ALKPHOS 61 11/02/2021   BILITOT 0.6 11/02/2021   GFRNONAA >60 11/02/2021   GFRAA 111 10/14/2020    Lab Results  Component Value Date   WBC 3.5 (L) 11/02/2021   NEUTROABS 1.2 (L) 11/02/2021   HGB 11.4 (L) 11/02/2021   HCT 36.4 11/02/2021   MCV 81.4 11/02/2021   PLT 103 (L) 11/02/2021     STUDIES: DG Chest Port 1  View  Result Date: 10/25/2021 CLINICAL DATA:  Central venous catheter placement EXAM: PORTABLE CHEST 1 VIEW COMPARISON:  01/02/2020 FINDINGS: A power injectable right IJ Port-A-Cath is noted with its tip projecting over the SVC. No pneumothorax or complicating feature. Small clips project over the left axilla. The lungs appear clear. Heart size within normal limits for projection. Atherosclerotic calcification of the aortic arch. IMPRESSION: 1. Right IJ power injectable Port-A-Cath tip: SVC. No pneumothorax or complicating feature identified. 2.  Aortic Atherosclerosis (ICD10-I70.0). Electronically Signed   By: Van Clines M.D.   On: 10/25/2021 13:53   DG C-Arm 1-60 Min-No Report  Result Date: 10/25/2021 Fluoroscopy was utilized by the requesting physician.  No radiographic interpretation.    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  Previously, right breast biopsy was negative for malignancy.  Given stage and HER2 status of disease, patient will require adjuvant treatment using Taxotere, carboplatinum, Perjeta, and Herceptin every 3 weeks for 6 cycles with Udenyca support and then maintenance Herceptin and Perjeta every 3 weeks for an entire year.  She will also benefit from letrozole for 5 years at the conclusion of all of her treatments.  MUGA scan from September 21, 2021 revealed a EF of 69.3%.  Patient has had port placement.  Patient tolerated cycle 1 relatively well only with some increased diarrhea.  Return to clinic in 2 weeks as previously scheduled for further evaluation and consideration of cycle 2.  Of note, patient has laboratory work and evaluation on day 1 and treatment on cycle 2 of chemotherapy on day 2.   Mastectomy  wound: Continue follow-up with surgery as scheduled.  Proceed cautiously with treatment as above. Diarrhea: Patient was given a prescription for Lomotil. Leukopenia: Udenyca as  above. Thrombocytopenia: Secondary to chemotherapy, monitor.   Patient expressed understanding and was in agreement with this plan. She also understands that She can call clinic at any time with any questions, concerns, or complaints.    Cancer Staging  Carcinoma of left breast [facility]) Staging form: Breast, AJCC 8th Edition - Pathologic stage from 08/30/2021: Stage IB (pT2, pN1a, cM0, G3, ER+, PR+, HER2+, Oncotype DX score: 18) - Signed by Lloyd Huger, MD on 09/13/2021 Stage prefix: Initial diagnosis Multigene prognostic tests performed: Oncotype DX Recurrence score range: Greater than or equal to 11 Histologic grading system: 3 grade system  Lloyd Huger, MD   11/03/2021 9:45 AM

## 2021-10-26 NOTE — Progress Notes (Signed)
Pt will like to discuss EMLA cream and her port.

## 2021-10-27 ENCOUNTER — Inpatient Hospital Stay: Payer: 59

## 2021-10-27 ENCOUNTER — Other Ambulatory Visit: Payer: Self-pay

## 2021-10-27 ENCOUNTER — Other Ambulatory Visit: Payer: Self-pay | Admitting: Oncology

## 2021-10-27 VITALS — BP 106/63 | HR 74 | Temp 98.4°F | Resp 20 | Wt 186.0 lb

## 2021-10-27 DIAGNOSIS — C50812 Malignant neoplasm of overlapping sites of left female breast: Secondary | ICD-10-CM | POA: Diagnosis not present

## 2021-10-27 DIAGNOSIS — Z170 Estrogen receptor positive status [ER+]: Secondary | ICD-10-CM

## 2021-10-27 MED ORDER — FAMOTIDINE 20 MG IN NS 100 ML IVPB
20.0000 mg | Freq: Once | INTRAVENOUS | Status: AC
  Administered 2021-10-27: 20 mg via INTRAVENOUS
  Filled 2021-10-27: qty 20

## 2021-10-27 MED ORDER — PALONOSETRON HCL INJECTION 0.25 MG/5ML
0.2500 mg | Freq: Once | INTRAVENOUS | Status: AC
  Administered 2021-10-27: 0.25 mg via INTRAVENOUS
  Filled 2021-10-27: qty 5

## 2021-10-27 MED ORDER — SODIUM CHLORIDE 0.9% FLUSH
10.0000 mL | INTRAVENOUS | Status: DC | PRN
  Filled 2021-10-27: qty 10

## 2021-10-27 MED ORDER — HEPARIN SOD (PORK) LOCK FLUSH 100 UNIT/ML IV SOLN
500.0000 [IU] | Freq: Once | INTRAVENOUS | Status: DC | PRN
  Filled 2021-10-27: qty 5

## 2021-10-27 MED ORDER — TRASTUZUMAB-ANNS CHEMO 150 MG IV SOLR
8.0000 mg/kg | Freq: Once | INTRAVENOUS | Status: AC
  Administered 2021-10-27: 672 mg via INTRAVENOUS
  Filled 2021-10-27: qty 32

## 2021-10-27 MED ORDER — SODIUM CHLORIDE 0.9 % IV SOLN
840.0000 mg | Freq: Once | INTRAVENOUS | Status: AC
  Administered 2021-10-27: 840 mg via INTRAVENOUS
  Filled 2021-10-27: qty 28

## 2021-10-27 MED ORDER — LORAZEPAM 2 MG/ML IJ SOLN
0.5000 mg | Freq: Once | INTRAMUSCULAR | Status: AC
  Administered 2021-10-27: 0.5 mg via INTRAVENOUS
  Filled 2021-10-27: qty 1

## 2021-10-27 MED ORDER — SODIUM CHLORIDE 0.9 % IV SOLN
Freq: Once | INTRAVENOUS | Status: AC
  Filled 2021-10-27: qty 250

## 2021-10-27 MED ORDER — SODIUM CHLORIDE 0.9 % IV SOLN
Freq: Once | INTRAVENOUS | Status: DC | PRN
  Filled 2021-10-27: qty 250

## 2021-10-27 MED ORDER — SODIUM CHLORIDE 0.9 % IV SOLN
10.0000 mg | Freq: Once | INTRAVENOUS | Status: AC
  Administered 2021-10-27: 10 mg via INTRAVENOUS
  Filled 2021-10-27: qty 10

## 2021-10-27 MED ORDER — SODIUM CHLORIDE 0.9 % IV SOLN
75.0000 mg/m2 | Freq: Once | INTRAVENOUS | Status: AC
  Administered 2021-10-27: 150 mg via INTRAVENOUS
  Filled 2021-10-27: qty 15

## 2021-10-27 MED ORDER — SODIUM CHLORIDE 0.9 % IV SOLN
150.0000 mg | Freq: Once | INTRAVENOUS | Status: AC
  Administered 2021-10-27: 150 mg via INTRAVENOUS
  Filled 2021-10-27: qty 150

## 2021-10-27 MED ORDER — ACETAMINOPHEN 325 MG PO TABS
650.0000 mg | ORAL_TABLET | Freq: Once | ORAL | Status: AC
  Administered 2021-10-27: 650 mg via ORAL
  Filled 2021-10-27: qty 2

## 2021-10-27 MED ORDER — HEPARIN SOD (PORK) LOCK FLUSH 100 UNIT/ML IV SOLN
INTRAVENOUS | Status: AC
  Administered 2021-10-27: 500 [IU]
  Filled 2021-10-27: qty 5

## 2021-10-27 MED ORDER — DIPHENHYDRAMINE HCL 25 MG PO CAPS
25.0000 mg | ORAL_CAPSULE | Freq: Once | ORAL | Status: AC
  Administered 2021-10-27: 25 mg via ORAL
  Filled 2021-10-27: qty 1

## 2021-10-27 MED ORDER — SODIUM CHLORIDE 0.9 % IV SOLN
739.2000 mg | Freq: Once | INTRAVENOUS | Status: AC
  Administered 2021-10-27: 740 mg via INTRAVENOUS
  Filled 2021-10-27: qty 74

## 2021-10-27 NOTE — Patient Instructions (Signed)
[facility]  Discharge Instructions: Thank you for choosing [facility] to provide your oncology and hematology care.  If you have a lab appointment with the [facility], please go directly to the [facility] and check in at the registration area.  Wear comfortable clothing and clothing appropriate for easy access to any Portacath or PICC line.   We strive to give you quality time with your provider. You may need to reschedule your appointment if you arrive late (15 or more minutes).  Arriving late affects you and other patients whose appointments are after yours.  Also, if you miss three or more appointments without notifying the office, you may be dismissed from the clinic at the provider's discretion.      For prescription refill requests, have your pharmacy contact our office and allow 72 hours for refills to be completed.    Today you received the following chemotherapy and/or immunotherapy agents: Herceptin, Perjeta, Taxotere, Carboplatin      To help prevent nausea and vomiting after your treatment, we encourage you to take your nausea medication as directed.  BELOW ARE SYMPTOMS THAT SHOULD BE REPORTED IMMEDIATELY: *FEVER GREATER THAN 100.4 F (38 C) OR HIGHER *CHILLS OR SWEATING *NAUSEA AND VOMITING THAT IS NOT CONTROLLED WITH YOUR NAUSEA MEDICATION *UNUSUAL SHORTNESS OF BREATH *UNUSUAL BRUISING OR BLEEDING *URINARY PROBLEMS (pain or burning when urinating, or frequent urination) *BOWEL PROBLEMS (unusual diarrhea, constipation, pain near the anus) TENDERNESS IN MOUTH AND THROAT WITH OR WITHOUT PRESENCE OF ULCERS (sore throat, sores in mouth, or a toothache) UNUSUAL RASH, SWELLING OR PAIN  UNUSUAL VAGINAL DISCHARGE OR ITCHING   Items with * indicate a potential emergency and should be followed up as soon as possible or go to the Emergency Department if any problems should occur.  Please show the CHEMOTHERAPY ALERT CARD or  IMMUNOTHERAPY ALERT CARD at check-in to the Emergency Department and triage nurse.  Should you have questions after your visit or need to cancel or reschedule your appointment, please contact [facility]  [phone] and follow the prompts.  Office hours are 8:00 a.m. to 4:30 p.m. Monday - Friday. Please note that voicemails left after 4:00 p.m. may not be returned until the following business day.  We are closed weekends and major holidays. You have access to a nurse at all times for urgent questions. Please call the main number to the clinic [phone] and follow the prompts.  For any non-urgent questions, you may also contact your provider using MyChart. We now offer e-Visits for anyone 70 and older to request care online for non-urgent symptoms. For details visit [email or web address].   Also download the MyChart app! Go to the app store, search "MyChart", open the app, select [facility], and log in with your MyChart username and password.  Due to Covid, a mask is required upon entering the hospital/clinic. If you do not have a mask, one will be given to you upon arrival. For doctor visits, patients may have 1 support person aged 18 or older with them. For treatment visits, patients cannot have anyone with them due to current Covid guidelines and our immunocompromised population. Pertuzumab injection What is this medication? PERTUZUMAB (per TOOZ ue mab) is a monoclonal antibody. It is used to treat breast cancer. This medicine may be used for other purposes; ask your health care provider or pharmacist if you have questions. COMMON BRAND NAME(S): PERJETA What should I tell my care team before I  take this medication? They need to know if you have any of these conditions: heart disease heart failure high blood pressure history of irregular heart beat recent or ongoing radiation therapy an unusual or allergic reaction to pertuzumab, other medicines,  foods, dyes, or preservatives pregnant or trying to get pregnant breast-feeding How should I use this medication? This medicine is for infusion into a vein. It is given by a health care professional in a hospital or clinic setting. Talk to your pediatrician regarding the use of this medicine in children. Special care may be needed. Overdosage: If you think you have taken too much of this medicine contact a poison control center or emergency room at once. NOTE: This medicine is only for you. Do not share this medicine with others. What if I miss a dose? It is important not to miss your dose. Call your doctor or health care professional if you are unable to keep an appointment. What may interact with this medication? Interactions are not expected. Give your health care provider a list of all the medicines, herbs, non-prescription drugs, or dietary supplements you use. Also tell them if you smoke, drink alcohol, or use illegal drugs. Some items may interact with your medicine. This list may not describe all possible interactions. Give your health care provider a list of all the medicines, herbs, non-prescription drugs, or dietary supplements you use. Also tell them if you smoke, drink alcohol, or use illegal drugs. Some items may interact with your medicine. What should I watch for while using this medication? Your condition will be monitored carefully while you are receiving this medicine. Report any side effects. Continue your course of treatment even though you feel ill unless your doctor tells you to stop. Do not become pregnant while taking this medicine or for 7 months after stopping it. Women should inform their doctor if they wish to become pregnant or think they might be pregnant. Women of child-bearing potential will need to have a negative pregnancy test before starting this medicine. There is a potential for serious side effects to an unborn child. Talk to your health care professional or  pharmacist for more information. Do not breast-feed an infant while taking this medicine or for 7 months after stopping it. Women must use effective birth control with this medicine. Call your doctor or health care professional for advice if you get a fever, chills or sore throat, or other symptoms of a cold or flu. Do not treat yourself. Try to avoid being around people who are sick. You may experience fever, chills, and headache during the infusion. Report any side effects during the infusion to your health care professional. What side effects may I notice from receiving this medication? Side effects that you should report to your doctor or health care professional as soon as possible: breathing problems chest pain or palpitations dizziness feeling faint or lightheaded fever or chills skin rash, itching or hives sore throat swelling of the face, lips, or tongue swelling of the legs or ankles unusually weak or tired Side effects that usually do not require medical attention (report to your doctor or health care professional if they continue or are bothersome): diarrhea hair loss nausea, vomiting tiredness This list may not describe all possible side effects. Call your doctor for medical advice about side effects. You may report side effects to FDA at 1-800-FDA-1088. Where should I keep my medication? This drug is given in a hospital or clinic and will not be stored at home. NOTE:  This sheet is a summary. It may not cover all possible information. If you have questions about this medicine, talk to your doctor, pharmacist, or health care provider.  2022 Elsevier/Gold Standard (2016-01-05 00:00:00) Trastuzumab injection for infusion What is this medication? TRASTUZUMAB (tras TOO zoo mab) is a monoclonal antibody. It is used to treat breast cancer and stomach cancer. This medicine may be used for other purposes; ask your health care provider or pharmacist if you have questions. COMMON  BRAND NAME(S): Herceptin, Janae Bridgeman, Ontruzant, Trazimera What should I tell my care team before I take this medication? They need to know if you have any of these conditions: heart disease heart failure lung or breathing disease, like asthma an unusual or allergic reaction to trastuzumab, benzyl alcohol, or other medications, foods, dyes, or preservatives pregnant or trying to get pregnant breast-feeding How should I use this medication? This drug is given as an infusion into a vein. It is administered in a hospital or clinic by a specially trained health care professional. Talk to your pediatrician regarding the use of this medicine in children. This medicine is not approved for use in children. Overdosage: If you think you have taken too much of this medicine contact a poison control center or emergency room at once. NOTE: This medicine is only for you. Do not share this medicine with others. What if I miss a dose? It is important not to miss a dose. Call your doctor or health care professional if you are unable to keep an appointment. What may interact with this medication? This medicine may interact with the following medications: certain types of chemotherapy, such as daunorubicin, doxorubicin, epirubicin, and idarubicin This list may not describe all possible interactions. Give your health care provider a list of all the medicines, herbs, non-prescription drugs, or dietary supplements you use. Also tell them if you smoke, drink alcohol, or use illegal drugs. Some items may interact with your medicine. What should I watch for while using this medication? Visit your doctor for checks on your progress. Report any side effects. Continue your course of treatment even though you feel ill unless your doctor tells you to stop. Call your doctor or health care professional for advice if you get a fever, chills or sore throat, or other symptoms of a cold or flu. Do not treat  yourself. Try to avoid being around people who are sick. You may experience fever, chills and shaking during your first infusion. These effects are usually mild and can be treated with other medicines. Report any side effects during the infusion to your health care professional. Fever and chills usually do not happen with later infusions. Do not become pregnant while taking this medicine or for 7 months after stopping it. Women should inform their doctor if they wish to become pregnant or think they might be pregnant. Women of child-bearing potential will need to have a negative pregnancy test before starting this medicine. There is a potential for serious side effects to an unborn child. Talk to your health care professional or pharmacist for more information. Do not breast-feed an infant while taking this medicine or for 7 months after stopping it. Women must use effective birth control with this medicine. What side effects may I notice from receiving this medication? Side effects that you should report to your doctor or health care professional as soon as possible: allergic reactions like skin rash, itching or hives, swelling of the face, lips, or tongue chest pain or palpitations cough  dizziness feeling faint or lightheaded, falls fever general ill feeling or flu-like symptoms signs of worsening heart failure like breathing problems; swelling in your legs and feet unusually weak or tired Side effects that usually do not require medical attention (report to your doctor or health care professional if they continue or are bothersome): bone pain changes in taste diarrhea joint pain nausea/vomiting weight loss This list may not describe all possible side effects. Call your doctor for medical advice about side effects. You may report side effects to FDA at 1-800-FDA-1088. Where should I keep my medication? This drug is given in a hospital or clinic and will not be stored at home. NOTE: This  sheet is a summary. It may not cover all possible information. If you have questions about this medicine, talk to your doctor, pharmacist, or health care provider.  2022 Elsevier/Gold Standard (2016-12-18 00:00:00) Carboplatin injection What is this medication? CARBOPLATIN (KAR boe pla tin) is a chemotherapy drug. It targets fast dividing cells, like cancer cells, and causes these cells to die. This medicine is used to treat ovarian cancer and many other cancers. This medicine may be used for other purposes; ask your health care provider or pharmacist if you have questions. COMMON BRAND NAME(S): Paraplatin What should I tell my care team before I take this medication? They need to know if you have any of these conditions: blood disorders hearing problems kidney disease recent or ongoing radiation therapy an unusual or allergic reaction to carboplatin, cisplatin, other chemotherapy, other medicines, foods, dyes, or preservatives pregnant or trying to get pregnant breast-feeding How should I use this medication? This drug is usually given as an infusion into a vein. It is administered in a hospital or clinic by a specially trained health care professional. Talk to your pediatrician regarding the use of this medicine in children. Special care may be needed. Overdosage: If you think you have taken too much of this medicine contact a poison control center or emergency room at once. NOTE: This medicine is only for you. Do not share this medicine with others. What if I miss a dose? It is important not to miss a dose. Call your doctor or health care professional if you are unable to keep an appointment. What may interact with this medication? medicines for seizures medicines to increase blood counts like filgrastim, pegfilgrastim, sargramostim some antibiotics like amikacin, gentamicin, neomycin, streptomycin, tobramycin vaccines Talk to your doctor or health care professional before taking any  of these medicines: acetaminophen aspirin ibuprofen ketoprofen naproxen This list may not describe all possible interactions. Give your health care provider a list of all the medicines, herbs, non-prescription drugs, or dietary supplements you use. Also tell them if you smoke, drink alcohol, or use illegal drugs. Some items may interact with your medicine. What should I watch for while using this medication? Your condition will be monitored carefully while you are receiving this medicine. You will need important blood work done while you are taking this medicine. This drug may make you feel generally unwell. This is not uncommon, as chemotherapy can affect healthy cells as well as cancer cells. Report any side effects. Continue your course of treatment even though you feel ill unless your doctor tells you to stop. In some cases, you may be given additional medicines to help with side effects. Follow all directions for their use. Call your doctor or health care professional for advice if you get a fever, chills or sore throat, or other symptoms of a cold  or flu. Do not treat yourself. This drug decreases your body's ability to fight infections. Try to avoid being around people who are sick. This medicine may increase your risk to bruise or bleed. Call your doctor or health care professional if you notice any unusual bleeding. Be careful brushing and flossing your teeth or using a toothpick because you may get an infection or bleed more easily. If you have any dental work done, tell your dentist you are receiving this medicine. Avoid taking products that contain aspirin, acetaminophen, ibuprofen, naproxen, or ketoprofen unless instructed by your doctor. These medicines may hide a fever. Do not become pregnant while taking this medicine. Women should inform their doctor if they wish to become pregnant or think they might be pregnant. There is a potential for serious side effects to an unborn child. Talk  to your health care professional or pharmacist for more information. Do not breast-feed an infant while taking this medicine. What side effects may I notice from receiving this medication? Side effects that you should report to your doctor or health care professional as soon as possible: allergic reactions like skin rash, itching or hives, swelling of the face, lips, or tongue signs of infection - fever or chills, cough, sore throat, pain or difficulty passing urine signs of decreased platelets or bleeding - bruising, pinpoint red spots on the skin, black, tarry stools, nosebleeds signs of decreased red blood cells - unusually weak or tired, fainting spells, lightheadedness breathing problems changes in hearing changes in vision chest pain high blood pressure low blood counts - This drug may decrease the number of white blood cells, red blood cells and platelets. You may be at increased risk for infections and bleeding. nausea and vomiting pain, swelling, redness or irritation at the injection site pain, tingling, numbness in the hands or feet problems with balance, talking, walking trouble passing urine or change in the amount of urine Side effects that usually do not require medical attention (report to your doctor or health care professional if they continue or are bothersome): hair loss loss of appetite metallic taste in the mouth or changes in taste This list may not describe all possible side effects. Call your doctor for medical advice about side effects. You may report side effects to FDA at 1-800-FDA-1088. Where should I keep my medication? This drug is given in a hospital or clinic and will not be stored at home. NOTE: This sheet is a summary. It may not cover all possible information. If you have questions about this medicine, talk to your doctor, pharmacist, or health care provider.  2022 Elsevier/Gold Standard (2008-05-12 00:00:00) Docetaxel injection What is this  medication? DOCETAXEL (doe se TAX el) is a chemotherapy drug. It targets fast dividing cells, like cancer cells, and causes these cells to die. This medicine is used to treat many types of cancers like breast cancer, certain stomach cancers, head and neck cancer, lung cancer, and prostate cancer. This medicine may be used for other purposes; ask your health care provider or pharmacist if you have questions. COMMON BRAND NAME(S): Docefrez, Taxotere What should I tell my care team before I take this medication? They need to know if you have any of these conditions: infection (especially a virus infection such as chickenpox, cold sores, or herpes) liver disease low blood counts, like low white cell, platelet, or red cell counts an unusual or allergic reaction to docetaxel, polysorbate 80, other chemotherapy agents, other medicines, foods, dyes, or preservatives pregnant or trying to  get pregnant breast-feeding How should I use this medication? This drug is given as an infusion into a vein. It is administered in a hospital or clinic by a specially trained health care professional. Talk to your pediatrician regarding the use of this medicine in children. Special care may be needed. Overdosage: If you think you have taken too much of this medicine contact a poison control center or emergency room at once. NOTE: This medicine is only for you. Do not share this medicine with others. What if I miss a dose? It is important not to miss your dose. Call your doctor or health care professional if you are unable to keep an appointment. What may interact with this medication? Do not take this medicine with any of the following medications: live virus vaccines This medicine may also interact with the following medications: aprepitant certain antibiotics like erythromycin or clarithromycin certain antivirals for HIV or hepatitis certain medicines for fungal infections like fluconazole, itraconazole,  ketoconazole, posaconazole, or voriconazole cimetidine ciprofloxacin conivaptan cyclosporine dronedarone fluvoxamine grapefruit juice imatinib verapamil This list may not describe all possible interactions. Give your health care provider a list of all the medicines, herbs, non-prescription drugs, or dietary supplements you use. Also tell them if you smoke, drink alcohol, or use illegal drugs. Some items may interact with your medicine. What should I watch for while using this medication? Your condition will be monitored carefully while you are receiving this medicine. You will need important blood work done while you are taking this medicine. Call your doctor or health care professional for advice if you get a fever, chills or sore throat, or other symptoms of a cold or flu. Do not treat yourself. This drug decreases your body's ability to fight infections. Try to avoid being around people who are sick. Some products may contain alcohol. Ask your health care professional if this medicine contains alcohol. Be sure to tell all health care professionals you are taking this medicine. Certain medicines, like metronidazole and disulfiram, can cause an unpleasant reaction when taken with alcohol. The reaction includes flushing, headache, nausea, vomiting, sweating, and increased thirst. The reaction can last from 30 minutes to several hours. You may get drowsy or dizzy. Do not drive, use machinery, or do anything that needs mental alertness until you know how this medicine affects you. Do not stand or sit up quickly, especially if you are an older patient. This reduces the risk of dizzy or fainting spells. Alcohol may interfere with the effect of this medicine. Talk to your health care professional about your risk of cancer. You may be more at risk for certain types of cancer if you take this medicine. Do not become pregnant while taking this medicine or for 6 months after stopping it. Women should inform  their doctor if they wish to become pregnant or think they might be pregnant. There is a potential for serious side effects to an unborn child. Talk to your health care professional or pharmacist for more information. Do not breast-feed an infant while taking this medicine or for 1 week after stopping it. Males who get this medicine must use a condom during sex with females who can get pregnant. If you get a woman pregnant, the baby could have birth defects. The baby could die before they are born. You will need to continue wearing a condom for 3 months after stopping the medicine. Tell your health care provider right away if your partner becomes pregnant while you are taking this medicine.  This may interfere with the ability to father a child. You should talk to your doctor or health care professional if you are concerned about your fertility. What side effects may I notice from receiving this medication? Side effects that you should report to your doctor or health care professional as soon as possible: allergic reactions like skin rash, itching or hives, swelling of the face, lips, or tongue blurred vision breathing problems changes in vision low blood counts - This drug may decrease the number of white blood cells, red blood cells and platelets. You may be at increased risk for infections and bleeding. nausea and vomiting pain, redness or irritation at site where injected pain, tingling, numbness in the hands or feet redness, blistering, peeling, or loosening of the skin, including inside the mouth signs of decreased platelets or bleeding - bruising, pinpoint red spots on the skin, black, tarry stools, nosebleeds signs of decreased red blood cells - unusually weak or tired, fainting spells, lightheadedness signs of infection - fever or chills, cough, sore throat, pain or difficulty passing urine swelling of the ankle, feet, hands Side effects that usually do not require medical attention (report  to your doctor or health care professional if they continue or are bothersome): constipation diarrhea fingernail or toenail changes hair loss loss of appetite mouth sores muscle pain This list may not describe all possible side effects. Call your doctor for medical advice about side effects. You may report side effects to FDA at 1-800-FDA-1088. Where should I keep my medication? This drug is given in a hospital or clinic and will not be stored at home. NOTE: This sheet is a summary. It may not cover all possible information. If you have questions about this medicine, talk to your doctor, pharmacist, or health care provider.  2022 Elsevier/Gold Standard (2021-08-22 00:00:00)

## 2021-10-27 NOTE — Progress Notes (Signed)
Hypersensitivity Reaction note  Date of event: 10/27/21 Time of event: 0924 Generic name of drug involved: fosaprepitant Name of provider notified of the hypersensitivity reaction: Beckey Rutter NP Was agent that likely caused hypersensitivity reaction added to Allergies List within EMR? yes Chain of events including reaction signs/symptoms, treatment administered, and outcome (e.g., drug resumed; drug discontinued; sent to Emergency Department; etc.)   Dexamethasone and fosaprepitant started  at [place] patient complaining of nausea, pale,. Throat felt tight with a "glob" stuck in it. Medications stopped, VS taken (see flowsheet). Beckey Rutter NP notified and chairside at [phone] Orders for 0.5 IV Ativan x1 and 571ml NS bolus given. Pepcid also given  1018 VS stable, patient reports symptoms subsided. Restarted fosaprepitant. 1019-patient remembers that her back started hurting the first time we started med and that it is hurting again. Medication stopped, NS restarted. Back pain resolved immedately after stopping medication.  Beckey Rutter NP notified. Orders to hold medication. Patient able to finish dexamethasone without any issues.    Emily Cancel, RN 10/27/2021 10:43 AM

## 2021-10-29 ENCOUNTER — Encounter: Payer: Self-pay | Admitting: Oncology

## 2021-10-30 ENCOUNTER — Other Ambulatory Visit: Payer: Self-pay

## 2021-10-30 ENCOUNTER — Telehealth: Payer: Self-pay

## 2021-10-30 ENCOUNTER — Ambulatory Visit: Payer: 59 | Admitting: Occupational Therapy

## 2021-10-30 ENCOUNTER — Inpatient Hospital Stay: Payer: 59

## 2021-10-30 DIAGNOSIS — M25612 Stiffness of left shoulder, not elsewhere classified: Secondary | ICD-10-CM

## 2021-10-30 DIAGNOSIS — C50812 Malignant neoplasm of overlapping sites of left female breast: Secondary | ICD-10-CM

## 2021-10-30 DIAGNOSIS — L905 Scar conditions and fibrosis of skin: Secondary | ICD-10-CM

## 2021-10-30 DIAGNOSIS — Z170 Estrogen receptor positive status [ER+]: Secondary | ICD-10-CM

## 2021-10-30 MED ORDER — PEGFILGRASTIM-CBQV 6 MG/0.6ML ~~LOC~~ SOSY
6.0000 mg | PREFILLED_SYRINGE | Freq: Once | SUBCUTANEOUS | Status: AC
  Administered 2021-10-30: 6 mg via SUBCUTANEOUS
  Filled 2021-10-30: qty 0.6

## 2021-10-30 NOTE — Telephone Encounter (Signed)
Telephone call to patient for follow up after receiving first infusion.   Patient states had a reaction to the premeds but other than that the infusion went great.   Denies any nausea or vomiting.  C/O diarrhea yesterday and 3 loose stools today.  Encouraged pt to take Imodium AD if more than 3 loose stools in one day.   Encouraged patient to call for any concerns or questions.

## 2021-10-30 NOTE — Therapy (Signed)
[facility] [address], [place], [address] Phone: [phone]   Fax:  [phone]  Occupational Therapy Screen  Patient Details  Name: Emily Tapia MRN: [id number] Date of Birth: 01/15/1961 No data recorded  Encounter Date: 10/30/2021   OT End of Session - 10/30/21 1752     Visit Number 0             Past Medical History:  Diagnosis Date   Anxiety    Aortic stenosis    Diabetes mellitus    Edema    Heart murmur    Hypertension    Sleep apnea    Thyroid disease     Past Surgical History:  Procedure Laterality Date   ABDOMINAL HYSTERECTOMY  11/17/2013   BREAST BIOPSY     x2   BREAST BIOPSY Left 07/20/2021   [place] Bx, Ribbon Clip, Path pending   BREAST BIOPSY Left 07/20/2021   Stereo Bx, X-clip, path pending   BREAST BIOPSY Left 07/20/2021   Stereo biopsy, coil clip, path pending   BREAST BIOPSY Right 07/20/2021   Stereo Bx, Ribbon Clip, path pending   CESAREAN SECTION     x 2   CHOLECYSTECTOMY  12/17/2001   DUE TO STONES   DILATION AND CURETTAGE OF UTERUS     ELBOW SURGERY     POLYPECTOMY  12/17/1993   PORTACATH PLACEMENT Right 10/25/2021   Procedure: INSERTION PORT-A-CATH;  Surgeon: Ronny Bacon, MD;  Location: [facility] ORS;  Service: General;  Laterality: Right;   SIMPLE MASTECTOMY WITH AXILLARY SENTINEL NODE BIOPSY Left 08/16/2021   Procedure: SIMPLE MASTECTOMY WITH AXILLARY SENTINEL NODE BIOPSY;  Surgeon: Ronny Bacon, MD;  Location: [facility] ORS;  Service: General;  Laterality: Left;    There were no vitals filed for this visit.   Subjective Assessment - 10/30/21 1751     Subjective  I got my chemo port and started last Friday chemo - arm doing okay- able to reach over head and back with less of pull -still little discomfort in my shoulder but all the time    Currently in Pain? Yes    Pain Score 1     Pain Location Arm    Pain Orientation Left    Pain Descriptors /  Indicators Aching    Pain Type Surgical pain    Pain Onset More than a month ago    Pain Frequency Occasional                      OT SCREEN 09/13/21: Pt arrive with husband - still drain in and on antibiotics. R hand dominant - and follow up appt with surgeon next week. Swelling under L axilla and drain in place, and bandage. Pt show decrease shoulder over head flexion, ABD and ext rotation - did not do any exercises or ROM because of drain. Pt report was told today that she will have probably radiation in future. Pt was ed on lymphedema signs and symptoms ,prevention and treatment. Hand out provided. Base measurements taken this date - see circumference. WNL at this time Pt to follow up with me again in 3 wks    09/26/21 DR Christian Mate NOTE:     Ms. Mihalic returns today, she is noted more serous soupy drainage from her eschar.  She otherwise looks great, her drain site is healing well and is clean.  There is no areas of fluctuance consistent with residual seroma.  She does  note some twinges of tightness feeling in her left pectoral deltoid region, likely due to not working on her range of motion.   The eschar of the medial most aspect of her left chest wall incision from her mastectomy is a very well demarcated, and we will proceed with excision of the eschar today and initiate open wound care.   With informed consent we placed her in a supine position.  I prepped the area with Betadine and we used a fenestrated drape.  With sterile technique I proceeded with sharp excision of the eschar which was markedly undergone liquefactive necrosis in 1 area, and very dry over the lower perimeter.  I was able to define a good line of division excising away of this necrotic tissue from the adjacent viable tissue.  She tolerated this very well without any additional local anesthetic.  Hemostasis was also easily controlled.  Is allowed drainage of the underlying soft tissues.  I marked out this  cavity with dry gauze, and subsequently packed it moistened it with normal saline solution.   We gave instructions for local wound care to be done once to twice daily as needed.  I believe she will initiate saline dressing changes and follow-up in a week to 10 days to evaluate the progress of her wound.   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-  She arrive this date with some reports of pull from her armpit into her elbow and forearm - appear pt have some cording - release  it in axilla with less pull in upper arm but still continues into anterior elbow to forearm - but unable to release further - painfull - will check on it again in 2 wks Pt's L upper arm circumference increase by 1 cm this date compare to L - will monitor- appt with surgeon on 20th check up on would care- Pt can do some below 90 degrees for shoulder and elbow AROM glides -and ask surgeon if can do supine - over head AAROM shoulder flexion and ABD  Follow up in 2 wks again    OT SCREEN 09/17/21: Pt report she is working 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 - desk little high - and feet on foot stool already Wound still not closed on L chest - keeping bandage on it and cleaning it Report this date still pull inside of arm from elbow into forearm - upper arm better - pt with cording - release  it in axilla  last time with less pull in upper arm but still continues into anterior elbow to forearm - this date did get on release at elbow - will check on it again in 2 wks Pt's L upper arm circumference  compare to R UE WNL - will monitor- appt with surgeon  tomorrow again  Did get verbal order from surgeon to do over head ROM -  Initiated this date shoulder flexion and  horizontal ABD in supine using golf club - as well as ext rotation over head in supine   12 reps - add to HEP  As well as massage by husband L upper traps -and do cervical lateral flexion to the R - 10  reps Shoulder flexion 135 - and ABD compensate into some flexion- pain with ext rotation to back of head and int rotation lower back  Follow up in 2 wks again      DR [facility] 10/26/21 ASSESSMENT: 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  Previously, right breast biopsy was negative for malignancy.  Given stage and HER2 status of disease, patient will require adjuvant treatment using Taxotere, carboplatinum, Perjeta, and Herceptin every 3 weeks for 6 cycles with Udenyca support and then maintenance Herceptin and Perjeta every 3 weeks for an entire year.  She will also benefit from letrozole for 5 years at the conclusion of all of her treatments.  MUGA scan from September 21, 2021 revealed a EF of 69.3%.  Patient has had port placement.  Return to clinic tomorrow for cycle 1 of treatment.  Patient will then return to clinic in 1 week for laboratory work and further evaluation and then in 3 weeks for laboratory work and evaluation on day 1 and cycle 2 of chemotherapy on day 2.   Mastectomy wound: Continue follow-up with surgery as scheduled.  Proceed cautiously with treatment as above   OT SCREEN 10/30/21: Pt  started chemo this past Friday -and still working 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 - desk little high - and feet on foot stool already Wound  closing slowly - small area open on medial side- keeping bandage on it and cleaning it Report this date that her ROM in L arm is so much better the pulling in the arm - nothing in the upper arm - little  bit distal to elbow  Done some myofascial release on elbow into forearm -had 3 releases after few attempts with less cording in forearm in ABD Pt's L upper arm circumference  compare to R UE WNL lat time- will monitor- appt with surgeon  tomorrow again  Shoulder flexion and ABD 155 this date -and ext rotation [facility] - little tight still in ext rotation end range  And tight in upper traps on L  Recieved verbal order from surgeon to do over head ROM 2 wks ago  Initiated  last time  shoulder flexion and horizontal ABD in supine using golf club - as well as ext rotation over head in supine   12 reps pt to cont with   As well as massage by husband L upper traps -and do cervical lateral flexion to the R - 10 reps Add YTB for scapula squeezes 2 x 15 reps  And shoulder extention 2  x 15 reps 1 x day -can increase to 3rd set in week  Follow up in 3 wks                                Patient will benefit from skilled therapeutic intervention in order to improve the following deficits and impairments:           Visit Diagnosis: Stiffness of left shoulder, not elsewhere classified  Scar condition and fibrosis of skin    Problem List Patient Active Problem List   Diagnosis Date Noted   Malignant neoplasm of upper-inner quadrant of left breast  in female, estrogen receptor positive ([place]) 10/23/2021   Carcinoma of left breast ([place]) 08/30/2021   Status post left mastectomy 08/22/2021   Other peripheral vertigo, unspecified ear 01/02/2020   Pelvic mass 07/08/2015   Hyperlipidemia, mixed 07/08/2015   Hx of iron deficiency 07/08/2015   Absolute anemia 04/27/2015   Diabetic peripheral neuropathy associated with type 2 diabetes mellitus ([place]) 04/27/2015   Breath shortness 04/27/2015   Accumulation of fluid in tissues 04/27/2015   Cardiac murmur 04/27/2015   HLD (hyperlipidemia) 04/27/2015   Decreased potassium in the blood 04/27/2015   Hypothyroidism 04/27/2015    Obstructive sleep apnea 04/27/2015   Disorder of peripheral nervous system 04/27/2015   Obstructive apnea 04/27/2015   Pain in shoulder 04/27/2015   Fibroid uterus 10/25/2013   Aortic valve stenosis 05/01/2011   Edema 03/27/2011   SOB (shortness of breath) 03/27/2011   HTN (hypertension) 03/27/2011   Essential (primary) hypertension 07/05/2006   Anxiety, generalized 07/05/2006    Rosalyn Gess, OTR/L,CLT 10/30/2021, 5:54 PM  [facility] [address] [place], [address] Phone: [phone]   Fax:  [phone]  Name: Emily Tapia MRN: [id number] Date of Birth: [birth year]

## 2021-10-31 ENCOUNTER — Encounter: Payer: Self-pay | Admitting: Surgery

## 2021-10-31 ENCOUNTER — Ambulatory Visit (INDEPENDENT_AMBULATORY_CARE_PROVIDER_SITE_OTHER): Payer: 59 | Admitting: Surgery

## 2021-10-31 VITALS — BP 131/69 | HR 80 | Temp 98.0°F | Ht 64.0 in | Wt 184.6 lb

## 2021-10-31 DIAGNOSIS — Z09 Encounter for follow-up examination after completed treatment for conditions other than malignant neoplasm: Secondary | ICD-10-CM

## 2021-10-31 DIAGNOSIS — Z452 Encounter for adjustment and management of vascular access device: Secondary | ICD-10-CM

## 2021-10-31 DIAGNOSIS — T8149XA Infection following a procedure, other surgical site, initial encounter: Secondary | ICD-10-CM

## 2021-10-31 NOTE — Progress Notes (Signed)
Follow-up after right IJ Port-A-Cath.  She is already had a round of chemotherapy, and appears to be functioning well.  Resolving bruising noted over the right neck and subclavian area.  Left chest wall wound is continuing to appear clean, well granulated, significantly smaller than last visit.  No additional excisional debridement needed, saline wet to moist dressing replaced.  We will keep up with her monthly basis and have her follow-up for wound check then.

## 2021-10-31 NOTE — Patient Instructions (Addendum)
Continue to do wet to dry dressings to the area until it fully closes.  Follow up here in one month.    Please call and ask to speak with a nurse if you develop questions or concerns.

## 2021-11-02 ENCOUNTER — Inpatient Hospital Stay: Payer: 59

## 2021-11-02 ENCOUNTER — Inpatient Hospital Stay (HOSPITAL_BASED_OUTPATIENT_CLINIC_OR_DEPARTMENT_OTHER): Payer: 59 | Admitting: Oncology

## 2021-11-02 ENCOUNTER — Other Ambulatory Visit: Payer: Self-pay

## 2021-11-02 VITALS — BP 141/85 | HR 75 | Temp 98.2°F | Resp 16 | Wt 181.6 lb

## 2021-11-02 DIAGNOSIS — C50812 Malignant neoplasm of overlapping sites of left female breast: Secondary | ICD-10-CM

## 2021-11-02 DIAGNOSIS — Z170 Estrogen receptor positive status [ER+]: Secondary | ICD-10-CM | POA: Diagnosis not present

## 2021-11-02 LAB — COMPREHENSIVE METABOLIC PANEL
ALT: 48 U/L — ABNORMAL HIGH (ref 0–44)
AST: 36 U/L (ref 15–41)
Albumin: 4.2 g/dL (ref 3.5–5.0)
Alkaline Phosphatase: 61 U/L (ref 38–126)
Anion gap: 8 (ref 5–15)
BUN: 11 mg/dL (ref 8–23)
CO2: 30 mmol/L (ref 22–32)
Calcium: 8.4 mg/dL — ABNORMAL LOW (ref 8.9–10.3)
Chloride: 97 mmol/L — ABNORMAL LOW (ref 98–111)
Creatinine, Ser: 0.59 mg/dL (ref 0.44–1.00)
GFR, Estimated: >60 mL/min (ref >60)
Glucose, Bld: 199 mg/dL — ABNORMAL HIGH (ref 70–99)
Potassium: 3.5 mmol/L (ref 3.5–5.1)
Sodium: 135 mmol/L (ref 135–145)
Total Bilirubin: 0.6 mg/dL (ref 0.3–1.2)
Total Protein: 7.2 g/dL (ref 6.5–8.1)

## 2021-11-02 LAB — CBC WITH DIFFERENTIAL/PLATELET
Abs Immature Granulocytes: 0.47 10*3/uL — ABNORMAL HIGH (ref 0.00–0.07)
Basophils Absolute: 0.1 10*3/uL (ref 0.0–0.1)
Basophils Relative: 2 %
Eosinophils Absolute: 0.1 10*3/uL (ref 0.0–0.5)
Eosinophils Relative: 2 %
HCT: 36.4 % (ref 36.0–46.0)
Hemoglobin: 11.4 g/dL — ABNORMAL LOW (ref 12.0–15.0)
Immature Granulocytes: 14 %
Lymphocytes Relative: 41 %
Lymphs Abs: 1.5 10*3/uL (ref 0.7–4.0)
MCH: 25.5 pg — ABNORMAL LOW (ref 26.0–34.0)
MCHC: 31.3 g/dL (ref 30.0–36.0)
MCV: 81.4 fL (ref 80.0–100.0)
Monocytes Absolute: 0.2 10*3/uL (ref 0.1–1.0)
Monocytes Relative: 6 %
Neutro Abs: 1.2 10*3/uL — ABNORMAL LOW (ref 1.7–7.7)
Neutrophils Relative %: 35 %
Platelets: 103 10*3/uL — ABNORMAL LOW (ref 150–400)
RBC: 4.47 MIL/uL (ref 3.87–5.11)
RDW: 14.6 % (ref 11.5–15.5)
Smear Review: NORMAL
WBC: 3.5 10*3/uL — ABNORMAL LOW (ref 4.0–10.5)
nRBC: 0 % (ref 0.0–0.2)

## 2021-11-02 MED ORDER — DIPHENOXYLATE-ATROPINE 2.5-0.025 MG PO TABS: 2.0000 | ORAL_TABLET | Freq: Four times a day (QID) | ORAL | 1 refills | Status: DC | PRN

## 2021-11-02 NOTE — Progress Notes (Signed)
Pt c/o diarrhea since Sunday despite immodium. Pt drinking plenty of fluids and pedialyte. Pt reports poor appetite d/t changes in taste buds. "Nothing tastes the same."

## 2021-11-03 ENCOUNTER — Encounter: Payer: Self-pay | Admitting: Oncology

## 2021-11-06 ENCOUNTER — Inpatient Hospital Stay: Payer: 59

## 2021-11-06 ENCOUNTER — Other Ambulatory Visit: Payer: Self-pay

## 2021-11-06 ENCOUNTER — Telehealth: Payer: Self-pay | Admitting: *Deleted

## 2021-11-06 ENCOUNTER — Inpatient Hospital Stay (HOSPITAL_BASED_OUTPATIENT_CLINIC_OR_DEPARTMENT_OTHER): Payer: 59 | Admitting: Hospice and Palliative Medicine

## 2021-11-06 ENCOUNTER — Encounter: Payer: Self-pay | Admitting: Hospice and Palliative Medicine

## 2021-11-06 ENCOUNTER — Telehealth: Payer: Self-pay | Admitting: Oncology

## 2021-11-06 VITALS — BP 129/83 | HR 73 | Temp 99.5°F | Resp 18 | Wt 176.4 lb

## 2021-11-06 DIAGNOSIS — Z170 Estrogen receptor positive status [ER+]: Secondary | ICD-10-CM

## 2021-11-06 DIAGNOSIS — C50812 Malignant neoplasm of overlapping sites of left female breast: Secondary | ICD-10-CM

## 2021-11-06 DIAGNOSIS — R300 Dysuria: Secondary | ICD-10-CM

## 2021-11-06 DIAGNOSIS — R197 Diarrhea, unspecified: Secondary | ICD-10-CM | POA: Diagnosis not present

## 2021-11-06 DIAGNOSIS — E860 Dehydration: Secondary | ICD-10-CM

## 2021-11-06 LAB — URINALYSIS, COMPLETE (UACMP) WITH MICROSCOPIC
Bacteria, UA: NONE SEEN
Bilirubin Urine: NEGATIVE
Glucose, UA: NEGATIVE mg/dL
Hgb urine dipstick: NEGATIVE
Ketones, ur: NEGATIVE mg/dL
Leukocytes,Ua: NEGATIVE
Nitrite: NEGATIVE
Protein, ur: NEGATIVE mg/dL
Specific Gravity, Urine: 1.016 (ref 1.005–1.030)
Squamous Epithelial / HPF: NONE SEEN (ref 0–5)
pH: 5 (ref 5.0–8.0)

## 2021-11-06 LAB — CBC WITH DIFFERENTIAL/PLATELET
Abs Immature Granulocytes: 3.98 10*3/uL — ABNORMAL HIGH (ref 0.00–0.07)
Basophils Absolute: 0.1 10*3/uL (ref 0.0–0.1)
Basophils Relative: 0 %
Eosinophils Absolute: 0 10*3/uL (ref 0.0–0.5)
Eosinophils Relative: 0 %
HCT: 36.7 % (ref 36.0–46.0)
Hemoglobin: 11.7 g/dL — ABNORMAL LOW (ref 12.0–15.0)
Immature Granulocytes: 15 %
Lymphocytes Relative: 12 %
Lymphs Abs: 3.3 10*3/uL (ref 0.7–4.0)
MCH: 25.5 pg — ABNORMAL LOW (ref 26.0–34.0)
MCHC: 31.9 g/dL (ref 30.0–36.0)
MCV: 80 fL (ref 80.0–100.0)
Monocytes Absolute: 2 10*3/uL — ABNORMAL HIGH (ref 0.1–1.0)
Monocytes Relative: 7 %
Neutro Abs: 18 10*3/uL — ABNORMAL HIGH (ref 1.7–7.7)
Neutrophils Relative %: 66 %
Platelets: 127 10*3/uL — ABNORMAL LOW (ref 150–400)
RBC: 4.59 MIL/uL (ref 3.87–5.11)
RDW: 15 % (ref 11.5–15.5)
Smear Review: NORMAL
WBC: 27.3 10*3/uL — ABNORMAL HIGH (ref 4.0–10.5)
nRBC: 0.1 % (ref 0.0–0.2)

## 2021-11-06 LAB — COMPREHENSIVE METABOLIC PANEL
ALT: 35 U/L (ref 0–44)
AST: 27 U/L (ref 15–41)
Albumin: 4.1 g/dL (ref 3.5–5.0)
Alkaline Phosphatase: 119 U/L (ref 38–126)
Anion gap: 6 (ref 5–15)
BUN: 15 mg/dL (ref 8–23)
CO2: 23 mmol/L (ref 22–32)
Calcium: 8.4 mg/dL — ABNORMAL LOW (ref 8.9–10.3)
Chloride: 102 mmol/L (ref 98–111)
Creatinine, Ser: 0.65 mg/dL (ref 0.44–1.00)
GFR, Estimated: >60 mL/min (ref >60)
Glucose, Bld: 168 mg/dL — ABNORMAL HIGH (ref 70–99)
Potassium: 3.9 mmol/L (ref 3.5–5.1)
Sodium: 131 mmol/L — ABNORMAL LOW (ref 135–145)
Total Bilirubin: 0.1 mg/dL — ABNORMAL LOW (ref 0.3–1.2)
Total Protein: 7.2 g/dL (ref 6.5–8.1)

## 2021-11-06 LAB — MAGNESIUM: Magnesium: 1.6 mg/dL — ABNORMAL LOW (ref 1.7–2.4)

## 2021-11-06 MED ORDER — HEPARIN SOD (PORK) LOCK FLUSH 100 UNIT/ML IV SOLN
500.0000 [IU] | Freq: Once | INTRAVENOUS | Status: AC
  Administered 2021-11-06: 500 [IU] via INTRAVENOUS
  Filled 2021-11-06: qty 5

## 2021-11-06 MED ORDER — MUPIROCIN CALCIUM 2 % EX CREA: 1.0000 "application " | TOPICAL_CREAM | Freq: Two times a day (BID) | CUTANEOUS | 0 refills | Status: DC

## 2021-11-06 MED ORDER — SODIUM CHLORIDE 0.9% FLUSH
10.0000 mL | Freq: Once | INTRAVENOUS | Status: AC
  Administered 2021-11-06: 10 mL via INTRAVENOUS
  Filled 2021-11-06: qty 10

## 2021-11-06 MED ORDER — SODIUM CHLORIDE 0.9 % IV SOLN
Freq: Once | INTRAVENOUS | Status: AC
  Filled 2021-11-06: qty 250

## 2021-11-06 NOTE — Telephone Encounter (Signed)
Called pt, and she is agreeable to [facility] appointment this afternoon. Appointments scheduled.

## 2021-11-06 NOTE — Progress Notes (Signed)
[facility] [place]  Telephone:(336) [phone] Fax:(336) [phone]  Patient Care Team: [name], [name] as PCP - General (Family Medicine) [name], MD as PCP - Cardiology (Cardiology) Ronny Bacon, MD as Consulting Physician (General Surgery)   Name of the patient: Emily Tapia  [id number]  1961-02-20   Date of visit: 11/06/21  Reason for Consult:  Emily Tapia is a 60 year old woman with multiple medical problems including stage Ib triple positive multifocal carcinoma left breast status post mastectomy now on adjuvant chemotherapy with Taxotere, carboplatin, Perjeta, and Herceptin, which she last received on 10/27/2021.  She received Udenyca on 10/31/2019.  Patient presents to [facility] today for evaluation of dysuria and diarrhea.  Patient endorses diarrhea starting a couple of days after her last chemotherapy and have persisted until today.  She started Imodium and that helped initially but then seemed to lack effectiveness.  She restarted on Lomotil on 11/17 by Dr. Grayland Ormond but she is also not found that to be particularly effective.  She reports 4-5 loose, watery stools daily.  She has crampy abdominal pain.  No bleeding or mucus in stool but she has had trace blood on toilet paper when wiping.  She also has several days of dysuria.  No fever or chills.  No nausea or vomiting.  Denies any neurologic complaints. Denies recent fevers or illnesses. Denies any easy bleeding or bruising.  Denies chest pain. Patient offers no further specific complaints today.  PAST MEDICAL HISTORY: Past Medical History:  Diagnosis Date   Anxiety    Aortic stenosis    Diabetes mellitus    Edema    Heart murmur    Hypertension    Sleep apnea    Thyroid disease     PAST SURGICAL HISTORY:  Past Surgical History:  Procedure Laterality Date   ABDOMINAL HYSTERECTOMY  11/17/2013   BREAST BIOPSY     x2   BREAST BIOPSY Left 07/20/2021   [place] Bx,  Ribbon Clip, Path pending   BREAST BIOPSY Left 07/20/2021   Stereo Bx, X-clip, path pending   BREAST BIOPSY Left 07/20/2021   Stereo biopsy, coil clip, path pending   BREAST BIOPSY Right 07/20/2021   Stereo Bx, Ribbon Clip, path pending   CESAREAN SECTION     x 2   CHOLECYSTECTOMY  12/17/2001   DUE TO STONES   DILATION AND CURETTAGE OF UTERUS     ELBOW SURGERY     POLYPECTOMY  12/17/1993   PORTACATH PLACEMENT Right 10/25/2021   Procedure: INSERTION PORT-A-CATH;  Surgeon: Ronny Bacon, MD;  Location: [facility] ORS;  Service: General;  Laterality: Right;   SIMPLE MASTECTOMY WITH AXILLARY SENTINEL NODE BIOPSY Left 08/16/2021   Procedure: SIMPLE MASTECTOMY WITH AXILLARY SENTINEL NODE BIOPSY;  Surgeon: Ronny Bacon, MD;  Location: [facility] ORS;  Service: General;  Laterality: Left;    HEMATOLOGY/ONCOLOGY HISTORY:  Oncology History  Ductal carcinoma in situ (DCIS) of left breast (Resolved)  07/27/2021 Initial Diagnosis   Ductal carcinoma in situ (DCIS) of left breast   07/27/2021 Cancer Staging   Staging form: Breast, AJCC 8th Edition - Clinical stage from 07/27/2021: Stage 0 (cTis (DCIS), cN0, cM0, ER+, PR+, HER2-) - Signed by Lloyd Huger, MD on 07/27/2021    Carcinoma of left breast ([place])  08/30/2021 Initial Diagnosis   Carcinoma of left breast ([place])   08/30/2021 Cancer Staging   Staging form: Breast, AJCC 8th Edition - Pathologic stage from 08/30/2021: Stage IB (pT2, pN1a, cM0, G3, ER+, PR+, HER2+,  Oncotype DX score: 18) - Signed by Lloyd Huger, MD on 09/13/2021 Stage prefix: Initial diagnosis Multigene prognostic tests performed: Oncotype DX Recurrence score range: Greater than or equal to 11 Histologic grading system: 3 grade system    10/27/2021 -  Chemotherapy   Patient is on Treatment Plan : BREAST  Docetaxel + Carboplatin + Trastuzumab + Pertuzumab  (TCHP) q21d / Trastuzumab + Pertuzumab q21d       ALLERGIES:  is allergic to fosaprepitant.  MEDICATIONS:   Current Outpatient Medications  Medication Sig Dispense Refill   Ascorbic Acid (VITAMIN C PO) Take 1 tablet by mouth daily.     aspirin 81 MG EC tablet Take 81 mg by mouth at bedtime.     carvedilol (COREG) 25 MG tablet Take 1 tablet (25 mg total) by mouth 2 (two) times daily. 180 tablet 1   Cholecalciferol (VITAMIN D3) 1000 UNITS CAPS Take 1,000 Units by mouth in the morning.     diphenoxylate-atropine (LOMOTIL) 2.5-0.025 MG tablet Take 2 tablets by mouth 4 (four) times daily as needed for diarrhea or loose stools. 60 tablet 1   Dulaglutide (TRULICITY) 3 AV/4.0JW SOPN Inject 3 mg as directed once a week. 2 mL 3   Ferrous Sulfate (IRON) 325 (65 Fe) MG TABS Take 1 tablet by mouth once daily with breakfast 90 tablet 0   glucose blood (CONTOUR NEXT TEST) test strip Test fasting sugar each morning. Recheck if having hypoglycemic symptoms. 100 each 4   glyBURIDE (DIABETA) 5 MG tablet TAKE 1 TABLET BY MOUTH TWICE DAILY WITH MEALS. 90 tablet 1   hydrochlorothiazide (HYDRODIURIL) 25 MG tablet Take 1 tablet (25 mg total) by mouth daily. 90 tablet 1   levothyroxine (EUTHYROX) 75 MCG tablet TAKE 1 TABLET BY MOUTH ONCE DAILY BEFORE BREAKFAST . 90 tablet 1   lidocaine-prilocaine (EMLA) cream Apply to affected area once 30 g 3   lisinopril (ZESTRIL) 40 MG tablet Take 1 tablet (40 mg total) by mouth daily. 90 tablet 1   metFORMIN (GLUCOPHAGE) 1000 MG tablet Take 1 tablet (1,000 mg total) by mouth 2 (two) times daily with a meal. 180 tablet 1   Multiple Vitamin (MULTIVITAMIN) tablet Take 1 tablet by mouth every morning.     PARoxetine (PAXIL) 20 MG tablet Take 1 tablet (20 mg total) by mouth daily. 90 tablet 1   zinc gluconate 50 MG tablet Take 50 mg by mouth daily.     furosemide (LASIX) 40 MG tablet TAKE 1 TABLET BY MOUTH ONCE DAILY AS NEEDED FOR FLUID OR EDEMA (Patient not taking: Reported on 11/06/2021) 90 tablet 1   ibuprofen (ADVIL) 800 MG tablet Take 1 tablet (800 mg total) by mouth every 8 (eight)  hours as needed. (Patient not taking: Reported on 11/06/2021) 30 tablet 0   ondansetron (ZOFRAN) 8 MG tablet Take 1 tablet (8 mg total) by mouth 2 (two) times daily as needed for refractory nausea / vomiting. (Patient not taking: Reported on 11/06/2021) 60 tablet 2   prochlorperazine (COMPAZINE) 10 MG tablet Take 1 tablet (10 mg total) by mouth every 6 (six) hours as needed (Nausea or vomiting). 60 tablet 2   No current facility-administered medications for this visit.    VITAL SIGNS: There were no vitals taken for this visit. There were no vitals filed for this visit.  Estimated body mass index is 31.17 kg/m as calculated from the following:   Height as of 10/31/21: $RemoveBefo'5\' 4"'vYoxFhDNBwk$  (1.626 m).   Weight as of 11/02/21: 181 lb  9.6 oz (82.4 kg).  LABS: CBC:    Component Value Date/Time   WBC 3.5 (L) 11/02/2021 0946   HGB 11.4 (L) 11/02/2021 0946   HGB 12.6 07/14/2021 0936   HCT 36.4 11/02/2021 0946   HCT 39.7 07/14/2021 0936   PLT 103 (L) 11/02/2021 0946   PLT 130 (L) 07/14/2021 0936   MCV 81.4 11/02/2021 0946   MCV 80 07/14/2021 0936   NEUTROABS 1.2 (L) 11/02/2021 0946   NEUTROABS 3.7 07/14/2021 0936   LYMPHSABS 1.5 11/02/2021 0946   LYMPHSABS 2.2 07/14/2021 0936   MONOABS 0.2 11/02/2021 0946   EOSABS 0.1 11/02/2021 0946   EOSABS 0.2 07/14/2021 0936   BASOSABS 0.1 11/02/2021 0946   BASOSABS 0.0 07/14/2021 0936   Comprehensive Metabolic Panel:    Component Value Date/Time   NA 135 11/02/2021 0946   NA 142 07/14/2021 0936   K 3.5 11/02/2021 0946   CL 97 (L) 11/02/2021 0946   CO2 30 11/02/2021 0946   BUN 11 11/02/2021 0946   BUN 13 07/14/2021 0936   CREATININE 0.59 11/02/2021 0946   GLUCOSE 199 (H) 11/02/2021 0946   CALCIUM 8.4 (L) 11/02/2021 0946   AST 36 11/02/2021 0946   ALT 48 (H) 11/02/2021 0946   ALKPHOS 61 11/02/2021 0946   BILITOT 0.6 11/02/2021 0946   BILITOT 0.4 07/14/2021 0936   PROT 7.2 11/02/2021 0946   PROT 6.8 07/14/2021 0936   ALBUMIN 4.2 11/02/2021 0946    ALBUMIN 4.6 07/14/2021 0936    RADIOGRAPHIC STUDIES: DG Chest Port 1 View  Result Date: 10/25/2021 CLINICAL DATA:  Central venous catheter placement EXAM: PORTABLE CHEST 1 VIEW COMPARISON:  01/02/2020 FINDINGS: A power injectable right IJ Port-A-Cath is noted with its tip projecting over the SVC. No pneumothorax or complicating feature. Small clips project over the left axilla. The lungs appear clear. Heart size within normal limits for projection. Atherosclerotic calcification of the aortic arch. IMPRESSION: 1. Right IJ power injectable Port-A-Cath tip: SVC. No pneumothorax or complicating feature identified. 2.  Aortic Atherosclerosis (ICD10-I70.0). Electronically Signed   By: Van Clines M.D.   On: 10/25/2021 13:53   DG C-Arm 1-60 Min-No Report  Result Date: 10/25/2021 Fluoroscopy was utilized by the requesting physician.  No radiographic interpretation.    PERFORMANCE STATUS (ECOG) : 2 - Symptomatic, <50% confined to bed  Review of Systems Unless otherwise noted, a complete review of systems is negative.  Physical Exam General: NAD Cardiovascular: regular rate and rhythm Pulmonary: clear ant fields Abdomen: soft, nontender, + bowel sounds GU: no suprapubic tenderness Extremities: no edema, no joint deformities Skin: Several small isolated/scattered pustules on abdomen Neurological: Weakness but otherwise nonfocal  Assessment and Plan- Patient is a 60 y.o. female with multiple medical problems including stage Ib triple positive multifocal carcinoma left breast status post mastectomy now on adjuvant chemotherapy with Taxotere, carboplatin, Perjeta, and Herceptin, which she last received on 10/27/2021.  She received Udenyca on 10/31/2019.  Patient presents to [facility] for evaluation of diarrhea and dysuria   Diarrhea -this is likely secondary to chemotherapy.  However, will send stool sample for GI panel and C. difficile PCR to rule out infectious etiology.  If GI panel negative,  would recommend continued supportive care with antidiarrheals and increase fluids.  Patient may require adjustment to her chemotherapy regimen but will defer to Dr. Grayland Ormond who patient sees next week.  Hyponatremia -likely secondary to volume deficit.  We will proceed with IV fluids today.  Leukocytosis -likely secondary to [facility].  However, again, will rule out infectious causes of diarrhea and treat accordingly.  Dysuria -UA grossly normal.  Patient feels like she may have yeast infection but was not interested in exam today.  Suggest that she could follow-up with PCP for discussed OTC treatment.  Pustules -topical Bactroban  ER triggers discussed.  Patient to follow-up next week with Dr. Grayland Ormond or sooner in [facility] if needed  Patient expressed understanding and was in agreement with this plan. She also understands that She can call clinic at any time with any questions, concerns, or complaints.   Thank you for allowing me to participate in the care of this very pleasant patient.   Time Total: 20 minutes  Visit consisted of counseling and education dealing with the complex and emotionally intense issues of symptom management in the setting of serious illness.Greater than 50%  of this time was spent counseling and coordinating care related to the above assessment and plan.  Signed by: Altha Harm, PhD, NP-C

## 2021-11-06 NOTE — Progress Notes (Signed)
Diarrhea started 1 week ago on Sunday 10/29/21. Urinary symptoms started 4 or 5 days ago with backache and burning with urination. Pt has had mild blood noted after using bathroom not sure if coming from the front or back. Change in taste is making eating difficult. Forcing herself to eat. Drinking pedialyte.No nausea. Has 2 or 3 red blisters/ skin rash on abdomen. Pt is packing open breast wound with saline guaze. Drainage tube is still in place.

## 2021-11-06 NOTE — Telephone Encounter (Signed)
Patient called to report that she is still having diarrhea and is now experiencing severe burning with urination.

## 2021-11-06 NOTE — Telephone Encounter (Signed)
Pt called to reschedule appt. Please call back at [phone]

## 2021-11-07 ENCOUNTER — Other Ambulatory Visit: Payer: Self-pay | Admitting: *Deleted

## 2021-11-07 ENCOUNTER — Other Ambulatory Visit: Payer: Self-pay

## 2021-11-07 DIAGNOSIS — C50812 Malignant neoplasm of overlapping sites of left female breast: Secondary | ICD-10-CM | POA: Diagnosis not present

## 2021-11-07 DIAGNOSIS — R197 Diarrhea, unspecified: Secondary | ICD-10-CM

## 2021-11-07 LAB — C DIFFICILE QUICK SCREEN W PCR REFLEX
C Diff antigen: NEGATIVE
C Diff interpretation: NOT DETECTED
C Diff toxin: NEGATIVE

## 2021-11-07 LAB — URINE CULTURE: Culture: <10000 — AB

## 2021-11-07 MED ORDER — MUPIROCIN 2 % EX OINT: 1.0000 "application " | TOPICAL_OINTMENT | Freq: Two times a day (BID) | CUTANEOUS | 0 refills | Status: DC

## 2021-11-07 NOTE — Telephone Encounter (Signed)
Pharmacy requesting Bactroban ointment as cream is not covered by insurance

## 2021-11-13 NOTE — Progress Notes (Signed)
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    INTERVAL HISTORY: Patient returns to clinic today for further evaluation and consideration of cycle 2 of Taxotere, carboplatinum, Herceptin, and Perjeta.  She continues to have loose stools over the past 1 to 2 weeks, but this is since resolved.  She currently feels well and is asymptomatic.  Her surgical wound still requires packing and is slowly healing, but has not become worse.  She has no neurologic complaints.  She denies any recent fevers or illnesses.  She has a good appetite and denies weight loss.  She has no chest pain, shortness of breath, cough, or hemoptysis.  She denies any nausea, vomiting, or constipation.  She has no urinary complaints.  Patient offers no specific complaints today.  REVIEW OF SYSTEMS:   Review of Systems  Constitutional: Negative.  Negative for fever, malaise/fatigue and weight loss.  Respiratory: Negative.  Negative for cough and shortness of breath.   Cardiovascular: Negative.  Negative for chest pain and leg swelling.  Gastrointestinal: Negative.  Negative for abdominal pain and diarrhea.  Genitourinary: Negative.  Negative for dysuria.  Musculoskeletal: Negative.  Negative for back pain.  Skin: Negative.  Negative for rash.  Neurological: Negative.  Negative for dizziness, focal weakness, weakness and headaches.  Psychiatric/Behavioral: Negative.  The patient is not nervous/anxious.    As per HPI. Otherwise, a complete review of systems is  negative.  PAST MEDICAL HISTORY: Past Medical History:  Diagnosis Date   Anxiety    Aortic stenosis    Diabetes mellitus    Edema    Heart murmur    Hypertension    Sleep apnea    Thyroid disease     PAST SURGICAL HISTORY: Past Surgical History:  Procedure Laterality Date   ABDOMINAL HYSTERECTOMY  11/17/2013   BREAST BIOPSY     x2   BREAST BIOPSY Left 07/20/2021   [place] Bx, Ribbon Clip, Path pending   BREAST BIOPSY Left 07/20/2021   Stereo Bx, X-clip, path pending   BREAST BIOPSY Left 07/20/2021   Stereo biopsy, coil clip, path pending   BREAST BIOPSY Right 07/20/2021   Stereo Bx, Ribbon Clip, path pending   CESAREAN SECTION     x 2   CHOLECYSTECTOMY  12/17/2001   DUE TO STONES   DILATION AND CURETTAGE OF UTERUS     ELBOW SURGERY     POLYPECTOMY  12/17/1993   PORTACATH PLACEMENT Right 10/25/2021   Procedure: INSERTION PORT-A-CATH;  Surgeon: Ronny Bacon, MD;  Location: [facility] ORS;  Service: General;  Laterality: Right;   SIMPLE MASTECTOMY WITH AXILLARY SENTINEL NODE BIOPSY Left 08/16/2021   Procedure: SIMPLE MASTECTOMY WITH AXILLARY SENTINEL NODE BIOPSY;  Surgeon: Ronny Bacon, MD;  Location: [facility] ORS;  Service: General;  Laterality: Left;    FAMILY HISTORY: Family History  Problem Relation Age of Onset   Hyperlipidemia Mother    Thyroid disease Mother    Hashimoto's thyroiditis Mother    Heart disease Father        open heart surgery   Heart failure Father    Diabetes Father  Dementia Father    Thyroid disease Brother    Breast cancer Maternal Grandmother    Diabetes Paternal Grandmother    Prostate cancer Paternal Grandfather     ADVANCED DIRECTIVES (Y/N):  N  HEALTH MAINTENANCE: Social History   Tobacco Use   Smoking status: Former    Packs/day: 1.50    Years: 25.00    Pack years: 37.50    Types: Cigarettes    Quit date: 11/24/2001    Years since quitting: 19.9   Smokeless tobacco: Never  Vaping Use   Vaping Use: Never used  Substance  Use Topics   Alcohol use: Yes    Alcohol/week: 0.0 standard drinks    Comment: OCCASIONALLY DRINKS WINE, ONCE A WEEK   Drug use: No     Colonoscopy:  PAP:  Bone density:  Lipid panel:  Allergies  Allergen Reactions   Fosaprepitant Nausea Only and Other (See Comments)    Back pain, nausea. See progress note from 10/27/2021     Current Outpatient Medications  Medication Sig Dispense Refill   Ascorbic Acid (VITAMIN C PO) Take 1 tablet by mouth daily.     aspirin 81 MG EC tablet Take 81 mg by mouth at bedtime.     carvedilol (COREG) 25 MG tablet Take 1 tablet (25 mg total) by mouth 2 (two) times daily. 180 tablet 1   Cholecalciferol (VITAMIN D3) 1000 UNITS CAPS Take 1,000 Units by mouth in the morning.     diphenoxylate-atropine (LOMOTIL) 2.5-0.025 MG tablet Take 2 tablets by mouth 4 (four) times daily as needed for diarrhea or loose stools. 60 tablet 1   Dulaglutide (TRULICITY) 3 WU/9.8JX SOPN Inject 3 mg as directed once a week. 2 mL 3   Ferrous Sulfate (IRON) 325 (65 Fe) MG TABS Take 1 tablet by mouth once daily with breakfast 90 tablet 0   furosemide (LASIX) 40 MG tablet TAKE 1 TABLET BY MOUTH ONCE DAILY AS NEEDED FOR FLUID OR EDEMA 90 tablet 1   glucose blood (CONTOUR NEXT TEST) test strip Test fasting sugar each morning. Recheck if having hypoglycemic symptoms. 100 each 4   glyBURIDE (DIABETA) 5 MG tablet TAKE 1 TABLET BY MOUTH TWICE DAILY WITH MEALS. 90 tablet 1   hydrochlorothiazide (HYDRODIURIL) 25 MG tablet Take 1 tablet (25 mg total) by mouth daily. 90 tablet 1   levothyroxine (EUTHYROX) 75 MCG tablet TAKE 1 TABLET BY MOUTH ONCE DAILY BEFORE BREAKFAST . 90 tablet 1   lidocaine-prilocaine (EMLA) cream Apply to affected area once 30 g 3   lisinopril (ZESTRIL) 40 MG tablet Take 1 tablet (40 mg total) by mouth daily. 90 tablet 1   metFORMIN (GLUCOPHAGE) 1000 MG tablet Take 1 tablet (1,000 mg total) by mouth 2 (two) times daily with a meal. 180 tablet 1   Multiple Vitamin  (MULTIVITAMIN) tablet Take 1 tablet by mouth every morning.     mupirocin ointment (BACTROBAN) 2 % Apply 1 application topically 2 (two) times daily. 22 g 0   PARoxetine (PAXIL) 20 MG tablet Take 1 tablet (20 mg total) by mouth daily. 90 tablet 1   zinc gluconate 50 MG tablet Take 50 mg by mouth daily.     ibuprofen (ADVIL) 800 MG tablet Take 1 tablet (800 mg total) by mouth every 8 (eight) hours as needed. (Patient not taking: Reported on 11/06/2021) 30 tablet 0   ondansetron (ZOFRAN) 8 MG tablet Take 1 tablet (8 mg total) by mouth 2 (two) times daily as needed for refractory nausea /  vomiting. (Patient not taking: Reported on 11/16/2021) 60 tablet 2   prochlorperazine (COMPAZINE) 10 MG tablet Take 1 tablet (10 mg total) by mouth every 6 (six) hours as needed (Nausea or vomiting). (Patient not taking: Reported on 11/16/2021) 60 tablet 2   No current facility-administered medications for this visit.    OBJECTIVE: Vitals:   11/16/21 0956  BP: (!) 146/75  Pulse: 67  Resp: 16  Temp: 99.4 F (37.4 C)  SpO2: 99%     Body mass index is 30.81 kg/m.    ECOG FS:0 - Asymptomatic  General: Well-developed, well-nourished, no acute distress. Eyes: Pink conjunctiva, anicteric sclera. HEENT: Normocephalic, moist mucous membranes. Lungs: No audible wheezing or coughing. Heart: Regular rate and rhythm. Abdomen: Soft, nontender, no obvious distention. Musculoskeletal: No edema, cyanosis, or clubbing. Neuro: Alert, answering all questions appropriately. Cranial nerves grossly intact. Skin: No rashes or petechiae noted. Psych: Normal affect.   LAB RESULTS:  Lab Results  Component Value Date   NA 141 11/16/2021   K 3.2 (L) 11/16/2021   CL 97 (L) 11/16/2021   CO2 31 11/16/2021   GLUCOSE 209 (H) 11/16/2021   BUN 10 11/16/2021   CREATININE 0.69 11/16/2021   CALCIUM 8.7 (L) 11/16/2021   PROT 6.5 11/16/2021   ALBUMIN 3.9 11/16/2021   AST 22 11/16/2021   ALT 27 11/16/2021   ALKPHOS 62  11/16/2021   BILITOT 0.4 11/16/2021   GFRNONAA >60 11/16/2021   GFRAA 111 10/14/2020    Lab Results  Component Value Date   WBC 7.8 11/16/2021   NEUTROABS 5.7 11/16/2021   HGB 10.3 (L) 11/16/2021   HCT 31.9 (L) 11/16/2021   MCV 81.6 11/16/2021   PLT 72 (L) 11/16/2021     STUDIES: DG Chest Port 1 View  Result Date: 10/25/2021 CLINICAL DATA:  Central venous catheter placement EXAM: PORTABLE CHEST 1 VIEW COMPARISON:  01/02/2020 FINDINGS: A power injectable right IJ Port-A-Cath is noted with its tip projecting over the SVC. No pneumothorax or complicating feature. Small clips project over the left axilla. The lungs appear clear. Heart size within normal limits for projection. Atherosclerotic calcification of the aortic arch. IMPRESSION: 1. Right IJ power injectable Port-A-Cath tip: SVC. No pneumothorax or complicating feature identified. 2.  Aortic Atherosclerosis (ICD10-I70.0). Electronically Signed   By: Van Clines M.D.   On: 10/25/2021 13:53   DG C-Arm 1-60 Min-No Report  Result Date: 10/25/2021 Fluoroscopy was utilized by the requesting physician.  No radiographic interpretation.    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  Previously, right breast biopsy was negative for malignancy.  Given stage and HER2 status of disease, patient will require adjuvant treatment using Taxotere, carboplatinum, Perjeta, and Herceptin every 3 weeks for 6 cycles with Udenyca support and then maintenance Herceptin and Perjeta every 3 weeks for an entire year.  She will also benefit from letrozole for 5 years at the conclusion of all of her treatments.  MUGA scan from September 21, 2021 revealed a EF of 69.3%.  Patient has had port  placement.  Patient tolerated cycle 1 relatively well only with some increased diarrhea.  Delay cycle 2 of treatment secondary to thrombocytopenia.  Return to clinic in 1 week for further evaluation and reconsideration  of treatment.  Of note, patient has laboratory work and evaluation on day 1 and treatment on cycle 2 of chemotherapy on day 2.   Mastectomy wound: Continue follow-up with surgery as scheduled.  Proceed cautiously with treatment as above. Diarrhea: Consider dose reduction of Taxotere in the future.  Continue Lomotil as needed.   Leukopenia: Resolved.  Udenyca as above. Thrombocytopenia: Secondary to chemotherapy, monitor.  Delay treatment as above. Fosaprepitant: Patient had an allergic reaction to this premedication and has subsequently been deleted from her treatment regimen.   Patient expressed understanding and was in agreement with this plan. She also understands that She can call clinic at any time with any questions, concerns, or complaints.    Cancer Staging  Carcinoma of left breast [facility]) Staging form: Breast, AJCC 8th Edition - Pathologic stage from 08/30/2021: Stage IB (pT2, pN1a, cM0, G3, ER+, PR+, HER2+, Oncotype DX score: 18) - Signed by Lloyd Huger, MD on 09/13/2021 Stage prefix: Initial diagnosis Multigene prognostic tests performed: Oncotype DX Recurrence score range: Greater than or equal to 11 Histologic grading system: 3 grade system  Lloyd Huger, MD   11/16/2021 10:51 AM

## 2021-11-16 ENCOUNTER — Other Ambulatory Visit: Payer: Self-pay

## 2021-11-16 ENCOUNTER — Encounter: Payer: Self-pay | Admitting: Oncology

## 2021-11-16 ENCOUNTER — Inpatient Hospital Stay: Payer: 59 | Attending: Oncology | Admitting: Oncology

## 2021-11-16 ENCOUNTER — Inpatient Hospital Stay: Payer: 59 | Attending: Oncology

## 2021-11-16 VITALS — BP 146/75 | HR 67 | Temp 99.4°F | Resp 16 | Wt 179.5 lb

## 2021-11-16 DIAGNOSIS — C50912 Malignant neoplasm of unspecified site of left female breast: Secondary | ICD-10-CM | POA: Diagnosis not present

## 2021-11-16 DIAGNOSIS — Z8042 Family history of malignant neoplasm of prostate: Secondary | ICD-10-CM | POA: Insufficient documentation

## 2021-11-16 DIAGNOSIS — Z818 Family history of other mental and behavioral disorders: Secondary | ICD-10-CM | POA: Diagnosis not present

## 2021-11-16 DIAGNOSIS — Z833 Family history of diabetes mellitus: Secondary | ICD-10-CM | POA: Insufficient documentation

## 2021-11-16 DIAGNOSIS — D6959 Other secondary thrombocytopenia: Secondary | ICD-10-CM | POA: Insufficient documentation

## 2021-11-16 DIAGNOSIS — Z87891 Personal history of nicotine dependence: Secondary | ICD-10-CM | POA: Insufficient documentation

## 2021-11-16 DIAGNOSIS — F419 Anxiety disorder, unspecified: Secondary | ICD-10-CM | POA: Insufficient documentation

## 2021-11-16 DIAGNOSIS — C779 Secondary and unspecified malignant neoplasm of lymph node, unspecified: Secondary | ICD-10-CM | POA: Insufficient documentation

## 2021-11-16 DIAGNOSIS — E039 Hypothyroidism, unspecified: Secondary | ICD-10-CM | POA: Insufficient documentation

## 2021-11-16 DIAGNOSIS — Z5189 Encounter for other specified aftercare: Secondary | ICD-10-CM | POA: Diagnosis not present

## 2021-11-16 DIAGNOSIS — Z79899 Other long term (current) drug therapy: Secondary | ICD-10-CM | POA: Insufficient documentation

## 2021-11-16 DIAGNOSIS — Z8249 Family history of ischemic heart disease and other diseases of the circulatory system: Secondary | ICD-10-CM | POA: Diagnosis not present

## 2021-11-16 DIAGNOSIS — Z8349 Family history of other endocrine, nutritional and metabolic diseases: Secondary | ICD-10-CM | POA: Diagnosis not present

## 2021-11-16 DIAGNOSIS — E119 Type 2 diabetes mellitus without complications: Secondary | ICD-10-CM | POA: Insufficient documentation

## 2021-11-16 DIAGNOSIS — C50812 Malignant neoplasm of overlapping sites of left female breast: Secondary | ICD-10-CM

## 2021-11-16 DIAGNOSIS — I350 Nonrheumatic aortic (valve) stenosis: Secondary | ICD-10-CM | POA: Insufficient documentation

## 2021-11-16 DIAGNOSIS — Z9049 Acquired absence of other specified parts of digestive tract: Secondary | ICD-10-CM | POA: Insufficient documentation

## 2021-11-16 DIAGNOSIS — Z803 Family history of malignant neoplasm of breast: Secondary | ICD-10-CM | POA: Insufficient documentation

## 2021-11-16 DIAGNOSIS — R197 Diarrhea, unspecified: Secondary | ICD-10-CM | POA: Insufficient documentation

## 2021-11-16 DIAGNOSIS — Z7984 Long term (current) use of oral hypoglycemic drugs: Secondary | ICD-10-CM | POA: Insufficient documentation

## 2021-11-16 DIAGNOSIS — I10 Essential (primary) hypertension: Secondary | ICD-10-CM | POA: Insufficient documentation

## 2021-11-16 DIAGNOSIS — Z5112 Encounter for antineoplastic immunotherapy: Secondary | ICD-10-CM | POA: Insufficient documentation

## 2021-11-16 DIAGNOSIS — Z170 Estrogen receptor positive status [ER+]: Secondary | ICD-10-CM | POA: Diagnosis not present

## 2021-11-16 DIAGNOSIS — D649 Anemia, unspecified: Secondary | ICD-10-CM | POA: Diagnosis not present

## 2021-11-16 DIAGNOSIS — Z5111 Encounter for antineoplastic chemotherapy: Secondary | ICD-10-CM | POA: Insufficient documentation

## 2021-11-16 DIAGNOSIS — I700 Atherosclerosis of aorta: Secondary | ICD-10-CM | POA: Insufficient documentation

## 2021-11-16 DIAGNOSIS — D696 Thrombocytopenia, unspecified: Secondary | ICD-10-CM | POA: Insufficient documentation

## 2021-11-16 LAB — COMPREHENSIVE METABOLIC PANEL
ALT: 27 U/L (ref 0–44)
AST: 22 U/L (ref 15–41)
Albumin: 3.9 g/dL (ref 3.5–5.0)
Alkaline Phosphatase: 62 U/L (ref 38–126)
Anion gap: 13 (ref 5–15)
BUN: 10 mg/dL (ref 8–23)
CO2: 31 mmol/L (ref 22–32)
Calcium: 8.7 mg/dL — ABNORMAL LOW (ref 8.9–10.3)
Chloride: 97 mmol/L — ABNORMAL LOW (ref 98–111)
Creatinine, Ser: 0.69 mg/dL (ref 0.44–1.00)
GFR, Estimated: >60 mL/min (ref >60)
Glucose, Bld: 209 mg/dL — ABNORMAL HIGH (ref 70–99)
Potassium: 3.2 mmol/L — ABNORMAL LOW (ref 3.5–5.1)
Sodium: 141 mmol/L (ref 135–145)
Total Bilirubin: 0.4 mg/dL (ref 0.3–1.2)
Total Protein: 6.5 g/dL (ref 6.5–8.1)

## 2021-11-16 LAB — CBC WITH DIFFERENTIAL/PLATELET
Abs Immature Granulocytes: 0.05 10*3/uL (ref 0.00–0.07)
Basophils Absolute: 0.1 10*3/uL (ref 0.0–0.1)
Basophils Relative: 1 %
Eosinophils Absolute: 0 10*3/uL (ref 0.0–0.5)
Eosinophils Relative: 0 %
HCT: 31.9 % — ABNORMAL LOW (ref 36.0–46.0)
Hemoglobin: 10.3 g/dL — ABNORMAL LOW (ref 12.0–15.0)
Immature Granulocytes: 1 %
Lymphocytes Relative: 21 %
Lymphs Abs: 1.7 10*3/uL (ref 0.7–4.0)
MCH: 26.3 pg (ref 26.0–34.0)
MCHC: 32.3 g/dL (ref 30.0–36.0)
MCV: 81.6 fL (ref 80.0–100.0)
Monocytes Absolute: 0.4 10*3/uL (ref 0.1–1.0)
Monocytes Relative: 5 %
Neutro Abs: 5.7 10*3/uL (ref 1.7–7.7)
Neutrophils Relative %: 72 %
Platelets: 72 10*3/uL — ABNORMAL LOW (ref 150–400)
RBC: 3.91 MIL/uL (ref 3.87–5.11)
RDW: 15.5 % (ref 11.5–15.5)
WBC: 7.8 10*3/uL (ref 4.0–10.5)
nRBC: 0 % (ref 0.0–0.2)

## 2021-11-16 NOTE — Progress Notes (Signed)
Pt states she will like to discuss spots on her abdomen.

## 2021-11-17 ENCOUNTER — Inpatient Hospital Stay: Payer: 59

## 2021-11-17 NOTE — Progress Notes (Signed)
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    INTERVAL HISTORY: Patient returns to clinic today for further evaluation and reconsideration of cycle 2 of Taxotere, carboplatinum, Herceptin, and Perjeta.  Her loose stools that initially improved, but then over this past week it got slightly worse.  She took Lomotil this morning with improvement of her symptoms.  She is also having issues with hemorrhoids.  She otherwise feels well.  Her surgical wound still requires packing and is slowly healing, but has not become worse.  She has no neurologic complaints.  She denies any recent fevers or illnesses.  She has a good appetite and denies weight loss.  She has no chest pain, shortness of breath, cough, or hemoptysis.  She denies any nausea, vomiting, or constipation.  She has no urinary complaints.  Patient offers no further specific complaints today.  REVIEW OF SYSTEMS:   Review of Systems  Constitutional: Negative.  Negative for fever, malaise/fatigue and weight loss.  Respiratory: Negative.  Negative for cough and shortness of breath.   Cardiovascular: Negative.  Negative for chest pain and leg swelling.  Gastrointestinal: Negative.  Negative for abdominal pain and diarrhea.  Genitourinary: Negative.  Negative for dysuria.  Musculoskeletal: Negative.  Negative for back pain.  Skin: Negative.  Negative for rash.  Neurological: Negative.  Negative for dizziness, focal weakness, weakness and headaches.  Psychiatric/Behavioral: Negative.  The  patient is not nervous/anxious.    As per HPI. Otherwise, a complete review of systems is negative.  PAST MEDICAL HISTORY: Past Medical History:  Diagnosis Date   Anxiety    Aortic stenosis    Diabetes mellitus    Edema    Heart murmur    Hypertension    Sleep apnea    Thyroid disease     PAST SURGICAL HISTORY: Past Surgical History:  Procedure Laterality Date   ABDOMINAL HYSTERECTOMY  11/17/2013   BREAST BIOPSY     x2   BREAST BIOPSY Left 07/20/2021   US Bx, Ribbon Clip, Path pending   BREAST BIOPSY Left 07/20/2021   Stereo Bx, X-clip, path pending   BREAST BIOPSY Left 07/20/2021   Stereo biopsy, coil clip, path pending   BREAST BIOPSY Right 07/20/2021   Stereo Bx, Ribbon Clip, path pending   CESAREAN SECTION     x 2   CHOLECYSTECTOMY  12/17/2001   DUE TO STONES   DILATION AND CURETTAGE OF UTERUS     ELBOW SURGERY     POLYPECTOMY  12/17/1993   PORTACATH PLACEMENT Right 10/25/2021   Procedure: INSERTION PORT-A-CATH;  Surgeon: Rodenberg, Denny, MD;  Location: [facility] ORS;  Service: General;  Laterality: Right;   SIMPLE MASTECTOMY WITH AXILLARY SENTINEL NODE BIOPSY Left 08/16/2021   Procedure: SIMPLE MASTECTOMY WITH AXILLARY SENTINEL NODE BIOPSY;  Surgeon: Rodenberg, Denny, MD;  Location: [facility] ORS;  Service: General;  Laterality: Left;    FAMILY HISTORY: Family History  Problem Relation Age of Onset   Hyperlipidemia Mother    Thyroid disease Mother    Hashimoto's thyroiditis Mother    Heart disease Father        open   heart surgery   Heart failure Father    Diabetes Father    Dementia Father    Thyroid disease Brother    Breast cancer Maternal Grandmother    Diabetes Paternal Grandmother    Prostate cancer Paternal Grandfather     ADVANCED DIRECTIVES (Y/N):  N  HEALTH MAINTENANCE: Social History   Tobacco Use   Smoking status: Former    Packs/day: 1.50    Years: 25.00    Pack years: 37.50    Types: Cigarettes    Quit date: 11/24/2001    Years since  quitting: 20.0   Smokeless tobacco: Never  Vaping Use   Vaping Use: Never used  Substance Use Topics   Alcohol use: Yes    Alcohol/week: 0.0 standard drinks    Comment: OCCASIONALLY DRINKS WINE, ONCE A WEEK   Drug use: No     Colonoscopy:  PAP:  Bone density:  Lipid panel:  Allergies  Allergen Reactions   Fosaprepitant Nausea Only and Other (See Comments)    Back pain, nausea. See progress note from 10/27/2021     Current Outpatient Medications  Medication Sig Dispense Refill   Ascorbic Acid (VITAMIN C PO) Take 1 tablet by mouth daily.     aspirin 81 MG EC tablet Take 81 mg by mouth at bedtime.     carvedilol (COREG) 25 MG tablet Take 1 tablet (25 mg total) by mouth 2 (two) times daily. 180 tablet 1   Cholecalciferol (VITAMIN D3) 1000 UNITS CAPS Take 1,000 Units by mouth in the morning.     diphenoxylate-atropine (LOMOTIL) 2.5-0.025 MG tablet Take 2 tablets by mouth 4 (four) times daily as needed for diarrhea or loose stools. 60 tablet 1   Dulaglutide (TRULICITY) 3 MB/5.5HR SOPN Inject 3 mg as directed once a week. 2 mL 3   Ferrous Sulfate (IRON) 325 (65 Fe) MG TABS Take 1 tablet by mouth once daily with breakfast 90 tablet 0   furosemide (LASIX) 40 MG tablet TAKE 1 TABLET BY MOUTH ONCE DAILY AS NEEDED FOR FLUID OR EDEMA 90 tablet 1   glucose blood (CONTOUR NEXT TEST) test strip Test fasting sugar each morning. Recheck if having hypoglycemic symptoms. 100 each 4   glyBURIDE (DIABETA) 5 MG tablet TAKE 1 TABLET BY MOUTH TWICE DAILY WITH MEALS. 90 tablet 1   hydrochlorothiazide (HYDRODIURIL) 25 MG tablet Take 1 tablet (25 mg total) by mouth daily. 90 tablet 1   levothyroxine (EUTHYROX) 75 MCG tablet TAKE 1 TABLET BY MOUTH ONCE DAILY BEFORE BREAKFAST . 90 tablet 1   lidocaine-prilocaine (EMLA) cream Apply to affected area once 30 g 3   lisinopril (ZESTRIL) 40 MG tablet Take 1 tablet (40 mg total) by mouth daily. 90 tablet 1   metFORMIN (GLUCOPHAGE) 1000 MG tablet Take 1 tablet  (1,000 mg total) by mouth 2 (two) times daily with a meal. 180 tablet 1   Multiple Vitamin (MULTIVITAMIN) tablet Take 1 tablet by mouth every morning.     mupirocin ointment (BACTROBAN) 2 % Apply 1 application topically 2 (two) times daily. 22 g 0   PARoxetine (PAXIL) 20 MG tablet Take 1 tablet (20 mg total) by mouth daily. 90 tablet 1   zinc gluconate 50 MG tablet Take 50 mg by mouth daily.     ibuprofen (ADVIL) 800 MG tablet Take 1 tablet (800 mg total) by mouth every 8 (eight) hours as needed. (Patient not taking: Reported on 11/06/2021) 30 tablet 0   ondansetron (ZOFRAN) 8 MG tablet Take 1  tablet (8 mg total) by mouth 2 (two) times daily as needed for refractory nausea / vomiting. (Patient not taking: Reported on 11/16/2021) 60 tablet 2   prochlorperazine (COMPAZINE) 10 MG tablet Take 1 tablet (10 mg total) by mouth every 6 (six) hours as needed (Nausea or vomiting). (Patient not taking: Reported on 11/16/2021) 60 tablet 2   No current facility-administered medications for this visit.    OBJECTIVE: Vitals:   11/23/21 0932  BP: 135/64  Pulse: 60  Resp: 17  Temp: 98.4 F (36.9 C)  SpO2: 100%     Body mass index is 30.73 kg/m.    ECOG FS:0 - Asymptomatic  General: Well-developed, well-nourished, no acute distress. Eyes: Pink conjunctiva, anicteric sclera. HEENT: Normocephalic, moist mucous membranes. Lungs: No audible wheezing or coughing. Heart: Regular rate and rhythm. Abdomen: Soft, nontender, no obvious distention. Musculoskeletal: No edema, cyanosis, or clubbing. Neuro: Alert, answering all questions appropriately. Cranial nerves grossly intact. Skin: No rashes or petechiae noted. Psych: Normal affect.   LAB RESULTS:  Lab Results  Component Value Date   NA 139 11/23/2021   K 3.7 11/23/2021   CL 95 (L) 11/23/2021   CO2 32 11/23/2021   GLUCOSE 150 (H) 11/23/2021   BUN 17 11/23/2021   CREATININE 0.53 11/23/2021   CALCIUM 8.9 11/23/2021   PROT 6.8 11/23/2021   ALBUMIN  4.0 11/23/2021   AST 23 11/23/2021   ALT 22 11/23/2021   ALKPHOS 62 11/23/2021   BILITOT 0.5 11/23/2021   GFRNONAA >60 11/23/2021   GFRAA 111 10/14/2020    Lab Results  Component Value Date   WBC 6.4 11/23/2021   NEUTROABS 4.1 11/23/2021   HGB 10.6 (L) 11/23/2021   HCT 33.9 (L) 11/23/2021   MCV 82.7 11/23/2021   PLT 91 (L) 11/23/2021     STUDIES: DG Chest Port 1 View  Result Date: 10/25/2021 CLINICAL DATA:  Central venous catheter placement EXAM: PORTABLE CHEST 1 VIEW COMPARISON:  01/02/2020 FINDINGS: A power injectable right IJ Port-A-Cath is noted with its tip projecting over the SVC. No pneumothorax or complicating feature. Small clips project over the left axilla. The lungs appear clear. Heart size within normal limits for projection. Atherosclerotic calcification of the aortic arch. IMPRESSION: 1. Right IJ power injectable Port-A-Cath tip: SVC. No pneumothorax or complicating feature identified. 2.  Aortic Atherosclerosis (ICD10-I70.0). Electronically Signed   By: Walter  Liebkemann M.D.   On: 10/25/2021 13:53   DG C-Arm 1-60 Min-No Report  Result Date: 10/25/2021 Fluoroscopy was utilized by the requesting physician.  No radiographic interpretation.    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  Previously, right breast biopsy was negative for malignancy.  Given stage and HER2 status of disease, patient will require adjuvant treatment using Taxotere, carboplatinum, Perjeta, and Herceptin every 3 weeks for 6 cycles with Udenyca support and then maintenance Herceptin and Perjeta every 3 weeks for an entire year.  She will also benefit from letrozole for 5 years at the conclusion of all of her treatments.  MUGA scan from  September 21, 2021 revealed a EF of 69.3%.  Patient has had port placement.  Patient tolerated cycle 1 relatively well only with some increased diarrhea.  Given her persistent thrombocytopenia and loose stool/diarrhea,   will dose reduce carboplatinum and Taxotere.  Proceed cycle 2 of treatment tomorrow.  Return to clinic in 1 week for laboratory work and then in 3 weeks for further evaluation and consideration of cycle 3.   Mastectomy wound: Continue follow-up with surgery as scheduled.  Proceed cautiously with treatment as above. Diarrhea: Post reduction of Taxotere and carboplatin as above.  Patient has been instructed to take Lomotil regularly. Leukopenia: Resolved.  Udenyca as above. 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  Proceed cautiously with treatment tomorrow as above. Fosaprepitant: Patient had an allergic reaction to this premedication and has subsequently been deleted from her treatment regimen.   Patient expressed understanding and was in agreement with this plan. She also understands that She can call clinic at any time with any questions, concerns, or complaints.    Cancer Staging  Carcinoma of left breast [facility]) Staging form: Breast, AJCC 8th Edition - Pathologic stage from 08/30/2021: Stage IB (pT2, pN1a, cM0, G3, ER+, PR+, HER2+, Oncotype DX score: 18) - Signed by Lloyd Huger, MD on 09/13/2021 Stage prefix: Initial diagnosis Multigene prognostic tests performed: Oncotype DX Recurrence score range: Greater than or equal to 11 Histologic grading system: 3 grade system  Lloyd Huger, MD   11/23/2021 12:02 PM

## 2021-11-20 ENCOUNTER — Ambulatory Visit: Payer: 59 | Attending: Family Medicine | Admitting: Occupational Therapy

## 2021-11-20 DIAGNOSIS — M25612 Stiffness of left shoulder, not elsewhere classified: Secondary | ICD-10-CM | POA: Insufficient documentation

## 2021-11-20 DIAGNOSIS — L905 Scar conditions and fibrosis of skin: Secondary | ICD-10-CM | POA: Insufficient documentation

## 2021-11-20 NOTE — Therapy (Signed)
[place] [facility] [address], [place], [address] Phone: [phone]   Fax:  [phone]  Occupational Therapy Treatment  Patient Details  Name: Emily Tapia MRN: [id number] Date of Birth: [birth year] No data recorded  Encounter Date: 11/20/2021   OT End of Session - 11/20/21 1741     Visit Number 0             Past Medical History:  Diagnosis Date   Anxiety    Aortic stenosis    Diabetes mellitus    Edema    Heart murmur    Hypertension    Sleep apnea    Thyroid disease     Past Surgical History:  Procedure Laterality Date   ABDOMINAL HYSTERECTOMY  11/17/2013   BREAST BIOPSY     x2   BREAST BIOPSY Left 07/20/2021   [place] Bx, Ribbon Clip, Path pending   BREAST BIOPSY Left 07/20/2021   Stereo Bx, X-clip, path pending   BREAST BIOPSY Left 07/20/2021   Stereo biopsy, coil clip, path pending   BREAST BIOPSY Right 07/20/2021   Stereo Bx, Ribbon Clip, path pending   CESAREAN SECTION     x 2   CHOLECYSTECTOMY  12/17/2001   DUE TO STONES   DILATION AND CURETTAGE OF UTERUS     ELBOW SURGERY     POLYPECTOMY  12/17/1993   PORTACATH PLACEMENT Right 10/25/2021   Procedure: INSERTION PORT-A-CATH;  Surgeon: Ronny Bacon, MD;  Location: [facility] ORS;  Service: General;  Laterality: Right;   SIMPLE MASTECTOMY WITH AXILLARY SENTINEL NODE BIOPSY Left 08/16/2021   Procedure: SIMPLE MASTECTOMY WITH AXILLARY SENTINEL NODE BIOPSY;  Surgeon: Ronny Bacon, MD;  Location: [facility] ORS;  Service: General;  Laterality: Left;    There were no vitals filed for this visit.   Subjective Assessment - 11/20/21 1740     Subjective  My platlets was low - did not had chemo - labs this Thursday and then Chemo Friday-   arm doing okay -shoulder bother me sometimes if I stretch over head    Currently in Pain? No/denies                 LYMPHEDEMA/ONCOLOGY QUESTIONNAIRE - 11/20/21 0001       Left Upper  Extremity Lymphedema   15 cm Proximal to Olecranon Process 32.5 cm    10 cm Proximal to Olecranon Process 30 cm    Olecranon Process 23.5 cm    15 cm Proximal to Ulnar Styloid Process 23 cm    10 cm Proximal to Ulnar Styloid Process 19.6 cm    Just Proximal to Ulnar Styloid Process 16 cm                     OT SCREEN 09/13/21: Pt arrive with husband - still drain in and on antibiotics. R hand dominant - and follow up appt with surgeon next week. Swelling under L axilla and drain in place, and bandage. Pt show decrease shoulder over head flexion, ABD and ext rotation - did not do any exercises or ROM because of drain. Pt report was told today that she will have probably radiation in future. Pt was ed on lymphedema signs and symptoms ,prevention and treatment. Hand out provided. Base measurements taken this date - see circumference. WNL at this time Pt to follow up with me again in 3 wks    09/26/21 DR Christian Mate NOTE:     Ms. Peale returns  today, she is noted more serous soupy drainage from her eschar.  She otherwise looks great, her drain site is healing well and is clean.  There is no areas of fluctuance consistent with residual seroma.  She does note some twinges of tightness feeling in her left pectoral deltoid region, likely due to not working on her range of motion.   The eschar of the medial most aspect of her left chest wall incision from her mastectomy is a very well demarcated, and we will proceed with excision of the eschar today and initiate open wound care.   With informed consent we placed her in a supine position.  I prepped the area with Betadine and we used a fenestrated drape.  With sterile technique I proceeded with sharp excision of the eschar which was markedly undergone liquefactive necrosis in 1 area, and very dry over the lower perimeter.  I was able to define a good line of division excising away of this necrotic tissue from the adjacent viable tissue.  She  tolerated this very well without any additional local anesthetic.  Hemostasis was also easily controlled.  Is allowed drainage of the underlying soft tissues.  I marked out this cavity with dry gauze, and subsequently packed it moistened it with normal saline solution.   We gave instructions for local wound care to be done once to twice daily as needed.  I believe she will initiate saline dressing changes and follow-up in a week to 10 days to evaluate the progress of her wound.   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-  She arrive this date with some reports of pull from her armpit into her elbow and forearm - appear pt have some cording - release  it in axilla with less pull in upper arm but still continues into anterior elbow to forearm - but unable to release further - painfull - will check on it again in 2 wks Pt's L upper arm circumference increase by 1 cm this date compare to L - will monitor- appt with surgeon on 20th check up on would care- Pt can do some below 90 degrees for shoulder and elbow AROM glides -and ask surgeon if can do supine - over head AAROM shoulder flexion and ABD  Follow up in 2 wks again    OT SCREEN 09/17/21: Pt report she is working 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 - desk little high - and feet on foot stool already Wound still not closed on L chest - keeping bandage on it and cleaning it Report this date still pull inside of arm from elbow into forearm - upper arm better - pt with cording - release  it in axilla  last time with less pull in upper arm but still continues into anterior elbow to forearm - this date did get on release at elbow - will check on it again in 2 wks Pt's L upper arm circumference   compare to R UE WNL - will monitor- appt with surgeon  tomorrow again  Did get verbal order from surgeon to do over head ROM -  Initiated this date shoulder flexion and horizontal ABD in supine using golf  club - as well as ext rotation over head in supine   12 reps - add to HEP  As well as massage by husband L upper traps -and do cervical lateral flexion to the R - 10 reps Shoulder flexion 135 - and ABD compensate into some flexion- pain with ext rotation to back of head and int rotation lower back  Follow up in 2 wks again        DR [facility] 10/26/21 ASSESSMENT: 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  Previously, right breast biopsy was negative for malignancy.  Given stage and HER2 status of disease, patient will require adjuvant treatment using Taxotere, carboplatinum, Perjeta, and Herceptin every 3 weeks for 6 cycles with Udenyca support and then maintenance Herceptin and Perjeta every 3 weeks for an entire year.  She will also benefit from letrozole for 5 years at the conclusion of all of her treatments.  MUGA scan from September 21, 2021 revealed a EF of 69.3%.  Patient has had port placement.  Return to clinic tomorrow for cycle 1 of treatment.  Patient will then return to clinic in 1 week for laboratory work and further evaluation and then in 3 weeks for laboratory work and evaluation on day 1 and cycle 2 of chemotherapy on day 2.   Mastectomy wound: Continue follow-up with surgery as scheduled.  Proceed cautiously with treatment as above     OT SCREEN 10/30/21: Pt  started chemo this past Friday -and still working 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 - desk little high - and feet on foot stool already Wound  closing slowly - small area  open on medial side- keeping bandage on it and cleaning it Report this date that her ROM in L arm is so much better the pulling in the arm - nothing in the upper arm - little bit distal to elbow  Done some myofascial release on elbow into forearm -had 3 releases after few attempts with less cording in forearm in ABD Pt's L upper arm circumference  compare to R UE WNL lat time- will monitor- appt with surgeon  tomorrow again  Shoulder flexion and ABD 155 this date -and ext rotation [facility] - little tight still in ext rotation end range  And tight in upper traps on L  Recieved verbal order from surgeon to do over head ROM 2 wks ago  Initiated  last time  shoulder flexion and horizontal ABD in supine using golf club - as well as ext rotation over head in supine   12 reps pt to cont with   As well as massage by husband L upper traps -and do cervical lateral flexion to the R - 10 reps Add YTB for scapula squeezes 2 x 15 reps  And shoulder extention 2  x 15 reps 1 x day -can increase to 3rd set in week  Follow up in 3 wks   11/20/21 OT SCREEN:  Pt  report she could not had chemo this past week - Still working 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 - desk little high - and feet on foot stool already Wound  closing slowly- not closed yet  - keeping bandage on it and cleaning  it Report this date that her ROM in L arm is so much better  Only slight pull in shoulder when reaching overhead- tight in the pect - wound healing slow and could not do any over head ROM for while  Pt's L upper arm circumference  compare to R UE WNL l  Shoulder flexion and ABD 155 this date -and ext rotation [facility] - little tight still in ext rotation end range  Received verbal order from surgeon to do over head ROM  This date initiated pect stretch  gentle -and then AAROM on wall for shoulder flexion and ABD  10  reps Upgrade to RTB for scapula squeezes 2 x 12reps  And shoulder extention 2  x 12 reps 1 x day -can increase to 3rd set  in week  Follow up in about 4 wks                               Patient will benefit from skilled therapeutic intervention in order to improve the following deficits and impairments:           Visit Diagnosis: Stiffness of left shoulder, not elsewhere classified  Scar condition and fibrosis of skin    Problem List Patient Active Problem List   Diagnosis Date Noted   Malignant neoplasm of upper-inner quadrant of left breast in female, estrogen receptor positive ([place]) 10/23/2021   Carcinoma of left breast ([place]) 08/30/2021   Status post left mastectomy 08/22/2021   Other peripheral vertigo, unspecified ear 01/02/2020   Pelvic mass 07/08/2015   Hyperlipidemia, mixed 07/08/2015   Hx of iron deficiency 07/08/2015   Absolute anemia 04/27/2015   Diabetic peripheral neuropathy associated with type 2 diabetes mellitus ([place]) 04/27/2015   Breath shortness 04/27/2015   Accumulation of fluid in tissues 04/27/2015   Cardiac murmur 04/27/2015   HLD (hyperlipidemia) 04/27/2015   Decreased potassium in the blood 04/27/2015   Hypothyroidism 04/27/2015   Obstructive sleep apnea 04/27/2015   Disorder of peripheral nervous system 04/27/2015   Obstructive apnea 04/27/2015   Pain in shoulder 04/27/2015   Fibroid uterus 10/25/2013   Aortic valve stenosis 05/01/2011   Edema 03/27/2011   SOB (shortness of breath) 03/27/2011   HTN (hypertension) 03/27/2011   Essential (primary) hypertension 07/05/2006   Anxiety, generalized 07/05/2006    Rosalyn Gess, OTR/L,CLT 11/20/2021, 5:41 PM  [facility] [address], [place], [address] Phone: [phone]   Fax:  [phone]  Name: Emily Tapia MRN: [id number] Date of Birth: Mar 13, 1961

## 2021-11-23 ENCOUNTER — Other Ambulatory Visit: Payer: Self-pay

## 2021-11-23 ENCOUNTER — Inpatient Hospital Stay: Payer: 59

## 2021-11-23 ENCOUNTER — Encounter: Payer: Self-pay | Admitting: Oncology

## 2021-11-23 ENCOUNTER — Inpatient Hospital Stay (HOSPITAL_BASED_OUTPATIENT_CLINIC_OR_DEPARTMENT_OTHER): Payer: 59 | Admitting: Oncology

## 2021-11-23 VITALS — BP 135/64 | HR 60 | Temp 98.4°F | Resp 17 | Wt 179.0 lb

## 2021-11-23 DIAGNOSIS — Z170 Estrogen receptor positive status [ER+]: Secondary | ICD-10-CM | POA: Diagnosis not present

## 2021-11-23 DIAGNOSIS — C50812 Malignant neoplasm of overlapping sites of left female breast: Secondary | ICD-10-CM

## 2021-11-23 DIAGNOSIS — Z5112 Encounter for antineoplastic immunotherapy: Secondary | ICD-10-CM | POA: Diagnosis not present

## 2021-11-23 LAB — COMPREHENSIVE METABOLIC PANEL
ALT: 22 U/L (ref 0–44)
AST: 23 U/L (ref 15–41)
Albumin: 4 g/dL (ref 3.5–5.0)
Alkaline Phosphatase: 62 U/L (ref 38–126)
Anion gap: 12 (ref 5–15)
BUN: 17 mg/dL (ref 8–23)
CO2: 32 mmol/L (ref 22–32)
Calcium: 8.9 mg/dL (ref 8.9–10.3)
Chloride: 95 mmol/L — ABNORMAL LOW (ref 98–111)
Creatinine, Ser: 0.53 mg/dL (ref 0.44–1.00)
GFR, Estimated: >60 mL/min (ref >60)
Glucose, Bld: 150 mg/dL — ABNORMAL HIGH (ref 70–99)
Potassium: 3.7 mmol/L (ref 3.5–5.1)
Sodium: 139 mmol/L (ref 135–145)
Total Bilirubin: 0.5 mg/dL (ref 0.3–1.2)
Total Protein: 6.8 g/dL (ref 6.5–8.1)

## 2021-11-23 LAB — CBC WITH DIFFERENTIAL/PLATELET
Abs Immature Granulocytes: 0.03 10*3/uL (ref 0.00–0.07)
Basophils Absolute: 0 10*3/uL (ref 0.0–0.1)
Basophils Relative: 1 %
Eosinophils Absolute: 0.2 10*3/uL (ref 0.0–0.5)
Eosinophils Relative: 3 %
HCT: 33.9 % — ABNORMAL LOW (ref 36.0–46.0)
Hemoglobin: 10.6 g/dL — ABNORMAL LOW (ref 12.0–15.0)
Immature Granulocytes: 1 %
Lymphocytes Relative: 27 %
Lymphs Abs: 1.7 10*3/uL (ref 0.7–4.0)
MCH: 25.9 pg — ABNORMAL LOW (ref 26.0–34.0)
MCHC: 31.3 g/dL (ref 30.0–36.0)
MCV: 82.7 fL (ref 80.0–100.0)
Monocytes Absolute: 0.4 10*3/uL (ref 0.1–1.0)
Monocytes Relative: 6 %
Neutro Abs: 4.1 10*3/uL (ref 1.7–7.7)
Neutrophils Relative %: 62 %
Platelets: 91 10*3/uL — ABNORMAL LOW (ref 150–400)
RBC: 4.1 MIL/uL (ref 3.87–5.11)
RDW: 16.7 % — ABNORMAL HIGH (ref 11.5–15.5)
WBC: 6.4 10*3/uL (ref 4.0–10.5)
nRBC: 0 % (ref 0.0–0.2)

## 2021-11-23 NOTE — Progress Notes (Signed)
Patient here for oncology follow-up appointment, concerns of nausea, diarrhea and bleeding hemorrhoids

## 2021-11-24 ENCOUNTER — Inpatient Hospital Stay: Payer: 59

## 2021-11-24 VITALS — BP 143/69 | HR 71 | Temp 97.8°F | Resp 18 | Wt 179.0 lb

## 2021-11-24 DIAGNOSIS — Z170 Estrogen receptor positive status [ER+]: Secondary | ICD-10-CM

## 2021-11-24 DIAGNOSIS — Z5112 Encounter for antineoplastic immunotherapy: Secondary | ICD-10-CM | POA: Diagnosis not present

## 2021-11-24 DIAGNOSIS — Z95828 Presence of other vascular implants and grafts: Secondary | ICD-10-CM

## 2021-11-24 MED ORDER — SODIUM CHLORIDE 0.9 % IV SOLN
65.0000 mg/m2 | Freq: Once | INTRAVENOUS | Status: AC
  Administered 2021-11-24: 130 mg via INTRAVENOUS
  Filled 2021-11-24: qty 13

## 2021-11-24 MED ORDER — DIPHENHYDRAMINE HCL 25 MG PO CAPS
25.0000 mg | ORAL_CAPSULE | Freq: Once | ORAL | Status: AC
  Administered 2021-11-24: 25 mg via ORAL
  Filled 2021-11-24: qty 1

## 2021-11-24 MED ORDER — SODIUM CHLORIDE 0.9 % IV SOLN
616.0000 mg | Freq: Once | INTRAVENOUS | Status: AC
  Administered 2021-11-24: 620 mg via INTRAVENOUS
  Filled 2021-11-24: qty 62

## 2021-11-24 MED ORDER — SODIUM CHLORIDE 0.9 % IV SOLN
Freq: Once | INTRAVENOUS | Status: AC
  Filled 2021-11-24: qty 250

## 2021-11-24 MED ORDER — PALONOSETRON HCL INJECTION 0.25 MG/5ML
0.2500 mg | Freq: Once | INTRAVENOUS | Status: AC
  Administered 2021-11-24: 0.25 mg via INTRAVENOUS
  Filled 2021-11-24: qty 5

## 2021-11-24 MED ORDER — SODIUM CHLORIDE 0.9 % IV SOLN
420.0000 mg | Freq: Once | INTRAVENOUS | Status: AC
  Administered 2021-11-24: 420 mg via INTRAVENOUS
  Filled 2021-11-24: qty 14

## 2021-11-24 MED ORDER — TRASTUZUMAB-DKST CHEMO 150 MG IV SOLR
6.0000 mg/kg | Freq: Once | INTRAVENOUS | Status: AC
  Administered 2021-11-24: 504 mg via INTRAVENOUS
  Filled 2021-11-24: qty 24

## 2021-11-24 MED ORDER — ACETAMINOPHEN 325 MG PO TABS
650.0000 mg | ORAL_TABLET | Freq: Once | ORAL | Status: AC
  Administered 2021-11-24: 650 mg via ORAL
  Filled 2021-11-24: qty 2

## 2021-11-24 MED ORDER — SODIUM CHLORIDE 0.9 % IV SOLN
10.0000 mg | Freq: Once | INTRAVENOUS | Status: AC
  Administered 2021-11-24: 10 mg via INTRAVENOUS
  Filled 2021-11-24: qty 10

## 2021-11-24 MED ORDER — HEPARIN SOD (PORK) LOCK FLUSH 100 UNIT/ML IV SOLN
500.0000 [IU] | Freq: Once | INTRAVENOUS | Status: AC
  Administered 2021-11-24: 500 [IU] via INTRAVENOUS
  Filled 2021-11-24: qty 5

## 2021-11-24 NOTE — Patient Instructions (Signed)
[facility] AT [place]  Discharge Instructions: Thank you for choosing [facility] to provide your oncology and hematology care.  If you have a lab appointment with the [facility], please go directly to the [facility] and check in at the registration area.  Wear comfortable clothing and clothing appropriate for easy access to any Portacath or PICC line.   We strive to give you quality time with your provider. You may need to reschedule your appointment if you arrive late (15 or more minutes).  Arriving late affects you and other patients whose appointments are after yours.  Also, if you miss three or more appointments without notifying the office, you may be dismissed from the clinic at the provider's discretion.      For prescription refill requests, have your pharmacy contact our office and allow 72 hours for refills to be completed.    Today you received the following chemotherapy and/or immunotherapy agents Ogiviri, Perjeta, Taxotere, Carboplatin      To help prevent nausea and vomiting after your treatment, we encourage you to take your nausea medication as directed.  BELOW ARE SYMPTOMS THAT SHOULD BE REPORTED IMMEDIATELY: *FEVER GREATER THAN 100.4 F (38 C) OR HIGHER *CHILLS OR SWEATING *NAUSEA AND VOMITING THAT IS NOT CONTROLLED WITH YOUR NAUSEA MEDICATION *UNUSUAL SHORTNESS OF BREATH *UNUSUAL BRUISING OR BLEEDING *URINARY PROBLEMS (pain or burning when urinating, or frequent urination) *BOWEL PROBLEMS (unusual diarrhea, constipation, pain near the anus) TENDERNESS IN MOUTH AND THROAT WITH OR WITHOUT PRESENCE OF ULCERS (sore throat, sores in mouth, or a toothache) UNUSUAL RASH, SWELLING OR PAIN  UNUSUAL VAGINAL DISCHARGE OR ITCHING   Items with * indicate a potential emergency and should be followed up as soon as possible or go to the Emergency Department if any problems should occur.  Please show the CHEMOTHERAPY ALERT CARD or  IMMUNOTHERAPY ALERT CARD at check-in to the Emergency Department and triage nurse.  Should you have questions after your visit or need to cancel or reschedule your appointment, please contact [facility] [place] AT [place]  [phone] and follow the prompts.  Office hours are 8:00 a.m. to 4:30 p.m. Monday - Friday. Please note that voicemails left after 4:00 p.m. may not be returned until the following business day.  We are closed weekends and major holidays. You have access to a nurse at all times for urgent questions. Please call the main number to the clinic [phone] and follow the prompts.  For any non-urgent questions, you may also contact your provider using MyChart. We now offer e-Visits for anyone 42 and older to request care online for non-urgent symptoms. For details visit [email or web address].   Also download the MyChart app! Go to the app store, search "MyChart", open the app, select [facility], and log in with your MyChart username and password.  Due to Covid, a mask is required upon entering the hospital/clinic. If you do not have a mask, one will be given to you upon arrival. For doctor visits, patients may have 1 support person aged 72 or older with them. For treatment visits, patients cannot have anyone with them due to current Covid guidelines and our immunocompromised population.   Trastuzumab injection for infusion What is this medication? TRASTUZUMAB (tras TOO zoo mab) is a monoclonal antibody. It is used to treat breast cancer and stomach cancer. This medicine may be used for other purposes; ask your health care provider or pharmacist if you have questions. COMMON BRAND NAME(S): Herceptin, Belenda Cruise,  Garvin Fila What should I tell my care team before I take this medication? They need to know if you have any of these conditions: heart disease heart failure lung or breathing disease, like asthma an unusual or allergic reaction  to trastuzumab, benzyl alcohol, or other medications, foods, dyes, or preservatives pregnant or trying to get pregnant breast-feeding How should I use this medication? This drug is given as an infusion into a vein. It is administered in a hospital or clinic by a specially trained health care professional. Talk to your pediatrician regarding the use of this medicine in children. This medicine is not approved for use in children. Overdosage: If you think you have taken too much of this medicine contact a poison control center or emergency room at once. NOTE: This medicine is only for you. Do not share this medicine with others. What if I miss a dose? It is important not to miss a dose. Call your doctor or health care professional if you are unable to keep an appointment. What may interact with this medication? This medicine may interact with the following medications: certain types of chemotherapy, such as daunorubicin, doxorubicin, epirubicin, and idarubicin This list may not describe all possible interactions. Give your health care provider a list of all the medicines, herbs, non-prescription drugs, or dietary supplements you use. Also tell them if you smoke, drink alcohol, or use illegal drugs. Some items may interact with your medicine. What should I watch for while using this medication? Visit your doctor for checks on your progress. Report any side effects. Continue your course of treatment even though you feel ill unless your doctor tells you to stop. Call your doctor or health care professional for advice if you get a fever, chills or sore throat, or other symptoms of a cold or flu. Do not treat yourself. Try to avoid being around people who are sick. You may experience fever, chills and shaking during your first infusion. These effects are usually mild and can be treated with other medicines. Report any side effects during the infusion to your health care professional. Fever and chills  usually do not happen with later infusions. Do not become pregnant while taking this medicine or for 7 months after stopping it. Women should inform their doctor if they wish to become pregnant or think they might be pregnant. Women of child-bearing potential will need to have a negative pregnancy test before starting this medicine. There is a potential for serious side effects to an unborn child. Talk to your health care professional or pharmacist for more information. Do not breast-feed an infant while taking this medicine or for 7 months after stopping it. Women must use effective birth control with this medicine. What side effects may I notice from receiving this medication? Side effects that you should report to your doctor or health care professional as soon as possible: allergic reactions like skin rash, itching or hives, swelling of the face, lips, or tongue chest pain or palpitations cough dizziness feeling faint or lightheaded, falls fever general ill feeling or flu-like symptoms signs of worsening heart failure like breathing problems; swelling in your legs and feet unusually weak or tired Side effects that usually do not require medical attention (report to your doctor or health care professional if they continue or are bothersome): bone pain changes in taste diarrhea joint pain nausea/vomiting weight loss This list may not describe all possible side effects. Call your doctor for medical advice about side effects. You may report  side effects to FDA at 1-800-FDA-1088. Where should I keep my medication? This drug is given in a hospital or clinic and will not be stored at home. NOTE: This sheet is a summary. It may not cover all possible information. If you have questions about this medicine, talk to your doctor, pharmacist, or health care provider.  2022 Elsevier/Gold Standard (2016-12-18 00:00:00)  Pertuzumab injection What is this medication? PERTUZUMAB (per TOOZ ue mab) is  a monoclonal antibody. It is used to treat breast cancer. This medicine may be used for other purposes; ask your health care provider or pharmacist if you have questions. COMMON BRAND NAME(S): PERJETA What should I tell my care team before I take this medication? They need to know if you have any of these conditions: heart disease heart failure high blood pressure history of irregular heart beat recent or ongoing radiation therapy an unusual or allergic reaction to pertuzumab, other medicines, foods, dyes, or preservatives pregnant or trying to get pregnant breast-feeding How should I use this medication? This medicine is for infusion into a vein. It is given by a health care professional in a hospital or clinic setting. Talk to your pediatrician regarding the use of this medicine in children. Special care may be needed. Overdosage: If you think you have taken too much of this medicine contact a poison control center or emergency room at once. NOTE: This medicine is only for you. Do not share this medicine with others. What if I miss a dose? It is important not to miss your dose. Call your doctor or health care professional if you are unable to keep an appointment. What may interact with this medication? Interactions are not expected. Give your health care provider a list of all the medicines, herbs, non-prescription drugs, or dietary supplements you use. Also tell them if you smoke, drink alcohol, or use illegal drugs. Some items may interact with your medicine. This list may not describe all possible interactions. Give your health care provider a list of all the medicines, herbs, non-prescription drugs, or dietary supplements you use. Also tell them if you smoke, drink alcohol, or use illegal drugs. Some items may interact with your medicine. What should I watch for while using this medication? Your condition will be monitored carefully while you are receiving this medicine. Report any  side effects. Continue your course of treatment even though you feel ill unless your doctor tells you to stop. Do not become pregnant while taking this medicine or for 7 months after stopping it. Women should inform their doctor if they wish to become pregnant or think they might be pregnant. Women of child-bearing potential will need to have a negative pregnancy test before starting this medicine. There is a potential for serious side effects to an unborn child. Talk to your health care professional or pharmacist for more information. Do not breast-feed an infant while taking this medicine or for 7 months after stopping it. Women must use effective birth control with this medicine. Call your doctor or health care professional for advice if you get a fever, chills or sore throat, or other symptoms of a cold or flu. Do not treat yourself. Try to avoid being around people who are sick. You may experience fever, chills, and headache during the infusion. Report any side effects during the infusion to your health care professional. What side effects may I notice from receiving this medication? Side effects that you should report to your doctor or health care professional as soon as  possible: breathing problems chest pain or palpitations dizziness feeling faint or lightheaded fever or chills skin rash, itching or hives sore throat swelling of the face, lips, or tongue swelling of the legs or ankles unusually weak or tired Side effects that usually do not require medical attention (report to your doctor or health care professional if they continue or are bothersome): diarrhea hair loss nausea, vomiting tiredness This list may not describe all possible side effects. Call your doctor for medical advice about side effects. You may report side effects to FDA at 1-800-FDA-1088. Where should I keep my medication? This drug is given in a hospital or clinic and will not be stored at home. NOTE: This sheet  is a summary. It may not cover all possible information. If you have questions about this medicine, talk to your doctor, pharmacist, or health care provider.  2022 Elsevier/Gold Standard (2016-01-05 00:00:00)  Docetaxel injection What is this medication? DOCETAXEL (doe se TAX el) is a chemotherapy drug. It targets fast dividing cells, like cancer cells, and causes these cells to die. This medicine is used to treat many types of cancers like breast cancer, certain stomach cancers, head and neck cancer, lung cancer, and prostate cancer. This medicine may be used for other purposes; ask your health care provider or pharmacist if you have questions. COMMON BRAND NAME(S): Docefrez, Taxotere What should I tell my care team before I take this medication? They need to know if you have any of these conditions: infection (especially a virus infection such as chickenpox, cold sores, or herpes) liver disease low blood counts, like low white cell, platelet, or red cell counts an unusual or allergic reaction to docetaxel, polysorbate 80, other chemotherapy agents, other medicines, foods, dyes, or preservatives pregnant or trying to get pregnant breast-feeding How should I use this medication? This drug is given as an infusion into a vein. It is administered in a hospital or clinic by a specially trained health care professional. Talk to your pediatrician regarding the use of this medicine in children. Special care may be needed. Overdosage: If you think you have taken too much of this medicine contact a poison control center or emergency room at once. NOTE: This medicine is only for you. Do not share this medicine with others. What if I miss a dose? It is important not to miss your dose. Call your doctor or health care professional if you are unable to keep an appointment. What may interact with this medication? Do not take this medicine with any of the following medications: live virus vaccines This  medicine may also interact with the following medications: aprepitant certain antibiotics like erythromycin or clarithromycin certain antivirals for HIV or hepatitis certain medicines for fungal infections like fluconazole, itraconazole, ketoconazole, posaconazole, or voriconazole cimetidine ciprofloxacin conivaptan cyclosporine dronedarone fluvoxamine grapefruit juice imatinib verapamil This list may not describe all possible interactions. Give your health care provider a list of all the medicines, herbs, non-prescription drugs, or dietary supplements you use. Also tell them if you smoke, drink alcohol, or use illegal drugs. Some items may interact with your medicine. What should I watch for while using this medication? Your condition will be monitored carefully while you are receiving this medicine. You will need important blood work done while you are taking this medicine. Call your doctor or health care professional for advice if you get a fever, chills or sore throat, or other symptoms of a cold or flu. Do not treat yourself. This drug decreases your body's ability  to fight infections. Try to avoid being around people who are sick. Some products may contain alcohol. Ask your health care professional if this medicine contains alcohol. Be sure to tell all health care professionals you are taking this medicine. Certain medicines, like metronidazole and disulfiram, can cause an unpleasant reaction when taken with alcohol. The reaction includes flushing, headache, nausea, vomiting, sweating, and increased thirst. The reaction can last from 30 minutes to several hours. You may get drowsy or dizzy. Do not drive, use machinery, or do anything that needs mental alertness until you know how this medicine affects you. Do not stand or sit up quickly, especially if you are an older patient. This reduces the risk of dizzy or fainting spells. Alcohol may interfere with the effect of this medicine. Talk to  your health care professional about your risk of cancer. You may be more at risk for certain types of cancer if you take this medicine. Do not become pregnant while taking this medicine or for 6 months after stopping it. Women should inform their doctor if they wish to become pregnant or think they might be pregnant. There is a potential for serious side effects to an unborn child. Talk to your health care professional or pharmacist for more information. Do not breast-feed an infant while taking this medicine or for 1 week after stopping it. Males who get this medicine must use a condom during sex with females who can get pregnant. If you get a woman pregnant, the baby could have birth defects. The baby could die before they are born. You will need to continue wearing a condom for 3 months after stopping the medicine. Tell your health care provider right away if your partner becomes pregnant while you are taking this medicine. This may interfere with the ability to father a child. You should talk to your doctor or health care professional if you are concerned about your fertility. What side effects may I notice from receiving this medication? Side effects that you should report to your doctor or health care professional as soon as possible: allergic reactions like skin rash, itching or hives, swelling of the face, lips, or tongue blurred vision breathing problems changes in vision low blood counts - This drug may decrease the number of white blood cells, red blood cells and platelets. You may be at increased risk for infections and bleeding. nausea and vomiting pain, redness or irritation at site where injected pain, tingling, numbness in the hands or feet redness, blistering, peeling, or loosening of the skin, including inside the mouth signs of decreased platelets or bleeding - bruising, pinpoint red spots on the skin, black, tarry stools, nosebleeds signs of decreased red blood cells - unusually  weak or tired, fainting spells, lightheadedness signs of infection - fever or chills, cough, sore throat, pain or difficulty passing urine swelling of the ankle, feet, hands Side effects that usually do not require medical attention (report to your doctor or health care professional if they continue or are bothersome): constipation diarrhea fingernail or toenail changes hair loss loss of appetite mouth sores muscle pain This list may not describe all possible side effects. Call your doctor for medical advice about side effects. You may report side effects to FDA at 1-800-FDA-1088. Where should I keep my medication? This drug is given in a hospital or clinic and will not be stored at home. NOTE: This sheet is a summary. It may not cover all possible information. If you have questions about this medicine,  talk to your doctor, pharmacist, or health care provider.  2022 Elsevier/Gold Standard (2021-08-22 00:00:00)  Carboplatin injection What is this medication? CARBOPLATIN (KAR boe pla tin) is a chemotherapy drug. It targets fast dividing cells, like cancer cells, and causes these cells to die. This medicine is used to treat ovarian cancer and many other cancers. This medicine may be used for other purposes; ask your health care provider or pharmacist if you have questions. COMMON BRAND NAME(S): Paraplatin What should I tell my care team before I take this medication? They need to know if you have any of these conditions: blood disorders hearing problems kidney disease recent or ongoing radiation therapy an unusual or allergic reaction to carboplatin, cisplatin, other chemotherapy, other medicines, foods, dyes, or preservatives pregnant or trying to get pregnant breast-feeding How should I use this medication? This drug is usually given as an infusion into a vein. It is administered in a hospital or clinic by a specially trained health care professional. Talk to your pediatrician  regarding the use of this medicine in children. Special care may be needed. Overdosage: If you think you have taken too much of this medicine contact a poison control center or emergency room at once. NOTE: This medicine is only for you. Do not share this medicine with others. What if I miss a dose? It is important not to miss a dose. Call your doctor or health care professional if you are unable to keep an appointment. What may interact with this medication? medicines for seizures medicines to increase blood counts like filgrastim, pegfilgrastim, sargramostim some antibiotics like amikacin, gentamicin, neomycin, streptomycin, tobramycin vaccines Talk to your doctor or health care professional before taking any of these medicines: acetaminophen aspirin ibuprofen ketoprofen naproxen This list may not describe all possible interactions. Give your health care provider a list of all the medicines, herbs, non-prescription drugs, or dietary supplements you use. Also tell them if you smoke, drink alcohol, or use illegal drugs. Some items may interact with your medicine. What should I watch for while using this medication? Your condition will be monitored carefully while you are receiving this medicine. You will need important blood work done while you are taking this medicine. This drug may make you feel generally unwell. This is not uncommon, as chemotherapy can affect healthy cells as well as cancer cells. Report any side effects. Continue your course of treatment even though you feel ill unless your doctor tells you to stop. In some cases, you may be given additional medicines to help with side effects. Follow all directions for their use. Call your doctor or health care professional for advice if you get a fever, chills or sore throat, or other symptoms of a cold or flu. Do not treat yourself. This drug decreases your body's ability to fight infections. Try to avoid being around people who are  sick. This medicine may increase your risk to bruise or bleed. Call your doctor or health care professional if you notice any unusual bleeding. Be careful brushing and flossing your teeth or using a toothpick because you may get an infection or bleed more easily. If you have any dental work done, tell your dentist you are receiving this medicine. Avoid taking products that contain aspirin, acetaminophen, ibuprofen, naproxen, or ketoprofen unless instructed by your doctor. These medicines may hide a fever. Do not become pregnant while taking this medicine. Women should inform their doctor if they wish to become pregnant or think they might be pregnant. There is a  potential for serious side effects to an unborn child. Talk to your health care professional or pharmacist for more information. Do not breast-feed an infant while taking this medicine. What side effects may I notice from receiving this medication? Side effects that you should report to your doctor or health care professional as soon as possible: allergic reactions like skin rash, itching or hives, swelling of the face, lips, or tongue signs of infection - fever or chills, cough, sore throat, pain or difficulty passing urine signs of decreased platelets or bleeding - bruising, pinpoint red spots on the skin, black, tarry stools, nosebleeds signs of decreased red blood cells - unusually weak or tired, fainting spells, lightheadedness breathing problems changes in hearing changes in vision chest pain high blood pressure low blood counts - This drug may decrease the number of white blood cells, red blood cells and platelets. You may be at increased risk for infections and bleeding. nausea and vomiting pain, swelling, redness or irritation at the injection site pain, tingling, numbness in the hands or feet problems with balance, talking, walking trouble passing urine or change in the amount of urine Side effects that usually do not require  medical attention (report to your doctor or health care professional if they continue or are bothersome): hair loss loss of appetite metallic taste in the mouth or changes in taste This list may not describe all possible side effects. Call your doctor for medical advice about side effects. You may report side effects to FDA at 1-800-FDA-1088. Where should I keep my medication? This drug is given in a hospital or clinic and will not be stored at home. NOTE: This sheet is a summary. It may not cover all possible information. If you have questions about this medicine, talk to your doctor, pharmacist, or health care provider.  2022 Elsevier/Gold Standard (2008-05-12 00:00:00)

## 2021-11-27 ENCOUNTER — Inpatient Hospital Stay: Payer: 59

## 2021-11-27 ENCOUNTER — Other Ambulatory Visit: Payer: Self-pay

## 2021-11-27 DIAGNOSIS — C50812 Malignant neoplasm of overlapping sites of left female breast: Secondary | ICD-10-CM

## 2021-11-27 DIAGNOSIS — Z5112 Encounter for antineoplastic immunotherapy: Secondary | ICD-10-CM | POA: Diagnosis not present

## 2021-11-27 MED ORDER — PEGFILGRASTIM-CBQV 6 MG/0.6ML ~~LOC~~ SOSY
6.0000 mg | PREFILLED_SYRINGE | Freq: Once | SUBCUTANEOUS | Status: AC
  Administered 2021-11-27: 6 mg via SUBCUTANEOUS
  Filled 2021-11-27: qty 0.6

## 2021-11-30 ENCOUNTER — Other Ambulatory Visit: Payer: Self-pay

## 2021-11-30 ENCOUNTER — Encounter: Payer: Self-pay | Admitting: Surgery

## 2021-11-30 ENCOUNTER — Ambulatory Visit (INDEPENDENT_AMBULATORY_CARE_PROVIDER_SITE_OTHER): Payer: 59 | Admitting: Surgery

## 2021-11-30 ENCOUNTER — Inpatient Hospital Stay: Payer: 59

## 2021-11-30 ENCOUNTER — Ambulatory Visit: Payer: 59 | Admitting: Family Medicine

## 2021-11-30 VITALS — BP 129/65 | HR 87 | Temp 98.2°F | Ht 64.0 in | Wt 181.2 lb

## 2021-11-30 DIAGNOSIS — S21102D Unspecified open wound of left front wall of thorax without penetration into thoracic cavity, subsequent encounter: Secondary | ICD-10-CM | POA: Diagnosis not present

## 2021-11-30 DIAGNOSIS — Z5112 Encounter for antineoplastic immunotherapy: Secondary | ICD-10-CM | POA: Diagnosis not present

## 2021-11-30 DIAGNOSIS — Z9012 Acquired absence of left breast and nipple: Secondary | ICD-10-CM

## 2021-11-30 DIAGNOSIS — T8149XD Infection following a procedure, other surgical site, subsequent encounter: Secondary | ICD-10-CM | POA: Diagnosis not present

## 2021-11-30 DIAGNOSIS — C50812 Malignant neoplasm of overlapping sites of left female breast: Secondary | ICD-10-CM

## 2021-11-30 LAB — COMPREHENSIVE METABOLIC PANEL
ALT: 35 U/L (ref 0–44)
AST: 28 U/L (ref 15–41)
Albumin: 3.9 g/dL (ref 3.5–5.0)
Alkaline Phosphatase: 65 U/L (ref 38–126)
Anion gap: 10 (ref 5–15)
BUN: 14 mg/dL (ref 8–23)
CO2: 27 mmol/L (ref 22–32)
Calcium: 8.6 mg/dL — ABNORMAL LOW (ref 8.9–10.3)
Chloride: 100 mmol/L (ref 98–111)
Creatinine, Ser: 0.75 mg/dL (ref 0.44–1.00)
GFR, Estimated: >60 mL/min (ref >60)
Glucose, Bld: 172 mg/dL — ABNORMAL HIGH (ref 70–99)
Potassium: 3.8 mmol/L (ref 3.5–5.1)
Sodium: 137 mmol/L (ref 135–145)
Total Bilirubin: 0.6 mg/dL (ref 0.3–1.2)
Total Protein: 6.6 g/dL (ref 6.5–8.1)

## 2021-11-30 LAB — CBC WITH DIFFERENTIAL/PLATELET
Abs Immature Granulocytes: 0.2 10*3/uL — ABNORMAL HIGH (ref 0.00–0.07)
Band Neutrophils: 4 %
Basophils Absolute: 0 10*3/uL (ref 0.0–0.1)
Basophils Relative: 0 %
Eosinophils Absolute: 0.2 10*3/uL (ref 0.0–0.5)
Eosinophils Relative: 5 %
HCT: 29.9 % — ABNORMAL LOW (ref 36.0–46.0)
Hemoglobin: 9.5 g/dL — ABNORMAL LOW (ref 12.0–15.0)
Lymphocytes Relative: 33 %
Lymphs Abs: 1.2 10*3/uL (ref 0.7–4.0)
MCH: 26.4 pg (ref 26.0–34.0)
MCHC: 31.8 g/dL (ref 30.0–36.0)
MCV: 83.1 fL (ref 80.0–100.0)
Metamyelocytes Relative: 3 %
Monocytes Absolute: 0.1 10*3/uL (ref 0.1–1.0)
Monocytes Relative: 2 %
Myelocytes: 3 %
Neutro Abs: 1.9 10*3/uL (ref 1.7–7.7)
Neutrophils Relative %: 50 %
Platelets: 76 10*3/uL — ABNORMAL LOW (ref 150–400)
RBC: 3.6 MIL/uL — ABNORMAL LOW (ref 3.87–5.11)
RDW: 16.4 % — ABNORMAL HIGH (ref 11.5–15.5)
Smear Review: NORMAL
WBC: 3.5 10*3/uL — ABNORMAL LOW (ref 4.0–10.5)
nRBC: 0 % (ref 0.0–0.2)

## 2021-11-30 LAB — MAGNESIUM: Magnesium: 1.8 mg/dL (ref 1.7–2.4)

## 2021-11-30 NOTE — Progress Notes (Signed)
Surgical Clinic Progress/Follow-up Note   HPI:  60 y.o. Female presents to clinic for Left chest wall/mastectomy wound for wound care follow-up  Patient reports able to maintain saline dressing changes on a daily or every other day basis, undergoing chemotherapy currently.  Having some frequency of vertigo, nausea etc.  She reports having some additional lab work to follow her platelet counts etc.  To do yet this day.  She denies having any issues with Port-A-Cath function.  Review of Systems:  Constitutional: denies fever/chills  ENT: denies sore throat, hearing problems  Respiratory: denies shortness of breath, wheezing  Cardiovascular: denies chest pain, palpitations  Gastrointestinal: denies abdominal pain, N/V, or diarrhea Skin: Denies any other rashes or skin discolorations except post-surgical wounds  Vital Signs:  BP 129/65    Pulse 87    Temp 98.2 F (36.8 C) (Oral)    Ht 5\' 4"  (1.626 m)    Wt 181 lb 3.2 oz (82.2 kg)    SpO2 93%    BMI 31.10 kg/m    Physical Exam:  Constitutional:  -- Obese body habitus  -- Awake, alert, and oriented x3  Pulmonary:  --Port-A-Cath site, clean dry and intact. -- No crackles -- Equal breath sounds bilaterally -- Breathing non-labored at rest Cardiovascular:  -- S1, S2 present  -- No pericardial rubs  GU  --Levada Dy is present as chaperone, left chest wound continues to diminish in depth, there is evidence of epidermal ingrowth on the perimeter.  It is 100% granulated with a mild degree of proud flesh on the surface. Is difficult to appreciate just how much smaller it is compared to before but it is quite stable, and well-established. Musculoskeletal / Integumentary:  -- Wounds or skin discoloration: None appreciated  -- Extremities: B/L UE and LE FROM, hands and feet warm, no edema   Imaging: No new pertinent imaging available for review   Assessment:  60 y.o. yo Female with a problem list including...  Patient Active Problem List    Diagnosis Date Noted   Malignant neoplasm of upper-inner quadrant of left breast in female, estrogen receptor positive ([facility]) 10/23/2021   Carcinoma of left breast ([facility]) 08/30/2021   Status post left mastectomy 08/22/2021   Other peripheral vertigo, unspecified ear 01/02/2020   Pelvic mass 07/08/2015   Hyperlipidemia, mixed 07/08/2015   Hx of iron deficiency 07/08/2015   Absolute anemia 04/27/2015   Diabetic peripheral neuropathy associated with type 2 diabetes mellitus ([facility]) 04/27/2015   Breath shortness 04/27/2015   Accumulation of fluid in tissues 04/27/2015   Cardiac murmur 04/27/2015   HLD (hyperlipidemia) 04/27/2015   Decreased potassium in the blood 04/27/2015   Hypothyroidism 04/27/2015   Obstructive sleep apnea 04/27/2015   Disorder of peripheral nervous system 04/27/2015   Obstructive apnea 04/27/2015   Pain in shoulder 04/27/2015   Fibroid uterus 10/25/2013   Aortic valve stenosis 05/01/2011   Edema 03/27/2011   SOB (shortness of breath) 03/27/2011   HTN (hypertension) 03/27/2011   Essential (primary) hypertension 07/05/2006   Anxiety, generalized 07/05/2006    presents to clinic for follow-up evaluation of left postmastectomy wound, currently on chemotherapy, wound is stable without evidence of deterioration or infection.  Plan:              - return to clinic in 1 month for follow-up, or as needed, instructed to call office if any questions or concerns  -We will utilize Silvadene gauze to maintain wound moisture, and prevent the development of crust formation.  I  believe this will be the most cost effective means of treating her wound at present time.  All of the above recommendations were discussed with the patient and patient's family, and all of patient's and family's questions were answered to their expressed satisfaction.  These notes generated with voice recognition software. I apologize for typographical errors.  Ronny Bacon, MD, FACS [place]:  [facility]. Office: [phone]

## 2021-11-30 NOTE — Patient Instructions (Signed)
If you have any concerns or questions, please feel free to call our office.   Breast Self-Awareness Breast self-awareness is knowing how your breasts look and feel. Doing breast self-awareness is important. It allows you to catch a breast problem early while it is still small and can be treated. All women should do breast self-awareness, including women who have had breast implants. Tell your doctor if you notice a change in your breasts. What you need: A mirror. A well-lit room. How to do a breast self-exam A breast self-exam is one way to learn what is normal for your breasts and to check for changes. To do a breast self-exam: Look for changes  Take off all the clothes above your waist. Stand in front of a mirror in a room with good lighting. Put your hands on your hips. Push your hands down. Look at your breasts and nipples in the mirror to see if one breast or nipple looks different from the other. Check to see if: The shape of one breast is different. The size of one breast is different. There are wrinkles, dips, and bumps in one breast and not the other. Look at each breast for changes in the skin, such as: Redness. Scaly areas. Look for changes in your nipples, such as: Liquid around the nipples. Bleeding. Dimpling. Redness. A change in where the nipples are. Feel for changes  Lie on your back on the floor. Feel each breast. To do this, follow these steps: Pick a breast to feel. Put the arm closest to that breast above your head. Use your other arm to feel the nipple area of your breast. Feel the area with the pads of your three middle fingers by making small circles with your fingers. For the first circle, press lightly. For the second circle, press harder. For the third circle, press even harder. Keep making circles with your fingers at the different pressures as you move down your breast. Stop when you feel your ribs. Move your fingers a little toward the center of your  body. Start making circles with your fingers again, this time going up until you reach your collarbone. Keep making up-and-down circles until you reach your armpit. Remember to keep using the three pressures. Feel the other breast in the same way. Sit or stand in the tub or shower. With soapy water on your skin, feel each breast the same way you did in step 2 when you were lying on the floor. Write down what you find Writing down what you find can help you remember what to tell your doctor. Write down: What is normal for each breast. Any changes you find in each breast, including: The kind of changes you find. Whether you have pain. Size and location of any lumps. When you last had your menstrual period. General tips Check your breasts every month. If you are breastfeeding, the best time to check your breasts is after you feed your baby or after you use a breast pump. If you get menstrual periods, the best time to check your breasts is 5-7 days after your menstrual period is over. With time, you will become comfortable with the self-exam, and you will begin to know if there are changes in your breasts. Contact a doctor if you: See a change in the shape or size of your breasts or nipples. See a change in the skin of your breast or nipples, such as red or scaly skin. Have fluid coming from your nipples that is  not normal. Find a lump or thick area that was not there before. Have pain in your breasts. Have any concerns about your breast health. Summary Breast self-awareness includes looking for changes in your breasts, as well as feeling for changes within your breasts. Breast self-awareness should be done in front of a mirror in a well-lit room. You should check your breasts every month. If you get menstrual periods, the best time to check your breasts is 5-7 days after your menstrual period is over. Let your doctor know of any changes you see in your breasts, including changes in size,  changes on the skin, pain or tenderness, or fluid from your nipples that is not normal. This information is not intended to replace advice given to you by your health care provider. Make sure you discuss any questions you have with your health care provider. Document Revised: 07/22/2018 Document Reviewed: 07/22/2018 Elsevier Patient Education  [employer].

## 2021-12-04 ENCOUNTER — Other Ambulatory Visit: Payer: Self-pay | Admitting: Nurse Practitioner

## 2021-12-06 ENCOUNTER — Other Ambulatory Visit: Payer: Self-pay

## 2021-12-06 ENCOUNTER — Ambulatory Visit (INDEPENDENT_AMBULATORY_CARE_PROVIDER_SITE_OTHER): Payer: 59 | Admitting: Family Medicine

## 2021-12-06 ENCOUNTER — Encounter: Payer: Self-pay | Admitting: Family Medicine

## 2021-12-06 VITALS — BP 101/58 | HR 68 | Resp 15 | Wt 177.3 lb

## 2021-12-06 DIAGNOSIS — F411 Generalized anxiety disorder: Secondary | ICD-10-CM

## 2021-12-06 DIAGNOSIS — E119 Type 2 diabetes mellitus without complications: Secondary | ICD-10-CM

## 2021-12-06 DIAGNOSIS — I10 Essential (primary) hypertension: Secondary | ICD-10-CM

## 2021-12-06 DIAGNOSIS — E1142 Type 2 diabetes mellitus with diabetic polyneuropathy: Secondary | ICD-10-CM | POA: Diagnosis not present

## 2021-12-06 LAB — POCT GLYCOSYLATED HEMOGLOBIN (HGB A1C): Hemoglobin A1C: 6.1 % — AB (ref 4.0–5.6)

## 2021-12-06 MED ORDER — HYDROCHLOROTHIAZIDE 25 MG PO TABS: 25.0000 mg | ORAL_TABLET | Freq: Every day | ORAL | 3 refills | Status: DC

## 2021-12-06 MED ORDER — GLYBURIDE 5 MG PO TABS: ORAL_TABLET | ORAL | 3 refills | Status: DC

## 2021-12-06 NOTE — Assessment & Plan Note (Signed)
A1c stable Decreased appetite d/t cancer treatment

## 2021-12-06 NOTE — Progress Notes (Signed)
Established patient visit   Patient: Emily Tapia   DOB: Jan 22, 1961   60 y.o. Female  MRN: [id number] Visit Date: 12/06/2021  Today's healthcare provider: [name], [name]   Chief Complaint  Patient presents with   Diabetes   Hypertension   Hyperlipidemia   Hypothyroidism   Subjective    HPI  Diabetes Mellitus Type II, follow-up  Lab Results  Component Value Date   HGBA1C 6.1 (A) 12/06/2021   HGBA1C 6.5 (H) 07/14/2021   HGBA1C 12.7 (H) 10/14/2020   Last seen for diabetes 4 months ago.  Management since then includes continuing the same treatment. She reports excellent compliance with treatment. She is not having side effects.   Home blood sugar records:  not being checked  Episodes of hypoglycemia? No    Current insulin regiment: trulicity 38m weekly Most Recent Eye Exam: up to date  --------------------------------------------------------------------------------------------------- Hypertension, follow-up  BP Readings from Last 3 Encounters:  12/06/21 (!) 101/58  11/30/21 129/65  11/24/21 (!) 143/69   Wt Readings from Last 3 Encounters:  12/06/21 177 lb 4.8 oz (80.4 kg)  11/30/21 181 lb 3.2 oz (82.2 kg)  11/24/21 179 lb (81.2 kg)     She was last seen for hypertension 4 months ago.  BP at that visit was 153/75. Management since that visit includes none. She reports excellent compliance with treatment. She is not having side effects.  She is not exercising. She is adherent to low salt diet.   Outside blood pressures are checked on occasion.  She does not smoke.  Use of agents associated with hypertension: NSAIDS.   --------------------------------------------------------------------------------------------------- Lipid/Cholesterol, follow-up  Last Lipid Panel: Lab Results  Component Value Date   CHOL 183 07/14/2021   LDLCALC 120 (H) 07/14/2021   HDL 38 (L) 07/14/2021   TRIG 142 07/14/2021    She was last seen for this 4  months ago.  Management since that visit includes trying to follow low fat diabetic diet.  She reports excellent compliance with treatment. She is not having side effects.   Symptoms: No appetite changes No foot ulcerations  No chest pain No chest pressure/discomfort  No dyspnea No orthopnea  Yes fatigue Yes lower extremity edema  No palpitations No paroxysmal nocturnal dyspnea  No nausea Yes numbness or tingling of extremity  No polydipsia No polyuria  No speech difficulty No syncope   She is following a Regular diet. Current exercise: no regular exercise  Last metabolic panel Lab Results  Component Value Date   GLUCOSE 172 (H) 11/30/2021   NA 137 11/30/2021   K 3.8 11/30/2021   BUN 14 11/30/2021   CREATININE 0.75 11/30/2021   EGFR 102 07/14/2021   GFRNONAA >60 11/30/2021   CALCIUM 8.6 (L) 11/30/2021   AST 28 11/30/2021   ALT 35 11/30/2021   The 10-year ASCVD risk score (Arnett DK, et al., 2019) is: 6.9%  ---------------------------------------------------------------------------------------------------  Hypothyroid, follow-up  Lab Results  Component Value Date   TSH 2.540 07/14/2021   TSH 3.030 10/14/2020   TSH 2.790 08/14/2019   T4TOTAL 12.7 (H) 08/14/2019   T4TOTAL 10.8 10/03/2018    Wt Readings from Last 3 Encounters:  12/06/21 177 lb 4.8 oz (80.4 kg)  11/30/21 181 lb 3.2 oz (82.2 kg)  11/24/21 179 lb (81.2 kg)    She was last seen for hypothyroid 4 months ago.  Management since that visit includes none. She reports good compliance with treatment. She is not having side effects.  Symptoms: No change in energy level No constipation  Yes diarrhea No heat / cold intolerance  No nervousness No palpitations  No weight changes    -----------------------------------------------------------------------------------------   Medications: Outpatient Medications Prior to Visit  Medication Sig   Ascorbic Acid (VITAMIN C PO) Take 1 tablet by mouth daily.    aspirin 81 MG EC tablet Take 81 mg by mouth at bedtime.   carvedilol (COREG) 25 MG tablet Take 1 tablet (25 mg total) by mouth 2 (two) times daily.   Cholecalciferol (VITAMIN D3) 1000 UNITS CAPS Take 1,000 Units by mouth in the morning.   diphenoxylate-atropine (LOMOTIL) 2.5-0.025 MG tablet Take 2 tablets by mouth 4 (four) times daily as needed for diarrhea or loose stools.   Dulaglutide (TRULICITY) 3 TM/5.4YT SOPN Inject 3 mg as directed once a week.   Ferrous Sulfate (IRON) 325 (65 Fe) MG TABS Take 1 tablet by mouth once daily with breakfast   furosemide (LASIX) 40 MG tablet TAKE 1 TABLET BY MOUTH ONCE DAILY AS NEEDED FOR FLUID OR EDEMA   glucose blood (CONTOUR NEXT TEST) test strip Test fasting sugar each morning. Recheck if having hypoglycemic symptoms.   levothyroxine (EUTHYROX) 75 MCG tablet TAKE 1 TABLET BY MOUTH ONCE DAILY BEFORE BREAKFAST .   lidocaine-prilocaine (EMLA) cream Apply to affected area once   lisinopril (ZESTRIL) 40 MG tablet Take 1 tablet (40 mg total) by mouth daily.   metFORMIN (GLUCOPHAGE) 1000 MG tablet Take 1 tablet (1,000 mg total) by mouth 2 (two) times daily with a meal.   Multiple Vitamin (MULTIVITAMIN) tablet Take 1 tablet by mouth every morning.   mupirocin ointment (BACTROBAN) 2 % Apply 1 application topically 2 (two) times daily.   ondansetron (ZOFRAN) 8 MG tablet Take 1 tablet (8 mg total) by mouth 2 (two) times daily as needed for refractory nausea / vomiting.   PARoxetine (PAXIL) 20 MG tablet Take 1 tablet (20 mg total) by mouth daily.   zinc gluconate 50 MG tablet Take 50 mg by mouth daily.   [DISCONTINUED] glyBURIDE (DIABETA) 5 MG tablet TAKE 1 TABLET BY MOUTH TWICE DAILY WITH MEALS.   [DISCONTINUED] hydrochlorothiazide (HYDRODIURIL) 25 MG tablet Take 1 tablet (25 mg total) by mouth daily.   No facility-administered medications prior to visit.    Review of Systems    Objective    BP (!) 101/58    Pulse 68    Resp 15    Wt 177 lb 4.8 oz (80.4 kg)     SpO2 100%    BMI 30.43 kg/m    Physical Exam Vitals and nursing note reviewed.  Constitutional:      General: She is not in acute distress.    Appearance: Normal appearance. She is obese. She is not ill-appearing, toxic-appearing or diaphoretic.  HENT:     Head: Normocephalic and atraumatic.  Cardiovascular:     Rate and Rhythm: Normal rate and regular rhythm.     Pulses: Normal pulses.     Heart sounds: Normal heart sounds. No murmur heard.   No friction rub. No gallop.  Pulmonary:     Effort: Pulmonary effort is normal. No respiratory distress.     Breath sounds: Normal breath sounds. No stridor. No wheezing, rhonchi or rales.  Chest:     Chest wall: No tenderness.  Abdominal:     General: Bowel sounds are normal.     Palpations: Abdomen is soft.  Musculoskeletal:        General: No swelling, tenderness, deformity  or signs of injury. Normal range of motion.     Right lower leg: No edema.     Left lower leg: No edema.  Skin:    General: Skin is warm and dry.     Capillary Refill: Capillary refill takes less than 2 seconds.     Coloration: Skin is not jaundiced or pale.     Findings: No bruising, erythema, lesion or rash.  Neurological:     General: No focal deficit present.     Mental Status: She is alert and oriented to person, place, and time. Mental status is at baseline.     Cranial Nerves: No cranial nerve deficit.     Sensory: No sensory deficit.     Motor: No weakness.     Coordination: Coordination normal.  Psychiatric:        Mood and Affect: Mood normal.        Behavior: Behavior normal.        Thought Content: Thought content normal.        Judgment: Judgment normal.     Results for orders placed or performed in visit on 12/06/21  POCT glycosylated hemoglobin (Hb A1C)  Result Value Ref Range   Hemoglobin A1C 6.1 (A) 4.0 - 5.6 %   HbA1c POC (<> result, manual entry)     HbA1c, POC (prediabetic range)     HbA1c, POC (controlled diabetic range)       Assessment & Plan     Problem List Items Addressed This Visit       Cardiovascular and Mediastinum   Essential (primary) hypertension    Chronic, stable Continue to monitor Has not been needed fluid pills      Relevant Medications   hydrochlorothiazide (HYDRODIURIL) 25 MG tablet     Endocrine   Diabetic peripheral neuropathy associated with type 2 diabetes mellitus ([facility])    A1c stable Decreased appetite d/t cancer treatment      Relevant Medications   glyBURIDE (DIABETA) 5 MG tablet   Type 2 diabetes mellitus without complication, without long-term current use of insulin ([facility]) - Primary   Relevant Medications   glyBURIDE (DIABETA) 5 MG tablet   Other Relevant Orders   POCT glycosylated hemoglobin (Hb A1C) (Completed)     Other   Anxiety, generalized    Stable at this time Notes fluctuation in mood d/t GI side effects        Return in about 6 months (around 06/06/2022) for chonic disease management.      Vonna Kotyk, [name], have reviewed all documentation for this visit. The documentation on 12/06/21 for the exam, diagnosis, procedures, and orders are all accurate and complete.    [name], [place] [phone] (phone) [phone] (fax)  [place]

## 2021-12-06 NOTE — Assessment & Plan Note (Signed)
Chronic, stable Continue to monitor Has not been needed fluid pills

## 2021-12-06 NOTE — Assessment & Plan Note (Signed)
Stable at this time Notes fluctuation in mood d/t GI side effects

## 2021-12-11 NOTE — Progress Notes (Signed)
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    INTERVAL HISTORY: Patient returns to clinic today for further evaluation and consideration of cycle 3 of Taxotere, carboplatinum, Herceptin, and Perjeta.  She currently feels well and is asymptomatic.  She does not complain of loose stools or diarrhea today.  Her surgical wound continues to slowly heal and she was recently evaluated by surgery.  She has no neurologic complaints.  She denies any recent fevers or illnesses.  She has a good appetite and denies weight loss.  She has no chest pain, shortness of breath, cough, or hemoptysis.  She denies any nausea, vomiting, or constipation.  She has no urinary complaints.  Patient offers no further specific complaints today.  REVIEW OF SYSTEMS:   Review of Systems  Constitutional: Negative.  Negative for fever, malaise/fatigue and weight loss.  Respiratory: Negative.  Negative for cough and shortness of breath.   Cardiovascular: Negative.  Negative for chest pain and leg swelling.  Gastrointestinal: Negative.  Negative for abdominal pain and diarrhea.  Genitourinary: Negative.  Negative for dysuria.  Musculoskeletal: Negative.  Negative for back pain.  Skin: Negative.  Negative for rash.  Neurological: Negative.  Negative for dizziness, focal weakness, weakness and headaches.  Psychiatric/Behavioral: Negative.  The patient is not nervous/anxious.    As per HPI. Otherwise, a complete review of systems is negative.  PAST MEDICAL HISTORY: Past  Medical History:  Diagnosis Date   Anxiety    Aortic stenosis    Diabetes mellitus    Edema    Heart murmur    Hypertension    Sleep apnea    Thyroid disease     PAST SURGICAL HISTORY: Past Surgical History:  Procedure Laterality Date   ABDOMINAL HYSTERECTOMY  11/17/2013   BREAST BIOPSY     x2   BREAST BIOPSY Left 07/20/2021   [place] Bx, Ribbon Clip, Path pending   BREAST BIOPSY Left 07/20/2021   Stereo Bx, X-clip, path pending   BREAST BIOPSY Left 07/20/2021   Stereo biopsy, coil clip, path pending   BREAST BIOPSY Right 07/20/2021   Stereo Bx, Ribbon Clip, path pending   CESAREAN SECTION     x 2   CHOLECYSTECTOMY  12/17/2001   DUE TO STONES   DILATION AND CURETTAGE OF UTERUS     ELBOW SURGERY     POLYPECTOMY  12/17/1993   PORTACATH PLACEMENT Right 10/25/2021   Procedure: INSERTION PORT-A-CATH;  Surgeon: Ronny Bacon, MD;  Location: [facility] ORS;  Service: General;  Laterality: Right;   SIMPLE MASTECTOMY WITH AXILLARY SENTINEL NODE BIOPSY Left 08/16/2021   Procedure: SIMPLE MASTECTOMY WITH AXILLARY SENTINEL NODE BIOPSY;  Surgeon: Ronny Bacon, MD;  Location: [facility];  Service: General;  Laterality: Left;    FAMILY HISTORY: Family History  Problem Relation Age of Onset   Hyperlipidemia Mother    Thyroid disease Mother    Hashimoto's thyroiditis Mother    Heart disease Father        open heart surgery   Heart failure Father    Diabetes Father    Dementia Father    Thyroid disease Brother  Breast cancer Maternal Grandmother    Diabetes Paternal Grandmother    Prostate cancer Paternal Grandfather     ADVANCED DIRECTIVES (Y/N):  N  HEALTH MAINTENANCE: Social History   Tobacco Use   Smoking status: Former    Packs/day: 1.50    Years: 25.00    Pack years: 37.50    Types: Cigarettes    Quit date: 11/24/2001    Years since quitting: 20.0   Smokeless tobacco: Never  Vaping Use   Vaping Use: Never used  Substance Use Topics   Alcohol use: Yes     Alcohol/week: 0.0 standard drinks    Comment: OCCASIONALLY DRINKS WINE, ONCE A WEEK   Drug use: No     Colonoscopy:  PAP:  Bone density:  Lipid panel:  Allergies  Allergen Reactions   Fosaprepitant Nausea Only and Other (See Comments)    Back pain, nausea. See progress note from 10/27/2021     Current Outpatient Medications  Medication Sig Dispense Refill   Ascorbic Acid (VITAMIN C PO) Take 1 tablet by mouth daily.     aspirin 81 MG EC tablet Take 81 mg by mouth at bedtime.     carvedilol (COREG) 25 MG tablet Take 1 tablet (25 mg total) by mouth 2 (two) times daily. 180 tablet 1   Cholecalciferol (VITAMIN D3) 1000 UNITS CAPS Take 1,000 Units by mouth in the morning.     diphenoxylate-atropine (LOMOTIL) 2.5-0.025 MG tablet Take 2 tablets by mouth 4 (four) times daily as needed for diarrhea or loose stools. 60 tablet 1   Dulaglutide (TRULICITY) 3 WJ/1.9JY SOPN Inject 3 mg as directed once a week. 2 mL 3   Ferrous Sulfate (IRON) 325 (65 Fe) MG TABS Take 1 tablet by mouth once daily with breakfast 90 tablet 0   furosemide (LASIX) 40 MG tablet TAKE 1 TABLET BY MOUTH ONCE DAILY AS NEEDED FOR FLUID OR EDEMA 90 tablet 1   glucose blood (CONTOUR NEXT TEST) test strip Test fasting sugar each morning. Recheck if having hypoglycemic symptoms. 100 each 4   glyBURIDE (DIABETA) 5 MG tablet TAKE 1 TABLET BY MOUTH TWICE DAILY WITH MEALS. 180 tablet 3   hydrochlorothiazide (HYDRODIURIL) 25 MG tablet Take 1 tablet (25 mg total) by mouth daily. 90 tablet 3   levothyroxine (EUTHYROX) 75 MCG tablet TAKE 1 TABLET BY MOUTH ONCE DAILY BEFORE BREAKFAST . 90 tablet 1   lidocaine-prilocaine (EMLA) cream Apply to affected area once 30 g 3   lisinopril (ZESTRIL) 40 MG tablet Take 1 tablet (40 mg total) by mouth daily. 90 tablet 1   metFORMIN (GLUCOPHAGE) 1000 MG tablet Take 1 tablet (1,000 mg total) by mouth 2 (two) times daily with a meal. 180 tablet 1   Multiple Vitamin (MULTIVITAMIN) tablet Take 1 tablet by  mouth every morning.     mupirocin ointment (BACTROBAN) 2 % Apply 1 application topically 2 (two) times daily. 22 g 0   ondansetron (ZOFRAN) 8 MG tablet Take 1 tablet (8 mg total) by mouth 2 (two) times daily as needed for refractory nausea / vomiting. 60 tablet 2   PARoxetine (PAXIL) 20 MG tablet Take 1 tablet (20 mg total) by mouth daily. 90 tablet 1   zinc gluconate 50 MG tablet Take 50 mg by mouth daily.     No current facility-administered medications for this visit.    OBJECTIVE: Vitals:   12/14/21 0941  BP: 140/68  Pulse: 66  Resp: 16  Temp: 97.9 F (36.6 C)  SpO2: 99%  Body mass index is 31.33 kg/m.    ECOG FS:0 - Asymptomatic  General: Well-developed, well-nourished, no acute distress. Eyes: Pink conjunctiva, anicteric sclera. HEENT: Normocephalic, moist mucous membranes. Lungs: No audible wheezing or coughing. Heart: Regular rate and rhythm. Abdomen: Soft, nontender, no obvious distention. Musculoskeletal: No edema, cyanosis, or clubbing. Neuro: Alert, answering all questions appropriately. Cranial nerves grossly intact. Skin: No rashes or petechiae noted. Psych: Normal affect.  LAB RESULTS:  Lab Results  Component Value Date   NA 137 12/14/2021   K 3.6 12/14/2021   CL 97 (L) 12/14/2021   CO2 31 12/14/2021   GLUCOSE 228 (H) 12/14/2021   BUN 11 12/14/2021   CREATININE 0.63 12/14/2021   CALCIUM 8.6 (L) 12/14/2021   PROT 6.3 (L) 12/14/2021   ALBUMIN 3.7 12/14/2021   AST 22 12/14/2021   ALT 22 12/14/2021   ALKPHOS 78 12/14/2021   BILITOT 0.5 12/14/2021   GFRNONAA >60 12/14/2021   GFRAA 111 10/14/2020    Lab Results  Component Value Date   WBC 7.7 12/14/2021   NEUTROABS 6.0 12/14/2021   HGB 9.2 (L) 12/14/2021   HCT 28.9 (L) 12/14/2021   MCV 83.5 12/14/2021   PLT 56 (L) 12/14/2021     STUDIES: No results found.  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    PLAN:    Stage Ib triple positive multifocal  carcinoma of the left breast: She underwent simple mastectomy on August 16, 2021 which not only revealed invasive carcinoma,but she also had 2 of 3 lymph nodes positive for disease.  Previously, right breast biopsy was negative for malignancy.  Given stage and HER2 status of disease, patient will require adjuvant treatment using Taxotere, carboplatinum, Perjeta, and Herceptin every 3 weeks for 6 cycles with Udenyca support and then maintenance Herceptin and Perjeta every 3 weeks for an entire year.  She will also benefit from letrozole for 5 years at the conclusion of all of her treatments.  MUGA scan from September 21, 2021 revealed a EF of 69.3%.  Patient has had port placement.  Given her persistent thrombocytopenia and loose stool/diarrhea, carboplatinum and Taxotere have been dose reduced.  Delay cycle 3 of treatment tomorrow secondary to thrombocytopenia.  Return to clinic in 1 week for further evaluation and reconsider of treatment.  Patient may only be able to receive treatment every 4 weeks given her persistent thrombocytopenia.     Mastectomy wound: Continue follow-up with surgery as scheduled.  Diarrhea: Improved.  Continue Lomotil as needed. Leukopenia: Resolved.  Udenyca as above. 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  Delay dose reduced treatment once again.  Patient may only be able to receive treatment every 4 weeks at this point.   Fosaprepitant: Patient had an allergic reaction to this premedication and has subsequently been deleted from her treatment regimen. Anemia: Patient's hemoglobin is decreased to 9.2, monitor. Hypomagnesia: Patient was instructed to take OTC magnesium supplementation.   Patient expressed understanding and was in agreement with this plan. She also understands that She can call clinic at any time with any questions, concerns, or complaints.    Cancer Staging  Carcinoma of left breast [facility]) Staging form: Breast, AJCC 8th Edition - Pathologic stage from  08/30/2021: Stage IB (pT2, pN1a, cM0, G3, ER+, PR+, HER2+, Oncotype DX score: 18) - Signed by Lloyd Huger, MD on 09/13/2021 Stage prefix: Initial diagnosis Multigene prognostic tests performed: Oncotype DX Recurrence score range: Greater than or equal to 11 Histologic grading system: 3 grade system  Lloyd Huger, MD   12/14/2021 11:22 AM

## 2021-12-14 ENCOUNTER — Inpatient Hospital Stay (HOSPITAL_BASED_OUTPATIENT_CLINIC_OR_DEPARTMENT_OTHER): Payer: 59 | Admitting: Oncology

## 2021-12-14 ENCOUNTER — Other Ambulatory Visit: Payer: Self-pay

## 2021-12-14 ENCOUNTER — Encounter: Payer: Self-pay | Admitting: Oncology

## 2021-12-14 ENCOUNTER — Inpatient Hospital Stay: Payer: 59

## 2021-12-14 VITALS — BP 140/68 | HR 66 | Temp 97.9°F | Resp 16 | Wt 182.5 lb

## 2021-12-14 DIAGNOSIS — Z5112 Encounter for antineoplastic immunotherapy: Secondary | ICD-10-CM | POA: Diagnosis not present

## 2021-12-14 DIAGNOSIS — Z170 Estrogen receptor positive status [ER+]: Secondary | ICD-10-CM

## 2021-12-14 DIAGNOSIS — C50812 Malignant neoplasm of overlapping sites of left female breast: Secondary | ICD-10-CM

## 2021-12-14 LAB — CBC WITH DIFFERENTIAL/PLATELET
Abs Immature Granulocytes: 0.06 10*3/uL (ref 0.00–0.07)
Basophils Absolute: 0 10*3/uL (ref 0.0–0.1)
Basophils Relative: 0 %
Eosinophils Absolute: 0 10*3/uL (ref 0.0–0.5)
Eosinophils Relative: 0 %
HCT: 28.9 % — ABNORMAL LOW (ref 36.0–46.0)
Hemoglobin: 9.2 g/dL — ABNORMAL LOW (ref 12.0–15.0)
Immature Granulocytes: 1 %
Lymphocytes Relative: 18 %
Lymphs Abs: 1.4 10*3/uL (ref 0.7–4.0)
MCH: 26.6 pg (ref 26.0–34.0)
MCHC: 31.8 g/dL (ref 30.0–36.0)
MCV: 83.5 fL (ref 80.0–100.0)
Monocytes Absolute: 0.3 10*3/uL (ref 0.1–1.0)
Monocytes Relative: 4 %
Neutro Abs: 6 10*3/uL (ref 1.7–7.7)
Neutrophils Relative %: 77 %
Platelets: 56 10*3/uL — ABNORMAL LOW (ref 150–400)
RBC: 3.46 MIL/uL — ABNORMAL LOW (ref 3.87–5.11)
RDW: 18.1 % — ABNORMAL HIGH (ref 11.5–15.5)
WBC: 7.7 10*3/uL (ref 4.0–10.5)
nRBC: 0 % (ref 0.0–0.2)

## 2021-12-14 LAB — COMPREHENSIVE METABOLIC PANEL
ALT: 22 U/L (ref 0–44)
AST: 22 U/L (ref 15–41)
Albumin: 3.7 g/dL (ref 3.5–5.0)
Alkaline Phosphatase: 78 U/L (ref 38–126)
Anion gap: 9 (ref 5–15)
BUN: 11 mg/dL (ref 8–23)
CO2: 31 mmol/L (ref 22–32)
Calcium: 8.6 mg/dL — ABNORMAL LOW (ref 8.9–10.3)
Chloride: 97 mmol/L — ABNORMAL LOW (ref 98–111)
Creatinine, Ser: 0.63 mg/dL (ref 0.44–1.00)
GFR, Estimated: >60 mL/min (ref >60)
Glucose, Bld: 228 mg/dL — ABNORMAL HIGH (ref 70–99)
Potassium: 3.6 mmol/L (ref 3.5–5.1)
Sodium: 137 mmol/L (ref 135–145)
Total Bilirubin: 0.5 mg/dL (ref 0.3–1.2)
Total Protein: 6.3 g/dL — ABNORMAL LOW (ref 6.5–8.1)

## 2021-12-14 LAB — MAGNESIUM: Magnesium: 1.6 mg/dL — ABNORMAL LOW (ref 1.7–2.4)

## 2021-12-14 NOTE — Progress Notes (Signed)
Pt c/o dry nasal passages, and nose bleeds.

## 2021-12-14 NOTE — Progress Notes (Signed)
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    INTERVAL HISTORY: Patient returns to clinic today for further evaluation and reconsideration of cycle 3 of Taxotere Taxotere, carboplatinum, Herceptin, and Perjeta.  She currently feels well and is asymptomatic.  Her surgical wound continues to slowly heal and she was recently evaluated by surgery.  She has no neurologic complaints.  She denies any recent fevers or illnesses.  She has a good appetite and denies weight loss.  She has no chest pain, shortness of breath, cough, or hemoptysis.  She denies any nausea, vomiting, constipation, or diarrhea.  She has no urinary complaints.  Patient offers no specific complaints today.  REVIEW OF SYSTEMS:   Review of Systems  Constitutional: Negative.  Negative for fever, malaise/fatigue and weight loss.  Respiratory: Negative.  Negative for cough and shortness of breath.   Cardiovascular: Negative.  Negative for chest pain and leg swelling.  Gastrointestinal: Negative.  Negative for abdominal pain and diarrhea.  Genitourinary: Negative.  Negative for dysuria.  Musculoskeletal: Negative.  Negative for back pain.  Skin: Negative.  Negative for rash.  Neurological: Negative.  Negative for dizziness, focal weakness, weakness and headaches.  Psychiatric/Behavioral: Negative.  The patient is not nervous/anxious.    As per HPI. Otherwise, a complete review of systems is negative.  PAST MEDICAL HISTORY: Past Medical History:  Diagnosis Date   Anxiety     Aortic stenosis    Diabetes mellitus    Edema    Heart murmur    Hypertension    Sleep apnea    Thyroid disease     PAST SURGICAL HISTORY: Past Surgical History:  Procedure Laterality Date   ABDOMINAL HYSTERECTOMY  11/17/2013   BREAST BIOPSY     x2   BREAST BIOPSY Left 07/20/2021   [place] Bx, Ribbon Clip, Path pending   BREAST BIOPSY Left 07/20/2021   Stereo Bx, X-clip, path pending   BREAST BIOPSY Left 07/20/2021   Stereo biopsy, coil clip, path pending   BREAST BIOPSY Right 07/20/2021   Stereo Bx, Ribbon Clip, path pending   CESAREAN SECTION     x 2   CHOLECYSTECTOMY  12/17/2001   DUE TO STONES   DILATION AND CURETTAGE OF UTERUS     ELBOW SURGERY     POLYPECTOMY  12/17/1993   PORTACATH PLACEMENT Right 10/25/2021   Procedure: INSERTION PORT-A-CATH;  Surgeon: Ronny Bacon, MD;  Location: [facility];  Service: General;  Laterality: Right;   SIMPLE MASTECTOMY WITH AXILLARY SENTINEL NODE BIOPSY Left 08/16/2021   Procedure: SIMPLE MASTECTOMY WITH AXILLARY SENTINEL NODE BIOPSY;  Surgeon: Ronny Bacon, MD;  Location: [facility] OR[facility];  Service: General;  Laterality: Left;    FAMILY HISTORY: Family History  Problem Relation Age of Onset   Hyperlipidemia Mother    Thyroid disease Mother    Hashimoto's thyroiditis Mother    Heart disease Father        open heart surgery   Heart failure Father    Diabetes Father    Dementia Father    Thyroid disease Brother    Breast cancer Maternal Grandmother  Diabetes Paternal Grandmother    Prostate cancer Paternal Grandfather     ADVANCED DIRECTIVES (Y/N):  N  HEALTH MAINTENANCE: Social History   Tobacco Use   Smoking status: Former    Packs/day: 1.50    Years: 25.00    Pack years: 37.50    Types: Cigarettes    Quit date: 11/24/2001    Years since quitting: 20.0   Smokeless tobacco: Never  Vaping Use   Vaping Use: Never used  Substance Use Topics   Alcohol use: Yes    Alcohol/week: 0.0 standard drinks    Comment:  OCCASIONALLY DRINKS WINE, ONCE A WEEK   Drug use: No     Colonoscopy:  PAP:  Bone density:  Lipid panel:  Allergies  Allergen Reactions   Fosaprepitant Nausea Only and Other (See Comments)    Back pain, nausea. See progress note from 10/27/2021     Current Outpatient Medications  Medication Sig Dispense Refill   Ascorbic Acid (VITAMIN C PO) Take 1 tablet by mouth daily.     aspirin 81 MG EC tablet Take 81 mg by mouth at bedtime.     carvedilol (COREG) 25 MG tablet Take 1 tablet (25 mg total) by mouth 2 (two) times daily. 180 tablet 1   Cholecalciferol (VITAMIN D3) 1000 UNITS CAPS Take 1,000 Units by mouth in the morning.     diphenoxylate-atropine (LOMOTIL) 2.5-0.025 MG tablet Take 2 tablets by mouth 4 (four) times daily as needed for diarrhea or loose stools. 60 tablet 1   Dulaglutide (TRULICITY) 3 YY/5.0PT SOPN Inject 3 mg as directed once a week. 2 mL 3   Ferrous Sulfate (IRON) 325 (65 Fe) MG TABS Take 1 tablet by mouth once daily with breakfast 90 tablet 0   furosemide (LASIX) 40 MG tablet TAKE 1 TABLET BY MOUTH ONCE DAILY AS NEEDED FOR FLUID OR EDEMA 90 tablet 1   glucose blood (CONTOUR NEXT TEST) test strip Test fasting sugar each morning. Recheck if having hypoglycemic symptoms. 100 each 4   glyBURIDE (DIABETA) 5 MG tablet TAKE 1 TABLET BY MOUTH TWICE DAILY WITH MEALS. 180 tablet 3   hydrochlorothiazide (HYDRODIURIL) 25 MG tablet Take 1 tablet (25 mg total) by mouth daily. 90 tablet 3   levothyroxine (EUTHYROX) 75 MCG tablet TAKE 1 TABLET BY MOUTH ONCE DAILY BEFORE BREAKFAST . 90 tablet 1   lidocaine-prilocaine (EMLA) cream Apply to affected area once 30 g 3   lisinopril (ZESTRIL) 40 MG tablet Take 1 tablet (40 mg total) by mouth daily. 90 tablet 1   metFORMIN (GLUCOPHAGE) 1000 MG tablet Take 1 tablet (1,000 mg total) by mouth 2 (two) times daily with a meal. 180 tablet 1   Multiple Vitamin (MULTIVITAMIN) tablet Take 1 tablet by mouth every morning.     mupirocin ointment  (BACTROBAN) 2 % Apply 1 application topically 2 (two) times daily. 22 g 0   ondansetron (ZOFRAN) 8 MG tablet Take 1 tablet (8 mg total) by mouth 2 (two) times daily as needed for refractory nausea / vomiting. 60 tablet 2   PARoxetine (PAXIL) 20 MG tablet Take 1 tablet (20 mg total) by mouth daily. 90 tablet 1   zinc gluconate 50 MG tablet Take 50 mg by mouth daily.     No current facility-administered medications for this visit.    OBJECTIVE: Vitals:   12/21/21 1040  BP: (!) 151/76  Pulse: 71  Resp: 16  Temp: 98.8 F (37.1 C)  SpO2: 98%     Body mass  index is 31.1 kg/m.    ECOG FS:0 - Asymptomatic  General: Well-developed, well-nourished, no acute distress. Eyes: Pink conjunctiva, anicteric sclera. HEENT: Normocephalic, moist mucous membranes. Lungs: No audible wheezing or coughing. Heart: Regular rate and rhythm. Abdomen: Soft, nontender, no obvious distention. Musculoskeletal: No edema, cyanosis, or clubbing. Neuro: Alert, answering all questions appropriately. Cranial nerves grossly intact. Skin: No rashes or petechiae noted. Psych: Normal affect.   LAB RESULTS:  Lab Results  Component Value Date   NA 137 12/21/2021   K 3.8 12/21/2021   CL 98 12/21/2021   CO2 29 12/21/2021   GLUCOSE 271 (H) 12/21/2021   BUN 11 12/21/2021   CREATININE 0.65 12/21/2021   CALCIUM 8.8 (L) 12/21/2021   PROT 6.2 (L) 12/21/2021   ALBUMIN 3.6 12/21/2021   AST 20 12/21/2021   ALT 19 12/21/2021   ALKPHOS 65 12/21/2021   BILITOT 0.5 12/21/2021   GFRNONAA >60 12/21/2021   GFRAA 111 10/14/2020    Lab Results  Component Value Date   WBC 5.1 12/21/2021   NEUTROABS 3.4 12/21/2021   HGB 9.0 (L) 12/21/2021   HCT 29.2 (L) 12/21/2021   MCV 86.9 12/21/2021   PLT 76 (L) 12/21/2021     STUDIES: No results found.  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  Previously, right breast biopsy was negative for malignancy.  Given stage and HER2 status of disease, patient will require adjuvant treatment using Taxotere, carboplatinum, Perjeta, and Herceptin every 3 weeks for 6 cycles with Udenyca support and then maintenance Herceptin and Perjeta every 3 weeks for an entire year.  She will also benefit from letrozole for 5 years at the conclusion of all of her treatments.  MUGA scan from September 21, 2021 revealed a EF of 69.3%.  Patient has had port placement.  Given her persistent thrombocytopenia, carboplatinum and Taxotere have been dose reduced twice.  Proceed with cycle 3 of treatment tomorrow cautiously.  Patient may only be able to receive treatment every 4 weeks given her persistent thrombocytopenia. Return to clinic in 4 weeks for further evaluation and consideration of cycle 4. Mastectomy wound: Continue follow-up with surgery as scheduled.  Diarrhea: Patient does not complain of this today. Continue Lomotil as needed. Leukopenia: Resolved.  Udenyca as above. 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  Carboplatinum and Taxotere has been dose reduced.  Proceed cautiously with treatment above.  Patient now only receives treatment every 4 weeks. Fosaprepitant: Patient had an allergic reaction to this premedication and has subsequently been deleted from her treatment regimen. Anemia: Chronic and unchanged.  Patient's hemoglobin is 9.0.  Hypomagnesia: Patient was instructed to take OTC magnesium supplementation.   Patient expressed understanding and was in agreement with this plan. She also understands that She can call clinic at any time with any questions, concerns, or complaints.    Cancer Staging  Carcinoma of left breast [facility]) Staging form: Breast, AJCC 8th Edition - Pathologic stage from 08/30/2021: Stage IB (pT2,  pN1a, cM0, G3, ER+, PR+, HER2+, Oncotype DX score: 18) - Signed by Lloyd Huger, MD on 09/13/2021 Stage prefix: Initial diagnosis Multigene prognostic tests performed: Oncotype DX Recurrence score range: Greater than or equal to 11 Histologic grading system: 3 grade system  Christia Reading  Bridget Hartshorn, MD   12/22/2021 6:26 AM

## 2021-12-15 ENCOUNTER — Inpatient Hospital Stay: Payer: 59

## 2021-12-20 ENCOUNTER — Encounter: Payer: Self-pay | Admitting: Oncology

## 2021-12-21 ENCOUNTER — Other Ambulatory Visit: Payer: Self-pay

## 2021-12-21 ENCOUNTER — Inpatient Hospital Stay (HOSPITAL_BASED_OUTPATIENT_CLINIC_OR_DEPARTMENT_OTHER): Payer: Medicare HMO | Admitting: Oncology

## 2021-12-21 ENCOUNTER — Inpatient Hospital Stay: Payer: Medicare HMO | Attending: Oncology

## 2021-12-21 ENCOUNTER — Encounter: Payer: Self-pay | Admitting: Oncology

## 2021-12-21 VITALS — BP 151/76 | HR 71 | Temp 98.8°F | Resp 16 | Wt 181.2 lb

## 2021-12-21 DIAGNOSIS — C50812 Malignant neoplasm of overlapping sites of left female breast: Secondary | ICD-10-CM | POA: Diagnosis present

## 2021-12-21 DIAGNOSIS — D696 Thrombocytopenia, unspecified: Secondary | ICD-10-CM | POA: Insufficient documentation

## 2021-12-21 DIAGNOSIS — Z5112 Encounter for antineoplastic immunotherapy: Secondary | ICD-10-CM | POA: Diagnosis present

## 2021-12-21 DIAGNOSIS — A088 Other specified intestinal infections: Secondary | ICD-10-CM | POA: Diagnosis not present

## 2021-12-21 DIAGNOSIS — Z5111 Encounter for antineoplastic chemotherapy: Secondary | ICD-10-CM | POA: Insufficient documentation

## 2021-12-21 DIAGNOSIS — Z170 Estrogen receptor positive status [ER+]: Secondary | ICD-10-CM

## 2021-12-21 DIAGNOSIS — D649 Anemia, unspecified: Secondary | ICD-10-CM | POA: Diagnosis not present

## 2021-12-21 DIAGNOSIS — Z79899 Other long term (current) drug therapy: Secondary | ICD-10-CM | POA: Insufficient documentation

## 2021-12-21 LAB — CBC WITH DIFFERENTIAL/PLATELET
Abs Immature Granulocytes: 0.03 10*3/uL (ref 0.00–0.07)
Basophils Absolute: 0 10*3/uL (ref 0.0–0.1)
Basophils Relative: 0 %
Eosinophils Absolute: 0.1 10*3/uL (ref 0.0–0.5)
Eosinophils Relative: 2 %
HCT: 29.2 % — ABNORMAL LOW (ref 36.0–46.0)
Hemoglobin: 9 g/dL — ABNORMAL LOW (ref 12.0–15.0)
Immature Granulocytes: 1 %
Lymphocytes Relative: 26 %
Lymphs Abs: 1.3 10*3/uL (ref 0.7–4.0)
MCH: 26.8 pg (ref 26.0–34.0)
MCHC: 30.8 g/dL (ref 30.0–36.0)
MCV: 86.9 fL (ref 80.0–100.0)
Monocytes Absolute: 0.2 10*3/uL (ref 0.1–1.0)
Monocytes Relative: 5 %
Neutro Abs: 3.4 10*3/uL (ref 1.7–7.7)
Neutrophils Relative %: 66 %
Platelets: 76 10*3/uL — ABNORMAL LOW (ref 150–400)
RBC: 3.36 MIL/uL — ABNORMAL LOW (ref 3.87–5.11)
RDW: 19.9 % — ABNORMAL HIGH (ref 11.5–15.5)
WBC: 5.1 10*3/uL (ref 4.0–10.5)
nRBC: 0 % (ref 0.0–0.2)

## 2021-12-21 LAB — COMPREHENSIVE METABOLIC PANEL
ALT: 19 U/L (ref 0–44)
AST: 20 U/L (ref 15–41)
Albumin: 3.6 g/dL (ref 3.5–5.0)
Alkaline Phosphatase: 65 U/L (ref 38–126)
Anion gap: 10 (ref 5–15)
BUN: 11 mg/dL (ref 8–23)
CO2: 29 mmol/L (ref 22–32)
Calcium: 8.8 mg/dL — ABNORMAL LOW (ref 8.9–10.3)
Chloride: 98 mmol/L (ref 98–111)
Creatinine, Ser: 0.65 mg/dL (ref 0.44–1.00)
GFR, Estimated: >60 mL/min (ref >60)
Glucose, Bld: 271 mg/dL — ABNORMAL HIGH (ref 70–99)
Potassium: 3.8 mmol/L (ref 3.5–5.1)
Sodium: 137 mmol/L (ref 135–145)
Total Bilirubin: 0.5 mg/dL (ref 0.3–1.2)
Total Protein: 6.2 g/dL — ABNORMAL LOW (ref 6.5–8.1)

## 2021-12-21 NOTE — Progress Notes (Signed)
Patient here for pre treatment check she has no questions or concerns reports improved appetite and diarrhea.

## 2021-12-22 ENCOUNTER — Encounter: Payer: Self-pay | Admitting: Oncology

## 2021-12-22 ENCOUNTER — Inpatient Hospital Stay: Payer: Medicare HMO

## 2021-12-22 VITALS — BP 120/69 | HR 68 | Temp 96.3°F | Resp 16 | Wt 186.0 lb

## 2021-12-22 DIAGNOSIS — C50812 Malignant neoplasm of overlapping sites of left female breast: Secondary | ICD-10-CM

## 2021-12-22 DIAGNOSIS — Z5112 Encounter for antineoplastic immunotherapy: Secondary | ICD-10-CM | POA: Diagnosis not present

## 2021-12-22 MED ORDER — SODIUM CHLORIDE 0.9 % IV SOLN
Freq: Once | INTRAVENOUS | Status: AC
  Filled 2021-12-22: qty 250

## 2021-12-22 MED ORDER — ACETAMINOPHEN 325 MG PO TABS
650.0000 mg | ORAL_TABLET | Freq: Once | ORAL | Status: AC
  Administered 2021-12-22: 650 mg via ORAL
  Filled 2021-12-22: qty 2

## 2021-12-22 MED ORDER — PALONOSETRON HCL INJECTION 0.25 MG/5ML
0.2500 mg | Freq: Once | INTRAVENOUS | Status: AC
  Administered 2021-12-22: 0.25 mg via INTRAVENOUS
  Filled 2021-12-22: qty 5

## 2021-12-22 MED ORDER — SODIUM CHLORIDE 0.9 % IV SOLN
10.0000 mg | Freq: Once | INTRAVENOUS | Status: AC
  Administered 2021-12-22: 10 mg via INTRAVENOUS
  Filled 2021-12-22: qty 10

## 2021-12-22 MED ORDER — SODIUM CHLORIDE 0.9 % IV SOLN
492.8000 mg | Freq: Once | INTRAVENOUS | Status: AC
  Administered 2021-12-22: 490 mg via INTRAVENOUS
  Filled 2021-12-22: qty 49

## 2021-12-22 MED ORDER — SODIUM CHLORIDE 0.9 % IV SOLN
60.0000 mg/m2 | Freq: Once | INTRAVENOUS | Status: AC
  Administered 2021-12-22: 120 mg via INTRAVENOUS
  Filled 2021-12-22: qty 12

## 2021-12-22 MED ORDER — TRASTUZUMAB-DKST CHEMO 150 MG IV SOLR
6.0000 mg/kg | Freq: Once | INTRAVENOUS | Status: AC
  Administered 2021-12-22: 504 mg via INTRAVENOUS
  Filled 2021-12-22: qty 24

## 2021-12-22 MED ORDER — SODIUM CHLORIDE 0.9 % IV SOLN
420.0000 mg | Freq: Once | INTRAVENOUS | Status: AC
  Administered 2021-12-22: 420 mg via INTRAVENOUS
  Filled 2021-12-22: qty 14

## 2021-12-22 MED ORDER — HEPARIN SOD (PORK) LOCK FLUSH 100 UNIT/ML IV SOLN
500.0000 [IU] | Freq: Once | INTRAVENOUS | Status: AC | PRN
  Administered 2021-12-22: 500 [IU]
  Filled 2021-12-22: qty 5

## 2021-12-22 MED ORDER — DIPHENHYDRAMINE HCL 25 MG PO CAPS
25.0000 mg | ORAL_CAPSULE | Freq: Once | ORAL | Status: AC
  Administered 2021-12-22: 25 mg via ORAL
  Filled 2021-12-22: qty 1

## 2021-12-22 NOTE — Patient Instructions (Signed)
[facility]  Discharge Instructions: °Thank you for choosing [facility] to provide your oncology and hematology care.  °If you have a lab appointment with the Cancer Center, please go directly to the Cancer Center and check in at the registration area. ° °Wear comfortable clothing and clothing appropriate for easy access to any Portacath or PICC line.  ° °We strive to give you quality time with your provider. You may need to reschedule your appointment if you arrive late (15 or more minutes).  Arriving late affects you and other patients whose appointments are after yours.  Also, if you miss three or more appointments without notifying the office, you may be dismissed from the clinic at the provider’s discretion.    °  °For prescription refill requests, have your pharmacy contact our office and allow 72 hours for refills to be completed.   ° °Today you received the following chemotherapy and/or immunotherapy agents     °  °To help prevent nausea and vomiting after your treatment, we encourage you to take your nausea medication as directed. ° °BELOW ARE SYMPTOMS THAT SHOULD BE REPORTED IMMEDIATELY: °*FEVER GREATER THAN 100.4 F (38 °C) OR HIGHER °*CHILLS OR SWEATING °*NAUSEA AND VOMITING THAT IS NOT CONTROLLED WITH YOUR NAUSEA MEDICATION °*UNUSUAL SHORTNESS OF BREATH °*UNUSUAL BRUISING OR BLEEDING °*URINARY PROBLEMS (pain or burning when urinating, or frequent urination) °*BOWEL PROBLEMS (unusual diarrhea, constipation, pain near the anus) °TENDERNESS IN MOUTH AND THROAT WITH OR WITHOUT PRESENCE OF ULCERS (sore throat, sores in mouth, or a toothache) °UNUSUAL RASH, SWELLING OR PAIN  °UNUSUAL VAGINAL DISCHARGE OR ITCHING  ° °Items with * indicate a potential emergency and should be followed up as soon as possible or go to the Emergency Department if any problems should occur. ° °Please show the CHEMOTHERAPY ALERT CARD or IMMUNOTHERAPY ALERT CARD at check-in to the  Emergency Department and triage nurse. ° °Should you have questions after your visit or need to cancel or reschedule your appointment, please contact [facility]  [phone] and follow the prompts.  Office hours are 8:00 a.m. to 4:30 p.m. Monday - Friday. Please note that voicemails left after 4:00 p.m. may not be returned until the following business day.  We are closed weekends and major holidays. You have access to a nurse at all times for urgent questions. Please call the main number to the clinic [phone] and follow the prompts. ° °For any non-urgent questions, you may also contact your provider using MyChart. We now offer e-Visits for anyone 18 and older to request care online for non-urgent symptoms. For details visit [email or web address]. °  °Also download the MyChart app! Go to the app store, search "MyChart", open the app, select [facility], and log in with your MyChart username and password. ° °Due to Covid, a mask is required upon entering the hospital/clinic. If you do not have a mask, one will be given to you upon arrival. For doctor visits, patients may have 1 support person aged 18 or older with them. For treatment visits, patients cannot have anyone with them due to current Covid guidelines and our immunocompromised population.  °

## 2022-01-01 ENCOUNTER — Other Ambulatory Visit: Payer: Self-pay

## 2022-01-01 ENCOUNTER — Encounter: Payer: Self-pay | Admitting: Physician Assistant

## 2022-01-01 ENCOUNTER — Ambulatory Visit (INDEPENDENT_AMBULATORY_CARE_PROVIDER_SITE_OTHER): Payer: Medicare HMO | Admitting: Physician Assistant

## 2022-01-01 VITALS — BP 138/67 | HR 73 | Temp 98.3°F | Ht 64.0 in | Wt 179.4 lb

## 2022-01-01 DIAGNOSIS — Z9012 Acquired absence of left breast and nipple: Secondary | ICD-10-CM

## 2022-01-01 DIAGNOSIS — S21102D Unspecified open wound of left front wall of thorax without penetration into thoracic cavity, subsequent encounter: Secondary | ICD-10-CM | POA: Diagnosis not present

## 2022-01-01 DIAGNOSIS — T8149XD Infection following a procedure, other surgical site, subsequent encounter: Secondary | ICD-10-CM | POA: Diagnosis not present

## 2022-01-01 NOTE — Progress Notes (Signed)
[facility]ICAL [facility]IC NOTE  01/01/2022  History of Present Illness: Emily Tapia is a 61 y.o. female who is well known to our service following initial left mastectomy with sentinel lymph node biopsy in on 08/16/2021. Unfortunately this was complicated by some necrosis of her medial flaps and this needed debrided on October of 2022. She additionally had port placed on 10/25/2021. All of these were with Dr Christian Mate. She has been following monthly for wound checks. Currently utilizing silvadene and superficial dressings.   Since her last visit, she reports that the wound has been doing well. Continues to make gradual improvements. She continues to utilize wound care plan without issue. No fever, chills. No other new complaints this afternoon.   Past Medical History: Past Medical History:  Diagnosis Date   Anxiety    Aortic stenosis    Diabetes mellitus    Edema    Heart murmur    Hypertension    Sleep apnea    Thyroid disease      Past Surgical History: Past Surgical History:  Procedure Laterality Date   ABDOMINAL HYSTERECTOMY  11/17/2013   BREAST BIOPSY     x2   BREAST BIOPSY Left 07/20/2021   [place] Bx, Ribbon Clip, Path pending   BREAST BIOPSY Left 07/20/2021   Stereo Bx, X-clip, path pending   BREAST BIOPSY Left 07/20/2021   Stereo biopsy, coil clip, path pending   BREAST BIOPSY Right 07/20/2021   Stereo Bx, Ribbon Clip, path pending   CESAREAN SECTION     x 2   CHOLECYSTECTOMY  12/17/2001   DUE TO STONES   DILATION AND CURETTAGE OF UTERUS     ELBOW SURGERY     POLYPECTOMY  12/17/1993   PORTACATH PLACEMENT Right 10/25/2021   Procedure: INSERTION PORT-A-CATH;  Surgeon: Ronny Bacon, MD;  Location: [facility] ORS;  Service: General;  Laterality: Right;   SIMPLE MASTECTOMY WITH AXILLARY SENTINEL NODE BIOPSY Left 08/16/2021   Procedure: SIMPLE MASTECTOMY WITH AXILLARY SENTINEL NODE BIOPSY;  Surgeon: Ronny Bacon, MD;  Location: [facility] ORS;   Service: General;  Laterality: Left;    Home Medications: Prior to Admission medications   Medication Sig Start Date End Date Taking? Authorizing Provider  Ascorbic Acid (VITAMIN C PO) Take 1 tablet by mouth daily.    [provider]  aspirin 81 MG EC tablet Take 81 mg by mouth at bedtime.    [provider]  carvedilol (COREG) 25 MG tablet Take 1 tablet (25 mg total) by mouth 2 (two) times daily. 10/26/21 01/24/22  Tally Joe T, [name]  Cholecalciferol (VITAMIN D3) 1000 UNITS CAPS Take 1,000 Units by mouth in the morning.    [provider]  diphenoxylate-atropine (LOMOTIL) 2.5-0.025 MG tablet Take 2 tablets by mouth 4 (four) times daily as needed for diarrhea or loose stools. 11/02/21   Lloyd Huger, MD  Dulaglutide (TRULICITY) 3 DT/2.6ZT SOPN Inject 3 mg as directed once a week. 12/05/20   Chrismon, Vickki Muff, PA-C  Ferrous Sulfate (IRON) 325 (65 Fe) MG TABS Take 1 tablet by mouth once daily with breakfast 04/24/19   Chrismon, Simona Huh E, PA-C  furosemide (LASIX) 40 MG tablet TAKE 1 TABLET BY MOUTH ONCE DAILY AS NEEDED FOR FLUID OR EDEMA 10/26/21   Tally Joe T, [name]  glucose blood (CONTOUR NEXT TEST) test strip Test fasting sugar each morning. Recheck if having hypoglycemic symptoms. 10/03/18   Chrismon, Simona Huh E, PA-C  glyBURIDE (DIABETA) 5 MG tablet TAKE 1 TABLET BY MOUTH  TWICE DAILY WITH MEALS. 12/06/21   [name], [name]  hydrochlorothiazide (HYDRODIURIL) 25 MG tablet Take 1 tablet (25 mg total) by mouth daily. 12/06/21 12/01/22  [name], [name]  levothyroxine (EUTHYROX) 75 MCG tablet TAKE 1 TABLET BY MOUTH ONCE DAILY BEFORE BREAKFAST . 10/26/21   [name], [name]  lidocaine-prilocaine (EMLA) cream Apply to affected area once 09/15/21   Lloyd Huger, MD  lisinopril (ZESTRIL) 40 MG tablet Take 1 tablet (40 mg total) by mouth daily. 10/26/21 01/24/22  [name], [name]  metFORMIN (GLUCOPHAGE) 1000 MG tablet Take 1 tablet (1,000 mg total) by mouth 2  (two) times daily with a meal. 10/26/21   [name], [name]  Multiple Vitamin (MULTIVITAMIN) tablet Take 1 tablet by mouth every morning.    [provider]  mupirocin ointment (BACTROBAN) 2 % Apply 1 application topically 2 (two) times daily. 11/07/21   Borders, Kirt Boys, NP  ondansetron (ZOFRAN) 8 MG tablet Take 1 tablet (8 mg total) by mouth 2 (two) times daily as needed for refractory nausea / vomiting. 09/15/21   Lloyd Huger, MD  PARoxetine (PAXIL) 20 MG tablet Take 1 tablet (20 mg total) by mouth daily. 10/26/21   [name], [name]  zinc gluconate 50 MG tablet Take 50 mg by mouth daily.    [provider]    Allergies: Allergies  Allergen Reactions   Fosaprepitant Nausea Only and Other (See Comments)    Back pain, nausea. See progress note from 10/27/2021     Review of Systems: Review of Systems  Constitutional:  Negative for chills and fever.  Respiratory:  Negative for cough and shortness of breath.   Cardiovascular:  Negative for chest pain and palpitations.  Gastrointestinal:  Negative for abdominal pain, nausea and vomiting.  Skin:        + Left chest wound; post-op  All other systems reviewed and are negative.  Physical Exam There were no vitals taken for this visit.  Physical Exam Vitals and nursing note reviewed. Exam conducted with a chaperone present.  Constitutional:      General: She is not in acute distress.    Appearance: Normal appearance. She is not ill-appearing.  HENT:     Head: Normocephalic and atraumatic.  Eyes:     Conjunctiva/sclera: Conjunctivae normal.  Cardiovascular:     Rate and Rhythm: Normal rate.     Pulses: Normal pulses.     Heart sounds: No murmur heard. Pulmonary:     Effort: Pulmonary effort is normal. No respiratory distress.     Breath sounds: Normal breath sounds.  Chest:       Comments: CMA chaperone present on examination; There remains an area to the medial aspect of her left mastectomy  incision which is healing via secondary intention. This measures 5 x 1 x <0.5 cm. The wound bed is 100% granulation tissue. The remainder of her incision has healed appropriately.  Genitourinary:    Comments: Deferred Skin:    General: Skin is warm and dry.  Neurological:     General: No focal deficit present.     Mental Status: She is alert. Mental status is at baseline.  Psychiatric:        Mood and Affect: Mood normal.        Behavior: Behavior normal.    Labs/Imaging: No new pertinent imaging studies  Assessment and Plan: This is a 61 y.o. female improving left chest wound s/p mastectomy and SLNB complicated  by necrosis of skin and wound dehiscence.    - Continue local wound care as previously described: Silvadene and dry superficial dressing  - I will have her continue to follow up monthly to ensure continued wound progression.   Face-to-face time spent with the patient and care providers was 20 minutes, with more than 50% of the time spent counseling, educating, and coordinating care of the patient.     Edison Simon, PA-C [facility] 01/01/2022, 3:48 PM [phone] M-F: 7am - 4pm

## 2022-01-01 NOTE — Patient Instructions (Signed)
Continue with the silvadene dressing changes daily.   Follow up here in 1 month.     Please call and ask to speak with a nurse if you develop questions or concerns.

## 2022-01-03 ENCOUNTER — Encounter: Payer: Self-pay | Admitting: Physician Assistant

## 2022-01-15 ENCOUNTER — Telehealth: Payer: Self-pay | Admitting: Family Medicine

## 2022-01-15 ENCOUNTER — Other Ambulatory Visit: Payer: Self-pay

## 2022-01-15 NOTE — Telephone Encounter (Signed)
[place] faxed refill request for the following medications:  levothyroxine (EUTHYROX) 75 MCG tablet   Please advise.

## 2022-01-16 MED ORDER — LEVOTHYROXINE SODIUM 75 MCG PO TABS: ORAL_TABLET | ORAL | 1 refills | Status: DC

## 2022-01-16 NOTE — Progress Notes (Signed)
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    INTERVAL HISTORY: Patient returns to clinic today for further evaluation and consideration of cycle 4 of Taxotere, carboplatinum, Herceptin, and Perjeta.  She currently feels well and is asymptomatic.  Her surgical wound is essentially healed. She has no neurologic complaints.  She denies any recent fevers or illnesses.  She has a good appetite and denies weight loss.  She has no chest pain, shortness of breath, cough, or hemoptysis.  She denies any nausea, vomiting, constipation, or diarrhea.  She has no urinary complaints.  Patient offers no further specific complaints today.  REVIEW OF SYSTEMS:   Review of Systems  Constitutional: Negative.  Negative for fever, malaise/fatigue and weight loss.  Respiratory: Negative.  Negative for cough and shortness of breath.   Cardiovascular: Negative.  Negative for chest pain and leg swelling.  Gastrointestinal: Negative.  Negative for abdominal pain and diarrhea.  Genitourinary: Negative.  Negative for dysuria.  Musculoskeletal: Negative.  Negative for back pain.  Skin: Negative.  Negative for rash.  Neurological: Negative.  Negative for dizziness, focal weakness, weakness and headaches.  Psychiatric/Behavioral: Negative.  The patient is not nervous/anxious.    As per HPI. Otherwise, a complete review of systems is negative.  PAST MEDICAL HISTORY: Past Medical History:  Diagnosis Date   Anxiety    Aortic stenosis    Diabetes mellitus    Edema     Heart murmur    Hypertension    Sleep apnea    Thyroid disease     PAST SURGICAL HISTORY: Past Surgical History:  Procedure Laterality Date   ABDOMINAL HYSTERECTOMY  11/17/2013   BREAST BIOPSY     x2   BREAST BIOPSY Left 07/20/2021   [place] Bx, Ribbon Clip, Path pending   BREAST BIOPSY Left 07/20/2021   Stereo Bx, X-clip, path pending   BREAST BIOPSY Left 07/20/2021   Stereo biopsy, coil clip, path pending   BREAST BIOPSY Right 07/20/2021   Stereo Bx, Ribbon Clip, path pending   CESAREAN SECTION     x 2   CHOLECYSTECTOMY  12/17/2001   DUE TO STONES   DILATION AND CURETTAGE OF UTERUS     ELBOW SURGERY     POLYPECTOMY  12/17/1993   PORTACATH PLACEMENT Right 10/25/2021   Procedure: INSERTION PORT-A-CATH;  Surgeon: Ronny Bacon, MD;  Location: [facility] OR[facility];  Service: General;  Laterality: Right;   SIMPLE MASTECTOMY WITH AXILLARY SENTINEL NODE BIOPSY Left 08/16/2021   Procedure: SIMPLE MASTECTOMY WITH AXILLARY SENTINEL NODE BIOPSY;  Surgeon: Ronny Bacon, MD;  Location: [facility] OR[facility];  Service: General;  Laterality: Left;    FAMILY HISTORY: Family History  Problem Relation Age of Onset   Hyperlipidemia Mother    Thyroid disease Mother    Hashimoto's thyroiditis Mother    Heart disease Father        open heart surgery   Heart failure Father    Diabetes Father    Dementia Father    Thyroid disease Brother    Breast cancer Maternal Grandmother    Diabetes Paternal Grandmother    Prostate cancer Paternal  Grandfather     ADVANCED DIRECTIVES (Y/N):  N  HEALTH MAINTENANCE: Social History   Tobacco Use   Smoking status: Former    Packs/day: 1.50    Years: 25.00    Pack years: 37.50    Types: Cigarettes    Quit date: 11/24/2001    Years since quitting: 20.1   Smokeless tobacco: Never  Vaping Use   Vaping Use: Never used  Substance Use Topics   Alcohol use: Yes    Alcohol/week: 0.0 standard drinks    Comment: OCCASIONALLY DRINKS WINE, ONCE A WEEK   Drug use: No      Colonoscopy:  PAP:  Bone density:  Lipid panel:  Allergies  Allergen Reactions   Fosaprepitant Nausea Only and Other (See Comments)    Back pain, nausea. See progress note from 10/27/2021     Current Outpatient Medications  Medication Sig Dispense Refill   Ascorbic Acid (VITAMIN C PO) Take 1 tablet by mouth daily.     aspirin 81 MG EC tablet Take 81 mg by mouth at bedtime.     carvedilol (COREG) 25 MG tablet Take 1 tablet (25 mg total) by mouth 2 (two) times daily. 180 tablet 1   Cholecalciferol (VITAMIN D3) 1000 UNITS CAPS Take 1,000 Units by mouth in the morning.     diphenoxylate-atropine (LOMOTIL) 2.5-0.025 MG tablet Take 2 tablets by mouth 4 (four) times daily as needed for diarrhea or loose stools. 60 tablet 1   Dulaglutide (TRULICITY) 3 UJ/8.1XB SOPN Inject 3 mg as directed once a week. 2 mL 3   Ferrous Sulfate (IRON) 325 (65 Fe) MG TABS Take 1 tablet by mouth once daily with breakfast 90 tablet 0   furosemide (LASIX) 40 MG tablet TAKE 1 TABLET BY MOUTH ONCE DAILY AS NEEDED FOR FLUID OR EDEMA 90 tablet 1   glucose blood (CONTOUR NEXT TEST) test strip Test fasting sugar each morning. Recheck if having hypoglycemic symptoms. 100 each 4   glyBURIDE (DIABETA) 5 MG tablet TAKE 1 TABLET BY MOUTH TWICE DAILY WITH MEALS. 180 tablet 3   hydrochlorothiazide (HYDRODIURIL) 25 MG tablet Take 1 tablet (25 mg total) by mouth daily. 90 tablet 3   levothyroxine (EUTHYROX) 75 MCG tablet TAKE 1 TABLET BY MOUTH ONCE DAILY BEFORE BREAKFAST . 90 tablet 1   lidocaine-prilocaine (EMLA) cream Apply to affected area once 30 g 3   lisinopril (ZESTRIL) 40 MG tablet Take 1 tablet (40 mg total) by mouth daily. 90 tablet 1   metFORMIN (GLUCOPHAGE) 1000 MG tablet Take 1 tablet (1,000 mg total) by mouth 2 (two) times daily with a meal. 180 tablet 1   Multiple Vitamin (MULTIVITAMIN) tablet Take 1 tablet by mouth every morning.     mupirocin ointment (BACTROBAN) 2 % Apply 1 application topically 2 (two)  times daily. 22 g 0   ondansetron (ZOFRAN) 8 MG tablet Take 1 tablet (8 mg total) by mouth 2 (two) times daily as needed for refractory nausea / vomiting. 60 tablet 2   PARoxetine (PAXIL) 20 MG tablet Take 1 tablet (20 mg total) by mouth daily. 90 tablet 1   zinc gluconate 50 MG tablet Take 50 mg by mouth daily.     No current facility-administered medications for this visit.    OBJECTIVE: Vitals:   01/18/22 0944  BP: (!) 148/69  Pulse: 70  Temp: 98.7 F (37.1 C)     Body mass index is 32.15 kg/m.    ECOG FS:0 - Asymptomatic  General: Well-developed, well-nourished,  no acute distress. Eyes: Pink conjunctiva, anicteric sclera. HEENT: Normocephalic, moist mucous membranes. Lungs: No audible wheezing or coughing. Heart: Regular rate and rhythm. Abdomen: Soft, nontender, no obvious distention. Musculoskeletal: No edema, cyanosis, or clubbing. Neuro: Alert, answering all questions appropriately. Cranial nerves grossly intact. Skin: No rashes or petechiae noted. Psych: Normal affect.  LAB RESULTS:  Lab Results  Component Value Date   NA 135 01/18/2022   K 4.0 01/18/2022   CL 97 (L) 01/18/2022   CO2 31 01/18/2022   GLUCOSE 179 (H) 01/18/2022   BUN 15 01/18/2022   CREATININE 0.63 01/18/2022   CALCIUM 8.6 (L) 01/18/2022   PROT 7.0 01/18/2022   ALBUMIN 4.1 01/18/2022   AST 28 01/18/2022   ALT 22 01/18/2022   ALKPHOS 64 01/18/2022   BILITOT 0.2 (L) 01/18/2022   GFRNONAA >60 01/18/2022   GFRAA 111 10/14/2020    Lab Results  Component Value Date   WBC 4.3 01/18/2022   NEUTROABS 2.8 01/18/2022   HGB 9.4 (L) 01/18/2022   HCT 30.3 (L) 01/18/2022   MCV 88.9 01/18/2022   PLT 53 (L) 01/18/2022     STUDIES: No results found.  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  Previously, right breast biopsy was negative for malignancy.  Given stage and HER2 status of disease, initial plan was to give adjuvant chemotherapy using Taxotere, carboplatinum, Perjeta, and Herceptin every 3 weeks for 6 cycles with Udenyca support and then maintenance Herceptin and Perjeta every 3 weeks for an entire year.  She will also benefit from letrozole for 5 years at the conclusion of all of her treatments.  MUGA scan from September 21, 2021 revealed a EF of 69.3%.  Patient has had port placement.  Given her persistent thrombocytopenia and the fact that carboplatinum and Taxotere have been dose reduced twice, will discontinue these treatments and proceed with Herceptin and Perjeta only every 3 weeks.  Patient last received carboplatinum and Taxotere on December 22, 2021.  Return to clinic tomorrow for cycle 4 of Herceptin and Perjeta.  Return to clinic in 3 weeks for further evaluation and consideration of cycle 5.   Mastectomy wound: Resolved.  Continue follow-up with surgery as scheduled.  Diarrhea: Patient does not complain of this today. Continue Lomotil as needed. Leukopenia: Resolved.  Patient does not require further Udenyca and now the chemotherapy is being discontinued. 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  Discontinue carboplatinum and Taxotere as above.   Fosaprepitant: Patient had an allergic reaction to this premedication and has subsequently been deleted from her treatment regimen. Anemia: Chronic and unchanged.  Patient's hemoglobin is 9.4 today.   Hypomagnesia: Resolved.  Continue OTC supplementation.   Patient expressed understanding and was in agreement with this plan. She also understands that She can call clinic at any time with any questions, concerns, or complaints.    Cancer Staging  Carcinoma of left breast [facility]) Staging form: Breast, AJCC 8th Edition - Pathologic stage from 08/30/2021: Stage IB  (pT2, pN1a, cM0, G3, ER+, PR+, HER2+, Oncotype DX score: 18) - Signed by Lloyd Huger, MD on 09/13/2021 Stage prefix: Initial diagnosis Multigene prognostic tests performed: Oncotype DX Recurrence score range: Greater than or equal to 11 Histologic grading system: 3 grade  system  Lloyd Huger, MD   01/19/2022 6:47 PM

## 2022-01-18 ENCOUNTER — Inpatient Hospital Stay: Payer: 59 | Attending: Oncology

## 2022-01-18 ENCOUNTER — Other Ambulatory Visit: Payer: Self-pay

## 2022-01-18 ENCOUNTER — Encounter: Payer: Self-pay | Admitting: Oncology

## 2022-01-18 ENCOUNTER — Inpatient Hospital Stay (HOSPITAL_BASED_OUTPATIENT_CLINIC_OR_DEPARTMENT_OTHER): Payer: 59 | Admitting: Oncology

## 2022-01-18 VITALS — BP 148/69 | HR 70 | Temp 98.7°F | Wt 187.3 lb

## 2022-01-18 DIAGNOSIS — D6959 Other secondary thrombocytopenia: Secondary | ICD-10-CM | POA: Diagnosis not present

## 2022-01-18 DIAGNOSIS — D72819 Decreased white blood cell count, unspecified: Secondary | ICD-10-CM | POA: Diagnosis not present

## 2022-01-18 DIAGNOSIS — Z818 Family history of other mental and behavioral disorders: Secondary | ICD-10-CM | POA: Insufficient documentation

## 2022-01-18 DIAGNOSIS — Z833 Family history of diabetes mellitus: Secondary | ICD-10-CM | POA: Insufficient documentation

## 2022-01-18 DIAGNOSIS — C50812 Malignant neoplasm of overlapping sites of left female breast: Secondary | ICD-10-CM | POA: Insufficient documentation

## 2022-01-18 DIAGNOSIS — Z5112 Encounter for antineoplastic immunotherapy: Secondary | ICD-10-CM | POA: Diagnosis not present

## 2022-01-18 DIAGNOSIS — R197 Diarrhea, unspecified: Secondary | ICD-10-CM | POA: Insufficient documentation

## 2022-01-18 DIAGNOSIS — Z8042 Family history of malignant neoplasm of prostate: Secondary | ICD-10-CM | POA: Diagnosis not present

## 2022-01-18 DIAGNOSIS — F419 Anxiety disorder, unspecified: Secondary | ICD-10-CM | POA: Insufficient documentation

## 2022-01-18 DIAGNOSIS — Z87891 Personal history of nicotine dependence: Secondary | ICD-10-CM | POA: Diagnosis not present

## 2022-01-18 DIAGNOSIS — E119 Type 2 diabetes mellitus without complications: Secondary | ICD-10-CM | POA: Diagnosis not present

## 2022-01-18 DIAGNOSIS — T451X5A Adverse effect of antineoplastic and immunosuppressive drugs, initial encounter: Secondary | ICD-10-CM | POA: Insufficient documentation

## 2022-01-18 DIAGNOSIS — Z170 Estrogen receptor positive status [ER+]: Secondary | ICD-10-CM | POA: Insufficient documentation

## 2022-01-18 DIAGNOSIS — D649 Anemia, unspecified: Secondary | ICD-10-CM | POA: Diagnosis not present

## 2022-01-18 DIAGNOSIS — Z8349 Family history of other endocrine, nutritional and metabolic diseases: Secondary | ICD-10-CM | POA: Diagnosis not present

## 2022-01-18 DIAGNOSIS — Z803 Family history of malignant neoplasm of breast: Secondary | ICD-10-CM | POA: Insufficient documentation

## 2022-01-18 DIAGNOSIS — I350 Nonrheumatic aortic (valve) stenosis: Secondary | ICD-10-CM | POA: Diagnosis not present

## 2022-01-18 DIAGNOSIS — D696 Thrombocytopenia, unspecified: Secondary | ICD-10-CM | POA: Diagnosis not present

## 2022-01-18 DIAGNOSIS — Z8249 Family history of ischemic heart disease and other diseases of the circulatory system: Secondary | ICD-10-CM | POA: Insufficient documentation

## 2022-01-18 DIAGNOSIS — I10 Essential (primary) hypertension: Secondary | ICD-10-CM | POA: Insufficient documentation

## 2022-01-18 DIAGNOSIS — Z9049 Acquired absence of other specified parts of digestive tract: Secondary | ICD-10-CM | POA: Diagnosis not present

## 2022-01-18 DIAGNOSIS — Z79899 Other long term (current) drug therapy: Secondary | ICD-10-CM | POA: Diagnosis not present

## 2022-01-18 LAB — CBC WITH DIFFERENTIAL/PLATELET
Abs Immature Granulocytes: 0.01 10*3/uL (ref 0.00–0.07)
Basophils Absolute: 0 10*3/uL (ref 0.0–0.1)
Basophils Relative: 1 %
Eosinophils Absolute: 0.1 10*3/uL (ref 0.0–0.5)
Eosinophils Relative: 1 %
HCT: 30.3 % — ABNORMAL LOW (ref 36.0–46.0)
Hemoglobin: 9.4 g/dL — ABNORMAL LOW (ref 12.0–15.0)
Immature Granulocytes: 0 %
Lymphocytes Relative: 28 %
Lymphs Abs: 1.2 10*3/uL (ref 0.7–4.0)
MCH: 27.6 pg (ref 26.0–34.0)
MCHC: 31 g/dL (ref 30.0–36.0)
MCV: 88.9 fL (ref 80.0–100.0)
Monocytes Absolute: 0.2 10*3/uL (ref 0.1–1.0)
Monocytes Relative: 4 %
Neutro Abs: 2.8 10*3/uL (ref 1.7–7.7)
Neutrophils Relative %: 66 %
Platelets: 53 10*3/uL — ABNORMAL LOW (ref 150–400)
RBC: 3.41 MIL/uL — ABNORMAL LOW (ref 3.87–5.11)
RDW: 18.1 % — ABNORMAL HIGH (ref 11.5–15.5)
WBC: 4.3 10*3/uL (ref 4.0–10.5)
nRBC: 0 % (ref 0.0–0.2)

## 2022-01-18 LAB — COMPREHENSIVE METABOLIC PANEL
ALT: 22 U/L (ref 0–44)
AST: 28 U/L (ref 15–41)
Albumin: 4.1 g/dL (ref 3.5–5.0)
Alkaline Phosphatase: 64 U/L (ref 38–126)
Anion gap: 7 (ref 5–15)
BUN: 15 mg/dL (ref 8–23)
CO2: 31 mmol/L (ref 22–32)
Calcium: 8.6 mg/dL — ABNORMAL LOW (ref 8.9–10.3)
Chloride: 97 mmol/L — ABNORMAL LOW (ref 98–111)
Creatinine, Ser: 0.63 mg/dL (ref 0.44–1.00)
GFR, Estimated: >60 mL/min (ref >60)
Glucose, Bld: 179 mg/dL — ABNORMAL HIGH (ref 70–99)
Potassium: 4 mmol/L (ref 3.5–5.1)
Sodium: 135 mmol/L (ref 135–145)
Total Bilirubin: 0.2 mg/dL — ABNORMAL LOW (ref 0.3–1.2)
Total Protein: 7 g/dL (ref 6.5–8.1)

## 2022-01-18 LAB — MAGNESIUM: Magnesium: 1.8 mg/dL (ref 1.7–2.4)

## 2022-01-19 ENCOUNTER — Encounter: Payer: Self-pay | Admitting: Oncology

## 2022-01-19 ENCOUNTER — Inpatient Hospital Stay: Payer: 59

## 2022-01-19 VITALS — BP 119/56 | HR 70 | Temp 98.1°F | Resp 18

## 2022-01-19 DIAGNOSIS — D649 Anemia, unspecified: Secondary | ICD-10-CM | POA: Diagnosis not present

## 2022-01-19 DIAGNOSIS — C50812 Malignant neoplasm of overlapping sites of left female breast: Secondary | ICD-10-CM

## 2022-01-19 DIAGNOSIS — Z170 Estrogen receptor positive status [ER+]: Secondary | ICD-10-CM | POA: Diagnosis not present

## 2022-01-19 DIAGNOSIS — T451X5A Adverse effect of antineoplastic and immunosuppressive drugs, initial encounter: Secondary | ICD-10-CM | POA: Diagnosis not present

## 2022-01-19 DIAGNOSIS — I350 Nonrheumatic aortic (valve) stenosis: Secondary | ICD-10-CM | POA: Diagnosis not present

## 2022-01-19 DIAGNOSIS — Z5112 Encounter for antineoplastic immunotherapy: Secondary | ICD-10-CM | POA: Diagnosis not present

## 2022-01-19 DIAGNOSIS — I10 Essential (primary) hypertension: Secondary | ICD-10-CM | POA: Diagnosis not present

## 2022-01-19 DIAGNOSIS — D6959 Other secondary thrombocytopenia: Secondary | ICD-10-CM | POA: Diagnosis not present

## 2022-01-19 DIAGNOSIS — Z95828 Presence of other vascular implants and grafts: Secondary | ICD-10-CM

## 2022-01-19 DIAGNOSIS — R197 Diarrhea, unspecified: Secondary | ICD-10-CM | POA: Diagnosis not present

## 2022-01-19 DIAGNOSIS — D696 Thrombocytopenia, unspecified: Secondary | ICD-10-CM | POA: Diagnosis not present

## 2022-01-19 MED ORDER — TRASTUZUMAB-DKST CHEMO 150 MG IV SOLR
6.0000 mg/kg | Freq: Once | INTRAVENOUS | Status: AC
  Administered 2022-01-19: 504 mg via INTRAVENOUS
  Filled 2022-01-19: qty 20

## 2022-01-19 MED ORDER — HEPARIN SOD (PORK) LOCK FLUSH 100 UNIT/ML IV SOLN
500.0000 [IU] | Freq: Once | INTRAVENOUS | Status: AC
  Administered 2022-01-19: 500 [IU] via INTRAVENOUS
  Filled 2022-01-19: qty 5

## 2022-01-19 MED ORDER — ACETAMINOPHEN 325 MG PO TABS
650.0000 mg | ORAL_TABLET | Freq: Once | ORAL | Status: AC
  Administered 2022-01-19: 650 mg via ORAL
  Filled 2022-01-19: qty 2

## 2022-01-19 MED ORDER — SODIUM CHLORIDE 0.9 % IV SOLN
Freq: Once | INTRAVENOUS | Status: AC
  Filled 2022-01-19: qty 250

## 2022-01-19 MED ORDER — SODIUM CHLORIDE 0.9 % IV SOLN
420.0000 mg | Freq: Once | INTRAVENOUS | Status: AC
  Administered 2022-01-19: 420 mg via INTRAVENOUS
  Filled 2022-01-19: qty 14

## 2022-01-19 MED ORDER — DIPHENHYDRAMINE HCL 25 MG PO CAPS
25.0000 mg | ORAL_CAPSULE | Freq: Once | ORAL | Status: AC
  Administered 2022-01-19: 25 mg via ORAL
  Filled 2022-01-19: qty 1

## 2022-01-19 NOTE — Patient Instructions (Signed)
[facility] [place]  Discharge Instructions: Thank you for choosing [facility] to provide your oncology and hematology care.  If you have a lab appointment with the [facility], please go directly to the [facility] and check in at the registration area.  Wear comfortable clothing and clothing appropriate for easy access to any Portacath or PICC line.   We strive to give you quality time with your provider. You may need to reschedule your appointment if you arrive late (15 or more minutes).  Arriving late affects you and other patients whose appointments are after yours.  Also, if you miss three or more appointments without notifying the office, you may be dismissed from the clinic at the providers discretion.      For prescription refill requests, have your pharmacy contact our office and allow 72 hours for refills to be completed.    Today you received the following chemotherapy and/or immunotherapy agents HERCEPTIN and PERJETA      To help prevent nausea and vomiting after your treatment, we encourage you to take your nausea medication as directed.  BELOW ARE SYMPTOMS THAT SHOULD BE REPORTED IMMEDIATELY: *FEVER GREATER THAN 100.4 F (38 C) OR HIGHER *CHILLS OR SWEATING *NAUSEA AND VOMITING THAT IS NOT CONTROLLED WITH YOUR NAUSEA MEDICATION *UNUSUAL SHORTNESS OF BREATH *UNUSUAL BRUISING OR BLEEDING *URINARY PROBLEMS (pain or burning when urinating, or frequent urination) *BOWEL PROBLEMS (unusual diarrhea, constipation, pain near the anus) TENDERNESS IN MOUTH AND THROAT WITH OR WITHOUT PRESENCE OF ULCERS (sore throat, sores in mouth, or a toothache) UNUSUAL RASH, SWELLING OR PAIN  UNUSUAL VAGINAL DISCHARGE OR ITCHING   Items with * indicate a potential emergency and should be followed up as soon as possible or go to the Emergency Department if any problems should occur.  Please show the CHEMOTHERAPY ALERT CARD or IMMUNOTHERAPY ALERT CARD at  check-in to the Emergency Department and triage nurse.  Should you have questions after your visit or need to cancel or reschedule your appointment, please contact [facility] [place] [facility] [place]  [phone] and follow the prompts.  Office hours are 8:00 a.m. to 4:30 p.m. Monday - Friday. Please note that voicemails left after 4:00 p.m. may not be returned until the following business day.  We are closed weekends and major holidays. You have access to a nurse at all times for urgent questions. Please call the main number to the clinic [phone] and follow the prompts.  For any non-urgent questions, you may also contact your provider using MyChart. We now offer e-Visits for anyone 79 and older to request care online for non-urgent symptoms. For details visit [email or web address].   Also download the MyChart app! Go to the app store, search "MyChart", open the app, select [facility], and log in with your MyChart username and password.  Due to Covid, a mask is required upon entering the hospital/clinic. If you do not have a mask, one will be given to you upon arrival. For doctor visits, patients may have 1 support person aged 42 or older with them. For treatment visits, patients cannot have anyone with them due to current Covid guidelines and our immunocompromised population.   Trastuzumab injection for infusion What is this medication? TRASTUZUMAB (tras TOO zoo mab) is a monoclonal antibody. It is used to treat breast cancer and stomach cancer. This medicine may be used for other purposes; ask your health care provider or pharmacist if you have questions. COMMON BRAND NAME(S): Herceptin, Belenda Cruise, Ogivri,  Chalmers Guest What should I tell my care team before I take this medication? They need to know if you have any of these conditions: heart disease heart failure lung or breathing disease, like asthma an unusual or allergic reaction to trastuzumab, benzyl  alcohol, or other medications, foods, dyes, or preservatives pregnant or trying to get pregnant breast-feeding How should I use this medication? This drug is given as an infusion into a vein. It is administered in a hospital or clinic by a specially trained health care professional. Talk to your pediatrician regarding the use of this medicine in children. This medicine is not approved for use in children. Overdosage: If you think you have taken too much of this medicine contact a poison control center or emergency room at once. NOTE: This medicine is only for you. Do not share this medicine with others. What if I miss a dose? It is important not to miss a dose. Call your doctor or health care professional if you are unable to keep an appointment. What may interact with this medication? This medicine may interact with the following medications: certain types of chemotherapy, such as daunorubicin, doxorubicin, epirubicin, and idarubicin This list may not describe all possible interactions. Give your health care provider a list of all the medicines, herbs, non-prescription drugs, or dietary supplements you use. Also tell them if you smoke, drink alcohol, or use illegal drugs. Some items may interact with your medicine. What should I watch for while using this medication? Visit your doctor for checks on your progress. Report any side effects. Continue your course of treatment even though you feel ill unless your doctor tells you to stop. Call your doctor or health care professional for advice if you get a fever, chills or sore throat, or other symptoms of a cold or flu. Do not treat yourself. Try to avoid being around people who are sick. You may experience fever, chills and shaking during your first infusion. These effects are usually mild and can be treated with other medicines. Report any side effects during the infusion to your health care professional. Fever and chills usually do not happen with  later infusions. Do not become pregnant while taking this medicine or for 7 months after stopping it. Women should inform their doctor if they wish to become pregnant or think they might be pregnant. Women of child-bearing potential will need to have a negative pregnancy test before starting this medicine. There is a potential for serious side effects to an unborn child. Talk to your health care professional or pharmacist for more information. Do not breast-feed an infant while taking this medicine or for 7 months after stopping it. Women must use effective birth control with this medicine. What side effects may I notice from receiving this medication? Side effects that you should report to your doctor or health care professional as soon as possible: allergic reactions like skin rash, itching or hives, swelling of the face, lips, or tongue chest pain or palpitations cough dizziness feeling faint or lightheaded, falls fever general ill feeling or flu-like symptoms signs of worsening heart failure like breathing problems; swelling in your legs and feet unusually weak or tired Side effects that usually do not require medical attention (report to your doctor or health care professional if they continue or are bothersome): bone pain changes in taste diarrhea joint pain nausea/vomiting weight loss This list may not describe all possible side effects. Call your doctor for medical advice about side effects. You may report side  effects to FDA at 1-800-FDA-1088. Where should I keep my medication? This drug is given in a hospital or clinic and will not be stored at home. NOTE: This sheet is a summary. It may not cover all possible information. If you have questions about this medicine, talk to your doctor, pharmacist, or health care provider.  2022 Elsevier/Gold Standard (2016-12-18 00:00:00) Pertuzumab injection What is this medication? PERTUZUMAB (per TOOZ ue mab) is a monoclonal antibody. It is  used to treat breast cancer. This medicine may be used for other purposes; ask your health care provider or pharmacist if you have questions. COMMON BRAND NAME(S): PERJETA What should I tell my care team before I take this medication? They need to know if you have any of these conditions: heart disease heart failure high blood pressure history of irregular heart beat recent or ongoing radiation therapy an unusual or allergic reaction to pertuzumab, other medicines, foods, dyes, or preservatives pregnant or trying to get pregnant breast-feeding How should I use this medication? This medicine is for infusion into a vein. It is given by a health care professional in a hospital or clinic setting. Talk to your pediatrician regarding the use of this medicine in children. Special care may be needed. Overdosage: If you think you have taken too much of this medicine contact a poison control center or emergency room at once. NOTE: This medicine is only for you. Do not share this medicine with others. What if I miss a dose? It is important not to miss your dose. Call your doctor or health care professional if you are unable to keep an appointment. What may interact with this medication? Interactions are not expected. Give your health care provider a list of all the medicines, herbs, non-prescription drugs, or dietary supplements you use. Also tell them if you smoke, drink alcohol, or use illegal drugs. Some items may interact with your medicine. This list may not describe all possible interactions. Give your health care provider a list of all the medicines, herbs, non-prescription drugs, or dietary supplements you use. Also tell them if you smoke, drink alcohol, or use illegal drugs. Some items may interact with your medicine. What should I watch for while using this medication? Your condition will be monitored carefully while you are receiving this medicine. Report any side effects. Continue your  course of treatment even though you feel ill unless your doctor tells you to stop. Do not become pregnant while taking this medicine or for 7 months after stopping it. Women should inform their doctor if they wish to become pregnant or think they might be pregnant. Women of child-bearing potential will need to have a negative pregnancy test before starting this medicine. There is a potential for serious side effects to an unborn child. Talk to your health care professional or pharmacist for more information. Do not breast-feed an infant while taking this medicine or for 7 months after stopping it. Women must use effective birth control with this medicine. Call your doctor or health care professional for advice if you get a fever, chills or sore throat, or other symptoms of a cold or flu. Do not treat yourself. Try to avoid being around people who are sick. You may experience fever, chills, and headache during the infusion. Report any side effects during the infusion to your health care professional. What side effects may I notice from receiving this medication? Side effects that you should report to your doctor or health care professional as soon as possible: breathing  problems chest pain or palpitations dizziness feeling faint or lightheaded fever or chills skin rash, itching or hives sore throat swelling of the face, lips, or tongue swelling of the legs or ankles unusually weak or tired Side effects that usually do not require medical attention (report to your doctor or health care professional if they continue or are bothersome): diarrhea hair loss nausea, vomiting tiredness This list may not describe all possible side effects. Call your doctor for medical advice about side effects. You may report side effects to FDA at 1-800-FDA-1088. Where should I keep my medication? This drug is given in a hospital or clinic and will not be stored at home. NOTE: This sheet is a summary. It may not  cover all possible information. If you have questions about this medicine, talk to your doctor, pharmacist, or health care provider.  2022 Elsevier/Gold Standard (2016-01-05 00:00:00)

## 2022-01-26 ENCOUNTER — Encounter: Payer: Self-pay | Admitting: Oncology

## 2022-01-30 ENCOUNTER — Ambulatory Visit (INDEPENDENT_AMBULATORY_CARE_PROVIDER_SITE_OTHER): Payer: Medicare HMO | Admitting: Surgery

## 2022-01-30 ENCOUNTER — Other Ambulatory Visit: Payer: Self-pay

## 2022-01-30 ENCOUNTER — Encounter: Payer: Self-pay | Admitting: Surgery

## 2022-01-30 VITALS — BP 149/76 | HR 72 | Temp 98.2°F | Ht 64.0 in | Wt 183.4 lb

## 2022-01-30 DIAGNOSIS — S21102D Unspecified open wound of left front wall of thorax without penetration into thoracic cavity, subsequent encounter: Secondary | ICD-10-CM | POA: Diagnosis not present

## 2022-01-30 DIAGNOSIS — L928 Other granulomatous disorders of the skin and subcutaneous tissue: Secondary | ICD-10-CM

## 2022-01-30 DIAGNOSIS — L929 Granulomatous disorder of the skin and subcutaneous tissue, unspecified: Secondary | ICD-10-CM

## 2022-01-30 NOTE — Patient Instructions (Signed)
If you have any concerns or questions, please feel free to call our office.   Wound Care, Adult Taking care of your wound properly can help to prevent pain, infection, and scarring. It can also help your wound heal more quickly. Follow instructions from your health care provider about how to care for your wound. Supplies needed: Soap and water. Wound cleanser, saline, or germ-free (sterile) water. Gauze. If needed, a clean bandage (dressing) or other type of wound dressing material to cover or place in the wound. Follow your health care provider's instructions about what dressing supplies to use. Cream or topical ointment to apply to the wound, if told by your health care provider. How to care for your wound Cleaning the wound Ask your health care provider how to clean the wound. This may include: Using mild soap and water, a wound cleanser, saline, or sterile water. Using a clean gauze to pat the wound dry after cleaning it. Do not rub or scrub the wound. Dressing care Wash your hands with soap and water for at least 20 seconds before and after you change the dressing. If soap and water are not available, use hand sanitizer. Change your dressing as told by your health care provider. This may include: Cleaning or rinsing out (irrigating) the wound. Application of cream or topical ointment, if told by your health care provider. Placing a dressing over the wound or in the wound (packing). Covering the wound with an outer dressing. Leave stitches (sutures), staples, skin glue, or adhesive strips in place. These skin closures may need to stay in place for 2 weeks or longer. If adhesive strip edges start to loosen and curl up, you may trim the loose edges. Do not remove adhesive strips completely unless your health care provider tells you to do that. Ask your health care provider when you can leave the wound uncovered. Checking for infection Check your wound area every day for signs of infection.  Check for: More redness, swelling, or pain. Fluid or blood. Warmth. Pus or a bad smell.  Follow these instructions at home Medicines If you were prescribed an antibiotic medicine, cream, or ointment, take or apply it as told by your health care provider. Do not stop using the antibiotic even if your condition improves. If you were prescribed pain medicine, take it 30 minutes before you do any wound care or as told by your health care provider. Take over-the-counter and prescription medicines only as told by your health care provider. Eating and drinking Eat a diet that includes protein, vitamin A, vitamin C, and other nutrient-rich foods to help the wound heal. Foods rich in protein include meat, fish, eggs, dairy, beans, and nuts. Foods rich in vitamin A include carrots and dark green, leafy vegetables. Foods rich in vitamin C include citrus fruits, tomatoes, broccoli, and peppers. Drink enough fluid to keep your urine pale yellow. General instructions Do not take baths, swim, or use a hot tub until your health care provider approves. Ask your health care provider if you may take showers. You may only be allowed to take sponge baths. Do not scratch or pick at the wound. Keep it covered as told by your health care provider. Return to your normal activities as told by your health care provider. Ask your health care provider what activities are safe for you. Protect your wound from the sun when you are outside for the first 6 months, or for as long as told by your health care provider. Cover up  the scar area or apply sunscreen that has an SPF of at least 33. Do not use any products that contain nicotine or tobacco. These products include cigarettes, chewing tobacco, and vaping devices, such as e-cigarettes. If you need help quitting, ask your health care provider. Keep all follow-up visits. This is important. Contact a health care provider if: You received a tetanus shot and you have swelling,  severe pain, redness, or bleeding at the injection site. Your pain is not controlled with medicine. You have any of these signs of infection: More redness, swelling, or pain around the wound. Fluid or blood coming from the wound. Warmth coming from the wound. A fever or chills. You are nauseous or you vomit. You are dizzy. You have a new rash or hardness around the wound. Get help right away if: You have a red streak of skin near the area around your wound. Pus or a bad smell coming from the wound. Your wound has been closed with staples, sutures, skin glue, or adhesive strips and it begins to open up and separate. Your wound is bleeding, and the bleeding does not stop with gentle pressure. These symptoms may represent a serious problem that is an emergency. Do not wait to see if the symptoms will go away. Get medical help right away. Call your local emergency services (911 in the [place] Do not drive yourself to the hospital. Summary Always wash your hands with soap and water for at least 20 seconds before and after changing your dressing. Change your dressing as told by your health care provider. To help with healing, eat foods that are rich in protein, vitamin A, vitamin C, and other nutrients. Check your wound every day for signs of infection. Contact your health care provider if you think that your wound is infected. This information is not intended to replace advice given to you by your health care provider. Make sure you discuss any questions you have with your health care provider. Document Revised: 04/11/2021 Document Reviewed: 04/11/2021 Elsevier Patient Education  [place].

## 2022-01-31 ENCOUNTER — Encounter: Payer: Self-pay | Admitting: Oncology

## 2022-01-31 DIAGNOSIS — L929 Granulomatous disorder of the skin and subcutaneous tissue, unspecified: Secondary | ICD-10-CM | POA: Insufficient documentation

## 2022-01-31 NOTE — Progress Notes (Signed)
Surgical Clinic Progress/Follow-up Note   HPI:  61 y.o. Female presents to clinic for left postmastectomy wound care follow-up, she was last seen in October last year. Patient reports continued improvement in diminishing size of the wound and healing of her left chest wall.   She denies N/V, fever/chills, CP, or SOB.  Review of Systems:  Constitutional: denies fever/chills  ENT: denies sore throat, hearing problems  Respiratory: denies shortness of breath, wheezing  Cardiovascular: denies chest pain, palpitations  Gastrointestinal: denies abdominal pain, N/V, or diarrhea/and bowel function as per interval history Skin: Denies any other rashes or skin discolorations except post-surgical wounds as per interval history  Vital Signs:  BP (!) 149/76    Pulse 72    Temp 98.2 F (36.8 C) (Oral)    Ht 5\' 4"  (1.626 m)    Wt 183 lb 6.4 oz (83.2 kg)    SpO2 97%    BMI 31.48 kg/m    Physical Exam:  Constitutional:  -- Normal body habitus  -- Awake, alert, and oriented x3  Pulmonary:  -- No crackles -- Equal breath sounds bilaterally -- Breathing non-labored at rest Cardiovascular:  -- S1, S2 present  -- No pericardial rubs  Gastrointestinal:  -- Soft and non-distended, non-tender. GU  --other remaining left postmastectomy wound on the medial most aspect is still a well granulated wound that measures 3 cm horizontally by 7 mm cephalad to caudad.  There is no depth as the granulation tissue appears to be hyper granulated and consistent with proud flesh. Excisional debridement of skin and subcutaneous tissues left postmastectomy wound, left chest wall. Wound care was addressed by utilization of a bone curette to excise the hypergranulation tissue, down to viable healthy subcutaneous scar tissue.  Chemical cautery and hemostasis obtained with silver nitrate application. Musculoskeletal / Integumentary:  -- Wounds or skin discoloration: None appreciated except  as described above  -- Extremities:  B/L UE and LE FROM, hands and feet warm, no edema    Imaging: No new pertinent imaging available for review   Assessment:  61 y.o. yo Female with a problem list including...  Patient Active Problem List   Diagnosis Date Noted   Type 2 diabetes mellitus without complication, without long-term current use of insulin ([facility]) 12/06/2021   Malignant neoplasm of upper-inner quadrant of left breast in female, estrogen receptor positive ([facility]) 10/23/2021   Carcinoma of left breast ([facility]) 08/30/2021   Status post left mastectomy 08/22/2021   Other peripheral vertigo, unspecified ear 01/02/2020   Pelvic mass 07/08/2015   Hyperlipidemia, mixed 07/08/2015   Hx of iron deficiency 07/08/2015   Absolute anemia 04/27/2015   Diabetic peripheral neuropathy associated with type 2 diabetes mellitus ([facility]) 04/27/2015   Breath shortness 04/27/2015   Accumulation of fluid in tissues 04/27/2015   Cardiac murmur 04/27/2015   HLD (hyperlipidemia) 04/27/2015   Decreased potassium in the blood 04/27/2015   Hypothyroidism 04/27/2015   Obstructive sleep apnea 04/27/2015   Disorder of peripheral nervous system 04/27/2015   Obstructive apnea 04/27/2015   Pain in shoulder 04/27/2015   Fibroid uterus 10/25/2013   Aortic valve stenosis 05/01/2011   Edema 03/27/2011   SOB (shortness of breath) 03/27/2011   HTN (hypertension) 03/27/2011   Essential (primary) hypertension 07/05/2006   Anxiety, generalized 07/05/2006    presents to clinic for follow-up evaluation of left postmastectomy wound, progress likely halted by hypergranulation tissue, otherwise progressing well.  Plan:              -  return to clinic every 1 to 2 weeks for attention to hypergranulation tissue wound care.   Following debridement today with application of silver nitrate, we applied a silver calcium alginate Telfa type dressing.  It was recommended that she cut out a new square each application, likely after bathing. As needed, instructed to  call office if any questions or concerns  All of the above recommendations were discussed with the patient, and all of patient's questions were answered to her expressed satisfaction.  These notes generated with voice recognition software. I apologize for typographical errors.  Ronny Bacon, MD, FACS [place]: [facility] exceptional care. Office: [phone]

## 2022-02-02 ENCOUNTER — Other Ambulatory Visit: Payer: Self-pay | Admitting: Emergency Medicine

## 2022-02-02 DIAGNOSIS — Z170 Estrogen receptor positive status [ER+]: Secondary | ICD-10-CM

## 2022-02-05 NOTE — Progress Notes (Signed)
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    INTERVAL HISTORY: Patient returns to clinic today for further evaluation and consideration of cycle 5 of Herceptin and Perjeta only.  She currently feels well and is asymptomatic. Her surgical wound is essentially healed. She has no neurologic complaints.  She denies any recent fevers or illnesses.  She has a good appetite and denies weight loss.  She has no chest pain, shortness of breath, cough, or hemoptysis.  She denies any nausea, vomiting, constipation, or diarrhea.  She has no urinary complaints.  Patient offers no specific complaints today.  REVIEW OF SYSTEMS:   Review of Systems  Constitutional: Negative.  Negative for fever, malaise/fatigue and weight loss.  Respiratory: Negative.  Negative for cough and shortness of breath.   Cardiovascular: Negative.  Negative for chest pain and leg swelling.  Gastrointestinal: Negative.  Negative for abdominal pain and diarrhea.  Genitourinary: Negative.  Negative for dysuria.  Musculoskeletal: Negative.  Negative for back pain.  Skin: Negative.  Negative for rash.  Neurological: Negative.  Negative for dizziness, focal weakness, weakness and headaches.  Psychiatric/Behavioral: Negative.  The patient is not nervous/anxious.    As per HPI. Otherwise, a complete review of systems is negative.  PAST MEDICAL HISTORY: Past Medical History:  Diagnosis Date   Anxiety    Aortic stenosis    Diabetes mellitus    Edema    Heart murmur     Hypertension    Sleep apnea    Thyroid disease     PAST SURGICAL HISTORY: Past Surgical History:  Procedure Laterality Date   ABDOMINAL HYSTERECTOMY  11/17/2013   BREAST BIOPSY     x2   BREAST BIOPSY Left 07/20/2021   [place] Bx, Ribbon Clip, Path pending   BREAST BIOPSY Left 07/20/2021   Stereo Bx, X-clip, path pending   BREAST BIOPSY Left 07/20/2021   Stereo biopsy, coil clip, path pending   BREAST BIOPSY Right 07/20/2021   Stereo Bx, Ribbon Clip, path pending   CESAREAN SECTION     x 2   CHOLECYSTECTOMY  12/17/2001   DUE TO STONES   DILATION AND CURETTAGE OF UTERUS     ELBOW SURGERY     POLYPECTOMY  12/17/1993   PORTACATH PLACEMENT Right 10/25/2021   Procedure: INSERTION PORT-A-CATH;  Surgeon: Ronny Bacon, MD;  Location: [facility] ORS;  Service: General;  Laterality: Right;   SIMPLE MASTECTOMY WITH AXILLARY SENTINEL NODE BIOPSY Left 08/16/2021   Procedure: SIMPLE MASTECTOMY WITH AXILLARY SENTINEL NODE BIOPSY;  Surgeon: Ronny Bacon, MD;  Location: [facility] ORS;  Service: General;  Laterality: Left;    FAMILY HISTORY: Family History  Problem Relation Age of Onset   Hyperlipidemia Mother    Thyroid disease Mother    Hashimoto's thyroiditis Mother    Heart disease Father        open heart surgery   Heart failure Father    Diabetes Father    Dementia Father    Thyroid disease Brother    Breast cancer Maternal Grandmother    Diabetes Paternal Grandmother    Prostate cancer Paternal Grandfather  ADVANCED DIRECTIVES (Y/N):  N  HEALTH MAINTENANCE: Social History   Tobacco Use   Smoking status: Former    Packs/day: 1.50    Years: 25.00    Pack years: 37.50    Types: Cigarettes    Quit date: 11/24/2001    Years since quitting: 20.2   Smokeless tobacco: Never  Vaping Use   Vaping Use: Never used  Substance Use Topics   Alcohol use: Yes    Alcohol/week: 0.0 standard drinks    Comment: OCCASIONALLY DRINKS WINE, ONCE A WEEK   Drug use: No      Colonoscopy:  PAP:  Bone density:  Lipid panel:  Allergies  Allergen Reactions   Fosaprepitant Nausea Only and Other (See Comments)    Back pain, nausea. See progress note from 10/27/2021     Current Outpatient Medications  Medication Sig Dispense Refill   Ascorbic Acid (VITAMIN C PO) Take 1 tablet by mouth daily.     aspirin 81 MG EC tablet Take 81 mg by mouth at bedtime.     Cholecalciferol (VITAMIN D3) 1000 UNITS CAPS Take 1,000 Units by mouth in the morning.     diphenoxylate-atropine (LOMOTIL) 2.5-0.025 MG tablet Take 2 tablets by mouth 4 (four) times daily as needed for diarrhea or loose stools. 60 tablet 1   Dulaglutide (TRULICITY) 3 WU/8.8BV SOPN Inject 3 mg as directed once a week. 2 mL 3   Ferrous Sulfate (IRON) 325 (65 Fe) MG TABS Take 1 tablet by mouth once daily with breakfast 90 tablet 0   furosemide (LASIX) 40 MG tablet TAKE 1 TABLET BY MOUTH ONCE DAILY AS NEEDED FOR FLUID OR EDEMA 90 tablet 1   glucose blood (CONTOUR NEXT TEST) test strip Test fasting sugar each morning. Recheck if having hypoglycemic symptoms. 100 each 4   glyBURIDE (DIABETA) 5 MG tablet TAKE 1 TABLET BY MOUTH TWICE DAILY WITH MEALS. 180 tablet 3   hydrochlorothiazide (HYDRODIURIL) 25 MG tablet Take 1 tablet (25 mg total) by mouth daily. 90 tablet 3   levothyroxine (EUTHYROX) 75 MCG tablet TAKE 1 TABLET BY MOUTH ONCE DAILY BEFORE BREAKFAST . 90 tablet 1   lidocaine-prilocaine (EMLA) cream Apply to affected area once 30 g 3   metFORMIN (GLUCOPHAGE) 1000 MG tablet Take 1 tablet (1,000 mg total) by mouth 2 (two) times daily with a meal. 180 tablet 1   Multiple Vitamin (MULTIVITAMIN) tablet Take 1 tablet by mouth every morning.     mupirocin ointment (BACTROBAN) 2 % Apply 1 application topically 2 (two) times daily. 22 g 0   ondansetron (ZOFRAN) 8 MG tablet Take 1 tablet (8 mg total) by mouth 2 (two) times daily as needed for refractory nausea / vomiting. 60 tablet 2   PARoxetine (PAXIL) 20 MG tablet  Take 1 tablet (20 mg total) by mouth daily. 90 tablet 1   zinc gluconate 50 MG tablet Take 50 mg by mouth daily.     carvedilol (COREG) 25 MG tablet Take 1 tablet (25 mg total) by mouth 2 (two) times daily. 180 tablet 1   lisinopril (ZESTRIL) 40 MG tablet Take 1 tablet (40 mg total) by mouth daily. 90 tablet 1   No current facility-administered medications for this visit.    OBJECTIVE: Vitals:   02/08/22 0913  BP: (!) 144/67  Pulse: 69  Resp: 16  Temp: (!) 97.4 F (36.3 C)  SpO2: 98%     Body mass index is 31.64 kg/m.    ECOG FS:0 - Asymptomatic  General:  Well-developed, well-nourished, no acute distress. Eyes: Pink conjunctiva, anicteric sclera. HEENT: Normocephalic, moist mucous membranes. Lungs: No audible wheezing or coughing. Heart: Regular rate and rhythm. Abdomen: Soft, nontender, no obvious distention. Musculoskeletal: No edema, cyanosis, or clubbing. Neuro: Alert, answering all questions appropriately. Cranial nerves grossly intact. Skin: No rashes or petechiae noted. Psych: Normal affect.   LAB RESULTS:  Lab Results  Component Value Date   NA 136 02/08/2022   K 3.6 02/08/2022   CL 104 02/08/2022   CO2 27 02/08/2022   GLUCOSE 225 (H) 02/08/2022   BUN 22 02/08/2022   CREATININE 0.68 02/08/2022   CALCIUM 8.7 (L) 02/08/2022   PROT 7.0 02/08/2022   ALBUMIN 4.0 02/08/2022   AST 33 02/08/2022   ALT 28 02/08/2022   ALKPHOS 62 02/08/2022   BILITOT 0.2 (L) 02/08/2022   GFRNONAA >60 02/08/2022   GFRAA 111 10/14/2020    Lab Results  Component Value Date   WBC 3.8 (L) 02/08/2022   NEUTROABS 2.3 02/08/2022   HGB 10.0 (L) 02/08/2022   HCT 32.3 (L) 02/08/2022   MCV 88.5 02/08/2022   PLT 84 (L) 02/08/2022     STUDIES: ECHOCARDIOGRAM COMPLETE  Result Date: 02/07/2022    ECHOCARDIOGRAM REPORT   Patient Name:   [name] Date of Exam: 02/07/2022 Medical Rec #:  [id number]            Height:       64.0 in Accession #:    [id number]           Weight:        183.4 lb Date of Birth:  Nov 17, 1961            BSA:          1.886 m Patient Age:    61 years             BP:           149/76 mmHg Patient Gender: F                    HR:           67 bpm. Exam Location:  [facility] Procedure: 2D Echo, Strain Analysis, Color Doppler and Cardiac Doppler Indications:     Chemo  History:         Patient has prior history of Echocardiogram examinations, most                  recent 03/14/2021. Aortic Valve Disease; Risk                  Factors:Hypertension and Diabetes.  Sonographer:     Mikki Santee RDCS Referring Phys:  [id number] Lloyd Huger Diagnosing Phys: [name] MD IMPRESSIONS  1. Left ventricular ejection fraction, by estimation, is 60 to 65%. Left ventricular ejection fraction by 2D MOD biplane is 62.9 %. The left ventricle has normal function. The left ventricle has no regional wall motion abnormalities. Left ventricular diastolic parameters are consistent with Grade II diastolic dysfunction (pseudonormalization). The average left ventricular global longitudinal strain is -23.6 %. The global longitudinal strain is normal.  2. Right ventricular systolic function is normal. The right ventricular size is not well visualized.  3. The mitral valve is normal in structure. No evidence of mitral valve regurgitation.  4. The aortic valve was not well visualized. Aortic valve regurgitation is not visualized. Mild to moderate aortic valve stenosis.  5. The inferior vena cava is normal in size  with <50% respiratory variability, suggesting right atrial pressure of 8 mmHg. FINDINGS  Left Ventricle: Left ventricular ejection fraction, by estimation, is 60 to 65%. Left ventricular ejection fraction by 2D MOD biplane is 62.9 %. The left ventricle has normal function. The left ventricle has no regional wall motion abnormalities. The average left ventricular global longitudinal strain is -23.6 %. The global longitudinal strain is normal. The left ventricular internal cavity  size was normal in size. There is no left ventricular hypertrophy. Left ventricular diastolic parameters are consistent with Grade II diastolic dysfunction (pseudonormalization). Right Ventricle: The right ventricular size is not well visualized. No increase in right ventricular wall thickness. Right ventricular systolic function is normal. Left Atrium: Left atrial size was normal in size. Right Atrium: Right atrial size was normal in size. Pericardium: There is no evidence of pericardial effusion. Mitral Valve: The mitral valve is normal in structure. No evidence of mitral valve regurgitation. Tricuspid Valve: The tricuspid valve is normal in structure. Tricuspid valve regurgitation is not demonstrated. Aortic Valve: The aortic valve was not well visualized. Aortic valve regurgitation is not visualized. Mild to moderate aortic stenosis is present. Aortic valve mean gradient measures 17.0 mmHg. Aortic valve peak gradient measures 32.4 mmHg. Aortic valve area, by VTI measures 1.39 cm. Pulmonic Valve: The pulmonic valve was normal in structure. Pulmonic valve regurgitation is not visualized. Aorta: The aortic root and ascending aorta are structurally normal, with no evidence of dilitation. Venous: The inferior vena cava is normal in size with less than 50% respiratory variability, suggesting right atrial pressure of 8 mmHg. IAS/Shunts: No atrial level shunt detected by color flow Doppler.  LEFT VENTRICLE PLAX 2D                        Biplane EF (MOD) LVIDd:         4.00 cm         LV Biplane EF:   Left LVIDs:         2.50 cm                          ventricular LV PW:         1.00 cm                          ejection LV IVS:        0.90 cm                          fraction by LVOT diam:     2.20 cm                          2D MOD LV SV:         100                              biplane is LV SV Index:   53                               62.9 %. LVOT Area:     3.80 cm  Diastology                                 LV e' medial:    7.58 cm/s LV Volumes (MOD)               LV E/e' medial:  14.5 LV vol d, MOD    47.6 ml       LV e' lateral:   7.65 cm/s A2C:                           LV E/e' lateral: 14.4 LV vol d, MOD    69.1 ml A4C:                           2D LV vol s, MOD    17.4 ml       Longitudinal A2C:                           Strain LV vol s, MOD    27.4 ml       2D Strain GLS  -23.6 % A4C:                           Avg: LV SV MOD A2C:   30.2 ml LV SV MOD A4C:   69.1 ml LV SV MOD BP:    37.9 ml RIGHT VENTRICLE RV S prime:     14.80 cm/s TAPSE (M-mode): 3.1 cm LEFT ATRIUM           Index        RIGHT ATRIUM           Index LA diam:      3.40 cm 1.80 cm/m   RA Area:     17.00 cm LA Vol (A2C): 68.6 ml 36.38 ml/m  RA Volume:   44.70 ml  23.71 ml/m LA Vol (A4C): 35.5 ml 18.83 ml/m  AORTIC VALVE AV Area (Vmax):    1.24 cm AV Area (Vmean):   1.40 cm AV Area (VTI):     1.39 cm AV Vmax:           284.50 cm/s AV Vmean:          193.500 cm/s AV VTI:            0.723 m AV Peak Grad:      32.4 mmHg AV Mean Grad:      17.0 mmHg LVOT Vmax:         92.50 cm/s LVOT Vmean:        71.100 cm/s LVOT VTI:          0.264 m LVOT/AV VTI ratio: 0.37  AORTA Ao Root diam: 2.50 cm MITRAL VALVE MV Area (PHT): 2.73 cm     SHUNTS MV Decel Time: 278 msec     Systemic VTI:  0.26 m MV E velocity: 110.00 cm/s  Systemic Diam: 2.20 cm MV A velocity: 86.40 cm/s MV E/A ratio:  1.27 [name] MD Electronically signed by [name] MD Signature Date/Time: 02/07/2022/2:35:10 PM    Final     ASSESSMENT: 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  Previously, right breast biopsy was negative for malignancy.  Given stage and HER2 status of disease, initial plan was  to give adjuvant chemotherapy using Taxotere, carboplatinum, Perjeta, and Herceptin every 3 weeks for 6 cycles with Udenyca support and then maintenance Herceptin and Perjeta every 3 weeks for an entire year.  She will also benefit from letrozole for 5 years at the conclusion of all of her treatments.  Recent cardiac echo on February 07, 2022 revealed an EF of 60 to 65%. Patient has had port placement.  Patient only received 3 cycles of treatment, but carboplatinum and Taxotere were discontinued secondary to persistent thrombocytopenia.  Patient was subsequently transitioned to maintenance Perjeta and Herceptin.  Patient last received carboplatinum and Taxotere on December 22, 2021.  Proceed with cycle 5 of maintenance treatment today.  Return to clinic in 3 weeks for further evaluation and consideration of cycle 6. Mastectomy wound: Resolved.  Continue follow-up with surgery as scheduled.  Diarrhea: Mild.  Continue Imodium as needed.   Leukopenia: Mild.  Patient does not require further Udenyca now that chemotherapy is being discontinued. Thrombocytopenia: Improved to 84.  Proceed with maintenance treatment as above. Fosaprepitant: Patient had an allergic reaction to this premedication and has subsequently been deleted from her treatment regimen. Anemia: Hemoglobin improved to 10.0. Hypomagnesia: Resolved.  Continue OTC supplementation.   Patient expressed understanding and was in agreement with this plan. She also understands that She can call clinic at any time with any questions, concerns, or complaints.    Cancer Staging  Carcinoma of left breast [facility]) Staging form: Breast, AJCC 8th Edition - Pathologic stage from 08/30/2021: Stage IB (pT2, pN1a, cM0, G3, ER+, PR+, HER2+, Oncotype DX score: 18) - Signed by Lloyd Huger, MD on 09/13/2021 Stage prefix: Initial diagnosis Multigene prognostic tests performed: Oncotype DX Recurrence score range: Greater than or equal to 11 Histologic grading  system: 3 grade system  Lloyd Huger, MD   02/08/2022 1:26 PM

## 2022-02-07 ENCOUNTER — Other Ambulatory Visit: Payer: Self-pay

## 2022-02-07 ENCOUNTER — Ambulatory Visit
Admission: RE | Admit: 2022-02-07 | Discharge: 2022-02-07 | Disposition: A | Discharge status: Home / Self Care | Payer: 59 | Source: Ambulatory Visit | Attending: Oncology | Admitting: Oncology

## 2022-02-07 DIAGNOSIS — I10 Essential (primary) hypertension: Secondary | ICD-10-CM | POA: Insufficient documentation

## 2022-02-07 DIAGNOSIS — E119 Type 2 diabetes mellitus without complications: Secondary | ICD-10-CM | POA: Diagnosis not present

## 2022-02-07 DIAGNOSIS — Z170 Estrogen receptor positive status [ER+]: Secondary | ICD-10-CM

## 2022-02-07 DIAGNOSIS — Z01818 Encounter for other preprocedural examination: Secondary | ICD-10-CM | POA: Insufficient documentation

## 2022-02-07 DIAGNOSIS — C50812 Malignant neoplasm of overlapping sites of left female breast: Secondary | ICD-10-CM | POA: Insufficient documentation

## 2022-02-07 DIAGNOSIS — Z0189 Encounter for other specified special examinations: Secondary | ICD-10-CM

## 2022-02-07 DIAGNOSIS — I350 Nonrheumatic aortic (valve) stenosis: Secondary | ICD-10-CM | POA: Diagnosis not present

## 2022-02-07 LAB — ECHOCARDIOGRAM COMPLETE
AR max vel: 1.24 cm2
AV Area VTI: 1.39 cm2
AV Area mean vel: 1.4 cm2
AV Mean grad: 17 mmHg
AV Peak grad: 32.4 mmHg
Ao pk vel: 2.85 m/s
Area-P 1/2: 2.73 cm2
Calc EF: 62.9 %
S' Lateral: 2.5 cm
Single Plane A2C EF: 63.4 %
Single Plane A4C EF: 60.3 %

## 2022-02-08 ENCOUNTER — Encounter: Payer: Self-pay | Admitting: Oncology

## 2022-02-08 ENCOUNTER — Inpatient Hospital Stay: Payer: 59

## 2022-02-08 ENCOUNTER — Inpatient Hospital Stay (HOSPITAL_BASED_OUTPATIENT_CLINIC_OR_DEPARTMENT_OTHER): Payer: 59 | Admitting: Oncology

## 2022-02-08 VITALS — BP 144/67 | HR 69 | Temp 97.4°F | Resp 16 | Ht 64.0 in | Wt 184.3 lb

## 2022-02-08 DIAGNOSIS — Z5112 Encounter for antineoplastic immunotherapy: Secondary | ICD-10-CM | POA: Diagnosis not present

## 2022-02-08 DIAGNOSIS — C50812 Malignant neoplasm of overlapping sites of left female breast: Secondary | ICD-10-CM

## 2022-02-08 DIAGNOSIS — Z170 Estrogen receptor positive status [ER+]: Secondary | ICD-10-CM

## 2022-02-08 DIAGNOSIS — R197 Diarrhea, unspecified: Secondary | ICD-10-CM | POA: Diagnosis not present

## 2022-02-08 DIAGNOSIS — D649 Anemia, unspecified: Secondary | ICD-10-CM | POA: Diagnosis not present

## 2022-02-08 DIAGNOSIS — I350 Nonrheumatic aortic (valve) stenosis: Secondary | ICD-10-CM | POA: Diagnosis not present

## 2022-02-08 DIAGNOSIS — D6959 Other secondary thrombocytopenia: Secondary | ICD-10-CM | POA: Diagnosis not present

## 2022-02-08 DIAGNOSIS — T451X5A Adverse effect of antineoplastic and immunosuppressive drugs, initial encounter: Secondary | ICD-10-CM | POA: Diagnosis not present

## 2022-02-08 DIAGNOSIS — D696 Thrombocytopenia, unspecified: Secondary | ICD-10-CM | POA: Diagnosis not present

## 2022-02-08 DIAGNOSIS — I10 Essential (primary) hypertension: Secondary | ICD-10-CM | POA: Diagnosis not present

## 2022-02-08 LAB — CBC WITH DIFFERENTIAL/PLATELET
Abs Immature Granulocytes: 0.01 10*3/uL (ref 0.00–0.07)
Basophils Absolute: 0 10*3/uL (ref 0.0–0.1)
Basophils Relative: 0 %
Eosinophils Absolute: 0.1 10*3/uL (ref 0.0–0.5)
Eosinophils Relative: 2 %
HCT: 32.3 % — ABNORMAL LOW (ref 36.0–46.0)
Hemoglobin: 10 g/dL — ABNORMAL LOW (ref 12.0–15.0)
Immature Granulocytes: 0 %
Lymphocytes Relative: 32 %
Lymphs Abs: 1.2 10*3/uL (ref 0.7–4.0)
MCH: 27.4 pg (ref 26.0–34.0)
MCHC: 31 g/dL (ref 30.0–36.0)
MCV: 88.5 fL (ref 80.0–100.0)
Monocytes Absolute: 0.2 10*3/uL (ref 0.1–1.0)
Monocytes Relative: 6 %
Neutro Abs: 2.3 10*3/uL (ref 1.7–7.7)
Neutrophils Relative %: 60 %
Platelets: 84 10*3/uL — ABNORMAL LOW (ref 150–400)
RBC: 3.65 MIL/uL — ABNORMAL LOW (ref 3.87–5.11)
RDW: 15.7 % — ABNORMAL HIGH (ref 11.5–15.5)
WBC: 3.8 10*3/uL — ABNORMAL LOW (ref 4.0–10.5)
nRBC: 0 % (ref 0.0–0.2)

## 2022-02-08 LAB — COMPREHENSIVE METABOLIC PANEL
ALT: 28 U/L (ref 0–44)
AST: 33 U/L (ref 15–41)
Albumin: 4 g/dL (ref 3.5–5.0)
Alkaline Phosphatase: 62 U/L (ref 38–126)
Anion gap: 5 (ref 5–15)
BUN: 22 mg/dL (ref 8–23)
CO2: 27 mmol/L (ref 22–32)
Calcium: 8.7 mg/dL — ABNORMAL LOW (ref 8.9–10.3)
Chloride: 104 mmol/L (ref 98–111)
Creatinine, Ser: 0.68 mg/dL (ref 0.44–1.00)
GFR, Estimated: >60 mL/min (ref >60)
Glucose, Bld: 225 mg/dL — ABNORMAL HIGH (ref 70–99)
Potassium: 3.6 mmol/L (ref 3.5–5.1)
Sodium: 136 mmol/L (ref 135–145)
Total Bilirubin: 0.2 mg/dL — ABNORMAL LOW (ref 0.3–1.2)
Total Protein: 7 g/dL (ref 6.5–8.1)

## 2022-02-08 LAB — MAGNESIUM: Magnesium: 1.8 mg/dL (ref 1.7–2.4)

## 2022-02-08 MED ORDER — SODIUM CHLORIDE 0.9 % IV SOLN
Freq: Once | INTRAVENOUS | Status: AC
  Filled 2022-02-08: qty 250

## 2022-02-08 MED ORDER — DIPHENHYDRAMINE HCL 25 MG PO CAPS
25.0000 mg | ORAL_CAPSULE | Freq: Once | ORAL | Status: AC
  Administered 2022-02-08: 25 mg via ORAL
  Filled 2022-02-08: qty 1

## 2022-02-08 MED ORDER — SODIUM CHLORIDE 0.9 % IV SOLN
420.0000 mg | Freq: Once | INTRAVENOUS | Status: AC
  Administered 2022-02-08: 420 mg via INTRAVENOUS
  Filled 2022-02-08: qty 14

## 2022-02-08 MED ORDER — TRASTUZUMAB-DKST CHEMO 150 MG IV SOLR
6.0000 mg/kg | Freq: Once | INTRAVENOUS | Status: AC
  Administered 2022-02-08: 504 mg via INTRAVENOUS
  Filled 2022-02-08: qty 24

## 2022-02-08 MED ORDER — ACETAMINOPHEN 325 MG PO TABS
650.0000 mg | ORAL_TABLET | Freq: Once | ORAL | Status: AC
  Administered 2022-02-08: 650 mg via ORAL
  Filled 2022-02-08: qty 2

## 2022-02-08 MED ORDER — HEPARIN SOD (PORK) LOCK FLUSH 100 UNIT/ML IV SOLN
INTRAVENOUS | Status: AC
  Filled 2022-02-08: qty 5

## 2022-02-08 NOTE — Patient Instructions (Signed)
[facility] AT [place]  Discharge Instructions: Thank you for choosing [facility] to provide your oncology and hematology care.  If you have a lab appointment with the [facility], please go directly to the [facility] and check in at the registration area.  Wear comfortable clothing and clothing appropriate for easy access to any Portacath or PICC line.   We strive to give you quality time with your provider. You may need to reschedule your appointment if you arrive late (15 or more minutes).  Arriving late affects you and other patients whose appointments are after yours.  Also, if you miss three or more appointments without notifying the office, you may be dismissed from the clinic at the providers discretion.      For prescription refill requests, have your pharmacy contact our office and allow 72 hours for refills to be completed.    Today you received the following chemotherapy and/or immunotherapy agents : Herceptin / Perjeta      To help prevent nausea and vomiting after your treatment, we encourage you to take your nausea medication as directed.  BELOW ARE SYMPTOMS THAT SHOULD BE REPORTED IMMEDIATELY: *FEVER GREATER THAN 100.4 F (38 C) OR HIGHER *CHILLS OR SWEATING *NAUSEA AND VOMITING THAT IS NOT CONTROLLED WITH YOUR NAUSEA MEDICATION *UNUSUAL SHORTNESS OF BREATH *UNUSUAL BRUISING OR BLEEDING *URINARY PROBLEMS (pain or burning when urinating, or frequent urination) *BOWEL PROBLEMS (unusual diarrhea, constipation, pain near the anus) TENDERNESS IN MOUTH AND THROAT WITH OR WITHOUT PRESENCE OF ULCERS (sore throat, sores in mouth, or a toothache) UNUSUAL RASH, SWELLING OR PAIN  UNUSUAL VAGINAL DISCHARGE OR ITCHING   Items with * indicate a potential emergency and should be followed up as soon as possible or go to the Emergency Department if any problems should occur.  Please show the CHEMOTHERAPY ALERT CARD or IMMUNOTHERAPY ALERT CARD at  check-in to the Emergency Department and triage nurse.  Should you have questions after your visit or need to cancel or reschedule your appointment, please contact [facility] [place] [facility] [place]  [phone] and follow the prompts.  Office hours are 8:00 a.m. to 4:30 p.m. Monday - Friday. Please note that voicemails left after 4:00 p.m. may not be returned until the following business day.  We are closed weekends and major holidays. You have access to a nurse at all times for urgent questions. Please call the main number to the clinic [phone] and follow the prompts.  For any non-urgent questions, you may also contact your provider using MyChart. We now offer e-Visits for anyone 78 and older to request care online for non-urgent symptoms. For details visit [email or web address].   Also download the MyChart app! Go to the app store, search "MyChart", open the app, select [facility], and log in with your MyChart username and password.  Due to Covid, a mask is required upon entering the hospital/clinic. If you do not have a mask, one will be given to you upon arrival. For doctor visits, patients may have 1 support person aged 56 or older with them. For treatment visits, patients cannot have anyone with them due to current Covid guidelines and our immunocompromised population.

## 2022-02-13 ENCOUNTER — Encounter: Payer: Self-pay | Admitting: Surgery

## 2022-02-13 ENCOUNTER — Other Ambulatory Visit: Payer: Self-pay

## 2022-02-13 ENCOUNTER — Ambulatory Visit (INDEPENDENT_AMBULATORY_CARE_PROVIDER_SITE_OTHER): Payer: 59 | Admitting: Surgery

## 2022-02-13 VITALS — BP 138/74 | HR 69 | Temp 97.8°F | Ht 64.0 in | Wt 185.6 lb

## 2022-02-13 DIAGNOSIS — L929 Granulomatous disorder of the skin and subcutaneous tissue, unspecified: Secondary | ICD-10-CM | POA: Diagnosis not present

## 2022-02-13 NOTE — Progress Notes (Signed)
Emily Tapia returns today for follow-up of her left chest wall wound.  She has been utilizing the silver Aquacel within a Telfa like barrier.  She has also been using little Silvadene on it as well.  Nevertheless her wound is nearly completely healed there are just 2 punctate areas separated by an epidermal bridge that has not completed.  There is no evidence of any infection, drainage or bleeding.  No evidence of any proud flesh present. I believe we will continue to use the current dressing without Silvadene or even no dressing at all and she will finish the healing process.  We will be glad to see her back as needed.

## 2022-02-13 NOTE — Patient Instructions (Signed)
If you have any concerns or questions, please feel free to call our office. Follow up as needed.  

## 2022-02-23 NOTE — Progress Notes (Unsigned)
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    INTERVAL HISTORY: Patient returns to clinic today for further evaluation and consideration of cycle 5 of Herceptin and Perjeta only.  She currently feels well and is asymptomatic. Her surgical wound is essentially healed. She has no neurologic complaints.  She denies any recent fevers or illnesses.  She has a good appetite and denies weight loss.  She has no chest pain, shortness of breath, cough, or hemoptysis.  She denies any nausea, vomiting, constipation, or diarrhea.  She has no urinary complaints.  Patient offers no specific complaints today.  REVIEW OF SYSTEMS:   Review of Systems  Constitutional: Negative.  Negative for fever, malaise/fatigue and weight loss.  Respiratory: Negative.  Negative for cough and shortness of breath.   Cardiovascular: Negative.  Negative for chest pain and leg swelling.  Gastrointestinal: Negative.  Negative for abdominal pain and diarrhea.  Genitourinary: Negative.  Negative for dysuria.  Musculoskeletal: Negative.  Negative for back pain.  Skin: Negative.  Negative for rash.  Neurological: Negative.  Negative for dizziness, focal weakness, weakness and headaches.  Psychiatric/Behavioral: Negative.  The patient is not nervous/anxious.    As per HPI. Otherwise, a complete review of systems is negative.  PAST MEDICAL HISTORY: Past Medical History:  Diagnosis Date   Anxiety    Aortic stenosis    Diabetes mellitus    Edema    Heart murmur     Hypertension    Sleep apnea    Thyroid disease     PAST SURGICAL HISTORY: Past Surgical History:  Procedure Laterality Date   ABDOMINAL HYSTERECTOMY  11/17/2013   BREAST BIOPSY     x2   BREAST BIOPSY Left 07/20/2021   [place] Bx, Ribbon Clip, Path pending   BREAST BIOPSY Left 07/20/2021   Stereo Bx, X-clip, path pending   BREAST BIOPSY Left 07/20/2021   Stereo biopsy, coil clip, path pending   BREAST BIOPSY Right 07/20/2021   Stereo Bx, Ribbon Clip, path pending   CESAREAN SECTION     x 2   CHOLECYSTECTOMY  12/17/2001   DUE TO STONES   DILATION AND CURETTAGE OF UTERUS     ELBOW SURGERY     POLYPECTOMY  12/17/1993   PORTACATH PLACEMENT Right 10/25/2021   Procedure: INSERTION PORT-A-CATH;  Surgeon: Ronny Bacon, MD;  Location: [facility] ORS;  Service: General;  Laterality: Right;   SIMPLE MASTECTOMY WITH AXILLARY SENTINEL NODE BIOPSY Left 08/16/2021   Procedure: SIMPLE MASTECTOMY WITH AXILLARY SENTINEL NODE BIOPSY;  Surgeon: Ronny Bacon, MD;  Location: [facility] ORS;  Service: General;  Laterality: Left;    FAMILY HISTORY: Family History  Problem Relation Age of Onset   Hyperlipidemia Mother    Thyroid disease Mother    Hashimoto's thyroiditis Mother    Heart disease Father        open heart surgery   Heart failure Father    Diabetes Father    Dementia Father    Thyroid disease Brother    Breast cancer Maternal Grandmother    Diabetes Paternal Grandmother    Prostate cancer Paternal Grandfather  ADVANCED DIRECTIVES (Y/N):  N  HEALTH MAINTENANCE: Social History   Tobacco Use   Smoking status: Former    Packs/day: 1.50    Years: 25.00    Pack years: 37.50    Types: Cigarettes    Quit date: 11/24/2001    Years since quitting: 20.2   Smokeless tobacco: Never  Vaping Use   Vaping Use: Never used  Substance Use Topics   Alcohol use: Yes    Alcohol/week: 0.0 standard drinks    Comment: OCCASIONALLY DRINKS WINE, ONCE A WEEK   Drug use: No      Colonoscopy:  PAP:  Bone density:  Lipid panel:  Allergies  Allergen Reactions   Fosaprepitant Nausea Only and Other (See Comments)    Back pain, nausea. See progress note from 10/27/2021     Current Outpatient Medications  Medication Sig Dispense Refill   Ascorbic Acid (VITAMIN C PO) Take 1 tablet by mouth daily.     aspirin 81 MG EC tablet Take 81 mg by mouth at bedtime.     carvedilol (COREG) 25 MG tablet Take 1 tablet (25 mg total) by mouth 2 (two) times daily. 180 tablet 1   Cholecalciferol (VITAMIN D3) 1000 UNITS CAPS Take 1,000 Units by mouth in the morning.     diphenoxylate-atropine (LOMOTIL) 2.5-0.025 MG tablet Take 2 tablets by mouth 4 (four) times daily as needed for diarrhea or loose stools. 60 tablet 1   Dulaglutide (TRULICITY) 3 QZ/3.0QT SOPN Inject 3 mg as directed once a week. 2 mL 3   Ferrous Sulfate (IRON) 325 (65 Fe) MG TABS Take 1 tablet by mouth once daily with breakfast 90 tablet 0   furosemide (LASIX) 40 MG tablet TAKE 1 TABLET BY MOUTH ONCE DAILY AS NEEDED FOR FLUID OR EDEMA 90 tablet 1   glucose blood (CONTOUR NEXT TEST) test strip Test fasting sugar each morning. Recheck if having hypoglycemic symptoms. 100 each 4   glyBURIDE (DIABETA) 5 MG tablet TAKE 1 TABLET BY MOUTH TWICE DAILY WITH MEALS. 180 tablet 3   hydrochlorothiazide (HYDRODIURIL) 25 MG tablet Take 1 tablet (25 mg total) by mouth daily. 90 tablet 3   levothyroxine (EUTHYROX) 75 MCG tablet TAKE 1 TABLET BY MOUTH ONCE DAILY BEFORE BREAKFAST . 90 tablet 1   lidocaine-prilocaine (EMLA) cream Apply to affected area once 30 g 3   lisinopril (ZESTRIL) 40 MG tablet Take 1 tablet (40 mg total) by mouth daily. 90 tablet 1   metFORMIN (GLUCOPHAGE) 1000 MG tablet Take 1 tablet (1,000 mg total) by mouth 2 (two) times daily with a meal. 180 tablet 1   Multiple Vitamin (MULTIVITAMIN) tablet Take 1 tablet by mouth every morning.     mupirocin ointment (BACTROBAN) 2 % Apply 1 application topically 2 (two)  times daily. 22 g 0   ondansetron (ZOFRAN) 8 MG tablet Take 1 tablet (8 mg total) by mouth 2 (two) times daily as needed for refractory nausea / vomiting. 60 tablet 2   PARoxetine (PAXIL) 20 MG tablet Take 1 tablet (20 mg total) by mouth daily. 90 tablet 1   zinc gluconate 50 MG tablet Take 50 mg by mouth daily.     No current facility-administered medications for this visit.    OBJECTIVE: There were no vitals filed for this visit.    There is no height or weight on file to calculate BMI.    ECOG FS:0 - Asymptomatic  General: Well-developed, well-nourished, no acute distress. Eyes: Pink conjunctiva, anicteric sclera. HEENT: Normocephalic, moist  mucous membranes. Lungs: No audible wheezing or coughing. Heart: Regular rate and rhythm. Abdomen: Soft, nontender, no obvious distention. Musculoskeletal: No edema, cyanosis, or clubbing. Neuro: Alert, answering all questions appropriately. Cranial nerves grossly intact. Skin: No rashes or petechiae noted. Psych: Normal affect.   LAB RESULTS:  Lab Results  Component Value Date   NA 136 02/08/2022   K 3.6 02/08/2022   CL 104 02/08/2022   CO2 27 02/08/2022   GLUCOSE 225 (H) 02/08/2022   BUN 22 02/08/2022   CREATININE 0.68 02/08/2022   CALCIUM 8.7 (L) 02/08/2022   PROT 7.0 02/08/2022   ALBUMIN 4.0 02/08/2022   AST 33 02/08/2022   ALT 28 02/08/2022   ALKPHOS 62 02/08/2022   BILITOT 0.2 (L) 02/08/2022   GFRNONAA >60 02/08/2022   GFRAA 111 10/14/2020    Lab Results  Component Value Date   WBC 3.8 (L) 02/08/2022   NEUTROABS 2.3 02/08/2022   HGB 10.0 (L) 02/08/2022   HCT 32.3 (L) 02/08/2022   MCV 88.5 02/08/2022   PLT 84 (L) 02/08/2022     STUDIES: ECHOCARDIOGRAM COMPLETE  Result Date: 02/07/2022    ECHOCARDIOGRAM REPORT   Patient Name:   Emily Tapia Date of Exam: 02/07/2022 Medical Rec #:  [id number]            Height:       64.0 in Accession #:    [id number]           Weight:       183.4 lb Date of Birth:   01-27-1961            BSA:          1.886 m Patient Age:    61 years             BP:           149/76 mmHg Patient Gender: F                    HR:           67 bpm. Exam Location:  [facility] Procedure: 2D Echo, Strain Analysis, Color Doppler and Cardiac Doppler Indications:     Chemo  History:         Patient has prior history of Echocardiogram examinations, most                  recent 03/14/2021. Aortic Valve Disease; Risk                  Factors:Hypertension and Diabetes.  Sonographer:     Mikki Santee RDCS Referring Phys:  [id number] Lloyd Huger Diagnosing Phys: [name] MD IMPRESSIONS  1. Left ventricular ejection fraction, by estimation, is 60 to 65%. Left ventricular ejection fraction by 2D MOD biplane is 62.9 %. The left ventricle has normal function. The left ventricle has no regional wall motion abnormalities. Left ventricular diastolic parameters are consistent with Grade II diastolic dysfunction (pseudonormalization). The average left ventricular global longitudinal strain is -23.6 %. The global longitudinal strain is normal.  2. Right ventricular systolic function is normal. The right ventricular size is not well visualized.  3. The mitral valve is normal in structure. No evidence of mitral valve regurgitation.  4. The aortic valve was not well visualized. Aortic valve regurgitation is not visualized. Mild to moderate aortic valve stenosis.  5. The inferior vena cava is normal in size with <50% respiratory variability, suggesting right atrial pressure of 8 mmHg. FINDINGS  Left Ventricle: Left ventricular ejection fraction, by estimation, is 60 to 65%. Left ventricular ejection fraction by 2D MOD biplane is 62.9 %. The left ventricle has normal function. The left ventricle has no regional wall motion abnormalities. The average left ventricular global longitudinal strain is -23.6 %. The global longitudinal strain is normal. The left ventricular internal cavity size was normal in size. There  is no left ventricular hypertrophy. Left ventricular diastolic parameters are consistent with Grade II diastolic dysfunction (pseudonormalization). Right Ventricle: The right ventricular size is not well visualized. No increase in right ventricular wall thickness. Right ventricular systolic function is normal. Left Atrium: Left atrial size was normal in size. Right Atrium: Right atrial size was normal in size. Pericardium: There is no evidence of pericardial effusion. Mitral Valve: The mitral valve is normal in structure. No evidence of mitral valve regurgitation. Tricuspid Valve: The tricuspid valve is normal in structure. Tricuspid valve regurgitation is not demonstrated. Aortic Valve: The aortic valve was not well visualized. Aortic valve regurgitation is not visualized. Mild to moderate aortic stenosis is present. Aortic valve mean gradient measures 17.0 mmHg. Aortic valve peak gradient measures 32.4 mmHg. Aortic valve area, by VTI measures 1.39 cm. Pulmonic Valve: The pulmonic valve was normal in structure. Pulmonic valve regurgitation is not visualized. Aorta: The aortic root and ascending aorta are structurally normal, with no evidence of dilitation. Venous: The inferior vena cava is normal in size with less than 50% respiratory variability, suggesting right atrial pressure of 8 mmHg. IAS/Shunts: No atrial level shunt detected by color flow Doppler.  LEFT VENTRICLE PLAX 2D                        Biplane EF (MOD) LVIDd:         4.00 cm         LV Biplane EF:   Left LVIDs:         2.50 cm                          ventricular LV PW:         1.00 cm                          ejection LV IVS:        0.90 cm                          fraction by LVOT diam:     2.20 cm                          2D MOD LV SV:         100                              biplane is LV SV Index:   53                               62.9 %. LVOT Area:     3.80 cm                                Diastology  LV e'  medial:    7.58 cm/s LV Volumes (MOD)               LV E/e' medial:  14.5 LV vol d, MOD    47.6 ml       LV e' lateral:   7.65 cm/s A2C:                           LV E/e' lateral: 14.4 LV vol d, MOD    69.1 ml A4C:                           2D LV vol s, MOD    17.4 ml       Longitudinal A2C:                           Strain LV vol s, MOD    27.4 ml       2D Strain GLS  -23.6 % A4C:                           Avg: LV SV MOD A2C:   30.2 ml LV SV MOD A4C:   69.1 ml LV SV MOD BP:    37.9 ml RIGHT VENTRICLE RV S prime:     14.80 cm/s TAPSE (M-mode): 3.1 cm LEFT ATRIUM           Index        RIGHT ATRIUM           Index LA diam:      3.40 cm 1.80 cm/m   RA Area:     17.00 cm LA Vol (A2C): 68.6 ml 36.38 ml/m  RA Volume:   44.70 ml  23.71 ml/m LA Vol (A4C): 35.5 ml 18.83 ml/m  AORTIC VALVE AV Area (Vmax):    1.24 cm AV Area (Vmean):   1.40 cm AV Area (VTI):     1.39 cm AV Vmax:           284.50 cm/s AV Vmean:          193.500 cm/s AV VTI:            0.723 m AV Peak Grad:      32.4 mmHg AV Mean Grad:      17.0 mmHg LVOT Vmax:         92.50 cm/s LVOT Vmean:        71.100 cm/s LVOT VTI:          0.264 m LVOT/AV VTI ratio: 0.37  AORTA Ao Root diam: 2.50 cm MITRAL VALVE MV Area (PHT): 2.73 cm     SHUNTS MV Decel Time: 278 msec     Systemic VTI:  0.26 m MV E velocity: 110.00 cm/s  Systemic Diam: 2.20 cm MV A velocity: 86.40 cm/s MV E/A ratio:  1.27 [name] MD Electronically signed by [name] MD Signature Date/Time: 02/07/2022/2:35:10 PM    Final     ASSESSMENT: 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  Previously, right breast biopsy was negative for malignancy.  Given  stage and HER2 status of disease, initial plan was to give adjuvant  chemotherapy using Taxotere, carboplatinum, Perjeta, and Herceptin every 3 weeks for 6 cycles with Udenyca support and then maintenance Herceptin and Perjeta every 3 weeks for an entire year.  She will also benefit from letrozole for 5 years at the conclusion of all of her treatments.  Recent cardiac echo on February 07, 2022 revealed an EF of 60 to 65%. Patient has had port placement.  Patient only received 3 cycles of treatment, but carboplatinum and Taxotere were discontinued secondary to persistent thrombocytopenia.  Patient was subsequently transitioned to maintenance Perjeta and Herceptin.  Patient last received carboplatinum and Taxotere on December 22, 2021.  Proceed with cycle 5 of maintenance treatment today.  Return to clinic in 3 weeks for further evaluation and consideration of cycle 6. Mastectomy wound: Resolved.  Continue follow-up with surgery as scheduled.  Diarrhea: Mild.  Continue Imodium as needed.   Leukopenia: Mild.  Patient does not require further Udenyca now that chemotherapy is being discontinued. Thrombocytopenia: Improved to 84.  Proceed with maintenance treatment as above. Fosaprepitant: Patient had an allergic reaction to this premedication and has subsequently been deleted from her treatment regimen. Anemia: Hemoglobin improved to 10.0. Hypomagnesia: Resolved.  Continue OTC supplementation.   Patient expressed understanding and was in agreement with this plan. She also understands that She can call clinic at any time with any questions, concerns, or complaints.    Cancer Staging  Carcinoma of left breast [facility]) Staging form: Breast, AJCC 8th Edition - Pathologic stage from 08/30/2021: Stage IB (pT2, pN1a, cM0, G3, ER+, PR+, HER2+, Oncotype DX score: 18) - Signed by Lloyd Huger, MD on 09/13/2021 Stage prefix: Initial diagnosis Multigene prognostic tests performed: Oncotype DX Recurrence score range: Greater than or equal to 11 Histologic grading system: 3 grade  system  Lloyd Huger, MD   02/23/2022 9:25 AM

## 2022-02-28 ENCOUNTER — Encounter: Payer: Self-pay | Admitting: Oncology

## 2022-03-01 ENCOUNTER — Inpatient Hospital Stay: Payer: 59

## 2022-03-01 ENCOUNTER — Inpatient Hospital Stay: Payer: 59 | Attending: Oncology

## 2022-03-01 ENCOUNTER — Inpatient Hospital Stay (HOSPITAL_BASED_OUTPATIENT_CLINIC_OR_DEPARTMENT_OTHER): Payer: 59 | Admitting: Oncology

## 2022-03-01 ENCOUNTER — Other Ambulatory Visit: Payer: Self-pay

## 2022-03-01 ENCOUNTER — Telehealth: Payer: Self-pay

## 2022-03-01 VITALS — BP 152/63 | HR 76 | Temp 97.5°F | Ht 64.0 in | Wt 185.2 lb

## 2022-03-01 DIAGNOSIS — Z833 Family history of diabetes mellitus: Secondary | ICD-10-CM | POA: Insufficient documentation

## 2022-03-01 DIAGNOSIS — Z170 Estrogen receptor positive status [ER+]: Secondary | ICD-10-CM

## 2022-03-01 DIAGNOSIS — Z87891 Personal history of nicotine dependence: Secondary | ICD-10-CM | POA: Diagnosis not present

## 2022-03-01 DIAGNOSIS — D72819 Decreased white blood cell count, unspecified: Secondary | ICD-10-CM | POA: Insufficient documentation

## 2022-03-01 DIAGNOSIS — C50812 Malignant neoplasm of overlapping sites of left female breast: Secondary | ICD-10-CM | POA: Insufficient documentation

## 2022-03-01 DIAGNOSIS — D696 Thrombocytopenia, unspecified: Secondary | ICD-10-CM | POA: Diagnosis not present

## 2022-03-01 DIAGNOSIS — Z79899 Other long term (current) drug therapy: Secondary | ICD-10-CM | POA: Insufficient documentation

## 2022-03-01 DIAGNOSIS — F419 Anxiety disorder, unspecified: Secondary | ICD-10-CM | POA: Insufficient documentation

## 2022-03-01 DIAGNOSIS — Z8249 Family history of ischemic heart disease and other diseases of the circulatory system: Secondary | ICD-10-CM | POA: Diagnosis not present

## 2022-03-01 DIAGNOSIS — R69 Illness, unspecified: Secondary | ICD-10-CM | POA: Diagnosis not present

## 2022-03-01 DIAGNOSIS — Z8042 Family history of malignant neoplasm of prostate: Secondary | ICD-10-CM | POA: Insufficient documentation

## 2022-03-01 DIAGNOSIS — R197 Diarrhea, unspecified: Secondary | ICD-10-CM | POA: Insufficient documentation

## 2022-03-01 DIAGNOSIS — Z803 Family history of malignant neoplasm of breast: Secondary | ICD-10-CM | POA: Insufficient documentation

## 2022-03-01 DIAGNOSIS — Z818 Family history of other mental and behavioral disorders: Secondary | ICD-10-CM | POA: Insufficient documentation

## 2022-03-01 DIAGNOSIS — Z8349 Family history of other endocrine, nutritional and metabolic diseases: Secondary | ICD-10-CM | POA: Diagnosis not present

## 2022-03-01 DIAGNOSIS — Z5112 Encounter for antineoplastic immunotherapy: Secondary | ICD-10-CM | POA: Diagnosis not present

## 2022-03-01 DIAGNOSIS — D649 Anemia, unspecified: Secondary | ICD-10-CM | POA: Diagnosis not present

## 2022-03-01 DIAGNOSIS — I10 Essential (primary) hypertension: Secondary | ICD-10-CM | POA: Insufficient documentation

## 2022-03-01 DIAGNOSIS — Z9049 Acquired absence of other specified parts of digestive tract: Secondary | ICD-10-CM | POA: Diagnosis not present

## 2022-03-01 DIAGNOSIS — E1165 Type 2 diabetes mellitus with hyperglycemia: Secondary | ICD-10-CM | POA: Diagnosis not present

## 2022-03-01 LAB — CBC WITH DIFFERENTIAL/PLATELET
Abs Immature Granulocytes: 0.02 10*3/uL (ref 0.00–0.07)
Basophils Absolute: 0 10*3/uL (ref 0.0–0.1)
Basophils Relative: 1 %
Eosinophils Absolute: 0.1 10*3/uL (ref 0.0–0.5)
Eosinophils Relative: 3 %
HCT: 33 % — ABNORMAL LOW (ref 36.0–46.0)
Hemoglobin: 10.2 g/dL — ABNORMAL LOW (ref 12.0–15.0)
Immature Granulocytes: 1 %
Lymphocytes Relative: 28 %
Lymphs Abs: 1.1 10*3/uL (ref 0.7–4.0)
MCH: 26.5 pg (ref 26.0–34.0)
MCHC: 30.9 g/dL (ref 30.0–36.0)
MCV: 85.7 fL (ref 80.0–100.0)
Monocytes Absolute: 0.2 10*3/uL (ref 0.1–1.0)
Monocytes Relative: 5 %
Neutro Abs: 2.5 10*3/uL (ref 1.7–7.7)
Neutrophils Relative %: 62 %
Platelets: 76 10*3/uL — ABNORMAL LOW (ref 150–400)
RBC: 3.85 MIL/uL — ABNORMAL LOW (ref 3.87–5.11)
RDW: 14.4 % (ref 11.5–15.5)
WBC: 3.9 10*3/uL — ABNORMAL LOW (ref 4.0–10.5)
nRBC: 0 % (ref 0.0–0.2)

## 2022-03-01 LAB — COMPREHENSIVE METABOLIC PANEL
ALT: 25 U/L (ref 0–44)
AST: 27 U/L (ref 15–41)
Albumin: 4 g/dL (ref 3.5–5.0)
Alkaline Phosphatase: 61 U/L (ref 38–126)
Anion gap: 8 (ref 5–15)
BUN: 22 mg/dL (ref 8–23)
CO2: 27 mmol/L (ref 22–32)
Calcium: 8.8 mg/dL — ABNORMAL LOW (ref 8.9–10.3)
Chloride: 101 mmol/L (ref 98–111)
Creatinine, Ser: 0.77 mg/dL (ref 0.44–1.00)
GFR, Estimated: >60 mL/min (ref >60)
Glucose, Bld: 332 mg/dL — ABNORMAL HIGH (ref 70–99)
Potassium: 3.7 mmol/L (ref 3.5–5.1)
Sodium: 136 mmol/L (ref 135–145)
Total Bilirubin: 0.1 mg/dL — ABNORMAL LOW (ref 0.3–1.2)
Total Protein: 6.8 g/dL (ref 6.5–8.1)

## 2022-03-01 LAB — MAGNESIUM: Magnesium: 1.8 mg/dL (ref 1.7–2.4)

## 2022-03-01 MED ORDER — SODIUM CHLORIDE 0.9 % IV SOLN
Freq: Once | INTRAVENOUS | Status: AC
  Filled 2022-03-01: qty 250

## 2022-03-01 MED ORDER — ACETAMINOPHEN 325 MG PO TABS
650.0000 mg | ORAL_TABLET | Freq: Once | ORAL | Status: AC
  Administered 2022-03-01: 650 mg via ORAL
  Filled 2022-03-01: qty 2

## 2022-03-01 MED ORDER — HEPARIN SOD (PORK) LOCK FLUSH 100 UNIT/ML IV SOLN
500.0000 [IU] | Freq: Once | INTRAVENOUS | Status: AC | PRN
  Administered 2022-03-01: 500 [IU]
  Filled 2022-03-01: qty 5

## 2022-03-01 MED ORDER — DIPHENHYDRAMINE HCL 25 MG PO CAPS
25.0000 mg | ORAL_CAPSULE | Freq: Once | ORAL | Status: AC
  Administered 2022-03-01: 25 mg via ORAL
  Filled 2022-03-01: qty 1

## 2022-03-01 MED ORDER — SODIUM CHLORIDE 0.9 % IV SOLN
420.0000 mg | Freq: Once | INTRAVENOUS | Status: AC
  Administered 2022-03-01: 420 mg via INTRAVENOUS
  Filled 2022-03-01: qty 14

## 2022-03-01 MED ORDER — TRASTUZUMAB-DKST CHEMO 150 MG IV SOLR
6.0000 mg/kg | Freq: Once | INTRAVENOUS | Status: AC
  Administered 2022-03-01: 504 mg via INTRAVENOUS
  Filled 2022-03-01: qty 20

## 2022-03-01 NOTE — Telephone Encounter (Signed)
Per [name]:  Please call patient for OV as BG levels have been high at oncology. Thanks EP  I called pt and left VM to call back to schedule ov.  If pt calls back it is okay for [facility] to schedule appt.

## 2022-03-01 NOTE — Patient Instructions (Signed)
[facility]  Discharge Instructions: °Thank you for choosing [facility] to provide your oncology and hematology care.  ° °If you have a lab appointment with the Cancer Center, please go directly to the Cancer Center and check in at the registration area. °  °Wear comfortable clothing and clothing appropriate for easy access to any Portacath or PICC line.  ° °We strive to give you quality time with your provider. You may need to reschedule your appointment if you arrive late (15 or more minutes).  Arriving late affects you and other patients whose appointments are after yours.  Also, if you miss three or more appointments without notifying the office, you may be dismissed from the clinic at the provider’s discretion.    °  °For prescription refill requests, have your pharmacy contact our office and allow 72 hours for refills to be completed.   ° °Today you received the following chemotherapy and/or immunotherapy agents     °  °To help prevent nausea and vomiting after your treatment, we encourage you to take your nausea medication as directed. ° °BELOW ARE SYMPTOMS THAT SHOULD BE REPORTED IMMEDIATELY: °*FEVER GREATER THAN 100.4 F (38 °C) OR HIGHER °*CHILLS OR SWEATING °*NAUSEA AND VOMITING THAT IS NOT CONTROLLED WITH YOUR NAUSEA MEDICATION °*UNUSUAL SHORTNESS OF BREATH °*UNUSUAL BRUISING OR BLEEDING °*URINARY PROBLEMS (pain or burning when urinating, or frequent urination) °*BOWEL PROBLEMS (unusual diarrhea, constipation, pain near the anus) °TENDERNESS IN MOUTH AND THROAT WITH OR WITHOUT PRESENCE OF ULCERS (sore throat, sores in mouth, or a toothache) °UNUSUAL RASH, SWELLING OR PAIN  °UNUSUAL VAGINAL DISCHARGE OR ITCHING  ° °Items with * indicate a potential emergency and should be followed up as soon as possible or go to the Emergency Department if any problems should occur. ° °Please show the CHEMOTHERAPY ALERT CARD or IMMUNOTHERAPY ALERT CARD at check-in to the  Emergency Department and triage nurse. ° °Should you have questions after your visit or need to cancel or reschedule your appointment, please contact [facility]  Dept: [phone]  and follow the prompts.  Office hours are 8:00 a.m. to 4:30 p.m. Monday - Friday. Please note that voicemails left after 4:00 p.m. may not be returned until the following business day.  We are closed weekends and major holidays. You have access to a nurse at all times for urgent questions. Please call the main number to the clinic Dept: [phone] and follow the prompts. ° ° °For any non-urgent questions, you may also contact your provider using MyChart. We now offer e-Visits for anyone 18 and older to request care online for non-urgent symptoms. For details visit [email or web address]. °  °Also download the MyChart app! Go to the app store, search "MyChart", open the app, select , and log in with your MyChart username and password. ° °Due to Covid, a mask is required upon entering the hospital/clinic. If you do not have a mask, one will be given to you upon arrival. For doctor visits, patients may have 1 support person aged 18 or older with them. For treatment visits, patients cannot have anyone with them due to current Covid guidelines and our immunocompromised population.  ° °

## 2022-03-01 NOTE — Progress Notes (Signed)
Pt reports small amount of blood on tissue when blowing her nose last night and this morning. ?

## 2022-03-05 NOTE — Telephone Encounter (Signed)
Scheduled

## 2022-03-13 ENCOUNTER — Ambulatory Visit: Payer: 59 | Admitting: Family Medicine

## 2022-03-13 ENCOUNTER — Encounter: Payer: Self-pay | Admitting: Family Medicine

## 2022-03-13 ENCOUNTER — Other Ambulatory Visit: Payer: Self-pay

## 2022-03-13 ENCOUNTER — Encounter: Payer: Self-pay | Admitting: Oncology

## 2022-03-13 VITALS — BP 146/81 | HR 62 | Temp 97.8°F | Resp 15 | Wt 180.4 lb

## 2022-03-13 DIAGNOSIS — E1159 Type 2 diabetes mellitus with other circulatory complications: Secondary | ICD-10-CM | POA: Insufficient documentation

## 2022-03-13 DIAGNOSIS — E1165 Type 2 diabetes mellitus with hyperglycemia: Secondary | ICD-10-CM | POA: Diagnosis not present

## 2022-03-13 DIAGNOSIS — I350 Nonrheumatic aortic (valve) stenosis: Secondary | ICD-10-CM

## 2022-03-13 DIAGNOSIS — I152 Hypertension secondary to endocrine disorders: Secondary | ICD-10-CM | POA: Diagnosis not present

## 2022-03-13 LAB — POCT GLYCOSYLATED HEMOGLOBIN (HGB A1C): Hemoglobin A1C: 7 % — AB (ref 4.0–5.6)

## 2022-03-13 MED ORDER — AMLODIPINE BESYLATE 5 MG PO TABS: 5.0000 mg | ORAL_TABLET | Freq: Every day | ORAL | 1 refills | Status: DC

## 2022-03-13 NOTE — Assessment & Plan Note (Addendum)
Chronic, unstable ?Denies CP ?Denies SOB/ DOE ?Denies low blood pressure/hypotension ?Denies vision changes ?No LE Edema noted on exam ?Add Norvasc 5 mg to current medications, Coreg 25 mg BID, Lasix 40 mg QD PRN, HCTZ 25 mg QD, Lisinopril 40 mg, QD ?Denies side effects ?Has appt with cards next week; chronic heart murmur without prognosis  ?Seek emergent care if you develop chest pain or chest pressure ? ?

## 2022-03-13 NOTE — Progress Notes (Signed)
?  ?[employer] as a Education administrator for [name], [name].,have documented all relevant documentation on the behalf of [name], [name],as directed by  [name], [name] while in the presence of [name], [name].  ? ?Established patient visit ? ? ?Patient: Emily Tapia   DOB: 30-Mar-1961   61 y.o. Female  MRN: [id number] ?Visit Date: 03/13/2022 ? ?Today's healthcare provider: [name], [name]  ?Re Introduced to nurse practitioner role and practice setting.  All questions answered.  Discussed provider/patient relationship and expectations. ? ?Elevated blood sugars noted at Oncology treatment ? ?Subjective  ?  ?HPI  ?Diabetes Mellitus Type II, Follow-up ? ?Lab Results  ?Component Value Date  ? HGBA1C 7.0 (A) 03/13/2022  ? HGBA1C 6.1 (A) 12/06/2021  ? HGBA1C 6.5 (H) 07/14/2021  ? ?Wt Readings from Last 3 Encounters:  ?03/13/22 180 lb 6.4 oz (81.8 kg)  ?03/01/22 185 lb 3.2 oz (84 kg)  ?02/13/22 185 lb 9.6 oz (84.2 kg)  ? ?Last seen for diabetes 3 months ago.  ?Management since then includes A1c stable ?Decreased appetite d/t cancer treatment. ?She reports excellent compliance with treatment. ?She is not having side effects.  ?Symptoms: ?Yes fatigue No foot ulcerations  ?Yes appetite changes No nausea  ?Yes paresthesia of the feet  No polydipsia  ?Yes polyuria No visual disturbances   ?No vomiting   ? ? ?Home blood sugar records:  not checked ? ?Episodes of hypoglycemia? No  ?  ?Current insulin regiment: trulicity '3mg'$  weekly patient states that she has been of medication since December 2022 ?Most Recent Eye Exam: Patient reports >12 months; encouraged to call for appt at this time ?Current exercise: no regular exercise ?Current diet habits: well balanced ? ?Pertinent Labs: ?Lab Results  ?Component Value Date  ? CHOL 183 07/14/2021  ? HDL 38 (L) 07/14/2021  ? Sesser 120 (H) 07/14/2021  ? TRIG 142 07/14/2021  ? CHOLHDL 5.2 (H) 08/14/2019  ? Lab Results  ?Component Value Date  ? NA 136 03/01/2022  ? K  3.7 03/01/2022  ? CREATININE 0.77 03/01/2022  ? GFRNONAA >60 03/01/2022  ? MICROALBUR 50 12/24/2017  ? LABMICR 26.9 10/14/2020  ?  ? ?---------------------------------------------------------------------------------------------------  ? ?Medications: ?Outpatient Medications Prior to Visit  ?Medication Sig  ? Ascorbic Acid (VITAMIN C PO) Take 1 tablet by mouth daily.  ? aspirin 81 MG EC tablet Take 81 mg by mouth at bedtime.  ? carvedilol (COREG) 25 MG tablet Take 1 tablet (25 mg total) by mouth 2 (two) times daily.  ? Cholecalciferol (VITAMIN D3) 1000 UNITS CAPS Take 1,000 Units by mouth in the morning.  ? diphenoxylate-atropine (LOMOTIL) 2.5-0.025 MG tablet Take 2 tablets by mouth 4 (four) times daily as needed for diarrhea or loose stools.  ? Dulaglutide (TRULICITY) 3 TM/2.2QJ SOPN Inject 3 mg as directed once a week.  ? Ferrous Sulfate (IRON) 325 (65 Fe) MG TABS Take 1 tablet by mouth once daily with breakfast  ? furosemide (LASIX) 40 MG tablet TAKE 1 TABLET BY MOUTH ONCE DAILY AS NEEDED FOR FLUID OR EDEMA  ? glucose blood (CONTOUR NEXT TEST) test strip Test fasting sugar each morning. Recheck if having hypoglycemic symptoms.  ? glyBURIDE (DIABETA) 5 MG tablet TAKE 1 TABLET BY MOUTH TWICE DAILY WITH MEALS.  ? hydrochlorothiazide (HYDRODIURIL) 25 MG tablet Take 1 tablet (25 mg total) by mouth daily.  ? levothyroxine (EUTHYROX) 75 MCG tablet TAKE 1 TABLET BY MOUTH ONCE DAILY BEFORE BREAKFAST .  ?  lidocaine-prilocaine (EMLA) cream Apply to affected area once  ? lisinopril (ZESTRIL) 40 MG tablet Take 1 tablet (40 mg total) by mouth daily.  ? metFORMIN (GLUCOPHAGE) 1000 MG tablet Take 1 tablet (1,000 mg total) by mouth 2 (two) times daily with a meal.  ? Multiple Vitamin (MULTIVITAMIN) tablet Take 1 tablet by mouth every morning.  ? mupirocin ointment (BACTROBAN) 2 % Apply 1 application topically 2 (two) times daily.  ? ondansetron (ZOFRAN) 8 MG tablet Take 1 tablet (8 mg total) by mouth 2 (two) times daily as needed  for refractory nausea / vomiting.  ? PARoxetine (PAXIL) 20 MG tablet Take 1 tablet (20 mg total) by mouth daily.  ? zinc gluconate 50 MG tablet Take 50 mg by mouth daily.  ? ?No facility-administered medications prior to visit.  ? ? ?Review of Systems ? ? ?  Objective  ?  ?BP (!) 146/81   Pulse 62   Temp 97.8 ?F (36.6 ?C) (Temporal)   Resp 15   Wt 180 lb 6.4 oz (81.8 kg)   SpO2 97%   BMI 30.97 kg/m?  ? ? ?Physical Exam ?Vitals and nursing note reviewed.  ?Constitutional:   ?   General: She is not in acute distress. ?   Appearance: Normal appearance. She is obese. She is not ill-appearing, toxic-appearing or diaphoretic.  ?HENT:  ?   Head: Normocephalic and atraumatic.  ?Cardiovascular:  ?   Rate and Rhythm: Normal rate and regular rhythm.  ?   Pulses: Normal pulses.  ?   Heart sounds: Murmur heard.  ?Systolic murmur is present with a grade of 3/6.  ?  No friction rub. No gallop.  ?Pulmonary:  ?   Effort: Pulmonary effort is normal. No respiratory distress.  ?   Breath sounds: Normal breath sounds. No stridor. No wheezing, rhonchi or rales.  ?Chest:  ?   Chest wall: No tenderness.  ?Abdominal:  ?   General: Bowel sounds are normal.  ?   Palpations: Abdomen is soft.  ?Musculoskeletal:     ?   General: No swelling, tenderness, deformity or signs of injury. Normal range of motion.  ?   Right lower leg: No edema.  ?   Left lower leg: No edema.  ?Skin: ?   General: Skin is warm and dry.  ?   Capillary Refill: Capillary refill takes less than 2 seconds.  ?   Coloration: Skin is not jaundiced or pale.  ?   Findings: No bruising, erythema, lesion or rash.  ?Neurological:  ?   General: No focal deficit present.  ?   Mental Status: She is alert and oriented to person, place, and time. Mental status is at baseline.  ?   Cranial Nerves: No cranial nerve deficit.  ?   Sensory: No sensory deficit.  ?   Motor: No weakness.  ?   Coordination: Coordination normal.  ?Psychiatric:     ?   Mood and Affect: Mood normal.     ?    Behavior: Behavior normal.     ?   Thought Content: Thought content normal.     ?   Judgment: Judgment normal.  ?  ? ?Results for orders placed or performed in visit on 03/13/22  ?POCT glycosylated hemoglobin (Hb A1C)  ?Result Value Ref Range  ? Hemoglobin A1C 7.0 (A) 4.0 - 5.6 %  ? HbA1c POC (<> result, manual entry)    ? HbA1c, POC (prediabetic range)    ? HbA1c, POC (controlled  diabetic range)    ? ? Assessment & Plan  ?  ? ?Problem List Items Addressed This Visit   ? ?  ? Cardiovascular and Mediastinum  ? Aortic valve stenosis  ?  Hx of valve stenosis now with worsening murmur ?Has f/u appt scheduled ?Pt is on many medications for BP ?May need repeat ECHO  ? ?  ?  ? Relevant Medications  ? amLODipine (NORVASC) 5 MG tablet  ? Hypertension associated with diabetes ([place])  ?  Chronic, unstable ?Denies CP ?Denies SOB/ DOE ?Denies low blood pressure/hypotension ?Denies vision changes ?No LE Edema noted on exam ?Add Norvasc 5 mg to current medications, Coreg 25 mg BID, Lasix 40 mg QD PRN, HCTZ 25 mg QD, Lisinopril 40 mg, QD ?Denies side effects ?Has appt with cards next week; chronic heart murmur without prognosis  ?Seek emergent care if you develop chest pain or chest pressure ? ?  ?  ? Relevant Medications  ? amLODipine (NORVASC) 5 MG tablet  ?  ? Endocrine  ? Type 2 diabetes mellitus with hyperglycemia, without long-term current use of insulin (HCC) - Primary  ?  Chronic, previously stable ?Reports of hyperglycemia in oncology ?A1c today 7% ?Pt has not filled out paperwork to get trulicity ?Continues to be on metformin 1000 mg BID and glyburide 5 mg BID ?Continue to recommend balanced, lower carb meals. Smaller meal size, adding snacks. Choosing water as drink of choice and increasing purposeful exercise. ? ?  ?  ? Relevant Orders  ? POCT glycosylated hemoglobin (Hb A1C) (Completed)  ? Urine Microalbumin w/creat. ratio  ? ? ? ?Return in about 4 weeks (around 04/10/2022) for HTN management.  ?   ? ?I, [name],  [name], have reviewed all documentation for this visit. The documentation on 03/13/22 for the exam, diagnosis, procedures, and orders are all accurate and complete. ? ?[name], [name]  ?[place]

## 2022-03-13 NOTE — Assessment & Plan Note (Signed)
Hx of valve stenosis now with worsening murmur ?Has f/u appt scheduled ?Pt is on many medications for BP ?May need repeat ECHO  ? ?

## 2022-03-13 NOTE — Assessment & Plan Note (Signed)
Chronic, previously stable ?Reports of hyperglycemia in oncology ?A1c today 7% ?Pt has not filled out paperwork to get trulicity ?Continues to be on metformin 1000 mg BID and glyburide 5 mg BID ?Continue to recommend balanced, lower carb meals. Smaller meal size, adding snacks. Choosing water as drink of choice and increasing purposeful exercise. ? ?

## 2022-03-14 LAB — MICROALBUMIN / CREATININE URINE RATIO
Creatinine, Urine: 104.2 mg/dL
Microalb/Creat Ratio: 25 mg/g creat (ref 0–29)
Microalbumin, Urine: 25.9 ug/mL

## 2022-03-16 ENCOUNTER — Encounter: Payer: Self-pay | Admitting: Cardiology

## 2022-03-20 ENCOUNTER — Other Ambulatory Visit: Payer: 59

## 2022-03-22 ENCOUNTER — Inpatient Hospital Stay: Payer: 59 | Attending: Oncology

## 2022-03-22 VITALS — BP 139/17 | HR 65 | Temp 97.8°F | Resp 16 | Wt 179.1 lb

## 2022-03-22 DIAGNOSIS — Z5112 Encounter for antineoplastic immunotherapy: Secondary | ICD-10-CM | POA: Diagnosis not present

## 2022-03-22 DIAGNOSIS — Z170 Estrogen receptor positive status [ER+]: Secondary | ICD-10-CM | POA: Insufficient documentation

## 2022-03-22 DIAGNOSIS — Z8349 Family history of other endocrine, nutritional and metabolic diseases: Secondary | ICD-10-CM | POA: Insufficient documentation

## 2022-03-22 DIAGNOSIS — C50812 Malignant neoplasm of overlapping sites of left female breast: Secondary | ICD-10-CM | POA: Diagnosis not present

## 2022-03-22 DIAGNOSIS — Z8249 Family history of ischemic heart disease and other diseases of the circulatory system: Secondary | ICD-10-CM | POA: Insufficient documentation

## 2022-03-22 DIAGNOSIS — R197 Diarrhea, unspecified: Secondary | ICD-10-CM | POA: Diagnosis not present

## 2022-03-22 DIAGNOSIS — Z803 Family history of malignant neoplasm of breast: Secondary | ICD-10-CM | POA: Diagnosis not present

## 2022-03-22 DIAGNOSIS — I350 Nonrheumatic aortic (valve) stenosis: Secondary | ICD-10-CM | POA: Diagnosis not present

## 2022-03-22 DIAGNOSIS — D649 Anemia, unspecified: Secondary | ICD-10-CM | POA: Insufficient documentation

## 2022-03-22 DIAGNOSIS — Z833 Family history of diabetes mellitus: Secondary | ICD-10-CM | POA: Diagnosis not present

## 2022-03-22 DIAGNOSIS — E1165 Type 2 diabetes mellitus with hyperglycemia: Secondary | ICD-10-CM | POA: Diagnosis not present

## 2022-03-22 DIAGNOSIS — I10 Essential (primary) hypertension: Secondary | ICD-10-CM | POA: Insufficient documentation

## 2022-03-22 DIAGNOSIS — Z79899 Other long term (current) drug therapy: Secondary | ICD-10-CM | POA: Diagnosis not present

## 2022-03-22 DIAGNOSIS — Z8042 Family history of malignant neoplasm of prostate: Secondary | ICD-10-CM | POA: Diagnosis not present

## 2022-03-22 DIAGNOSIS — D696 Thrombocytopenia, unspecified: Secondary | ICD-10-CM | POA: Insufficient documentation

## 2022-03-22 DIAGNOSIS — Z87891 Personal history of nicotine dependence: Secondary | ICD-10-CM | POA: Diagnosis not present

## 2022-03-22 DIAGNOSIS — Z9049 Acquired absence of other specified parts of digestive tract: Secondary | ICD-10-CM | POA: Diagnosis not present

## 2022-03-22 MED ORDER — HEPARIN SOD (PORK) LOCK FLUSH 100 UNIT/ML IV SOLN
500.0000 [IU] | Freq: Once | INTRAVENOUS | Status: AC | PRN
  Administered 2022-03-22: 500 [IU]
  Filled 2022-03-22: qty 5

## 2022-03-22 MED ORDER — SODIUM CHLORIDE 0.9 % IV SOLN
Freq: Once | INTRAVENOUS | Status: AC
  Filled 2022-03-22: qty 250

## 2022-03-22 MED ORDER — DIPHENHYDRAMINE HCL 25 MG PO CAPS
25.0000 mg | ORAL_CAPSULE | Freq: Once | ORAL | Status: AC
  Administered 2022-03-22: 25 mg via ORAL
  Filled 2022-03-22: qty 1

## 2022-03-22 MED ORDER — ACETAMINOPHEN 325 MG PO TABS
650.0000 mg | ORAL_TABLET | Freq: Once | ORAL | Status: AC
  Administered 2022-03-22: 650 mg via ORAL
  Filled 2022-03-22: qty 2

## 2022-03-22 MED ORDER — SODIUM CHLORIDE 0.9% FLUSH
10.0000 mL | INTRAVENOUS | Status: DC | PRN
  Filled 2022-03-22: qty 10

## 2022-03-22 MED ORDER — SODIUM CHLORIDE 0.9 % IV SOLN
420.0000 mg | Freq: Once | INTRAVENOUS | Status: AC
  Administered 2022-03-22: 420 mg via INTRAVENOUS
  Filled 2022-03-22: qty 14

## 2022-03-22 MED ORDER — TRASTUZUMAB-DKST CHEMO 150 MG IV SOLR
6.0000 mg/kg | Freq: Once | INTRAVENOUS | Status: AC
  Administered 2022-03-22: 504 mg via INTRAVENOUS
  Filled 2022-03-22: qty 24

## 2022-03-22 NOTE — Progress Notes (Signed)
Patient monitored x 30 minutes post Perjeta infusion. Patient tolerated infusion well.  ?

## 2022-03-22 NOTE — Patient Instructions (Signed)
[facility] CANCER CTR AT [place]  Discharge Instructions: ?Thank you for choosing [place] [facility] to provide your oncology and hematology care.  ?If you have a lab appointment with the [facility], please go directly to the [facility] and check in at the registration area. ? ?Wear comfortable clothing and clothing appropriate for easy access to any Portacath or PICC line.  ? ?We strive to give you quality time with your provider. You may need to reschedule your appointment if you arrive late (15 or more minutes).  Arriving late affects you and other patients whose appointments are after yours.  Also, if you miss three or more appointments without notifying the office, you may be dismissed from the clinic at the provider?s discretion.    ?  ?For prescription refill requests, have your pharmacy contact our office and allow 72 hours for refills to be completed.   ? ?Today you received the following chemotherapy and/or immunotherapy agents Trastuzumab & Pertuzumab    ?  ?To help prevent nausea and vomiting after your treatment, we encourage you to take your nausea medication as directed. ? ?BELOW ARE SYMPTOMS THAT SHOULD BE REPORTED IMMEDIATELY: ?*FEVER GREATER THAN 100.4 F (38 ?C) OR HIGHER ?*CHILLS OR SWEATING ?*NAUSEA AND VOMITING THAT IS NOT CONTROLLED WITH YOUR NAUSEA MEDICATION ?*UNUSUAL SHORTNESS OF BREATH ?*UNUSUAL BRUISING OR BLEEDING ?*URINARY PROBLEMS (pain or burning when urinating, or frequent urination) ?*BOWEL PROBLEMS (unusual diarrhea, constipation, pain near the anus) ?TENDERNESS IN MOUTH AND THROAT WITH OR WITHOUT PRESENCE OF ULCERS (sore throat, sores in mouth, or a toothache) ?UNUSUAL RASH, SWELLING OR PAIN  ?UNUSUAL VAGINAL DISCHARGE OR ITCHING  ? ?Items with * indicate a potential emergency and should be followed up as soon as possible or go to the Emergency Department if any problems should occur. ? ?Please show the CHEMOTHERAPY ALERT CARD or IMMUNOTHERAPY ALERT CARD  at check-in to the Emergency Department and triage nurse. ? ?Should you have questions after your visit or need to cancel or reschedule your appointment, please contact [facility] CANCER [place] AT [place]  [phone] and follow the prompts.  Office hours are 8:00 a.m. to 4:30 p.m. Monday - Friday. Please note that voicemails left after 4:00 p.m. may not be returned until the following business day.  We are closed weekends and major holidays. You have access to a nurse at all times for urgent questions. Please call the main number to the clinic [phone] and follow the prompts. ? ?For any non-urgent questions, you may also contact your provider using MyChart. We now offer e-Visits for anyone 21 and older to request care online for non-urgent symptoms. For details visit [email or web address]. ?  ?Also download the MyChart app! Go to the app store, search "MyChart", open the app, select [place], and log in with your MyChart username and password. ? ?Due to Covid, a mask is required upon entering the hospital/clinic. If you do not have a mask, one will be given to you upon arrival. For doctor visits, patients may have 1 support person aged 37 or older with them. For treatment visits, patients cannot have anyone with them due to current Covid guidelines and our immunocompromised population.  ?

## 2022-04-06 NOTE — Progress Notes (Signed)
?[place]  ?[phone] [phone] ? ?ID: Emily Tapia OB: 02-Oct-1961  MR#: [id number]  KGU#:[id number] ? ?Patient Care Team: ?[name], [name] as PCP - General (Family Medicine) ?[name], MD as PCP - Cardiology (Cardiology) ?Ronny Bacon, MD as Consulting Physician (General Surgery) ?Lloyd Huger, MD as Consulting Physician (Hematology and Oncology) ? ? 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   ? ?INTERVAL HISTORY: Patient returns to clinic today for further evaluation and consideration of cycle 8 of Herceptin and Perjeta.  She continues to have chronic diarrhea.  She otherwise is tolerating her treatments without significant side effects.  She has no neurologic complaints.  She denies any recent fevers or illnesses.  She has a good appetite and denies weight loss.  She has no chest pain, shortness of breath, cough, or hemoptysis.  She denies any nausea, vomiting, or constipation.  She has no urinary complaints.  Patient offers no further specific complaints today. ? ?REVIEW OF SYSTEMS:   ?Review of Systems  ?Constitutional: Negative.  Negative for fever, malaise/fatigue and weight loss.  ?Respiratory: Negative.  Negative for cough and shortness of breath.   ?Cardiovascular: Negative.  Negative for chest pain and leg swelling.  ?Gastrointestinal:  Positive for diarrhea. Negative for abdominal pain.  ?Genitourinary: Negative.  Negative for dysuria.  ?Musculoskeletal: Negative.  Negative for back pain.  ?Skin: Negative.  Negative for rash.  ?Neurological: Negative.  Negative for dizziness, focal weakness, weakness and headaches.  ?Psychiatric/Behavioral: Negative.  The patient is not nervous/anxious.   ? ?As per HPI. Otherwise, a complete review of systems is negative. ? ?PAST MEDICAL HISTORY: ?Past Medical History:  ?Diagnosis Date  ? Anxiety   ? Aortic stenosis   ? Diabetes mellitus   ? Edema   ?  Heart murmur   ? Hypertension   ? Sleep apnea   ? Thyroid disease   ? ? ?PAST SURGICAL HISTORY: ?Past Surgical History:  ?Procedure Laterality Date  ? ABDOMINAL HYSTERECTOMY  11/17/2013  ? BREAST BIOPSY    ? x2  ? BREAST BIOPSY Left 07/20/2021  ? [place] Bx, Ribbon Clip, Path pending  ? BREAST BIOPSY Left 07/20/2021  ? Stereo Bx, X-clip, path pending  ? BREAST BIOPSY Left 07/20/2021  ? Stereo biopsy, coil clip, path pending  ? BREAST BIOPSY Right 07/20/2021  ? Stereo Bx, Ribbon Clip, path pending  ? CESAREAN SECTION    ? x 2  ? CHOLECYSTECTOMY  12/17/2001  ? DUE TO STONES  ? DILATION AND CURETTAGE OF UTERUS    ? ELBOW SURGERY    ? POLYPECTOMY  12/17/1993  ? PORTACATH PLACEMENT Right 10/25/2021  ? Procedure: INSERTION PORT-A-CATH;  Surgeon: Ronny Bacon, MD;  Location: [facility] ORS;  Service: General;  Laterality: Right;  ? SIMPLE MASTECTOMY WITH AXILLARY SENTINEL NODE BIOPSY Left 08/16/2021  ? Procedure: SIMPLE MASTECTOMY WITH AXILLARY SENTINEL NODE BIOPSY;  Surgeon: Ronny Bacon, MD;  Location: [facility] ORS;  Service: General;  Laterality: Left;  ? ? ?FAMILY HISTORY: ?Family History  ?Problem Relation Age of Onset  ? Hyperlipidemia Mother   ? Thyroid disease Mother   ? Hashimoto's thyroiditis Mother   ? Heart disease Father   ?     open heart surgery  ? Heart failure Father   ? Diabetes Father   ? Dementia Father   ? Thyroid disease Brother   ? Breast cancer Maternal Grandmother   ? Diabetes Paternal Grandmother   ? Prostate cancer  Paternal Grandfather   ? ? ?ADVANCED DIRECTIVES (Y/N):  N ? ?HEALTH MAINTENANCE: ?Social History  ? ?Tobacco Use  ? Smoking status: Former  ?  Packs/day: 1.50  ?  Years: 25.00  ?  Pack years: 37.50  ?  Types: Cigarettes  ?  Quit date: 11/24/2001  ?  Years since quitting: 20.3  ? Smokeless tobacco: Never  ?Vaping Use  ? Vaping Use: Never used  ?Substance Use Topics  ? Alcohol use: Yes  ?  Alcohol/week: 0.0 standard drinks  ?  Comment: OCCASIONALLY DRINKS WINE, ONCE A WEEK  ? Drug use: No   ? ? ? Colonoscopy: ? PAP: ? Bone density: ? Lipid panel: ? ?Allergies  ?Allergen Reactions  ? Fosaprepitant Nausea Only and Other (See Comments)  ?  Back pain, nausea. See progress note from 10/27/2021   ? ? ?Current Outpatient Medications  ?Medication Sig Dispense Refill  ? amLODipine (NORVASC) 5 MG tablet Take 1 tablet (5 mg total) by mouth daily. 90 tablet 1  ? Ascorbic Acid (VITAMIN C PO) Take 1 tablet by mouth daily.    ? aspirin 81 MG EC tablet Take 81 mg by mouth at bedtime.    ? carvedilol (COREG) 25 MG tablet Take 1 tablet (25 mg total) by mouth 2 (two) times daily. 180 tablet 1  ? Cholecalciferol (VITAMIN D3) 1000 UNITS CAPS Take 1,000 Units by mouth in the morning.    ? diphenoxylate-atropine (LOMOTIL) 2.5-0.025 MG tablet Take 2 tablets by mouth 4 (four) times daily as needed for diarrhea or loose stools. 60 tablet 1  ? Ferrous Sulfate (IRON) 325 (65 Fe) MG TABS Take 1 tablet by mouth once daily with breakfast 90 tablet 0  ? furosemide (LASIX) 40 MG tablet TAKE 1 TABLET BY MOUTH ONCE DAILY AS NEEDED FOR FLUID OR EDEMA 90 tablet 1  ? glucose blood (CONTOUR NEXT TEST) test strip Test fasting sugar each morning. Recheck if having hypoglycemic symptoms. 100 each 4  ? glyBURIDE (DIABETA) 5 MG tablet TAKE 1 TABLET BY MOUTH TWICE DAILY WITH MEALS. 180 tablet 3  ? hydrochlorothiazide (HYDRODIURIL) 25 MG tablet Take 1 tablet (25 mg total) by mouth daily. 90 tablet 3  ? levothyroxine (EUTHYROX) 75 MCG tablet TAKE 1 TABLET BY MOUTH ONCE DAILY BEFORE BREAKFAST . 90 tablet 1  ? lidocaine-prilocaine (EMLA) cream Apply to affected area once 30 g 3  ? lisinopril (ZESTRIL) 40 MG tablet Take 1 tablet (40 mg total) by mouth daily. 90 tablet 1  ? metFORMIN (GLUCOPHAGE) 1000 MG tablet Take 1 tablet (1,000 mg total) by mouth 2 (two) times daily with a meal. 180 tablet 1  ? Multiple Vitamin (MULTIVITAMIN) tablet Take 1 tablet by mouth every morning.    ? mupirocin ointment (BACTROBAN) 2 % Apply 1 application topically 2 (two)  times daily. 22 g 0  ? ondansetron (ZOFRAN) 8 MG tablet Take 1 tablet (8 mg total) by mouth 2 (two) times daily as needed for refractory nausea / vomiting. 60 tablet 2  ? PARoxetine (PAXIL) 20 MG tablet Take 1 tablet (20 mg total) by mouth daily. 90 tablet 1  ? zinc gluconate 50 MG tablet Take 50 mg by mouth daily.    ? ?No current facility-administered medications for this visit.  ? ? ?OBJECTIVE: ?Vitals:  ? 04/12/22 0919  ?BP: 123/62  ?Pulse: 67  ?Resp: 16  ?Temp: 97.6 ?F (36.4 ?C)  ?SpO2: 97%  ?   Body mass index is 31.51 kg/m?[place]    ECOG  FS:0 - Asymptomatic ? ?General: Well-developed, well-nourished, no acute distress. ?Eyes: Pink conjunctiva, anicteric sclera. ?HEENT: Normocephalic, moist mucous membranes. ?Lungs: No audible wheezing or coughing. ?Heart: Regular rate and rhythm. ?Abdomen: Soft, nontender, no obvious distention. ?Musculoskeletal: No edema, cyanosis, or clubbing. ?Neuro: Alert, answering all questions appropriately. Cranial nerves grossly intact. ?Skin: No rashes or petechiae noted. ?Psych: Normal affect. ? ?LAB RESULTS: ? ?Lab Results  ?Component Value Date  ? NA 136 04/12/2022  ? K 3.6 04/12/2022  ? CL 101 04/12/2022  ? CO2 27 04/12/2022  ? GLUCOSE 267 (H) 04/12/2022  ? BUN 28 (H) 04/12/2022  ? CREATININE 0.74 04/12/2022  ? CALCIUM 8.6 (L) 04/12/2022  ? PROT 7.1 04/12/2022  ? ALBUMIN 4.1 04/12/2022  ? AST 32 04/12/2022  ? ALT 30 04/12/2022  ? ALKPHOS 64 04/12/2022  ? BILITOT 0.5 04/12/2022  ? GFRNONAA >60 04/12/2022  ? GFRAA 111 10/14/2020  ? ? ?Lab Results  ?Component Value Date  ? WBC 4.7 04/12/2022  ? NEUTROABS 3.0 04/12/2022  ? HGB 11.1 (L) 04/12/2022  ? HCT 35.2 (L) 04/12/2022  ? MCV 82.8 04/12/2022  ? PLT 97 (L) 04/12/2022  ? ? ? ?STUDIES: ?No results found. ? 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   ? ?PLAN:   ? 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  Previously, right breast biopsy was negative for malignancy.  Given stage and HER2 status of disease, initial plan was to give a

## 2022-04-12 ENCOUNTER — Inpatient Hospital Stay: Payer: 59

## 2022-04-12 ENCOUNTER — Encounter: Payer: Self-pay | Admitting: Oncology

## 2022-04-12 ENCOUNTER — Inpatient Hospital Stay (HOSPITAL_BASED_OUTPATIENT_CLINIC_OR_DEPARTMENT_OTHER): Payer: 59 | Admitting: Oncology

## 2022-04-12 VITALS — BP 123/62 | HR 67 | Temp 97.6°F | Resp 16 | Ht 64.0 in | Wt 183.6 lb

## 2022-04-12 VITALS — BP 141/67 | HR 67 | Temp 98.0°F | Resp 17

## 2022-04-12 DIAGNOSIS — I10 Essential (primary) hypertension: Secondary | ICD-10-CM | POA: Diagnosis not present

## 2022-04-12 DIAGNOSIS — Z87891 Personal history of nicotine dependence: Secondary | ICD-10-CM | POA: Diagnosis not present

## 2022-04-12 DIAGNOSIS — Z79899 Other long term (current) drug therapy: Secondary | ICD-10-CM | POA: Diagnosis not present

## 2022-04-12 DIAGNOSIS — D649 Anemia, unspecified: Secondary | ICD-10-CM | POA: Diagnosis not present

## 2022-04-12 DIAGNOSIS — E1165 Type 2 diabetes mellitus with hyperglycemia: Secondary | ICD-10-CM | POA: Diagnosis not present

## 2022-04-12 DIAGNOSIS — I350 Nonrheumatic aortic (valve) stenosis: Secondary | ICD-10-CM | POA: Diagnosis not present

## 2022-04-12 DIAGNOSIS — Z8249 Family history of ischemic heart disease and other diseases of the circulatory system: Secondary | ICD-10-CM | POA: Diagnosis not present

## 2022-04-12 DIAGNOSIS — Z9049 Acquired absence of other specified parts of digestive tract: Secondary | ICD-10-CM | POA: Diagnosis not present

## 2022-04-12 DIAGNOSIS — C50812 Malignant neoplasm of overlapping sites of left female breast: Secondary | ICD-10-CM | POA: Diagnosis not present

## 2022-04-12 DIAGNOSIS — Z8042 Family history of malignant neoplasm of prostate: Secondary | ICD-10-CM | POA: Diagnosis not present

## 2022-04-12 DIAGNOSIS — Z833 Family history of diabetes mellitus: Secondary | ICD-10-CM | POA: Diagnosis not present

## 2022-04-12 DIAGNOSIS — R197 Diarrhea, unspecified: Secondary | ICD-10-CM | POA: Diagnosis not present

## 2022-04-12 DIAGNOSIS — Z5112 Encounter for antineoplastic immunotherapy: Secondary | ICD-10-CM | POA: Diagnosis not present

## 2022-04-12 DIAGNOSIS — D696 Thrombocytopenia, unspecified: Secondary | ICD-10-CM | POA: Diagnosis not present

## 2022-04-12 DIAGNOSIS — Z170 Estrogen receptor positive status [ER+]: Secondary | ICD-10-CM | POA: Diagnosis not present

## 2022-04-12 DIAGNOSIS — Z8349 Family history of other endocrine, nutritional and metabolic diseases: Secondary | ICD-10-CM | POA: Diagnosis not present

## 2022-04-12 DIAGNOSIS — Z803 Family history of malignant neoplasm of breast: Secondary | ICD-10-CM | POA: Diagnosis not present

## 2022-04-12 LAB — CBC WITH DIFFERENTIAL/PLATELET
Abs Immature Granulocytes: 0.03 10*3/uL (ref 0.00–0.07)
Basophils Absolute: 0 10*3/uL (ref 0.0–0.1)
Basophils Relative: 0 %
Eosinophils Absolute: 0.1 10*3/uL (ref 0.0–0.5)
Eosinophils Relative: 3 %
HCT: 35.2 % — ABNORMAL LOW (ref 36.0–46.0)
Hemoglobin: 11.1 g/dL — ABNORMAL LOW (ref 12.0–15.0)
Immature Granulocytes: 1 %
Lymphocytes Relative: 29 %
Lymphs Abs: 1.4 10*3/uL (ref 0.7–4.0)
MCH: 26.1 pg (ref 26.0–34.0)
MCHC: 31.5 g/dL (ref 30.0–36.0)
MCV: 82.8 fL (ref 80.0–100.0)
Monocytes Absolute: 0.2 10*3/uL (ref 0.1–1.0)
Monocytes Relative: 4 %
Neutro Abs: 3 10*3/uL (ref 1.7–7.7)
Neutrophils Relative %: 63 %
Platelets: 97 10*3/uL — ABNORMAL LOW (ref 150–400)
RBC: 4.25 MIL/uL (ref 3.87–5.11)
RDW: 14.7 % (ref 11.5–15.5)
WBC: 4.7 10*3/uL (ref 4.0–10.5)
nRBC: 0 % (ref 0.0–0.2)

## 2022-04-12 LAB — COMPREHENSIVE METABOLIC PANEL
ALT: 30 U/L (ref 0–44)
AST: 32 U/L (ref 15–41)
Albumin: 4.1 g/dL (ref 3.5–5.0)
Alkaline Phosphatase: 64 U/L (ref 38–126)
Anion gap: 8 (ref 5–15)
BUN: 28 mg/dL — ABNORMAL HIGH (ref 8–23)
CO2: 27 mmol/L (ref 22–32)
Calcium: 8.6 mg/dL — ABNORMAL LOW (ref 8.9–10.3)
Chloride: 101 mmol/L (ref 98–111)
Creatinine, Ser: 0.74 mg/dL (ref 0.44–1.00)
GFR, Estimated: >60 mL/min (ref >60)
Glucose, Bld: 267 mg/dL — ABNORMAL HIGH (ref 70–99)
Potassium: 3.6 mmol/L (ref 3.5–5.1)
Sodium: 136 mmol/L (ref 135–145)
Total Bilirubin: 0.5 mg/dL (ref 0.3–1.2)
Total Protein: 7.1 g/dL (ref 6.5–8.1)

## 2022-04-12 LAB — MAGNESIUM: Magnesium: 1.7 mg/dL (ref 1.7–2.4)

## 2022-04-12 MED ORDER — SODIUM CHLORIDE 0.9 % IV SOLN
Freq: Once | INTRAVENOUS | Status: AC
  Filled 2022-04-12: qty 250

## 2022-04-12 MED ORDER — ACETAMINOPHEN 325 MG PO TABS
650.0000 mg | ORAL_TABLET | Freq: Once | ORAL | Status: AC
  Administered 2022-04-12: 650 mg via ORAL
  Filled 2022-04-12: qty 2

## 2022-04-12 MED ORDER — HEPARIN SOD (PORK) LOCK FLUSH 100 UNIT/ML IV SOLN
INTRAVENOUS | Status: AC
  Administered 2022-04-12: 500 [IU]
  Filled 2022-04-12: qty 5

## 2022-04-12 MED ORDER — HEPARIN SOD (PORK) LOCK FLUSH 100 UNIT/ML IV SOLN
500.0000 [IU] | Freq: Once | INTRAVENOUS | Status: AC | PRN
  Filled 2022-04-12: qty 5

## 2022-04-12 MED ORDER — DIPHENHYDRAMINE HCL 25 MG PO CAPS
25.0000 mg | ORAL_CAPSULE | Freq: Once | ORAL | Status: AC
  Administered 2022-04-12: 25 mg via ORAL
  Filled 2022-04-12: qty 1

## 2022-04-12 MED ORDER — SODIUM CHLORIDE 0.9 % IV SOLN
420.0000 mg | Freq: Once | INTRAVENOUS | Status: AC
  Administered 2022-04-12: 420 mg via INTRAVENOUS
  Filled 2022-04-12: qty 14

## 2022-04-12 MED ORDER — TRASTUZUMAB-DKST CHEMO 150 MG IV SOLR
6.0000 mg/kg | Freq: Once | INTRAVENOUS | Status: AC
  Administered 2022-04-12: 504 mg via INTRAVENOUS
  Filled 2022-04-12: qty 20

## 2022-04-12 NOTE — Patient Instructions (Signed)
[facility] CANCER CTR AT [place]  Discharge Instructions: ?Thank you for choosing [facility] to provide your oncology and hematology care.  ?If you have a lab appointment with the [facility], please go directly to the [facility] and check in at the registration area. ? ?Wear comfortable clothing and clothing appropriate for easy access to any Portacath or PICC line.  ? ?We strive to give you quality time with your provider. You may need to reschedule your appointment if you arrive late (15 or more minutes).  Arriving late affects you and other patients whose appointments are after yours.  Also, if you miss three or more appointments without notifying the office, you may be dismissed from the clinic at the provider?s discretion.    ?  ?For prescription refill requests, have your pharmacy contact our office and allow 72 hours for refills to be completed.   ? ?Today you received the following chemotherapy and/or immunotherapy agents Ogivri and Perjeta     ?  ?To help prevent nausea and vomiting after your treatment, we encourage you to take your nausea medication as directed. ? ?BELOW ARE SYMPTOMS THAT SHOULD BE REPORTED IMMEDIATELY: ?*FEVER GREATER THAN 100.4 F (38 ?C) OR HIGHER ?*CHILLS OR SWEATING ?*NAUSEA AND VOMITING THAT IS NOT CONTROLLED WITH YOUR NAUSEA MEDICATION ?*UNUSUAL SHORTNESS OF BREATH ?*UNUSUAL BRUISING OR BLEEDING ?*URINARY PROBLEMS (pain or burning when urinating, or frequent urination) ?*BOWEL PROBLEMS (unusual diarrhea, constipation, pain near the anus) ?TENDERNESS IN MOUTH AND THROAT WITH OR WITHOUT PRESENCE OF ULCERS (sore throat, sores in mouth, or a toothache) ?UNUSUAL RASH, SWELLING OR PAIN  ?UNUSUAL VAGINAL DISCHARGE OR ITCHING  ? ?Items with * indicate a potential emergency and should be followed up as soon as possible or go to the Emergency Department if any problems should occur. ? ?Please show the CHEMOTHERAPY ALERT CARD or IMMUNOTHERAPY ALERT CARD at  check-in to the Emergency Department and triage nurse. ? ?Should you have questions after your visit or need to cancel or reschedule your appointment, please contact [facility] CANCER [place] AT [place]  [phone] and follow the prompts.  Office hours are 8:00 a.m. to 4:30 p.m. Monday - Friday. Please note that voicemails left after 4:00 p.m. may not be returned until the following business day.  We are closed weekends and major holidays. You have access to a nurse at all times for urgent questions. Please call the main number to the clinic [phone] and follow the prompts. ? ?For any non-urgent questions, you may also contact your provider using MyChart. We now offer e-Visits for anyone 43 and older to request care online for non-urgent symptoms. For details visit [email or web address]. ?  ?Also download the MyChart app! Go to the app store, search "MyChart", open the app, select [place], and log in with your MyChart username and password. ? ?Due to Covid, a mask is required upon entering the hospital/clinic. If you do not have a mask, one will be given to you upon arrival. For doctor visits, patients may have 1 support person aged 5 or older with them. For treatment visits, patients cannot have anyone with them due to current Covid guidelines and our immunocompromised population.  ?

## 2022-04-13 ENCOUNTER — Encounter: Payer: Self-pay | Admitting: Oncology

## 2022-04-29 ENCOUNTER — Other Ambulatory Visit: Payer: Self-pay | Admitting: Family Medicine

## 2022-04-29 DIAGNOSIS — F411 Generalized anxiety disorder: Secondary | ICD-10-CM

## 2022-04-30 ENCOUNTER — Ambulatory Visit: Payer: 59

## 2022-04-30 ENCOUNTER — Ambulatory Visit
Admission: RE | Admit: 2022-04-30 | Discharge: 2022-04-30 | Disposition: A | Discharge status: Home / Self Care | Payer: 59 | Source: Ambulatory Visit | Attending: Oncology | Admitting: Oncology

## 2022-04-30 DIAGNOSIS — I350 Nonrheumatic aortic (valve) stenosis: Secondary | ICD-10-CM | POA: Diagnosis not present

## 2022-04-30 DIAGNOSIS — R011 Cardiac murmur, unspecified: Secondary | ICD-10-CM | POA: Diagnosis not present

## 2022-04-30 DIAGNOSIS — C50812 Malignant neoplasm of overlapping sites of left female breast: Secondary | ICD-10-CM | POA: Insufficient documentation

## 2022-04-30 DIAGNOSIS — I10 Essential (primary) hypertension: Secondary | ICD-10-CM | POA: Insufficient documentation

## 2022-04-30 DIAGNOSIS — Z170 Estrogen receptor positive status [ER+]: Secondary | ICD-10-CM | POA: Insufficient documentation

## 2022-04-30 DIAGNOSIS — Z09 Encounter for follow-up examination after completed treatment for conditions other than malignant neoplasm: Secondary | ICD-10-CM | POA: Diagnosis not present

## 2022-04-30 LAB — ECHOCARDIOGRAM COMPLETE
AR max vel: 0.94 cm2
AV Area VTI: 1.16 cm2
AV Area mean vel: 1 cm2
AV Mean grad: 16 mmHg
AV Peak grad: 27.9 mmHg
Ao pk vel: 2.64 m/s
Area-P 1/2: 3.12 cm2
MV VTI: 1.98 cm2
S' Lateral: 2.6 cm

## 2022-04-30 NOTE — Progress Notes (Signed)
*  PRELIMINARY RESULTS* ?Echocardiogram ?2D Echocardiogram has been performed. ? ?Emily Tapia, Emily Tapia ?04/30/2022, 9:46 AM ?

## 2022-05-03 ENCOUNTER — Inpatient Hospital Stay: Payer: 59 | Attending: Oncology

## 2022-05-03 VITALS — BP 145/66 | HR 72 | Temp 97.7°F | Resp 18 | Wt 181.7 lb

## 2022-05-03 DIAGNOSIS — C50812 Malignant neoplasm of overlapping sites of left female breast: Secondary | ICD-10-CM | POA: Insufficient documentation

## 2022-05-03 DIAGNOSIS — Z87891 Personal history of nicotine dependence: Secondary | ICD-10-CM | POA: Diagnosis not present

## 2022-05-03 DIAGNOSIS — Z79899 Other long term (current) drug therapy: Secondary | ICD-10-CM | POA: Diagnosis not present

## 2022-05-03 DIAGNOSIS — R197 Diarrhea, unspecified: Secondary | ICD-10-CM | POA: Insufficient documentation

## 2022-05-03 DIAGNOSIS — Z818 Family history of other mental and behavioral disorders: Secondary | ICD-10-CM | POA: Insufficient documentation

## 2022-05-03 DIAGNOSIS — Z833 Family history of diabetes mellitus: Secondary | ICD-10-CM | POA: Diagnosis not present

## 2022-05-03 DIAGNOSIS — Z8042 Family history of malignant neoplasm of prostate: Secondary | ICD-10-CM | POA: Insufficient documentation

## 2022-05-03 DIAGNOSIS — Z170 Estrogen receptor positive status [ER+]: Secondary | ICD-10-CM | POA: Insufficient documentation

## 2022-05-03 DIAGNOSIS — D696 Thrombocytopenia, unspecified: Secondary | ICD-10-CM | POA: Insufficient documentation

## 2022-05-03 DIAGNOSIS — Z8349 Family history of other endocrine, nutritional and metabolic diseases: Secondary | ICD-10-CM | POA: Insufficient documentation

## 2022-05-03 DIAGNOSIS — Z803 Family history of malignant neoplasm of breast: Secondary | ICD-10-CM | POA: Insufficient documentation

## 2022-05-03 DIAGNOSIS — D649 Anemia, unspecified: Secondary | ICD-10-CM | POA: Insufficient documentation

## 2022-05-03 DIAGNOSIS — Z8249 Family history of ischemic heart disease and other diseases of the circulatory system: Secondary | ICD-10-CM | POA: Diagnosis not present

## 2022-05-03 DIAGNOSIS — E785 Hyperlipidemia, unspecified: Secondary | ICD-10-CM | POA: Insufficient documentation

## 2022-05-03 MED ORDER — TRASTUZUMAB-DKST CHEMO 150 MG IV SOLR
6.0000 mg/kg | Freq: Once | INTRAVENOUS | Status: AC
  Administered 2022-05-03: 504 mg via INTRAVENOUS
  Filled 2022-05-03: qty 24

## 2022-05-03 MED ORDER — DIPHENHYDRAMINE HCL 25 MG PO CAPS
25.0000 mg | ORAL_CAPSULE | Freq: Once | ORAL | Status: AC
  Administered 2022-05-03: 25 mg via ORAL
  Filled 2022-05-03: qty 1

## 2022-05-03 MED ORDER — ACETAMINOPHEN 325 MG PO TABS
650.0000 mg | ORAL_TABLET | Freq: Once | ORAL | Status: AC
  Administered 2022-05-03: 650 mg via ORAL
  Filled 2022-05-03: qty 2

## 2022-05-03 MED ORDER — SODIUM CHLORIDE 0.9 % IV SOLN
Freq: Once | INTRAVENOUS | Status: AC
  Filled 2022-05-03: qty 250

## 2022-05-03 MED ORDER — HEPARIN SOD (PORK) LOCK FLUSH 100 UNIT/ML IV SOLN
500.0000 [IU] | Freq: Once | INTRAVENOUS | Status: AC | PRN
  Administered 2022-05-03: 500 [IU]
  Filled 2022-05-03: qty 5

## 2022-05-03 MED ORDER — SODIUM CHLORIDE 0.9 % IV SOLN
420.0000 mg | Freq: Once | INTRAVENOUS | Status: AC
  Administered 2022-05-03: 420 mg via INTRAVENOUS
  Filled 2022-05-03: qty 14

## 2022-05-03 MED ORDER — SODIUM CHLORIDE 0.9% FLUSH
10.0000 mL | INTRAVENOUS | Status: DC | PRN
  Administered 2022-05-03: 10 mL
  Filled 2022-05-03: qty 10

## 2022-05-03 NOTE — Progress Notes (Signed)
Patient tolerated pertuzumab/ trastuzumab-dkst infusion well, no questions/concerns voiced. Patient stable at discharge. AVS given.

## 2022-05-03 NOTE — Patient Instructions (Signed)
[facility] CANCER CTR AT [place]  Discharge Instructions: Thank you for choosing [facility] to provide your oncology and hematology care.  If you have a lab appointment with the [facility], please go directly to the [facility] and check in at the registration area.  Wear comfortable clothing and clothing appropriate for easy access to any Portacath or PICC line.   We strive to give you quality time with your provider. You may need to reschedule your appointment if you arrive late (15 or more minutes).  Arriving late affects you and other patients whose appointments are after yours.  Also, if you miss three or more appointments without notifying the office, you may be dismissed from the clinic at the provider's discretion.      For prescription refill requests, have your pharmacy contact our office and allow 72 hours for refills to be completed.    Today you received the following chemotherapy and/or immunotherapy agents : pertuzumab  & trastuzumab-dkst   To help prevent nausea and vomiting after your treatment, we encourage you to take your nausea medication as directed.  BELOW ARE SYMPTOMS THAT SHOULD BE REPORTED IMMEDIATELY: *FEVER GREATER THAN 100.4 F (38 C) OR HIGHER *CHILLS OR SWEATING *NAUSEA AND VOMITING THAT IS NOT CONTROLLED WITH YOUR NAUSEA MEDICATION *UNUSUAL SHORTNESS OF BREATH *UNUSUAL BRUISING OR BLEEDING *URINARY PROBLEMS (pain or burning when urinating, or frequent urination) *BOWEL PROBLEMS (unusual diarrhea, constipation, pain near the anus) TENDERNESS IN MOUTH AND THROAT WITH OR WITHOUT PRESENCE OF ULCERS (sore throat, sores in mouth, or a toothache) UNUSUAL RASH, SWELLING OR PAIN  UNUSUAL VAGINAL DISCHARGE OR ITCHING   Items with * indicate a potential emergency and should be followed up as soon as possible or go to the Emergency Department if any problems should occur.  Please show the CHEMOTHERAPY ALERT CARD or IMMUNOTHERAPY ALERT  CARD at check-in to the Emergency Department and triage nurse.  Should you have questions after your visit or need to cancel or reschedule your appointment, please contact [facility] CANCER [place] AT [place]  [phone] and follow the prompts.  Office hours are 8:00 a.m. to 4:30 p.m. Monday - Friday. Please note that voicemails left after 4:00 p.m. may not be returned until the following business day.  We are closed weekends and major holidays. You have access to a nurse at all times for urgent questions. Please call the main number to the clinic [phone] and follow the prompts.  For any non-urgent questions, you may also contact your provider using MyChart. We now offer e-Visits for anyone 48 and older to request care online for non-urgent symptoms. For details visit [email or web address].   Also download the MyChart app! Go to the app store, search "MyChart", open the app, select [place], and log in with your MyChart username and password.  Due to Covid, a mask is required upon entering the hospital/clinic. If you do not have a mask, one will be given to you upon arrival. For doctor visits, patients may have 1 support person aged 17 or older with them. For treatment visits, patients cannot have anyone with them due to current Covid guidelines and our immunocompromised population.

## 2022-05-24 ENCOUNTER — Ambulatory Visit: Payer: 59 | Admitting: Oncology

## 2022-05-24 ENCOUNTER — Inpatient Hospital Stay: Payer: 59

## 2022-05-24 ENCOUNTER — Inpatient Hospital Stay (HOSPITAL_BASED_OUTPATIENT_CLINIC_OR_DEPARTMENT_OTHER): Payer: 59 | Admitting: Oncology

## 2022-05-24 ENCOUNTER — Inpatient Hospital Stay: Payer: 59 | Attending: Oncology

## 2022-05-24 VITALS — BP 131/76 | HR 61 | Temp 98.3°F | Resp 16 | Wt 185.0 lb

## 2022-05-24 DIAGNOSIS — Z818 Family history of other mental and behavioral disorders: Secondary | ICD-10-CM | POA: Insufficient documentation

## 2022-05-24 DIAGNOSIS — E079 Disorder of thyroid, unspecified: Secondary | ICD-10-CM | POA: Insufficient documentation

## 2022-05-24 DIAGNOSIS — F419 Anxiety disorder, unspecified: Secondary | ICD-10-CM | POA: Insufficient documentation

## 2022-05-24 DIAGNOSIS — C50812 Malignant neoplasm of overlapping sites of left female breast: Secondary | ICD-10-CM | POA: Diagnosis not present

## 2022-05-24 DIAGNOSIS — Z79899 Other long term (current) drug therapy: Secondary | ICD-10-CM | POA: Insufficient documentation

## 2022-05-24 DIAGNOSIS — I350 Nonrheumatic aortic (valve) stenosis: Secondary | ICD-10-CM | POA: Insufficient documentation

## 2022-05-24 DIAGNOSIS — R69 Illness, unspecified: Secondary | ICD-10-CM | POA: Diagnosis not present

## 2022-05-24 DIAGNOSIS — Z170 Estrogen receptor positive status [ER+]: Secondary | ICD-10-CM | POA: Insufficient documentation

## 2022-05-24 DIAGNOSIS — Z833 Family history of diabetes mellitus: Secondary | ICD-10-CM | POA: Diagnosis not present

## 2022-05-24 DIAGNOSIS — Z8349 Family history of other endocrine, nutritional and metabolic diseases: Secondary | ICD-10-CM | POA: Diagnosis not present

## 2022-05-24 DIAGNOSIS — D696 Thrombocytopenia, unspecified: Secondary | ICD-10-CM | POA: Diagnosis not present

## 2022-05-24 DIAGNOSIS — K921 Melena: Secondary | ICD-10-CM | POA: Diagnosis not present

## 2022-05-24 DIAGNOSIS — Z8249 Family history of ischemic heart disease and other diseases of the circulatory system: Secondary | ICD-10-CM | POA: Insufficient documentation

## 2022-05-24 DIAGNOSIS — Z9049 Acquired absence of other specified parts of digestive tract: Secondary | ICD-10-CM | POA: Insufficient documentation

## 2022-05-24 DIAGNOSIS — R109 Unspecified abdominal pain: Secondary | ICD-10-CM | POA: Diagnosis not present

## 2022-05-24 DIAGNOSIS — Z5112 Encounter for antineoplastic immunotherapy: Secondary | ICD-10-CM | POA: Insufficient documentation

## 2022-05-24 DIAGNOSIS — Z803 Family history of malignant neoplasm of breast: Secondary | ICD-10-CM | POA: Insufficient documentation

## 2022-05-24 DIAGNOSIS — E119 Type 2 diabetes mellitus without complications: Secondary | ICD-10-CM | POA: Insufficient documentation

## 2022-05-24 DIAGNOSIS — R197 Diarrhea, unspecified: Secondary | ICD-10-CM

## 2022-05-24 DIAGNOSIS — Z8042 Family history of malignant neoplasm of prostate: Secondary | ICD-10-CM | POA: Diagnosis not present

## 2022-05-24 DIAGNOSIS — I10 Essential (primary) hypertension: Secondary | ICD-10-CM | POA: Insufficient documentation

## 2022-05-24 DIAGNOSIS — Z87891 Personal history of nicotine dependence: Secondary | ICD-10-CM | POA: Insufficient documentation

## 2022-05-24 DIAGNOSIS — Z79890 Hormone replacement therapy (postmenopausal): Secondary | ICD-10-CM | POA: Insufficient documentation

## 2022-05-24 LAB — CBC WITH DIFFERENTIAL/PLATELET
Abs Immature Granulocytes: 0.03 10*3/uL (ref 0.00–0.07)
Basophils Absolute: 0 10*3/uL (ref 0.0–0.1)
Basophils Relative: 0 %
Eosinophils Absolute: 0.1 10*3/uL (ref 0.0–0.5)
Eosinophils Relative: 2 %
HCT: 34.2 % — ABNORMAL LOW (ref 36.0–46.0)
Hemoglobin: 10.7 g/dL — ABNORMAL LOW (ref 12.0–15.0)
Immature Granulocytes: 1 %
Lymphocytes Relative: 29 %
Lymphs Abs: 1.3 10*3/uL (ref 0.7–4.0)
MCH: 25.8 pg — ABNORMAL LOW (ref 26.0–34.0)
MCHC: 31.3 g/dL (ref 30.0–36.0)
MCV: 82.6 fL (ref 80.0–100.0)
Monocytes Absolute: 0.2 10*3/uL (ref 0.1–1.0)
Monocytes Relative: 4 %
Neutro Abs: 2.9 10*3/uL (ref 1.7–7.7)
Neutrophils Relative %: 64 %
Platelets: 82 10*3/uL — ABNORMAL LOW (ref 150–400)
RBC: 4.14 MIL/uL (ref 3.87–5.11)
RDW: 15.5 % (ref 11.5–15.5)
WBC: 4.5 10*3/uL (ref 4.0–10.5)
nRBC: 0 % (ref 0.0–0.2)

## 2022-05-24 LAB — COMPREHENSIVE METABOLIC PANEL
ALT: 28 U/L (ref 0–44)
AST: 29 U/L (ref 15–41)
Albumin: 3.9 g/dL (ref 3.5–5.0)
Alkaline Phosphatase: 56 U/L (ref 38–126)
Anion gap: 11 (ref 5–15)
BUN: 18 mg/dL (ref 8–23)
CO2: 29 mmol/L (ref 22–32)
Calcium: 8.8 mg/dL — ABNORMAL LOW (ref 8.9–10.3)
Chloride: 99 mmol/L (ref 98–111)
Creatinine, Ser: 0.6 mg/dL (ref 0.44–1.00)
GFR, Estimated: >60 mL/min (ref >60)
Glucose, Bld: 227 mg/dL — ABNORMAL HIGH (ref 70–99)
Potassium: 3.5 mmol/L (ref 3.5–5.1)
Sodium: 139 mmol/L (ref 135–145)
Total Bilirubin: 0.5 mg/dL (ref 0.3–1.2)
Total Protein: 6.7 g/dL (ref 6.5–8.1)

## 2022-05-24 LAB — MAGNESIUM: Magnesium: 1.7 mg/dL (ref 1.7–2.4)

## 2022-05-24 MED ORDER — ACETAMINOPHEN 325 MG PO TABS
650.0000 mg | ORAL_TABLET | Freq: Once | ORAL | Status: AC
  Administered 2022-05-24: 650 mg via ORAL
  Filled 2022-05-24: qty 2

## 2022-05-24 MED ORDER — SODIUM CHLORIDE 0.9 % IV SOLN
Freq: Once | INTRAVENOUS | Status: AC
  Filled 2022-05-24: qty 250

## 2022-05-24 MED ORDER — HEPARIN SOD (PORK) LOCK FLUSH 100 UNIT/ML IV SOLN
500.0000 [IU] | Freq: Once | INTRAVENOUS | Status: DC | PRN
  Filled 2022-05-24: qty 5

## 2022-05-24 MED ORDER — SODIUM CHLORIDE 0.9% FLUSH
10.0000 mL | INTRAVENOUS | Status: DC | PRN
  Administered 2022-05-24: 10 mL via INTRAVENOUS
  Filled 2022-05-24: qty 10

## 2022-05-24 MED ORDER — HEPARIN SOD (PORK) LOCK FLUSH 100 UNIT/ML IV SOLN
INTRAVENOUS | Status: AC
  Administered 2022-05-24: 500 [IU] via INTRAVENOUS
  Filled 2022-05-24: qty 5

## 2022-05-24 MED ORDER — DIPHENOXYLATE-ATROPINE 2.5-0.025 MG PO TABS: 2.0000 | ORAL_TABLET | Freq: Four times a day (QID) | ORAL | 1 refills | Status: DC | PRN

## 2022-05-24 MED ORDER — SODIUM CHLORIDE 0.9 % IV SOLN
420.0000 mg | Freq: Once | INTRAVENOUS | Status: AC
  Administered 2022-05-24: 420 mg via INTRAVENOUS
  Filled 2022-05-24: qty 14

## 2022-05-24 MED ORDER — DIPHENHYDRAMINE HCL 25 MG PO CAPS
25.0000 mg | ORAL_CAPSULE | Freq: Once | ORAL | Status: AC
  Administered 2022-05-24: 25 mg via ORAL
  Filled 2022-05-24: qty 1

## 2022-05-24 MED ORDER — TRASTUZUMAB-DKST CHEMO 150 MG IV SOLR
6.0000 mg/kg | Freq: Once | INTRAVENOUS | Status: AC
  Administered 2022-05-24: 504 mg via INTRAVENOUS
  Filled 2022-05-24: qty 24

## 2022-05-24 MED ORDER — HEPARIN SOD (PORK) LOCK FLUSH 100 UNIT/ML IV SOLN
500.0000 [IU] | Freq: Once | INTRAVENOUS | Status: AC
  Filled 2022-05-24: qty 5

## 2022-05-24 NOTE — Progress Notes (Signed)
Pt returns for follow-up. Continues with excessive diarrhea impacting her daily life. She also reports several tender subcutaneous nodules to right lower abdomen. No redness or swelling noted.

## 2022-05-24 NOTE — Progress Notes (Signed)
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    PMH- She underwent simple mastectomy on August 16, 2021 which not only revealed invasive carcinoma, but she also had 2 of 3 lymph nodes positive for disease.  Previously, right breast biopsy was negative for malignancy.  Given stage and HER2 status of disease, initial plan was to give adjuvant chemotherapy using Taxotere, carboplatinum, Perjeta, and Herceptin every 3 weeks for 6 cycles with Udenyca support and then maintenance Herceptin and Perjeta every 3 weeks for an entire year.  She will also benefit from letrozole for 5 years at the conclusion of all of her treatments.  Recent cardiac echo on February 07, 2022 revealed an EF of 60 to 65%. Patient has had port placement.  Patient only received 3 cycles of treatment, then carboplatinum and Taxotere were discontinued secondary to persistent thrombocytopenia.  Patient was subsequently transitioned to maintenance Perjeta and Herceptin.  Patient last received carboplatinum and Taxotere on December 22, 2021.   INTERVAL HISTORY: Emily Tapia is a 61 year old female who presents back for next chemotherapy.  She continues to have diarrhea-several episodes daily watery consistency > 6 months.  Uses Imodium up to 4 tabs daily.  States it is starting to interfere with her daily activities and is interested in a stronger antidiarrheal. Has hemorrhoids that occasionally flare that cause rectal bleeding.  Has occasional abdominal pain prior to bowel movement.  Has  had several accidents here recently.  Denies nausea or vomiting.  Otherwise tolerating treatments well.  REVIEW OF SYSTEMS:   Review of Systems  Gastrointestinal:  Positive for abdominal pain, blood in stool and diarrhea.  All other systems reviewed and are negative.   As per HPI. Otherwise, a complete review of systems is negative.  PAST MEDICAL HISTORY: Past Medical History:  Diagnosis Date   Anxiety    Aortic stenosis    Diabetes mellitus    Edema    Heart murmur    Hypertension    Sleep apnea    Thyroid disease     PAST SURGICAL HISTORY: Past Surgical History:  Procedure Laterality Date   ABDOMINAL HYSTERECTOMY  11/17/2013   BREAST BIOPSY     x2   BREAST BIOPSY Left 07/20/2021   [place] Bx, Ribbon Clip, Path pending   BREAST BIOPSY Left 07/20/2021   Stereo Bx, X-clip, path pending   BREAST BIOPSY Left 07/20/2021   Stereo biopsy, coil clip, path pending   BREAST BIOPSY Right 07/20/2021   Stereo Bx, Ribbon Clip, path pending   CESAREAN SECTION     x 2   CHOLECYSTECTOMY  12/17/2001   DUE TO STONES   DILATION AND CURETTAGE OF UTERUS     ELBOW SURGERY     POLYPECTOMY  12/17/1993   PORTACATH PLACEMENT Right 10/25/2021   Procedure: INSERTION PORT-A-CATH;  Surgeon: Ronny Bacon, MD;  Location: [facility] ORS;  Service: General;  Laterality: Right;   SIMPLE MASTECTOMY WITH AXILLARY SENTINEL NODE BIOPSY Left 08/16/2021   Procedure: SIMPLE MASTECTOMY WITH AXILLARY SENTINEL NODE BIOPSY;  Surgeon: Ronny Bacon, MD;  Location: [facility] ORS;  Service: General;  Laterality: Left;  FAMILY HISTORY: Family History  Problem Relation Age of Onset   Hyperlipidemia Mother    Thyroid disease Mother    Hashimoto's thyroiditis Mother    Heart disease Father        open heart surgery   Heart failure Father    Diabetes Father    Dementia Father    Thyroid disease Brother    Breast cancer Maternal Grandmother    Diabetes Paternal Grandmother    Prostate cancer Paternal Grandfather      ADVANCED DIRECTIVES (Y/N):  N  HEALTH MAINTENANCE: Social History   Tobacco Use   Smoking status: Former    Packs/day: 1.50    Years: 25.00    Total pack years: 37.50    Types: Cigarettes    Quit date: 11/24/2001    Years since quitting: 20.5   Smokeless tobacco: Never  Vaping Use   Vaping Use: Never used  Substance Use Topics   Alcohol use: Yes    Alcohol/week: 0.0 standard drinks of alcohol    Comment: OCCASIONALLY DRINKS WINE, ONCE A WEEK   Drug use: No     Colonoscopy:  PAP:  Bone density:  Lipid panel:  Allergies  Allergen Reactions   Fosaprepitant Nausea Only and Other (See Comments)    Back pain, nausea. See progress note from 10/27/2021     Current Outpatient Medications  Medication Sig Dispense Refill   amLODipine (NORVASC) 5 MG tablet Take 1 tablet (5 mg total) by mouth daily. 90 tablet 1   Ascorbic Acid (VITAMIN C PO) Take 1 tablet by mouth daily.     aspirin 81 MG EC tablet Take 81 mg by mouth at bedtime.     carvedilol (COREG) 25 MG tablet Take 1 tablet (25 mg total) by mouth 2 (two) times daily. 180 tablet 1   Cholecalciferol (VITAMIN D3) 1000 UNITS CAPS Take 1,000 Units by mouth in the morning.     diphenoxylate-atropine (LOMOTIL) 2.5-0.025 MG tablet Take 2 tablets by mouth 4 (four) times daily as needed for diarrhea or loose stools. 60 tablet 1   Ferrous Sulfate (IRON) 325 (65 Fe) MG TABS Take 1 tablet by mouth once daily with breakfast 90 tablet 0   furosemide (LASIX) 40 MG tablet TAKE 1 TABLET BY MOUTH ONCE DAILY AS NEEDED FOR FLUID OR EDEMA 90 tablet 1   glucose blood (CONTOUR NEXT TEST) test strip Test fasting sugar each morning. Recheck if having hypoglycemic symptoms. 100 each 4   glyBURIDE (DIABETA) 5 MG tablet TAKE 1 TABLET BY MOUTH TWICE DAILY WITH MEALS. 180 tablet 3   hydrochlorothiazide (HYDRODIURIL) 25 MG tablet Take 1 tablet (25 mg total) by mouth daily. 90 tablet 3   levothyroxine (EUTHYROX) 75 MCG tablet TAKE 1 TABLET BY MOUTH ONCE  DAILY BEFORE BREAKFAST . 90 tablet 1   lidocaine-prilocaine (EMLA) cream Apply to affected area once 30 g 3   lisinopril (ZESTRIL) 40 MG tablet Take 1 tablet (40 mg total) by mouth daily. 90 tablet 1   metFORMIN (GLUCOPHAGE) 1000 MG tablet Take 1 tablet (1,000 mg total) by mouth 2 (two) times daily with a meal. 180 tablet 1   Multiple Vitamin (MULTIVITAMIN) tablet Take 1 tablet by mouth every morning.     mupirocin ointment (BACTROBAN) 2 % Apply 1 application topically 2 (two) times daily. 22 g 0   ondansetron (ZOFRAN) 8 MG tablet Take 1 tablet (8 mg total) by mouth 2 (two) times daily as needed for refractory nausea / vomiting. 60 tablet 2  PARoxetine (PAXIL) 20 MG tablet Take 1 tablet by mouth once daily 90 tablet 0   zinc gluconate 50 MG tablet Take 50 mg by mouth daily.     No current facility-administered medications for this visit.   Facility-Administered Medications Ordered in Other Visits  Medication Dose Route Frequency Provider Last Rate Last Admin   heparin lock flush 100 unit/mL  500 Units Intravenous Once Lloyd Huger, MD       sodium chloride flush (NS) 0.9 % injection 10 mL  10 mL Intravenous PRN Lloyd Huger, MD   10 mL at 05/24/22 6644    OBJECTIVE: There were no vitals filed for this visit.    There is no height or weight on file to calculate BMI.    ECOG FS:0 - Asymptomatic  Physical Exam Constitutional:      Appearance: Normal appearance.  Cardiovascular:     Rate and Rhythm: Normal rate and regular rhythm.     Heart sounds: Murmur heard.  Pulmonary:     Effort: Pulmonary effort is normal.     Breath sounds: Normal breath sounds.  Abdominal:     General: Bowel sounds are normal.  Neurological:     Mental Status: She is alert.     LAB RESULTS:  Lab Results  Component Value Date   NA 136 04/12/2022   K 3.6 04/12/2022   CL 101 04/12/2022   CO2 27 04/12/2022   GLUCOSE 267 (H) 04/12/2022   BUN 28 (H) 04/12/2022   CREATININE 0.74 04/12/2022    CALCIUM 8.6 (L) 04/12/2022   PROT 7.1 04/12/2022   ALBUMIN 4.1 04/12/2022   AST 32 04/12/2022   ALT 30 04/12/2022   ALKPHOS 64 04/12/2022   BILITOT 0.5 04/12/2022   GFRNONAA >60 04/12/2022   GFRAA 111 10/14/2020    Lab Results  Component Value Date   WBC 4.5 05/24/2022   NEUTROABS 2.9 05/24/2022   HGB 10.7 (L) 05/24/2022   HCT 34.2 (L) 05/24/2022   MCV 82.6 05/24/2022   PLT 82 (L) 05/24/2022     STUDIES: ECHOCARDIOGRAM COMPLETE  Result Date: 04/30/2022    ECHOCARDIOGRAM REPORT   Patient Name:   Emily Tapia Date of Exam: 04/30/2022 Medical Rec #:  [id number]            Height:       64.0 in Accession #:    [id number]           Weight:       183.6 lb Date of Birth:  February 08, 1961            BSA:          1.887 m Patient Age:    32 years             BP:           141/67 mmHg Patient Gender: F                    HR:           67 bpm. Exam Location:  [facility] Procedure: 2D Echo, Cardiac Doppler and Color Doppler Indications:     Chemo Z09  History:         Patient has prior history of Echocardiogram examinations, most                  recent 02/07/2022. Signs/Symptoms:Murmur; Risk  Factors:Hypertension. Aortic stenosis.  Sonographer:     Sherrie Sport Referring Phys:  [place] Diagnosing Phys: Yolonda Kida MD  Sonographer Comments: Global longitudinal strain was attempted. IMPRESSIONS  1. Left ventricular ejection fraction, by estimation, is 60 to 65%. The left ventricle has normal function. The left ventricle has no regional wall motion abnormalities. Left ventricular diastolic parameters are consistent with Grade II diastolic dysfunction (pseudonormalization).  2. Right ventricular systolic function is normal. The right ventricular size is normal. Mildly increased right ventricular wall thickness.  3. The mitral valve is normal in structure. Trivial mitral valve regurgitation.  4. The aortic valve is grossly normal. Aortic valve regurgitation is not  visualized. FINDINGS  Left Ventricle: Left ventricular ejection fraction, by estimation, is 60 to 65%. The left ventricle has normal function. The left ventricle has no regional wall motion abnormalities. The left ventricular internal cavity size was normal in size. There is  no concentric left ventricular hypertrophy. Left ventricular diastolic parameters are consistent with Grade II diastolic dysfunction (pseudonormalization). Right Ventricle: The right ventricular size is normal. Mildly increased right ventricular wall thickness. Right ventricular systolic function is normal. Left Atrium: Left atrial size was normal in size. Right Atrium: Right atrial size was normal in size. Pericardium: There is no evidence of pericardial effusion. Mitral Valve: The mitral valve is normal in structure. Trivial mitral valve regurgitation. MV peak gradient, 7.4 mmHg. The mean mitral valve gradient is 2.0 mmHg. Tricuspid Valve: The tricuspid valve is normal in structure. Tricuspid valve regurgitation is mild. Aortic Valve: The aortic valve is grossly normal. Aortic valve regurgitation is not visualized. Aortic valve mean gradient measures 16.0 mmHg. Aortic valve peak gradient measures 27.9 mmHg. Aortic valve area, by VTI measures 1.16 cm. Pulmonic Valve: The pulmonic valve was normal in structure. Pulmonic valve regurgitation is not visualized. Aorta: The ascending aorta was not well visualized. IAS/Shunts: No atrial level shunt detected by color flow Doppler.  LEFT VENTRICLE PLAX 2D LVIDd:         4.00 cm   Diastology LVIDs:         2.60 cm   LV e' medial:    6.53 cm/s LV PW:         1.20 cm   LV E/e' medial:  18.5 LV IVS:        1.40 cm   LV e' lateral:   7.51 cm/s LVOT diam:     2.00 cm   LV E/e' lateral: 16.1 LV SV:         72 LV SV Index:   38 LVOT Area:     3.14 cm                           3D Volume EF:                          3D EF:        69 %                          LV EDV:       213 ml                          LV  ESV:       67 ml  LV SV:        146 ml RIGHT VENTRICLE RV Basal diam:  3.30 cm RV S prime:     13.20 cm/s TAPSE (M-mode): 1.9 cm LEFT ATRIUM             Index        RIGHT ATRIUM           Index LA diam:        3.50 cm 1.86 cm/m   RA Area:     18.20 cm LA Vol (A2C):   62.4 ml 33.08 ml/m  RA Volume:   50.90 ml  26.98 ml/m LA Vol (A4C):   49.9 ml 26.45 ml/m LA Biplane Vol: 57.9 ml 30.69 ml/m  AORTIC VALVE                     PULMONIC VALVE AV Area (Vmax):    0.94 cm      PV Vmax:        0.86 m/s AV Area (Vmean):   1.00 cm      PV Vmean:       57.900 cm/s AV Area (VTI):     1.16 cm      PV VTI:         0.216 m AV Vmax:           264.00 cm/s   PV Peak grad:   3.0 mmHg AV Vmean:          187.000 cm/s  PV Mean grad:   2.0 mmHg AV VTI:            0.615 m       RVOT Peak grad: 4 mmHg AV Peak Grad:      27.9 mmHg AV Mean Grad:      16.0 mmHg LVOT Vmax:         79.00 cm/s LVOT Vmean:        59.800 cm/s LVOT VTI:          0.228 m LVOT/AV VTI ratio: 0.37  AORTA Ao Root diam: 2.50 cm MITRAL VALVE                TRICUSPID VALVE MV Area (PHT): 3.12 cm     TR Peak grad:   13.1 mmHg MV Area VTI:   1.98 cm     TR Vmax:        181.00 cm/s MV Peak grad:  7.4 mmHg MV Mean grad:  2.0 mmHg     SHUNTS MV Vmax:       1.36 m/s     Systemic VTI:  0.23 m MV Vmean:      62.0 cm/s    Systemic Diam: 2.00 cm MV Decel Time: 243 msec     Pulmonic VTI:  0.212 m MV E velocity: 121.00 cm/s MV A velocity: 104.00 cm/s MV E/A ratio:  1.16 Dwayne D Callwood MD Electronically signed by Yolonda Kida MD Signature Date/Time: 04/30/2022/2:30:53 PM    Final     ASSESSMENT: 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    Breast cancer- Clinically tolerating treatments well.  Echo from May looks stable.  Proceed with treatment.  Diarrhea- We will send a prescription for Lomotil.  Continue Imodium as needed.  Referral to GI.  Thrombocytopenia- Chronic but stable.  Platelet count 82,000 today.   Proceed with treatment.  PLAN:  -Reviewed labs. -Proceed with next cycle of Perjeta and Kanjinti. -Repeat echocardiogram August 2023 -  Rx Lomotil 4 times daily for diarrhea.  Continue Imodium. -Return to clinic in 3 weeks for labs and treatment and in 6 weeks for labs, treatment and see Dr. Grayland Ormond.  I spent 25 minutes dedicated to the care of this patient (face-to-face and non-face-to-face) on the date of the encounter to include what is described in the assessment and plan.   Patient expressed understanding and was in agreement with this plan. She also understands that She can call clinic at any time with any questions, concerns, or complaints.    Cancer Staging  Carcinoma of left breast [facility]) Staging form: Breast, AJCC 8th Edition - Pathologic stage from 08/30/2021: Stage IB (pT2, pN1a, cM0, G3, ER+, PR+, HER2+, Oncotype DX score: 18) - Signed by Lloyd Huger, MD on 09/13/2021 Stage prefix: Initial diagnosis Multigene prognostic tests performed: Oncotype DX Recurrence score range: Greater than or equal to 11 Histologic grading system: 3 grade system  Jacquelin Hawking, NP   05/24/2022 8:53 AM

## 2022-05-24 NOTE — Patient Instructions (Signed)
[facility] CANCER CTR AT [place]  Discharge Instructions: Thank you for choosing [facility] to provide your oncology and hematology care.  If you have a lab appointment with the [facility], please go directly to the [facility] and check in at the registration area.  Wear comfortable clothing and clothing appropriate for easy access to any Portacath or PICC line.   We strive to give you quality time with your provider. You may need to reschedule your appointment if you arrive late (15 or more minutes).  Arriving late affects you and other patients whose appointments are after yours.  Also, if you miss three or more appointments without notifying the office, you may be dismissed from the clinic at the provider's discretion.      For prescription refill requests, have your pharmacy contact our office and allow 72 hours for refills to be completed.    Today you received the following chemotherapy and/or immunotherapy agents Herceptin & Perjeta    To help prevent nausea and vomiting after your treatment, we encourage you to take your nausea medication as directed.  BELOW ARE SYMPTOMS THAT SHOULD BE REPORTED IMMEDIATELY: *FEVER GREATER THAN 100.4 F (38 C) OR HIGHER *CHILLS OR SWEATING *NAUSEA AND VOMITING THAT IS NOT CONTROLLED WITH YOUR NAUSEA MEDICATION *UNUSUAL SHORTNESS OF BREATH *UNUSUAL BRUISING OR BLEEDING *URINARY PROBLEMS (pain or burning when urinating, or frequent urination) *BOWEL PROBLEMS (unusual diarrhea, constipation, pain near the anus) TENDERNESS IN MOUTH AND THROAT WITH OR WITHOUT PRESENCE OF ULCERS (sore throat, sores in mouth, or a toothache) UNUSUAL RASH, SWELLING OR PAIN  UNUSUAL VAGINAL DISCHARGE OR ITCHING   Items with * indicate a potential emergency and should be followed up as soon as possible or go to the Emergency Department if any problems should occur.  Please show the CHEMOTHERAPY ALERT CARD or IMMUNOTHERAPY ALERT CARD at  check-in to the Emergency Department and triage nurse.  Should you have questions after your visit or need to cancel or reschedule your appointment, please contact [facility] CANCER [place] AT [place]  [phone] and follow the prompts.  Office hours are 8:00 a.m. to 4:30 p.m. Monday - Friday. Please note that voicemails left after 4:00 p.m. may not be returned until the following business day.  We are closed weekends and major holidays. You have access to a nurse at all times for urgent questions. Please call the main number to the clinic [phone] and follow the prompts.  For any non-urgent questions, you may also contact your provider using MyChart. We now offer e-Visits for anyone 67 and older to request care online for non-urgent symptoms. For details visit [email or web address].   Also download the MyChart app! Go to the app store, search "MyChart", open the app, select [place], and log in with your MyChart username and password.  Due to Covid, a mask is required upon entering the hospital/clinic. If you do not have a mask, one will be given to you upon arrival. For doctor visits, patients may have 1 support person aged 67 or older with them. For treatment visits, patients cannot have anyone with them due to current Covid guidelines and our immunocompromised population.

## 2022-05-24 NOTE — Addendum Note (Signed)
Addended by: Verlon Au on: 05/24/2022 10:05 AM   Modules accepted: Orders

## 2022-06-01 NOTE — Progress Notes (Unsigned)
Established patient visit   Patient: Emily Tapia   DOB: 1961/04/21   61 y.o. Female  MRN: [id number] Visit Date: 06/06/2022  Today's healthcare provider: [name], [name]  Introduced to nurse practitioner role and practice setting.  All questions answered.  Discussed provider/patient relationship and expectations.  I,Patti Shorb J Cinque Begley,acting as a scribe for [name], [name].,have documented all relevant documentation on the behalf of [name], [name],as directed by  [name], [name] while in the presence of [name], [name].   Chief Complaint  Patient presents with   Diabetes   Hypertension   Hyperlipidemia   Subjective    HPI  Diabetes Mellitus Type II, follow-up  Lab Results  Component Value Date   HGBA1C 7.4 (A) 06/06/2022   HGBA1C 7.0 (A) 03/13/2022   HGBA1C 6.1 (A) 12/06/2021   Last seen for diabetes 3 months ago.  Management since then includes continuing the same treatment. She reports excellent compliance with treatment. She is not having side effects.   Home blood sugar records:  not checked  Episodes of hypoglycemia? No   Most Recent Eye Exam: is due   --------------------------------------------------------------------------------------------------- Hypertension, follow-up  BP Readings from Last 3 Encounters:  06/06/22 135/74  05/24/22 131/76  05/03/22 (!) 145/66   Wt Readings from Last 3 Encounters:  06/06/22 180 lb 9.6 oz (81.9 kg)  05/24/22 185 lb (83.9 kg)  05/03/22 181 lb 10.5 oz (82.4 kg)     She was last seen for hypertension 3 months ago.  BP at that visit was 146/81. Management since that visit includes Add Norvasc 5 mg to current medications, Coreg 25 mg BID, Lasix 40 mg QD PRN, HCTZ 25 mg QD, Lisinopril 40 mg, QD She reports excellent compliance with treatment. She is not having side effects.  She is not exercising. She is adherent to low salt diet.   Outside blood pressures are checked sometimes.  She does  not smoke.  Use of agents associated with hypertension: none.   --------------------------------------------------------------------------------------------------- Lipid/Cholesterol, follow-up  Last Lipid Panel: Lab Results  Component Value Date   CHOL 183 07/14/2021   LDLCALC 120 (H) 07/14/2021   HDL 38 (L) 07/14/2021   TRIG 142 07/14/2021    She was last seen for this 3 months ago.  Management since that visit includes continue medication.  She reports excellent compliance with treatment. She is not having side effects.   Symptoms: No appetite changes No foot ulcerations  No chest pain No chest pressure/discomfort  Yes dyspnea No orthopnea  No fatigue Yes lower extremity edema  Yes palpitations Yes paroxysmal nocturnal dyspnea  No nausea Yes numbness or tingling of extremity  No polydipsia No polyuria  No speech difficulty No syncope   She is following a Low fat diet. Current exercise: none  Last metabolic panel Lab Results  Component Value Date   GLUCOSE 227 (H) 05/24/2022   NA 139 05/24/2022   K 3.5 05/24/2022   BUN 18 05/24/2022   CREATININE 0.60 05/24/2022   EGFR 102 07/14/2021   GFRNONAA >60 05/24/2022   CALCIUM 8.8 (L) 05/24/2022   AST 29 05/24/2022   ALT 28 05/24/2022   The 10-year ASCVD risk score (Arnett DK, et al., 2019) is: 13.1%  ---------------------------------------------------------------------------------------------------  Hypothyroid, follow-up  Lab Results  Component Value Date   TSH 2.540 07/14/2021   TSH 3.030 10/14/2020   TSH 2.790 08/14/2019   T4TOTAL 12.7 (H) 08/14/2019   T4TOTAL 10.8 10/03/2018  Wt Readings from Last 3 Encounters:  06/06/22 180 lb 9.6 oz (81.9 kg)  05/24/22 185 lb (83.9 kg)  05/03/22 181 lb 10.5 oz (82.4 kg)    She was last seen for hypothyroid 3 months ago.  Management since that visit includes continue medications. She reports excellent compliance with treatment. She is not having side effects.    Symptoms: No change in energy level No constipation  Yes diarrhea No heat / cold intolerance  No nervousness Yes palpitations  No weight changes    -----------------------------------------------------------------------------------------   Medications: Outpatient Medications Prior to Visit  Medication Sig   amLODipine (NORVASC) 5 MG tablet Take 1 tablet (5 mg total) by mouth daily.   Ascorbic Acid (VITAMIN C PO) Take 1 tablet by mouth daily.   aspirin 81 MG EC tablet Take 81 mg by mouth at bedtime.   carvedilol (COREG) 25 MG tablet Take 1 tablet by mouth twice daily   Cholecalciferol (VITAMIN D3) 1000 UNITS CAPS Take 1,000 Units by mouth in the morning.   diphenoxylate-atropine (LOMOTIL) 2.5-0.025 MG tablet Take 2 tablets by mouth 4 (four) times daily as needed for diarrhea or loose stools.   Ferrous Sulfate (IRON) 325 (65 Fe) MG TABS Take 1 tablet by mouth once daily with breakfast   furosemide (LASIX) 40 MG tablet TAKE 1 TABLET BY MOUTH ONCE DAILY AS NEEDED FOR FLUID OR EDEMA   glucose blood (CONTOUR NEXT TEST) test strip Test fasting sugar each morning. Recheck if having hypoglycemic symptoms.   glyBURIDE (DIABETA) 5 MG tablet TAKE 1 TABLET BY MOUTH TWICE DAILY WITH MEALS.   hydrochlorothiazide (HYDRODIURIL) 25 MG tablet Take 1 tablet (25 mg total) by mouth daily.   Multiple Vitamin (MULTIVITAMIN) tablet Take 1 tablet by mouth every morning.   mupirocin ointment (BACTROBAN) 2 % Apply 1 application topically 2 (two) times daily.   zinc gluconate 50 MG tablet Take 50 mg by mouth daily.   [DISCONTINUED] levothyroxine (EUTHYROX) 75 MCG tablet TAKE 1 TABLET BY MOUTH ONCE DAILY BEFORE BREAKFAST .   [DISCONTINUED] lidocaine-prilocaine (EMLA) cream Apply to affected area once   [DISCONTINUED] metFORMIN (GLUCOPHAGE) 1000 MG tablet Take 1 tablet (1,000 mg total) by mouth 2 (two) times daily with a meal.   [DISCONTINUED] ondansetron (ZOFRAN) 8 MG tablet Take 1 tablet (8 mg total) by mouth 2  (two) times daily as needed for refractory nausea / vomiting.   [DISCONTINUED] PARoxetine (PAXIL) 20 MG tablet Take 1 tablet by mouth once daily   [DISCONTINUED] lisinopril (ZESTRIL) 40 MG tablet Take 1 tablet (40 mg total) by mouth daily.   No facility-administered medications prior to visit.    Review of Systems     Objective    BP 135/74 (BP Location: Right Arm, Patient Position: Sitting, Cuff Size: Normal)   Pulse (!) 58   Temp 98.5 F (36.9 C) (Oral)   Resp 16   Ht 5\' 4"  (1.626 m)   Wt 180 lb 9.6 oz (81.9 kg)   SpO2 98%   BMI 31.00 kg/m    Physical Exam Vitals and nursing note reviewed.  Constitutional:      General: She is not in acute distress.    Appearance: Normal appearance. She is obese. She is not ill-appearing, toxic-appearing or diaphoretic.  HENT:     Head: Normocephalic and atraumatic.  Cardiovascular:     Rate and Rhythm: Normal rate and regular rhythm.     Pulses: Normal pulses.     Heart sounds: No murmur heard.  Friction rub present. No gallop.  Pulmonary:     Effort: Pulmonary effort is normal. No respiratory distress.     Breath sounds: Normal breath sounds. No stridor. No wheezing, rhonchi or rales.  Chest:     Chest wall: No tenderness.  Abdominal:     General: Bowel sounds are normal.     Palpations: Abdomen is soft.  Musculoskeletal:        General: No swelling, tenderness, deformity or signs of injury. Normal range of motion.     Right lower leg: No edema.     Left lower leg: No edema.  Skin:    General: Skin is warm and dry.     Capillary Refill: Capillary refill takes less than 2 seconds.     Coloration: Skin is not jaundiced or pale.     Findings: No bruising, erythema, lesion or rash.  Neurological:     General: No focal deficit present.     Mental Status: She is alert and oriented to person, place, and time. Mental status is at baseline.     Cranial Nerves: No cranial nerve deficit.     Sensory: No sensory deficit.     Motor:  No weakness.     Coordination: Coordination normal.  Psychiatric:        Mood and Affect: Mood normal.        Behavior: Behavior normal.        Thought Content: Thought content normal.        Judgment: Judgment normal.      Results for orders placed or performed in visit on 06/06/22  POCT glycosylated hemoglobin (Hb A1C)  Result Value Ref Range   Hemoglobin A1C 7.4 (A) 4.0 - 5.6 %   Est. average glucose Bld gHb Est-mCnc 166     Assessment & Plan     Problem List Items Addressed This Visit       Cardiovascular and Mediastinum   Hypertension associated with diabetes ([place])    Chronic, stable BP at goal of 135/74 Continue Norvasc $RemoveBeforeDE'5mg'sMTPotSHxyYujPL$  QD Continue Coreg 25 mg BID Continue 40 mg Lasix PRN Continue 25 mg HCTZ QD Continue lisinopril 40 mg QD Reports trace of LE edema; not present today      Relevant Medications   lisinopril (ZESTRIL) 40 MG tablet   metFORMIN (GLUCOPHAGE) 1000 MG tablet     Endocrine   RESOLVED: Diabetic peripheral neuropathy associated with type 2 diabetes mellitus (HCC)   Relevant Medications   lisinopril (ZESTRIL) 40 MG tablet   metFORMIN (GLUCOPHAGE) 1000 MG tablet   PARoxetine (PAXIL) 20 MG tablet   Type 2 diabetes mellitus with hyperglycemia, without long-term current use of insulin (HCC) - Primary    Chronic, stable Due for DM eye exam; encouraged to scheduled Urine micro UTD Due for FLP; LDL goal of <100 Continue Metformin 1000 mg BID Continue glyburdide 5 mg BID       Relevant Medications   lisinopril (ZESTRIL) 40 MG tablet   metFORMIN (GLUCOPHAGE) 1000 MG tablet   Other Relevant Orders   POCT glycosylated hemoglobin (Hb A1C) (Completed)     Other   Anxiety, generalized    Chronic, stable Continue use of paxil 20 mg Appears in good spirits; happy to have some hair growth       Relevant Medications   PARoxetine (PAXIL) 20 MG tablet   Colon cancer screening    Wishes to do colo guard at this time       Relevant Orders  Cologuard    Elevated LDL cholesterol level    Due for FLP Not on statin at this time Hx of HLD with DM I recommend diet low in saturated fat and regular exercise - 30 min at least 5 times per week Pt is currently limited by fatigue d/t cancer tx      Relevant Orders   Lipid panel     No follow-ups on file.      Vonna Kotyk, [name], have reviewed all documentation for this visit. The documentation on 06/06/22 for the exam, diagnosis, procedures, and orders are all accurate and complete.    [name], [place] [phone] (phone) [phone] (fax)  [place]

## 2022-06-03 ENCOUNTER — Other Ambulatory Visit: Payer: Self-pay | Admitting: Family Medicine

## 2022-06-04 NOTE — Telephone Encounter (Signed)
Requested medication (s) are due for refill today:yes  Requested medication (s) are on the active medication list: {expired  Last refill:  10/26/21 #100 1 RF  Future visit scheduled: yes  Notes to clinic:  prescription expired 04/12/22   Requested Prescriptions  Pending Prescriptions Disp Refills   carvedilol (COREG) 25 MG tablet [Pharmacy Med Name: Carvedilol 25 MG Oral Tablet] 180 tablet 0    Sig: Take 1 tablet by mouth twice daily     Cardiovascular: Beta Blockers 3 Passed - 06/03/2022  5:04 PM      Passed - Cr in normal range and within 360 days    Creatinine, Ser  Date Value Ref Range Status  05/24/2022 0.60 0.44 - 1.00 mg/dL Final   Creatinine, POC  Date Value Ref Range Status  02/24/2016 NA mg/dL Final         Passed - AST in normal range and within 360 days    AST  Date Value Ref Range Status  05/24/2022 29 15 - 41 U/L Final         Passed - ALT in normal range and within 360 days    ALT  Date Value Ref Range Status  05/24/2022 28 0 - 44 U/L Final         Passed - Last BP in normal range    BP Readings from Last 1 Encounters:  05/24/22 131/76         Passed - Last Heart Rate in normal range    Pulse Readings from Last 1 Encounters:  05/24/22 61         Passed - Valid encounter within last 6 months    Recent Outpatient Visits           2 months ago Type 2 diabetes mellitus with hyperglycemia, without long-term current use of insulin [facility])   [facility] Tally Joe T, [name]   6 months ago Type 2 diabetes mellitus without complication, without long-term current use of insulin [facility])   [facility] [name], [name]   10 months ago Essential (primary) hypertension   [employer], Vickki Muff, PA-C   1 year ago Diabetic peripheral neuropathy associated with type 2 diabetes mellitus [facility])   [place], Vickki Muff, PA-C   1 year ago Essential (primary) hypertension   [employer], Vickki Muff, PA-C       Future Appointments             In 2 days [name], [place], [place]

## 2022-06-06 ENCOUNTER — Encounter: Payer: Self-pay | Admitting: Family Medicine

## 2022-06-06 ENCOUNTER — Ambulatory Visit (INDEPENDENT_AMBULATORY_CARE_PROVIDER_SITE_OTHER): Payer: 59 | Admitting: Family Medicine

## 2022-06-06 VITALS — BP 135/74 | HR 58 | Temp 98.5°F | Resp 16 | Ht 64.0 in | Wt 180.6 lb

## 2022-06-06 DIAGNOSIS — E1159 Type 2 diabetes mellitus with other circulatory complications: Secondary | ICD-10-CM | POA: Diagnosis not present

## 2022-06-06 DIAGNOSIS — E1165 Type 2 diabetes mellitus with hyperglycemia: Secondary | ICD-10-CM | POA: Diagnosis not present

## 2022-06-06 DIAGNOSIS — F411 Generalized anxiety disorder: Secondary | ICD-10-CM

## 2022-06-06 DIAGNOSIS — E1142 Type 2 diabetes mellitus with diabetic polyneuropathy: Secondary | ICD-10-CM

## 2022-06-06 DIAGNOSIS — I152 Hypertension secondary to endocrine disorders: Secondary | ICD-10-CM | POA: Diagnosis not present

## 2022-06-06 DIAGNOSIS — E7800 Pure hypercholesterolemia, unspecified: Secondary | ICD-10-CM | POA: Insufficient documentation

## 2022-06-06 DIAGNOSIS — R69 Illness, unspecified: Secondary | ICD-10-CM | POA: Diagnosis not present

## 2022-06-06 DIAGNOSIS — Z1211 Encounter for screening for malignant neoplasm of colon: Secondary | ICD-10-CM | POA: Insufficient documentation

## 2022-06-06 LAB — POCT GLYCOSYLATED HEMOGLOBIN (HGB A1C)
Est. average glucose Bld gHb Est-mCnc: 166
Hemoglobin A1C: 7.4 % — AB (ref 4.0–5.6)

## 2022-06-06 MED ORDER — PAROXETINE HCL 20 MG PO TABS: 20.0000 mg | ORAL_TABLET | Freq: Every day | ORAL | 3 refills | Status: DC

## 2022-06-06 MED ORDER — LEVOTHYROXINE SODIUM 75 MCG PO TABS: ORAL_TABLET | ORAL | 1 refills | Status: DC

## 2022-06-06 MED ORDER — METFORMIN HCL 1000 MG PO TABS: 1000.0000 mg | ORAL_TABLET | Freq: Two times a day (BID) | ORAL | 1 refills | Status: DC

## 2022-06-06 MED ORDER — LISINOPRIL 40 MG PO TABS: 40.0000 mg | ORAL_TABLET | Freq: Every day | ORAL | 1 refills | Status: DC

## 2022-06-06 NOTE — Assessment & Plan Note (Signed)
Chronic, stable Due for DM eye exam; encouraged to scheduled Urine micro UTD Due for FLP; LDL goal of <100 Continue Metformin 1000 mg BID Continue glyburdide 5 mg BID

## 2022-06-06 NOTE — Assessment & Plan Note (Signed)
Chronic, stable BP at goal of 135/74 Continue Norvasc '5mg'$  QD Continue Coreg 25 mg BID Continue 40 mg Lasix PRN Continue 25 mg HCTZ QD Continue lisinopril 40 mg QD Reports trace of LE edema; not present today

## 2022-06-06 NOTE — Assessment & Plan Note (Signed)
Wishes to do colo guard at this time

## 2022-06-06 NOTE — Assessment & Plan Note (Signed)
Chronic, stable Continue use of paxil 20 mg Appears in good spirits; happy to have some hair growth

## 2022-06-06 NOTE — Assessment & Plan Note (Signed)
Due for FLP Not on statin at this time Hx of HLD with DM I recommend diet low in saturated fat and regular exercise - 30 min at least 5 times per week Pt is currently limited by fatigue d/t cancer tx

## 2022-06-14 ENCOUNTER — Inpatient Hospital Stay: Payer: 59

## 2022-06-14 VITALS — BP 105/58 | HR 64 | Temp 96.3°F | Resp 16 | Wt 181.7 lb

## 2022-06-14 DIAGNOSIS — Z818 Family history of other mental and behavioral disorders: Secondary | ICD-10-CM | POA: Diagnosis not present

## 2022-06-14 DIAGNOSIS — R109 Unspecified abdominal pain: Secondary | ICD-10-CM | POA: Diagnosis not present

## 2022-06-14 DIAGNOSIS — C50812 Malignant neoplasm of overlapping sites of left female breast: Secondary | ICD-10-CM | POA: Diagnosis not present

## 2022-06-14 DIAGNOSIS — I10 Essential (primary) hypertension: Secondary | ICD-10-CM | POA: Diagnosis not present

## 2022-06-14 DIAGNOSIS — Z9049 Acquired absence of other specified parts of digestive tract: Secondary | ICD-10-CM | POA: Diagnosis not present

## 2022-06-14 DIAGNOSIS — Z79899 Other long term (current) drug therapy: Secondary | ICD-10-CM | POA: Diagnosis not present

## 2022-06-14 DIAGNOSIS — E119 Type 2 diabetes mellitus without complications: Secondary | ICD-10-CM | POA: Diagnosis not present

## 2022-06-14 DIAGNOSIS — D696 Thrombocytopenia, unspecified: Secondary | ICD-10-CM | POA: Diagnosis not present

## 2022-06-14 DIAGNOSIS — Z79890 Hormone replacement therapy (postmenopausal): Secondary | ICD-10-CM | POA: Diagnosis not present

## 2022-06-14 DIAGNOSIS — Z87891 Personal history of nicotine dependence: Secondary | ICD-10-CM | POA: Diagnosis not present

## 2022-06-14 DIAGNOSIS — R197 Diarrhea, unspecified: Secondary | ICD-10-CM | POA: Diagnosis not present

## 2022-06-14 DIAGNOSIS — Z8042 Family history of malignant neoplasm of prostate: Secondary | ICD-10-CM | POA: Diagnosis not present

## 2022-06-14 DIAGNOSIS — Z8249 Family history of ischemic heart disease and other diseases of the circulatory system: Secondary | ICD-10-CM | POA: Diagnosis not present

## 2022-06-14 DIAGNOSIS — E079 Disorder of thyroid, unspecified: Secondary | ICD-10-CM | POA: Diagnosis not present

## 2022-06-14 DIAGNOSIS — R69 Illness, unspecified: Secondary | ICD-10-CM | POA: Diagnosis not present

## 2022-06-14 DIAGNOSIS — Z833 Family history of diabetes mellitus: Secondary | ICD-10-CM | POA: Diagnosis not present

## 2022-06-14 DIAGNOSIS — Z803 Family history of malignant neoplasm of breast: Secondary | ICD-10-CM | POA: Diagnosis not present

## 2022-06-14 DIAGNOSIS — Z8349 Family history of other endocrine, nutritional and metabolic diseases: Secondary | ICD-10-CM | POA: Diagnosis not present

## 2022-06-14 DIAGNOSIS — Z5112 Encounter for antineoplastic immunotherapy: Secondary | ICD-10-CM | POA: Diagnosis not present

## 2022-06-14 DIAGNOSIS — I350 Nonrheumatic aortic (valve) stenosis: Secondary | ICD-10-CM | POA: Diagnosis not present

## 2022-06-14 DIAGNOSIS — K921 Melena: Secondary | ICD-10-CM | POA: Diagnosis not present

## 2022-06-14 DIAGNOSIS — Z170 Estrogen receptor positive status [ER+]: Secondary | ICD-10-CM

## 2022-06-14 LAB — CBC WITH DIFFERENTIAL/PLATELET
Abs Immature Granulocytes: 0.02 10*3/uL (ref 0.00–0.07)
Basophils Absolute: 0 10*3/uL (ref 0.0–0.1)
Basophils Relative: 1 %
Eosinophils Absolute: 0.2 10*3/uL (ref 0.0–0.5)
Eosinophils Relative: 3 %
HCT: 36.1 % (ref 36.0–46.0)
Hemoglobin: 11.3 g/dL — ABNORMAL LOW (ref 12.0–15.0)
Immature Granulocytes: 0 %
Lymphocytes Relative: 28 %
Lymphs Abs: 1.6 10*3/uL (ref 0.7–4.0)
MCH: 26 pg (ref 26.0–34.0)
MCHC: 31.3 g/dL (ref 30.0–36.0)
MCV: 83.2 fL (ref 80.0–100.0)
Monocytes Absolute: 0.2 10*3/uL (ref 0.1–1.0)
Monocytes Relative: 4 %
Neutro Abs: 3.7 10*3/uL (ref 1.7–7.7)
Neutrophils Relative %: 64 %
Platelets: 87 10*3/uL — ABNORMAL LOW (ref 150–400)
RBC: 4.34 MIL/uL (ref 3.87–5.11)
RDW: 15.4 % (ref 11.5–15.5)
WBC: 5.7 10*3/uL (ref 4.0–10.5)
nRBC: 0 % (ref 0.0–0.2)

## 2022-06-14 LAB — IRON AND TIBC
Iron: 40 ug/dL (ref 28–170)
Saturation Ratios: 12 % (ref 10.4–31.8)
TIBC: 340 ug/dL (ref 250–450)
UIBC: 300 ug/dL

## 2022-06-14 LAB — COMPREHENSIVE METABOLIC PANEL
ALT: 23 U/L (ref 0–44)
AST: 23 U/L (ref 15–41)
Albumin: 4 g/dL (ref 3.5–5.0)
Alkaline Phosphatase: 55 U/L (ref 38–126)
Anion gap: 9 (ref 5–15)
BUN: 22 mg/dL (ref 8–23)
CO2: 26 mmol/L (ref 22–32)
Calcium: 8.7 mg/dL — ABNORMAL LOW (ref 8.9–10.3)
Chloride: 106 mmol/L (ref 98–111)
Creatinine, Ser: 0.88 mg/dL (ref 0.44–1.00)
GFR, Estimated: >60 mL/min (ref >60)
Glucose, Bld: 224 mg/dL — ABNORMAL HIGH (ref 70–99)
Potassium: 3.8 mmol/L (ref 3.5–5.1)
Sodium: 141 mmol/L (ref 135–145)
Total Bilirubin: 0.5 mg/dL (ref 0.3–1.2)
Total Protein: 6.8 g/dL (ref 6.5–8.1)

## 2022-06-14 LAB — MAGNESIUM: Magnesium: 1.7 mg/dL (ref 1.7–2.4)

## 2022-06-14 LAB — FERRITIN: Ferritin: 54 ng/mL (ref 11–307)

## 2022-06-14 MED ORDER — ACETAMINOPHEN 325 MG PO TABS
650.0000 mg | ORAL_TABLET | Freq: Once | ORAL | Status: AC
  Administered 2022-06-14: 650 mg via ORAL
  Filled 2022-06-14: qty 2

## 2022-06-14 MED ORDER — SODIUM CHLORIDE 0.9 % IV SOLN
Freq: Once | INTRAVENOUS | Status: AC
  Filled 2022-06-14: qty 250

## 2022-06-14 MED ORDER — SODIUM CHLORIDE 0.9 % IV SOLN
420.0000 mg | Freq: Once | INTRAVENOUS | Status: AC
  Administered 2022-06-14: 420 mg via INTRAVENOUS
  Filled 2022-06-14: qty 14

## 2022-06-14 MED ORDER — DIPHENHYDRAMINE HCL 25 MG PO CAPS
25.0000 mg | ORAL_CAPSULE | Freq: Once | ORAL | Status: AC
  Administered 2022-06-14: 25 mg via ORAL
  Filled 2022-06-14: qty 1

## 2022-06-14 MED ORDER — HEPARIN SOD (PORK) LOCK FLUSH 100 UNIT/ML IV SOLN
500.0000 [IU] | Freq: Once | INTRAVENOUS | Status: AC | PRN
  Administered 2022-06-14: 500 [IU]
  Filled 2022-06-14: qty 5

## 2022-06-14 MED ORDER — TRASTUZUMAB-DKST CHEMO 150 MG IV SOLR
6.0000 mg/kg | Freq: Once | INTRAVENOUS | Status: AC
  Administered 2022-06-14: 504 mg via INTRAVENOUS
  Filled 2022-06-14: qty 24

## 2022-06-14 NOTE — Patient Instructions (Signed)
[facility] AT [place]  Discharge Instructions: Thank you for choosing [facility] to provide your oncology and hematology care.  If you have a lab appointment with the [facility], please go directly to the [facility] and check in at the registration area.  Wear comfortable clothing and clothing appropriate for easy access to any Portacath or PICC line.   We strive to give you quality time with your provider. You may need to reschedule your appointment if you arrive late (15 or more minutes).  Arriving late affects you and other patients whose appointments are after yours.  Also, if you miss three or more appointments without notifying the office, you may be dismissed from the clinic at the provider's discretion.      For prescription refill requests, have your pharmacy contact our office and allow 72 hours for refills to be completed.    Today you received the following chemotherapy and/or immunotherapy agents Ogivri & Perjeta      To help prevent nausea and vomiting after your treatment, we encourage you to take your nausea medication as directed.  BELOW ARE SYMPTOMS THAT SHOULD BE REPORTED IMMEDIATELY: *FEVER GREATER THAN 100.4 F (38 C) OR HIGHER *CHILLS OR SWEATING *NAUSEA AND VOMITING THAT IS NOT CONTROLLED WITH YOUR NAUSEA MEDICATION *UNUSUAL SHORTNESS OF BREATH *UNUSUAL BRUISING OR BLEEDING *URINARY PROBLEMS (pain or burning when urinating, or frequent urination) *BOWEL PROBLEMS (unusual diarrhea, constipation, pain near the anus) TENDERNESS IN MOUTH AND THROAT WITH OR WITHOUT PRESENCE OF ULCERS (sore throat, sores in mouth, or a toothache) UNUSUAL RASH, SWELLING OR PAIN  UNUSUAL VAGINAL DISCHARGE OR ITCHING   Items with * indicate a potential emergency and should be followed up as soon as possible or go to the Emergency Department if any problems should occur.  Please show the CHEMOTHERAPY ALERT CARD or IMMUNOTHERAPY ALERT CARD at  check-in to the Emergency Department and triage nurse.  Should you have questions after your visit or need to cancel or reschedule your appointment, please contact [facility] CANCER [place] AT [place]  [phone] and follow the prompts.  Office hours are 8:00 a.m. to 4:30 p.m. Monday - Friday. Please note that voicemails left after 4:00 p.m. may not be returned until the following business day.  We are closed weekends and major holidays. You have access to a nurse at all times for urgent questions. Please call the main number to the clinic [phone] and follow the prompts.  For any non-urgent questions, you may also contact your provider using MyChart. We now offer e-Visits for anyone 43 and older to request care online for non-urgent symptoms. For details visit [email or web address].   Also download the MyChart app! Go to the app store, search "MyChart", open the app, select [facility], and log in with your MyChart username and password.  Masks are optional in the cancer centers. If you would like for your care team to wear a mask while they are taking care of you, please let them know. For doctor visits, patients may have with them one support person who is at least 62 years old. At this time, visitors are not allowed in the infusion area.

## 2022-06-22 DIAGNOSIS — Z1211 Encounter for screening for malignant neoplasm of colon: Secondary | ICD-10-CM | POA: Diagnosis not present

## 2022-06-28 LAB — COLOGUARD: COLOGUARD: NEGATIVE

## 2022-06-29 NOTE — Progress Notes (Signed)
Negative cologuard. Repeat in 3 years. Please let us know if you have any questions.  Thank you, [name], [address] [place], [place] [address] [phone] (phone) [phone] (fax) [place]

## 2022-07-05 ENCOUNTER — Inpatient Hospital Stay: Payer: 59 | Attending: Oncology

## 2022-07-05 ENCOUNTER — Encounter: Payer: Self-pay | Admitting: Oncology

## 2022-07-05 ENCOUNTER — Inpatient Hospital Stay (HOSPITAL_BASED_OUTPATIENT_CLINIC_OR_DEPARTMENT_OTHER): Payer: 59 | Admitting: Oncology

## 2022-07-05 ENCOUNTER — Ambulatory Visit: Payer: 59 | Admitting: Oncology

## 2022-07-05 ENCOUNTER — Inpatient Hospital Stay: Payer: 59

## 2022-07-05 VITALS — BP 127/66 | HR 64 | Temp 97.4°F | Resp 16 | Ht 64.0 in | Wt 179.0 lb

## 2022-07-05 DIAGNOSIS — Z8349 Family history of other endocrine, nutritional and metabolic diseases: Secondary | ICD-10-CM | POA: Insufficient documentation

## 2022-07-05 DIAGNOSIS — C50812 Malignant neoplasm of overlapping sites of left female breast: Secondary | ICD-10-CM | POA: Diagnosis not present

## 2022-07-05 DIAGNOSIS — Z8249 Family history of ischemic heart disease and other diseases of the circulatory system: Secondary | ICD-10-CM | POA: Diagnosis not present

## 2022-07-05 DIAGNOSIS — S0990XA Unspecified injury of head, initial encounter: Secondary | ICD-10-CM | POA: Diagnosis not present

## 2022-07-05 DIAGNOSIS — D696 Thrombocytopenia, unspecified: Secondary | ICD-10-CM | POA: Diagnosis not present

## 2022-07-05 DIAGNOSIS — Z79890 Hormone replacement therapy (postmenopausal): Secondary | ICD-10-CM | POA: Diagnosis not present

## 2022-07-05 DIAGNOSIS — I10 Essential (primary) hypertension: Secondary | ICD-10-CM | POA: Insufficient documentation

## 2022-07-05 DIAGNOSIS — R197 Diarrhea, unspecified: Secondary | ICD-10-CM | POA: Diagnosis not present

## 2022-07-05 DIAGNOSIS — Z170 Estrogen receptor positive status [ER+]: Secondary | ICD-10-CM | POA: Insufficient documentation

## 2022-07-05 DIAGNOSIS — C50919 Malignant neoplasm of unspecified site of unspecified female breast: Secondary | ICD-10-CM | POA: Diagnosis not present

## 2022-07-05 DIAGNOSIS — Z833 Family history of diabetes mellitus: Secondary | ICD-10-CM | POA: Diagnosis not present

## 2022-07-05 DIAGNOSIS — Z87891 Personal history of nicotine dependence: Secondary | ICD-10-CM | POA: Diagnosis not present

## 2022-07-05 DIAGNOSIS — W19XXXA Unspecified fall, initial encounter: Secondary | ICD-10-CM

## 2022-07-05 DIAGNOSIS — S80811A Abrasion, right lower leg, initial encounter: Secondary | ICD-10-CM | POA: Diagnosis not present

## 2022-07-05 DIAGNOSIS — Z818 Family history of other mental and behavioral disorders: Secondary | ICD-10-CM | POA: Diagnosis not present

## 2022-07-05 DIAGNOSIS — W010XXA Fall on same level from slipping, tripping and stumbling without subsequent striking against object, initial encounter: Secondary | ICD-10-CM | POA: Diagnosis not present

## 2022-07-05 DIAGNOSIS — K921 Melena: Secondary | ICD-10-CM | POA: Insufficient documentation

## 2022-07-05 DIAGNOSIS — Z8042 Family history of malignant neoplasm of prostate: Secondary | ICD-10-CM | POA: Diagnosis not present

## 2022-07-05 DIAGNOSIS — R5383 Other fatigue: Secondary | ICD-10-CM | POA: Insufficient documentation

## 2022-07-05 DIAGNOSIS — E119 Type 2 diabetes mellitus without complications: Secondary | ICD-10-CM | POA: Insufficient documentation

## 2022-07-05 DIAGNOSIS — Z9049 Acquired absence of other specified parts of digestive tract: Secondary | ICD-10-CM | POA: Diagnosis not present

## 2022-07-05 DIAGNOSIS — I350 Nonrheumatic aortic (valve) stenosis: Secondary | ICD-10-CM | POA: Insufficient documentation

## 2022-07-05 DIAGNOSIS — Z79899 Other long term (current) drug therapy: Secondary | ICD-10-CM | POA: Insufficient documentation

## 2022-07-05 DIAGNOSIS — Z803 Family history of malignant neoplasm of breast: Secondary | ICD-10-CM | POA: Insufficient documentation

## 2022-07-05 DIAGNOSIS — Z5112 Encounter for antineoplastic immunotherapy: Secondary | ICD-10-CM | POA: Diagnosis not present

## 2022-07-05 LAB — CBC WITH DIFFERENTIAL/PLATELET
Abs Immature Granulocytes: 0.04 10*3/uL (ref 0.00–0.07)
Basophils Absolute: 0 10*3/uL (ref 0.0–0.1)
Basophils Relative: 1 %
Eosinophils Absolute: 0.2 10*3/uL (ref 0.0–0.5)
Eosinophils Relative: 2 %
HCT: 37.2 % (ref 36.0–46.0)
Hemoglobin: 11.8 g/dL — ABNORMAL LOW (ref 12.0–15.0)
Immature Granulocytes: 1 %
Lymphocytes Relative: 30 %
Lymphs Abs: 2.1 10*3/uL (ref 0.7–4.0)
MCH: 25.8 pg — ABNORMAL LOW (ref 26.0–34.0)
MCHC: 31.7 g/dL (ref 30.0–36.0)
MCV: 81.4 fL (ref 80.0–100.0)
Monocytes Absolute: 0.3 10*3/uL (ref 0.1–1.0)
Monocytes Relative: 4 %
Neutro Abs: 4.3 10*3/uL (ref 1.7–7.7)
Neutrophils Relative %: 62 %
Platelets: 108 10*3/uL — ABNORMAL LOW (ref 150–400)
RBC: 4.57 MIL/uL (ref 3.87–5.11)
RDW: 14.8 % (ref 11.5–15.5)
WBC: 6.9 10*3/uL (ref 4.0–10.5)
nRBC: 0 % (ref 0.0–0.2)

## 2022-07-05 LAB — COMPREHENSIVE METABOLIC PANEL
ALT: 26 U/L (ref 0–44)
AST: 24 U/L (ref 15–41)
Albumin: 4.1 g/dL (ref 3.5–5.0)
Alkaline Phosphatase: 59 U/L (ref 38–126)
Anion gap: 10 (ref 5–15)
BUN: 31 mg/dL — ABNORMAL HIGH (ref 8–23)
CO2: 26 mmol/L (ref 22–32)
Calcium: 8.8 mg/dL — ABNORMAL LOW (ref 8.9–10.3)
Chloride: 102 mmol/L (ref 98–111)
Creatinine, Ser: 0.62 mg/dL (ref 0.44–1.00)
GFR, Estimated: >60 mL/min (ref >60)
Glucose, Bld: 217 mg/dL — ABNORMAL HIGH (ref 70–99)
Potassium: 3.4 mmol/L — ABNORMAL LOW (ref 3.5–5.1)
Sodium: 138 mmol/L (ref 135–145)
Total Bilirubin: 0.4 mg/dL (ref 0.3–1.2)
Total Protein: 7 g/dL (ref 6.5–8.1)

## 2022-07-05 LAB — MAGNESIUM: Magnesium: 1.5 mg/dL — ABNORMAL LOW (ref 1.7–2.4)

## 2022-07-05 MED ORDER — DIPHENHYDRAMINE HCL 25 MG PO CAPS
25.0000 mg | ORAL_CAPSULE | Freq: Once | ORAL | Status: AC
  Administered 2022-07-05: 25 mg via ORAL
  Filled 2022-07-05: qty 1

## 2022-07-05 MED ORDER — MAGNESIUM SULFATE 2 GM/50ML IV SOLN
2.0000 g | Freq: Once | INTRAVENOUS | Status: AC
  Administered 2022-07-05: 2 g via INTRAVENOUS
  Filled 2022-07-05: qty 50

## 2022-07-05 MED ORDER — POTASSIUM CHLORIDE CRYS ER 20 MEQ PO TBCR: 20.0000 meq | EXTENDED_RELEASE_TABLET | Freq: Two times a day (BID) | ORAL | 0 refills | Status: DC

## 2022-07-05 MED ORDER — SODIUM CHLORIDE 0.9 % IV SOLN
420.0000 mg | Freq: Once | INTRAVENOUS | Status: AC
  Administered 2022-07-05: 420 mg via INTRAVENOUS
  Filled 2022-07-05: qty 14

## 2022-07-05 MED ORDER — SODIUM CHLORIDE 0.9% FLUSH
10.0000 mL | Freq: Once | INTRAVENOUS | Status: AC
  Administered 2022-07-05: 10 mL via INTRAVENOUS
  Filled 2022-07-05: qty 10

## 2022-07-05 MED ORDER — HEPARIN SOD (PORK) LOCK FLUSH 100 UNIT/ML IV SOLN
500.0000 [IU] | Freq: Once | INTRAVENOUS | Status: AC
  Administered 2022-07-05: 500 [IU] via INTRAVENOUS
  Filled 2022-07-05: qty 5

## 2022-07-05 MED ORDER — SODIUM CHLORIDE 0.9 % IV SOLN
Freq: Once | INTRAVENOUS | Status: AC
  Filled 2022-07-05: qty 250

## 2022-07-05 MED ORDER — ACETAMINOPHEN 325 MG PO TABS
650.0000 mg | ORAL_TABLET | Freq: Once | ORAL | Status: AC
  Administered 2022-07-05: 650 mg via ORAL
  Filled 2022-07-05: qty 2

## 2022-07-05 MED ORDER — TRASTUZUMAB-DKST CHEMO 150 MG IV SOLR
6.0000 mg/kg | Freq: Once | INTRAVENOUS | Status: AC
  Administered 2022-07-05: 504 mg via INTRAVENOUS
  Filled 2022-07-05: qty 24

## 2022-07-05 NOTE — Progress Notes (Signed)
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    PMH- She underwent simple mastectomy on August 16, 2021 which not only revealed invasive carcinoma, but she also had 2 of 3 lymph nodes positive for disease.  Previously, right breast biopsy was negative for malignancy.  Given stage and HER2 status of disease, initial plan was to give adjuvant chemotherapy using Taxotere, carboplatinum, Perjeta, and Herceptin every 3 weeks for 6 cycles with Udenyca support and then maintenance Herceptin and Perjeta every 3 weeks for an entire year.  She will also benefit from letrozole for 5 years at the conclusion of all of her treatments.  Recent cardiac echo on February 07, 2022 revealed an EF of 60 to 65%. Patient has had port placement.  Patient only received 3 cycles of treatment, then carboplatinum and Taxotere were discontinued secondary to persistent thrombocytopenia.  Patient was subsequently transitioned to maintenance Perjeta and Herceptin.  Patient last received carboplatinum and Taxotere on December 22, 2021.   INTERVAL HISTORY: Emily Tapia is a 61 year old female who presents to clinic today for her next chemotherapy.  She continues to have diarrhea several episodes daily water consistency for almost a year up to 8 episodes each day.  Uses Imodium and Lomotil throughout the day with some relief.  States is interfering with daily activities.  Has a GI consultation on 07/11/2022.  Feels that the diarrhea started prior to chemotherapy and is an ongoing issue.   Has hemorrhoids that occasionally cause her to bleed.  Appetite is good.  Weight is stable.  Larey Seat over a baby gate yesterday evening and noted some bleeding from the left side of her head.  Held a bandage to side of head which stopped her bleeding after a few minutes.  Denies any neurological complaints such as dizziness, headaches or changes in her vision.  She also has an abrasion on her right shin from the fall.  REVIEW OF SYSTEMS:   Review of Systems  Constitutional:  Positive for malaise/fatigue.  Gastrointestinal:  Positive for blood in stool and diarrhea.  Musculoskeletal:  Positive for falls.  Skin:        Righ shin abrasion and left head injury   All other systems reviewed and are negative.   As per HPI. Otherwise, a complete review of systems is negative.  PAST MEDICAL HISTORY: Past Medical History:  Diagnosis Date   Anxiety    Aortic stenosis    Diabetes mellitus    Edema    Heart murmur    Hypertension    Sleep apnea    Thyroid disease     PAST SURGICAL HISTORY: Past Surgical History:  Procedure Laterality Date   ABDOMINAL HYSTERECTOMY  11/17/2013   BREAST BIOPSY     x2   BREAST BIOPSY Left 07/20/2021   [place] Bx, Ribbon Clip, Path pending   BREAST BIOPSY Left 07/20/2021   Stereo Bx, X-clip, path pending   BREAST BIOPSY Left 07/20/2021   Stereo biopsy, coil clip, path pending   BREAST BIOPSY Right 07/20/2021   Stereo Bx, Ribbon Clip, path pending   CESAREAN SECTION  x 2   CHOLECYSTECTOMY  12/17/2001   DUE TO STONES   DILATION AND CURETTAGE OF UTERUS     ELBOW SURGERY     POLYPECTOMY  12/17/1993   PORTACATH PLACEMENT Right 10/25/2021   Procedure: INSERTION PORT-A-CATH;  Surgeon: Ronny Bacon, MD;  Location: [facility] ORS;  Service: General;  Laterality: Right;   SIMPLE MASTECTOMY WITH AXILLARY SENTINEL NODE BIOPSY Left 08/16/2021   Procedure: SIMPLE MASTECTOMY WITH AXILLARY SENTINEL NODE BIOPSY;  Surgeon: Ronny Bacon, MD;  Location: [facility] ORS;  Service:  General;  Laterality: Left;    FAMILY HISTORY: Family History  Problem Relation Age of Onset   Hyperlipidemia Mother    Thyroid disease Mother    Hashimoto's thyroiditis Mother    Heart disease Father        open heart surgery   Heart failure Father    Diabetes Father    Dementia Father    Thyroid disease Brother    Breast cancer Maternal Grandmother    Diabetes Paternal Grandmother    Prostate cancer Paternal Grandfather     ADVANCED DIRECTIVES (Y/N):  N  HEALTH MAINTENANCE: Social History   Tobacco Use   Smoking status: Former    Packs/day: 1.50    Years: 25.00    Total pack years: 37.50    Types: Cigarettes    Quit date: 11/24/2001    Years since quitting: 20.6   Smokeless tobacco: Never  Vaping Use   Vaping Use: Never used  Substance Use Topics   Alcohol use: Yes    Alcohol/week: 0.0 standard drinks of alcohol    Comment: OCCASIONALLY DRINKS WINE, ONCE A WEEK   Drug use: No     Colonoscopy:  PAP:  Bone density:  Lipid panel:  Allergies  Allergen Reactions   Fosaprepitant Nausea Only and Other (See Comments)    Back pain, nausea. See progress note from 10/27/2021     Current Outpatient Medications  Medication Sig Dispense Refill   amLODipine (NORVASC) 5 MG tablet Take 1 tablet (5 mg total) by mouth daily. 90 tablet 1   Ascorbic Acid (VITAMIN C PO) Take 1 tablet by mouth daily.     aspirin 81 MG EC tablet Take 81 mg by mouth at bedtime.     carvedilol (COREG) 25 MG tablet Take 1 tablet by mouth twice daily 180 tablet 0   Cholecalciferol (VITAMIN D3) 1000 UNITS CAPS Take 1,000 Units by mouth in the morning.     diphenoxylate-atropine (LOMOTIL) 2.5-0.025 MG tablet Take 2 tablets by mouth 4 (four) times daily as needed for diarrhea or loose stools. 60 tablet 1   Ferrous Sulfate (IRON) 325 (65 Fe) MG TABS Take 1 tablet by mouth once daily with breakfast 90 tablet 0   furosemide (LASIX) 40 MG tablet TAKE 1 TABLET BY MOUTH ONCE DAILY AS NEEDED FOR FLUID OR  EDEMA 90 tablet 1   glucose blood (CONTOUR NEXT TEST) test strip Test fasting sugar each morning. Recheck if having hypoglycemic symptoms. 100 each 4   glyBURIDE (DIABETA) 5 MG tablet TAKE 1 TABLET BY MOUTH TWICE DAILY WITH MEALS. 180 tablet 3   hydrochlorothiazide (HYDRODIURIL) 25 MG tablet Take 1 tablet (25 mg total) by mouth daily. 90 tablet 3   levothyroxine (EUTHYROX) 75 MCG tablet TAKE 1 TABLET BY MOUTH ONCE DAILY BEFORE BREAKFAST . 90 tablet 1   lisinopril (ZESTRIL) 40 MG tablet Take 1 tablet (40 mg total) by mouth daily. 90 tablet 1   metFORMIN (GLUCOPHAGE) 1000 MG tablet Take  1 tablet (1,000 mg total) by mouth 2 (two) times daily with a meal. 180 tablet 1   Multiple Vitamin (MULTIVITAMIN) tablet Take 1 tablet by mouth every morning.     mupirocin ointment (BACTROBAN) 2 % Apply 1 application topically 2 (two) times daily. 22 g 0   PARoxetine (PAXIL) 20 MG tablet Take 1 tablet (20 mg total) by mouth daily. 90 tablet 3   potassium chloride SA (KLOR-CON M) 20 MEQ tablet Take 1 tablet (20 mEq total) by mouth 2 (two) times daily. 14 tablet 0   zinc gluconate 50 MG tablet Take 50 mg by mouth daily.     No current facility-administered medications for this visit.    OBJECTIVE: Vitals:   07/05/22 0859  BP: 127/66  Pulse: 64  Resp: 16  Temp: (!) 97.4 F (36.3 C)  SpO2: 97%      Body mass index is 30.73 kg/m.    ECOG FS:0 - Asymptomatic  Physical Exam Constitutional:      Appearance: Normal appearance.  HENT:     Head:   Cardiovascular:     Rate and Rhythm: Normal rate and regular rhythm.     Heart sounds: Murmur heard.  Pulmonary:     Effort: Pulmonary effort is normal.     Breath sounds: Normal breath sounds.  Abdominal:     General: Bowel sounds are normal.     Palpations: Abdomen is soft.  Musculoskeletal:        General: No swelling. Normal range of motion.  Skin:    Comments: Left shin with ecchymosis and erythema secondary to fall  Neurological:     Mental  Status: She is alert and oriented to person, place, and time. Mental status is at baseline.     LAB RESULTS:  Lab Results  Component Value Date   NA 138 07/05/2022   K 3.4 (L) 07/05/2022   CL 102 07/05/2022   CO2 26 07/05/2022   GLUCOSE 217 (H) 07/05/2022   BUN 31 (H) 07/05/2022   CREATININE 0.62 07/05/2022   CALCIUM 8.8 (L) 07/05/2022   PROT 7.0 07/05/2022   ALBUMIN 4.1 07/05/2022   AST 24 07/05/2022   ALT 26 07/05/2022   ALKPHOS 59 07/05/2022   BILITOT 0.4 07/05/2022   GFRNONAA >60 07/05/2022   GFRAA 111 10/14/2020    Lab Results  Component Value Date   WBC 6.9 07/05/2022   NEUTROABS 4.3 07/05/2022   HGB 11.8 (L) 07/05/2022   HCT 37.2 07/05/2022   MCV 81.4 07/05/2022   PLT 108 (L) 07/05/2022     STUDIES: No results found.  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    Breast cancer- Tolerating treatments well.  Proceed with next cycle of chemotherapy. Will need echocardiogram prior to next cycle.  Diarrhea- Continue to rotate Lomotil and Imodium as needed for diarrhea.  Has GI consultation next week.  Thrombocytopenia- Chronic but stable.  Platelet count 108,000.  Proceed with treatment.  Hypomagnesemia- Magnesium level 1.5 today.  Add on 2 g magnesium prior to treatment.  Fall-  No neurological concerns with assessment today.  Discussed if she develops any headaches, visual changes or pain she should be seen urgently.  Continue to keep areas clean and dry.    PLAN:  -Reviewed labs. -Proceed with next cycle of Perjeta and Kanjinti. -Repeat echocardiogram prior to next cycle -Continue antidiarrheals and follow-up with GI next week. -Return to clinic in 3 weeks for labs and treatment and in  6 weeks for labs, treatment and see Dr. Grayland Ormond.  I spent 25 minutes dedicated to the care of this patient (face-to-face and non-face-to-face) on the date of the encounter to include what is described in the assessment and  plan.   Patient expressed understanding and was in agreement with this plan. She also understands that She can call clinic at any time with any questions, concerns, or complaints.    Cancer Staging  Carcinoma of left breast [facility]) Staging form: Breast, AJCC 8th Edition - Pathologic stage from 08/30/2021: Stage IB (pT2, pN1a, cM0, G3, ER+, PR+, HER2+, Oncotype DX score: 18) - Signed by Lloyd Huger, MD on 09/13/2021 Stage prefix: Initial diagnosis Multigene prognostic tests performed: Oncotype DX Recurrence score range: Greater than or equal to 11 Histologic grading system: 3 grade system  Jacquelin Hawking, NP   07/05/2022 12:38 PM

## 2022-07-05 NOTE — Patient Instructions (Signed)
[facility]  Discharge Instructions: Thank you for choosing [facility] to provide your oncology and hematology care.  If you have a lab appointment with the Cancer Center, please go directly to the Cancer Center and check in at the registration area.  Wear comfortable clothing and clothing appropriate for easy access to any Portacath or PICC line.   We strive to give you quality time with your provider. You may need to reschedule your appointment if you arrive late (15 or more minutes).  Arriving late affects you and other patients whose appointments are after yours.  Also, if you miss three or more appointments without notifying the office, you may be dismissed from the clinic at the provider's discretion.      For prescription refill requests, have your pharmacy contact our office and allow 72 hours for refills to be completed.       To help prevent nausea and vomiting after your treatment, we encourage you to take your nausea medication as directed.  BELOW ARE SYMPTOMS THAT SHOULD BE REPORTED IMMEDIATELY: *FEVER GREATER THAN 100.4 F (38 C) OR HIGHER *CHILLS OR SWEATING *NAUSEA AND VOMITING THAT IS NOT CONTROLLED WITH YOUR NAUSEA MEDICATION *UNUSUAL SHORTNESS OF BREATH *UNUSUAL BRUISING OR BLEEDING *URINARY PROBLEMS (pain or burning when urinating, or frequent urination) *BOWEL PROBLEMS (unusual diarrhea, constipation, pain near the anus) TENDERNESS IN MOUTH AND THROAT WITH OR WITHOUT PRESENCE OF ULCERS (sore throat, sores in mouth, or a toothache) UNUSUAL RASH, SWELLING OR PAIN  UNUSUAL VAGINAL DISCHARGE OR ITCHING   Items with * indicate a potential emergency and should be followed up as soon as possible or go to the Emergency Department if any problems should occur.  Please show the CHEMOTHERAPY ALERT CARD or IMMUNOTHERAPY ALERT CARD at check-in to the Emergency Department and triage nurse.  Should you have questions after your  visit or need to cancel or reschedule your appointment, please contact [facility]  [phone] and follow the prompts.  Office hours are 8:00 a.m. to 4:30 p.m. Monday - Friday. Please note that voicemails left after 4:00 p.m. may not be returned until the following business day.  We are closed weekends and major holidays. You have access to a nurse at all times for urgent questions. Please call the main number to the clinic [phone] and follow the prompts.  For any non-urgent questions, you may also contact your provider using MyChart. We now offer e-Visits for anyone 18 and older to request care online for non-urgent symptoms. For details visit [email or web address].   Also download the MyChart app! Go to the app store, search "MyChart", open the app, select [facility], and log in with your MyChart username and password.  Masks are optional in the cancer centers. If you would like for your care team to wear a mask while they are taking care of you, please let them know. For doctor visits, patients may have with them one support person who is at least 62 years old. At this time, visitors are not allowed in the infusion area.   

## 2022-07-09 ENCOUNTER — Other Ambulatory Visit: Payer: Self-pay

## 2022-07-11 ENCOUNTER — Encounter: Payer: Self-pay | Admitting: Gastroenterology

## 2022-07-11 ENCOUNTER — Other Ambulatory Visit: Payer: Self-pay

## 2022-07-11 ENCOUNTER — Ambulatory Visit (INDEPENDENT_AMBULATORY_CARE_PROVIDER_SITE_OTHER): Payer: 59 | Admitting: Gastroenterology

## 2022-07-11 VITALS — BP 132/73 | HR 66 | Temp 98.3°F | Ht 64.0 in | Wt 182.5 lb

## 2022-07-11 DIAGNOSIS — R197 Diarrhea, unspecified: Secondary | ICD-10-CM

## 2022-07-11 DIAGNOSIS — R160 Hepatomegaly, not elsewhere classified: Secondary | ICD-10-CM | POA: Diagnosis not present

## 2022-07-11 DIAGNOSIS — K529 Noninfective gastroenteritis and colitis, unspecified: Secondary | ICD-10-CM | POA: Diagnosis not present

## 2022-07-11 DIAGNOSIS — R161 Splenomegaly, not elsewhere classified: Secondary | ICD-10-CM

## 2022-07-11 MED ORDER — NA SULFATE-K SULFATE-MG SULF 17.5-3.13-1.6 GM/177ML PO SOLN: 354.0000 mL | Freq: Once | ORAL | 0 refills | Status: AC

## 2022-07-11 NOTE — Progress Notes (Signed)
Cephas Darby, MD [address]  [place]  [place], [place] [address]  Main: [phone]  Fax: [phone]    Gastroenterology Consultation  Referring Provider:     Jacquelin Hawking, NP Primary Care Physician:  [name], [name] Primary Gastroenterologist:  Dr. Cephas Darby Reason for Consultation:     Chronic diarrhea        HPI:   Emily Tapia is a 61 y.o. female referred by Dr. [name], [place]  for consultation & management of stage Ib triple positive multifocal left breast cancer on chemotherapy anti-HER2 receptor antibody regimen Perjeta and Kanjinti.  Patient has been experiencing more than 1 year history of chronic nonbloody diarrhea which has been worsening over the last 6 months.  Her diarrheal episodes are mostly in the morning, runny watery, describes on Bristol stool scale as 5-7 and sometimes postprandial.  She denies any nocturnal episodes.  Previously she used to have postprandial urgency only.  It has gotten worse.  She denies any weight loss.  C. difficile was ruled out in 10/2021.  Patient is currently on Lomotil and Imodium as needed through her oncologist.  Patient is frustrated with ongoing diarrheal episodes and she cannot go out.  Patient is also noted to have chronic thrombocytopenia, splenomegaly, mildly elevated transaminases dating back to 2015 until 2019.  She had an MRI abdomen with and without contrast in 03/2017 which revealed diffuse hepatic steatosis, normal pancreas and splenomegaly.  She did not have any further imaging since then.  She also has history of mild normocytic anemia.  She has history of longstanding diabetes, currently on metformin, most recent hemoglobin A1c is 7.4, also with hypothyroidism.  Patient does not smoke or drink alcohol  NSAIDs: None  Antiplts/Anticoagulants/Anti thrombotics: None  GI Procedures: None  Past Medical History:  Diagnosis Date   Anxiety    Aortic stenosis    Diabetes mellitus     Edema    Heart murmur    Hypertension    Sleep apnea    Thyroid disease     Past Surgical History:  Procedure Laterality Date   ABDOMINAL HYSTERECTOMY  11/17/2013   BREAST BIOPSY     x2   BREAST BIOPSY Left 07/20/2021   [place] Bx, Ribbon Clip, Path pending   BREAST BIOPSY Left 07/20/2021   Stereo Bx, X-clip, path pending   BREAST BIOPSY Left 07/20/2021   Stereo biopsy, coil clip, path pending   BREAST BIOPSY Right 07/20/2021   Stereo Bx, Ribbon Clip, path pending   CESAREAN SECTION     x 2   CHOLECYSTECTOMY  12/17/2001   DUE TO STONES   DILATION AND CURETTAGE OF UTERUS     ELBOW SURGERY     POLYPECTOMY  12/17/1993   PORTACATH PLACEMENT Right 10/25/2021   Procedure: INSERTION PORT-A-CATH;  Surgeon: Ronny Bacon, MD;  Location: [facility] ORS;  Service: General;  Laterality: Right;   SIMPLE MASTECTOMY WITH AXILLARY SENTINEL NODE BIOPSY Left 08/16/2021   Procedure: SIMPLE MASTECTOMY WITH AXILLARY SENTINEL NODE BIOPSY;  Surgeon: Ronny Bacon, MD;  Location: [facility] OR[facility];  Service: General;  Laterality: Left;     Current Outpatient Medications:    amLODipine (NORVASC) 5 MG tablet, Take 1 tablet (5 mg total) by mouth daily., Disp: 90 tablet, Rfl: 1   Ascorbic Acid (VITAMIN C PO), Take 1 tablet by mouth daily., Disp: , Rfl:    aspirin 81 MG EC tablet, Take 81 mg by mouth at bedtime., Disp: ,  Rfl:    carvedilol (COREG) 25 MG tablet, Take 1 tablet by mouth twice daily, Disp: 180 tablet, Rfl: 0   Cholecalciferol (VITAMIN D3) 1000 UNITS CAPS, Take 1,000 Units by mouth in the morning., Disp: , Rfl:    diphenoxylate-atropine (LOMOTIL) 2.5-0.025 MG tablet, Take 2 tablets by mouth 4 (four) times daily as needed for diarrhea or loose stools., Disp: 60 tablet, Rfl: 1   Ferrous Sulfate (IRON) 325 (65 Fe) MG TABS, Take 1 tablet by mouth once daily with breakfast, Disp: 90 tablet, Rfl: 0   furosemide (LASIX) 40 MG tablet, TAKE 1 TABLET BY MOUTH ONCE DAILY AS NEEDED FOR FLUID OR EDEMA, Disp: 90 tablet,  Rfl: 1   glucose blood (CONTOUR NEXT TEST) test strip, Test fasting sugar each morning. Recheck if having hypoglycemic symptoms., Disp: 100 each, Rfl: 4   glyBURIDE (DIABETA) 5 MG tablet, TAKE 1 TABLET BY MOUTH TWICE DAILY WITH MEALS., Disp: 180 tablet, Rfl: 3   hydrochlorothiazide (HYDRODIURIL) 25 MG tablet, Take 1 tablet (25 mg total) by mouth daily., Disp: 90 tablet, Rfl: 3   levothyroxine (EUTHYROX) 75 MCG tablet, TAKE 1 TABLET BY MOUTH ONCE DAILY BEFORE BREAKFAST ., Disp: 90 tablet, Rfl: 1   lisinopril (ZESTRIL) 40 MG tablet, Take 1 tablet (40 mg total) by mouth daily., Disp: 90 tablet, Rfl: 1   metFORMIN (GLUCOPHAGE) 1000 MG tablet, Take 1 tablet (1,000 mg total) by mouth 2 (two) times daily with a meal., Disp: 180 tablet, Rfl: 1   Multiple Vitamin (MULTIVITAMIN) tablet, Take 1 tablet by mouth every morning., Disp: , Rfl:    mupirocin ointment (BACTROBAN) 2 %, Apply 1 application topically 2 (two) times daily., Disp: 22 g, Rfl: 0   PARoxetine (PAXIL) 20 MG tablet, Take 1 tablet (20 mg total) by mouth daily., Disp: 90 tablet, Rfl: 3   potassium chloride SA (KLOR-CON M) 20 MEQ tablet, Take 1 tablet (20 mEq total) by mouth 2 (two) times daily., Disp: 14 tablet, Rfl: 0   zinc gluconate 50 MG tablet, Take 50 mg by mouth daily., Disp: , Rfl:    Na Sulfate-K Sulfate-Mg Sulf 17.5-3.13-1.6 GM/177ML SOLN, Take 354 mLs by mouth once for 1 dose., Disp: 354 mL, Rfl: 0   Family History  Problem Relation Age of Onset   Hyperlipidemia Mother    Thyroid disease Mother    Hashimoto's thyroiditis Mother    Heart disease Father        open heart surgery   Heart failure Father    Diabetes Father    Dementia Father    Thyroid disease Brother    Breast cancer Maternal Grandmother    Diabetes Paternal Grandmother    Prostate cancer Paternal Grandfather      Social History   Tobacco Use   Smoking status: Former    Packs/day: 1.50    Years: 25.00    Total pack years: 37.50    Types: Cigarettes     Quit date: 11/24/2001    Years since quitting: 20.6   Smokeless tobacco: Never  Vaping Use   Vaping Use: Never used  Substance Use Topics   Alcohol use: Yes    Alcohol/week: 0.0 standard drinks of alcohol    Comment: OCCASIONALLY DRINKS WINE, ONCE A WEEK   Drug use: No    Allergies as of 07/11/2022 - Review Complete 07/11/2022  Allergen Reaction Noted   Fosaprepitant Nausea Only and Other (See Comments) 10/27/2021    Review of Systems:    All systems reviewed and  negative except where noted in HPI.   Physical Exam:  BP 132/73 (BP Location: Left Arm, Patient Position: Sitting, Cuff Size: Normal)   Pulse 66   Temp 98.3 F (36.8 C)   Ht $R'5\' 4"'kM$  (1.626 m)   Wt 182 lb 8 oz (82.8 kg)   BMI 31.33 kg/m  No LMP recorded. Patient has had a hysterectomy.  General:   Alert,  Well-developed, well-nourished, pleasant and cooperative in NAD Head:  Normocephalic and atraumatic. Eyes:  Sclera clear, no icterus.   Conjunctiva pink. Ears:  Normal auditory acuity. Nose:  No deformity, discharge, or lesions. Mouth:  No deformity or lesions,oropharynx pink & moist. Neck:  Supple; no masses or thyromegaly. Lungs:  Respirations even and unlabored.  Clear throughout to auscultation.   No wheezes, crackles, or rhonchi. No acute distress. Heart:  Regular rate and rhythm; no murmurs, clicks, rubs, or gallops. Abdomen:  Normal bowel sounds. Soft, moderately distended, hepatomegaly, right upper quadrant tenderness on the liver, palpable spleen, diastases recti, no guarding or rebound tenderness.   Rectal: Not performed Msk:  Symmetrical without gross deformities. Good, equal movement & strength bilaterally. Pulses:  Normal pulses noted. Extremities:  No clubbing or edema.  No cyanosis. Neurologic:  Alert and oriented x3;  grossly normal neurologically. Skin:  Intact without significant lesions or rashes. No jaundice. Psych:  Alert and cooperative. Normal mood and affect.  Imaging  Studies: Reviewed  Assessment and Plan:   Emily Tapia is a 61 y.o. female with metabolic syndrome, history of fatty liver, breast cancer, currently on chemotherapy is seen in consultation for chronic unexplained diarrhea  Chronic unexplained diarrhea Perjeta is associated with diarrhea in 45 to 67% of the cases Recommend GI profile PCR to rule out infection Recommend upper endoscopy and colonoscopy for further evaluation with gastric, duodenal biopsies, possible TI evaluation, random colon biopsies  Hepatomegaly, splenomegaly, chronic thrombocytopenia, fatty liver Recommend CT abdomen and pelvis with contrast   Follow up in 3 months   Cephas Darby, MD

## 2022-07-11 NOTE — Patient Instructions (Addendum)
CT abdominal 07/18/2022 at out patient imaging at 1:00pm. Please check in at 12:45pm at out patient imaging. Please pick up oral contrast before the scan. Address is [address], [place], [place] [address]. Phone number is [phone]

## 2022-07-12 ENCOUNTER — Other Ambulatory Visit: Payer: Self-pay

## 2022-07-13 ENCOUNTER — Telehealth: Payer: Self-pay | Admitting: Oncology

## 2022-07-13 NOTE — Telephone Encounter (Signed)
Patient called an left VM that she wants to reschedule her Aug. 10 infusion and lab appointment. Please advise

## 2022-07-16 ENCOUNTER — Ambulatory Visit
Admission: RE | Admit: 2022-07-16 | Discharge: 2022-07-16 | Disposition: A | Discharge status: Home / Self Care | Payer: 59 | Source: Ambulatory Visit | Attending: Oncology | Admitting: Oncology

## 2022-07-16 DIAGNOSIS — C50919 Malignant neoplasm of unspecified site of unspecified female breast: Secondary | ICD-10-CM | POA: Diagnosis not present

## 2022-07-16 DIAGNOSIS — E119 Type 2 diabetes mellitus without complications: Secondary | ICD-10-CM | POA: Insufficient documentation

## 2022-07-16 DIAGNOSIS — I082 Rheumatic disorders of both aortic and tricuspid valves: Secondary | ICD-10-CM | POA: Insufficient documentation

## 2022-07-16 DIAGNOSIS — E785 Hyperlipidemia, unspecified: Secondary | ICD-10-CM | POA: Diagnosis not present

## 2022-07-16 DIAGNOSIS — I10 Essential (primary) hypertension: Secondary | ICD-10-CM | POA: Diagnosis not present

## 2022-07-16 DIAGNOSIS — G4730 Sleep apnea, unspecified: Secondary | ICD-10-CM | POA: Insufficient documentation

## 2022-07-16 DIAGNOSIS — Z0189 Encounter for other specified special examinations: Secondary | ICD-10-CM | POA: Diagnosis not present

## 2022-07-16 DIAGNOSIS — Z87891 Personal history of nicotine dependence: Secondary | ICD-10-CM | POA: Insufficient documentation

## 2022-07-16 DIAGNOSIS — Z853 Personal history of malignant neoplasm of breast: Secondary | ICD-10-CM | POA: Diagnosis not present

## 2022-07-16 LAB — ECHOCARDIOGRAM COMPLETE
AR max vel: 0.61 cm2
AV Area VTI: 0.7 cm2
AV Area mean vel: 0.68 cm2
AV Mean grad: 25 mmHg
AV Peak grad: 43.4 mmHg
Ao pk vel: 3.3 m/s
Area-P 1/2: 3.93 cm2
S' Lateral: 2.31 cm

## 2022-07-18 ENCOUNTER — Ambulatory Visit
Admission: RE | Admit: 2022-07-18 | Discharge: 2022-07-18 | Disposition: A | Discharge status: Home / Self Care | Payer: 59 | Source: Ambulatory Visit | Attending: Gastroenterology | Admitting: Gastroenterology

## 2022-07-18 DIAGNOSIS — R161 Splenomegaly, not elsewhere classified: Secondary | ICD-10-CM | POA: Insufficient documentation

## 2022-07-18 DIAGNOSIS — R160 Hepatomegaly, not elsewhere classified: Secondary | ICD-10-CM | POA: Insufficient documentation

## 2022-07-18 DIAGNOSIS — R109 Unspecified abdominal pain: Secondary | ICD-10-CM | POA: Diagnosis not present

## 2022-07-18 DIAGNOSIS — R197 Diarrhea, unspecified: Secondary | ICD-10-CM | POA: Insufficient documentation

## 2022-07-18 MED ORDER — IOHEXOL 300 MG/ML  SOLN
100.0000 mL | Freq: Once | INTRAMUSCULAR | Status: AC | PRN
  Administered 2022-07-18: 100 mL via INTRAVENOUS

## 2022-07-19 ENCOUNTER — Telehealth: Payer: Self-pay

## 2022-07-19 NOTE — Telephone Encounter (Signed)
Informed patient of results and made appointment

## 2022-07-19 NOTE — Telephone Encounter (Signed)
-----   Message from Lin Landsman, MD sent at 07/19/2022  8:58 AM EDT ----- Please inform pt that the CT scan showed new diagnosis of cirrhosis which explains enlarged liver, spleen as well as low platelet count.  I would like to see her sooner than October, okay to overbook in the next 2 to 4 weeks.  Please send her information on low-sodium diet given new diagnosis of cirrhosis  RV

## 2022-07-23 ENCOUNTER — Other Ambulatory Visit: Payer: Self-pay | Admitting: Nurse Practitioner

## 2022-07-24 ENCOUNTER — Ambulatory Visit (INDEPENDENT_AMBULATORY_CARE_PROVIDER_SITE_OTHER): Payer: 59 | Admitting: Gastroenterology

## 2022-07-24 ENCOUNTER — Encounter: Payer: Self-pay | Admitting: Gastroenterology

## 2022-07-24 VITALS — BP 116/66 | HR 66 | Temp 98.4°F | Ht 64.0 in | Wt 183.0 lb

## 2022-07-24 DIAGNOSIS — K7460 Unspecified cirrhosis of liver: Secondary | ICD-10-CM

## 2022-07-24 NOTE — Progress Notes (Unsigned)
Emily Repress, MD [address]  [address]  [place], [place] [address]  Main: [phone]  Fax: [phone]    Gastroenterology Consultation  Referring Provider:     Jacky Kindle, [name] Primary Care Physician:  Jacky Kindle, [name] Primary Gastroenterologist:  Dr. Arlyss Tapia Reason for Consultation:    Cirrhosis of liver        HPI:   Emily Tapia is a 61 y.o. female referred by Dr. 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  Patient has been experiencing more than 1 year history of chronic nonbloody diarrhea which has been worsening over the last 6 months.  Her diarrheal episodes are mostly in the morning, runny watery, describes on Bristol stool scale as 5-7 and sometimes postprandial.  She denies any nocturnal episodes.  Previously she used to have postprandial urgency only.  It has gotten worse.  She denies any weight loss.  C. difficile was ruled out in 10/2021.  Patient is currently on Lomotil and Imodium as needed through her oncologist.  Patient is frustrated with ongoing diarrheal episodes and she cannot go out.  Patient is also noted to have chronic thrombocytopenia, splenomegaly, mildly elevated transaminases dating back to 2015 until 2019.  She had an MRI abdomen with and without contrast in 03/2017 which revealed diffuse hepatic steatosis, normal pancreas and splenomegaly.  She did not have any further imaging since then.  She also has history of mild normocytic anemia.  She has history of longstanding diabetes, currently on metformin, most recent hemoglobin A1c is 7.4, also with hypothyroidism.  Follow-up visit 07/25/2022 Patient is here to discuss about new diagnosis of cirrhosis of liver.  She underwent imaging for thrombocytopenia, splenomegaly and hepatomegaly as well as for history of postprandial abdominal pain and  diarrhea along with bloating, found to have cirrhosis of liver with mild splenomegaly and small amount of perihepatic ascites.  Patient does not smoke or drink alcohol  NSAIDs: None  Antiplts/Anticoagulants/Anti thrombotics: None  GI Procedures: None  Past Medical History:  Diagnosis Date   Anxiety    Aortic stenosis    Diabetes mellitus    Edema    Heart murmur    Hypertension    Sleep apnea    Thyroid disease     Past Surgical History:  Procedure Laterality Date   ABDOMINAL HYSTERECTOMY  11/17/2013   BREAST BIOPSY     x2   BREAST BIOPSY Left 07/20/2021   [place] Bx, Ribbon Clip, Path pending   BREAST BIOPSY Left 07/20/2021   Stereo Bx, X-clip, path pending   BREAST BIOPSY Left 07/20/2021   Stereo biopsy, coil clip, path pending   BREAST BIOPSY Right 07/20/2021   Stereo Bx, Ribbon Clip, path pending   CESAREAN SECTION     x 2   CHOLECYSTECTOMY  12/17/2001   DUE TO STONES   DILATION AND CURETTAGE OF UTERUS     ELBOW SURGERY     POLYPECTOMY  12/17/1993   PORTACATH PLACEMENT Right 10/25/2021   Procedure: INSERTION PORT-A-CATH;  Surgeon: Campbell Lerner, MD;  Location: [facility];  Service: General;  Laterality: Right;   SIMPLE MASTECTOMY WITH AXILLARY SENTINEL NODE BIOPSY Left 08/16/2021   Procedure: SIMPLE MASTECTOMY WITH AXILLARY SENTINEL NODE BIOPSY;  Surgeon: Campbell Lerner, MD;  Location: [facility];  Service: General;  Laterality: Left;     Current Outpatient Medications:  amLODipine (NORVASC) 5 MG tablet, Take 1 tablet (5 mg total) by mouth daily., Disp: 90 tablet, Rfl: 1   Ascorbic Acid (VITAMIN C PO), Take 1 tablet by mouth daily., Disp: , Rfl:    aspirin 81 MG EC tablet, Take 81 mg by mouth at bedtime., Disp: , Rfl:    carvedilol (COREG) 25 MG tablet, Take 1 tablet by mouth twice daily, Disp: 180 tablet, Rfl: 0   Cholecalciferol (VITAMIN D3) 1000 UNITS CAPS, Take 1,000 Units by mouth in the morning., Disp: , Rfl:    diphenoxylate-atropine (LOMOTIL) 2.5-0.025  MG tablet, Take 2 tablets by mouth 4 (four) times daily as needed for diarrhea or loose stools., Disp: 60 tablet, Rfl: 1   Ferrous Sulfate (IRON) 325 (65 Fe) MG TABS, Take 1 tablet by mouth once daily with breakfast, Disp: 90 tablet, Rfl: 0   furosemide (LASIX) 40 MG tablet, TAKE 1 TABLET BY MOUTH ONCE DAILY AS NEEDED FOR FLUID OR EDEMA, Disp: 90 tablet, Rfl: 1   glucose blood (CONTOUR NEXT TEST) test strip, Test fasting sugar each morning. Recheck if having hypoglycemic symptoms., Disp: 100 each, Rfl: 4   glyBURIDE (DIABETA) 5 MG tablet, TAKE 1 TABLET BY MOUTH TWICE DAILY WITH MEALS., Disp: 180 tablet, Rfl: 3   hydrochlorothiazide (HYDRODIURIL) 25 MG tablet, Take 1 tablet (25 mg total) by mouth daily., Disp: 90 tablet, Rfl: 3   levothyroxine (EUTHYROX) 75 MCG tablet, TAKE 1 TABLET BY MOUTH ONCE DAILY BEFORE BREAKFAST ., Disp: 90 tablet, Rfl: 1   lisinopril (ZESTRIL) 40 MG tablet, Take 1 tablet (40 mg total) by mouth daily., Disp: 90 tablet, Rfl: 1   metFORMIN (GLUCOPHAGE) 1000 MG tablet, Take 1 tablet (1,000 mg total) by mouth 2 (two) times daily with a meal., Disp: 180 tablet, Rfl: 1   Multiple Vitamin (MULTIVITAMIN) tablet, Take 1 tablet by mouth every morning., Disp: , Rfl:    mupirocin ointment (BACTROBAN) 2 %, Apply 1 application topically 2 (two) times daily., Disp: 22 g, Rfl: 0   PARoxetine (PAXIL) 20 MG tablet, Take 1 tablet (20 mg total) by mouth daily., Disp: 90 tablet, Rfl: 3   potassium chloride SA (KLOR-CON M) 20 MEQ tablet, Take 1 tablet (20 mEq total) by mouth 2 (two) times daily., Disp: 14 tablet, Rfl: 0   zinc gluconate 50 MG tablet, Take 50 mg by mouth daily., Disp: , Rfl:    Family History  Problem Relation Age of Onset   Hyperlipidemia Mother    Thyroid disease Mother    Hashimoto's thyroiditis Mother    Heart disease Father        open heart surgery   Heart failure Father    Diabetes Father    Dementia Father    Thyroid disease Brother    Breast cancer Maternal  Grandmother    Diabetes Paternal Grandmother    Prostate cancer Paternal Grandfather      Social History   Tobacco Use   Smoking status: Former    Packs/day: 1.50    Years: 25.00    Total pack years: 37.50    Types: Cigarettes    Quit date: 11/24/2001    Years since quitting: 20.6   Smokeless tobacco: Never  Vaping Use   Vaping Use: Never used  Substance Use Topics   Alcohol use: Yes    Alcohol/week: 0.0 standard drinks of alcohol    Comment: OCCASIONALLY DRINKS WINE, ONCE A WEEK   Drug use: No    Allergies as of 07/24/2022 - Review  Complete 07/24/2022  Allergen Reaction Noted   Fosaprepitant Nausea Only and Other (See Comments) 10/27/2021    Review of Systems:    All systems reviewed and negative except where noted in HPI.   Physical Exam:  BP 116/66 (Patient Position: Sitting, Cuff Size: Normal)   Pulse 66   Temp 98.4 F (36.9 C) (Oral)   Ht $R'5\' 4"'rb$  (1.626 m)   Wt 183 lb (83 kg)   BMI 31.41 kg/m  No LMP recorded. Patient has had a hysterectomy.  General:   Alert,  Well-developed, well-nourished, pleasant and cooperative in NAD Head:  Normocephalic and atraumatic. Eyes:  Sclera clear, no icterus.   Conjunctiva pink. Ears:  Normal auditory acuity. Nose:  No deformity, discharge, or lesions. Mouth:  No deformity or lesions,oropharynx pink & moist. Neck:  Supple; no masses or thyromegaly. Lungs:  Respirations even and unlabored.  Clear throughout to auscultation.   No wheezes, crackles, or rhonchi. No acute distress. Heart:  Regular rate and rhythm; no murmurs, clicks, rubs, or gallops. Abdomen:  Normal bowel sounds. Soft, moderately distended, hepatomegaly, right upper quadrant tenderness on the liver, palpable spleen, diastases recti, no guarding or rebound tenderness.   Rectal: Not performed Msk:  Symmetrical without gross deformities. Good, equal movement & strength bilaterally. Pulses:  Normal pulses noted. Extremities:  No clubbing or edema.  No  cyanosis. Neurologic:  Alert and oriented x3;  grossly normal neurologically. Skin:  Intact without significant lesions or rashes. No jaundice. Psych:  Alert and cooperative. Normal mood and affect.  Imaging Studies: Reviewed  Assessment and Plan:   Patsye Sullivant is a 61 y.o. female with metabolic syndrome, history of fatty liver, breast cancer, currently on chemotherapy is seen in consultation for chronic unexplained diarrhea and new diagnosis of cirrhosis of liver  Chronic unexplained diarrhea Perjeta is associated with diarrhea in 64 to 67% of the cases Recommend GI profile PCR to rule out infection Recommend upper endoscopy and colonoscopy for further evaluation with gastric, duodenal biopsies, possible TI evaluation, random colon biopsies  Cirrhosis of liver which explains hepatosplenomegaly, thrombocytopenia Most likely secondary to metabolic syndrome Serum ferritin levels normal Check viral hepatitis panel and rest of the secondary liver disease workup Patient is currently euvolemic, recommend low-sodium diet, information provided EGD to screen for varices as above HCC: Check serum AFP, no evidence of liver lesions PSE: None HRS: None    Follow up in 6 months   Cephas Darby, MD

## 2022-07-24 NOTE — Patient Instructions (Signed)
Low-Sodium Eating Plan Sodium, which is an element that makes up salt, helps you maintain a healthy balance of fluids in your body. Too much sodium can increase your blood pressure and cause fluid and waste to be held in your body. Your health care provider or dietitian may recommend following this plan if you have high blood pressure (hypertension), kidney disease, liver disease, or heart failure. Eating less sodium can help lower your blood pressure, reduce swelling, and protect your heart, liver, and kidneys. What are tips for following this plan? Reading food labels The Nutrition Facts label lists the amount of sodium in one serving of the food. If you eat more than one serving, you must multiply the listed amount of sodium by the number of servings. Choose foods with less than 140 mg of sodium per serving. Avoid foods with 300 mg of sodium or more per serving. Shopping  Look for lower-sodium products, often labeled as "low-sodium" or "no salt added." Always check the sodium content, even if foods are labeled as "unsalted" or "no salt added." Buy fresh foods. Avoid canned foods and pre-made or frozen meals. Avoid canned, cured, or processed meats. Buy breads that have less than 80 mg of sodium per slice. Cooking  Eat more home-cooked food and less restaurant, buffet, and fast food. Avoid adding salt when cooking. Use salt-free seasonings or herbs instead of table salt or sea salt. Check with your health care provider or pharmacist before using salt substitutes. Cook with plant-based oils, such as canola, sunflower, or olive oil. Meal planning When eating at a restaurant, ask that your food be prepared with less salt or no salt, if possible. Avoid dishes labeled as brined, pickled, cured, smoked, or made with soy sauce, miso, or teriyaki sauce. Avoid foods that contain MSG (monosodium glutamate). MSG is sometimes added to Chinese food, bouillon, and some canned foods. Make meals that can  be grilled, baked, poached, roasted, or steamed. These are generally made with less sodium. General information Most people on this plan should limit their sodium intake to 1,500-2,000 mg (milligrams) of sodium each day. What foods should I eat? Fruits Fresh, frozen, or canned fruit. Fruit juice. Vegetables Fresh or frozen vegetables. "No salt added" canned vegetables. "No salt added" tomato sauce and paste. Low-sodium or reduced-sodium tomato and vegetable juice. Grains Low-sodium cereals, including oats, puffed wheat and rice, and shredded wheat. Low-sodium crackers. Unsalted rice. Unsalted pasta. Low-sodium bread. Whole-grain breads and whole-grain pasta. Meats and other proteins Fresh or frozen (no salt added) meat, poultry, seafood, and fish. Low-sodium canned tuna and salmon. Unsalted nuts. Dried peas, beans, and lentils without added salt. Unsalted canned beans. Eggs. Unsalted nut butters. Dairy Milk. Soy milk. Cheese that is naturally low in sodium, such as ricotta cheese, fresh mozzarella, or Swiss cheese. Low-sodium or reduced-sodium cheese. Cream cheese. Yogurt. Seasonings and condiments Fresh and dried herbs and spices. Salt-free seasonings. Low-sodium mustard and ketchup. Sodium-free salad dressing. Sodium-free light mayonnaise. Fresh or refrigerated horseradish. Lemon juice. Vinegar. Other foods Homemade, reduced-sodium, or low-sodium soups. Unsalted popcorn and pretzels. Low-salt or salt-free chips. The items listed above may not be a complete list of foods and beverages you can eat. Contact a dietitian for more information. What foods should I avoid? Vegetables Sauerkraut, pickled vegetables, and relishes. Olives. French fries. Onion rings. Regular canned vegetables (not low-sodium or reduced-sodium). Regular canned tomato sauce and paste (not low-sodium or reduced-sodium). Regular tomato and vegetable juice (not low-sodium or reduced-sodium). Frozen vegetables in  sauces. Grains   Instant hot cereals. Bread stuffing, pancake, and biscuit mixes. Croutons. Seasoned rice or pasta mixes. Noodle soup cups. Boxed or frozen macaroni and cheese. Regular salted crackers. Self-rising flour. Meats and other proteins Meat or fish that is salted, canned, smoked, spiced, or pickled. Precooked or cured meat, such as sausages or meat loaves. Bacon. Ham. Pepperoni. Hot dogs. Corned beef. Chipped beef. Salt pork. Jerky. Pickled herring. Anchovies and sardines. Regular canned tuna. Salted nuts. Dairy Processed cheese and cheese spreads. Hard cheeses. Cheese curds. Blue cheese. Feta cheese. String cheese. Regular cottage cheese. Buttermilk. Canned milk. Fats and oils Salted butter. Regular margarine. Ghee. Bacon fat. Seasonings and condiments Onion salt, garlic salt, seasoned salt, table salt, and sea salt. Canned and packaged gravies. Worcestershire sauce. Tartar sauce. Barbecue sauce. Teriyaki sauce. Soy sauce, including reduced-sodium. Steak sauce. Fish sauce. Oyster sauce. Cocktail sauce. Horseradish that you find on the shelf. Regular ketchup and mustard. Meat flavorings and tenderizers. Bouillon cubes. Hot sauce. Pre-made or packaged marinades. Pre-made or packaged taco seasonings. Relishes. Regular salad dressings. Salsa. Other foods Salted popcorn and pretzels. Corn chips and puffs. Potato and tortilla chips. Canned or dried soups. Pizza. Frozen entrees and pot pies. The items listed above may not be a complete list of foods and beverages you should avoid. Contact a dietitian for more information. Summary Eating less sodium can help lower your blood pressure, reduce swelling, and protect your heart, liver, and kidneys. Most people on this plan should limit their sodium intake to 1,500-2,000 mg (milligrams) of sodium each day. Canned, boxed, and frozen foods are high in sodium. Restaurant foods, fast foods, and pizza are also very high in sodium. You also get sodium by  adding salt to food. Try to cook at home, eat more fresh fruits and vegetables, and eat less fast food and canned, processed, or prepared foods. This information is not intended to replace advice given to you by your health care provider. Make sure you discuss any questions you have with your health care provider. Document Revised: 01/08/2020 Document Reviewed: 11/04/2019 Elsevier Patient Education  2023 Elsevier Inc.  

## 2022-07-26 ENCOUNTER — Ambulatory Visit: Payer: 59

## 2022-07-26 ENCOUNTER — Ambulatory Visit: Payer: 59 | Admitting: Certified Registered Nurse Anesthetist

## 2022-07-26 ENCOUNTER — Encounter
Admission: RE | Disposition: A | Discharge status: Home / Self Care | Payer: Self-pay | Source: Ambulatory Visit | Attending: Gastroenterology

## 2022-07-26 ENCOUNTER — Encounter: Payer: Self-pay | Admitting: Gastroenterology

## 2022-07-26 ENCOUNTER — Ambulatory Visit
Admission: RE | Admit: 2022-07-26 | Discharge: 2022-07-26 | Disposition: A | Discharge status: Home / Self Care | Payer: 59 | Source: Ambulatory Visit | Attending: Gastroenterology | Admitting: Gastroenterology

## 2022-07-26 ENCOUNTER — Other Ambulatory Visit: Payer: 59

## 2022-07-26 ENCOUNTER — Other Ambulatory Visit: Payer: Self-pay

## 2022-07-26 DIAGNOSIS — I10 Essential (primary) hypertension: Secondary | ICD-10-CM | POA: Insufficient documentation

## 2022-07-26 DIAGNOSIS — I350 Nonrheumatic aortic (valve) stenosis: Secondary | ICD-10-CM | POA: Insufficient documentation

## 2022-07-26 DIAGNOSIS — K222 Esophageal obstruction: Secondary | ICD-10-CM | POA: Diagnosis not present

## 2022-07-26 DIAGNOSIS — K529 Noninfective gastroenteritis and colitis, unspecified: Secondary | ICD-10-CM | POA: Diagnosis not present

## 2022-07-26 DIAGNOSIS — Z7984 Long term (current) use of oral hypoglycemic drugs: Secondary | ICD-10-CM | POA: Insufficient documentation

## 2022-07-26 DIAGNOSIS — K7460 Unspecified cirrhosis of liver: Secondary | ICD-10-CM | POA: Diagnosis not present

## 2022-07-26 DIAGNOSIS — E079 Disorder of thyroid, unspecified: Secondary | ICD-10-CM | POA: Diagnosis not present

## 2022-07-26 DIAGNOSIS — K2960 Other gastritis without bleeding: Secondary | ICD-10-CM

## 2022-07-26 DIAGNOSIS — K319 Disease of stomach and duodenum, unspecified: Secondary | ICD-10-CM | POA: Diagnosis not present

## 2022-07-26 DIAGNOSIS — Z87891 Personal history of nicotine dependence: Secondary | ICD-10-CM | POA: Diagnosis not present

## 2022-07-26 DIAGNOSIS — G4730 Sleep apnea, unspecified: Secondary | ICD-10-CM | POA: Diagnosis not present

## 2022-07-26 DIAGNOSIS — K449 Diaphragmatic hernia without obstruction or gangrene: Secondary | ICD-10-CM | POA: Diagnosis not present

## 2022-07-26 DIAGNOSIS — E119 Type 2 diabetes mellitus without complications: Secondary | ICD-10-CM | POA: Insufficient documentation

## 2022-07-26 HISTORY — PX: ESOPHAGOGASTRODUODENOSCOPY (EGD) WITH PROPOFOL: SHX5813

## 2022-07-26 HISTORY — PX: COLONOSCOPY WITH PROPOFOL: SHX5780

## 2022-07-26 LAB — GI PROFILE, STOOL, PCR

## 2022-07-26 LAB — HEPATITIS B SURFACE ANTIBODY,QUALITATIVE: Hep B Surface Ab, Qual: NONREACTIVE

## 2022-07-26 LAB — ANA: ANA Titer 1: NEGATIVE

## 2022-07-26 LAB — ALPHA-1-ANTITRYPSIN: A-1 Antitrypsin: 140 mg/dL (ref 101–187)

## 2022-07-26 LAB — HEPATITIS B SURFACE ANTIGEN: Hepatitis B Surface Ag: NEGATIVE

## 2022-07-26 LAB — CERULOPLASMIN: Ceruloplasmin: 22.2 mg/dL (ref 19.0–39.0)

## 2022-07-26 LAB — ANTI-SMOOTH MUSCLE ANTIBODY, IGG: Smooth Muscle Ab: 12 Units (ref 0–19)

## 2022-07-26 LAB — MITOCHONDRIAL ANTIBODIES: Mitochondrial Ab: <20 Units (ref 0.0–20.0)

## 2022-07-26 LAB — HEPATITIS C ANTIBODY: Hep C Virus Ab: NONREACTIVE

## 2022-07-26 LAB — HEPATITIS B CORE ANTIBODY, TOTAL: Hep B Core Total Ab: NEGATIVE

## 2022-07-26 LAB — HEPATITIS A ANTIBODY, TOTAL: hep A Total Ab: NEGATIVE

## 2022-07-26 LAB — GLUCOSE, CAPILLARY: Glucose-Capillary: 235 mg/dL — ABNORMAL HIGH (ref 70–99)

## 2022-07-26 LAB — AFP TUMOR MARKER: AFP, Serum, Tumor Marker: 2.8 ng/mL (ref 0.0–9.2)

## 2022-07-26 SURGERY — COLONOSCOPY WITH PROPOFOL
Anesthesia: General

## 2022-07-26 MED ORDER — PROPOFOL 10 MG/ML IV BOLUS
INTRAVENOUS | Status: DC | PRN
  Administered 2022-07-26: 10 mg via INTRAVENOUS
  Administered 2022-07-26 (×3): 20 mg via INTRAVENOUS
  Administered 2022-07-26: 70 mg via INTRAVENOUS

## 2022-07-26 MED ORDER — PHENYLEPHRINE 80 MCG/ML (10ML) SYRINGE FOR IV PUSH (FOR BLOOD PRESSURE SUPPORT)
PREFILLED_SYRINGE | INTRAVENOUS | Status: DC | PRN
  Administered 2022-07-26 (×3): 40 ug via INTRAVENOUS

## 2022-07-26 MED ORDER — OMEPRAZOLE 40 MG PO CPDR: 40.0000 mg | DELAYED_RELEASE_CAPSULE | Freq: Every day | ORAL | 2 refills | Status: DC

## 2022-07-26 MED ORDER — GLYCOPYRROLATE 0.2 MG/ML IJ SOLN
INTRAMUSCULAR | Status: DC | PRN
  Administered 2022-07-26: .2 mg via INTRAVENOUS

## 2022-07-26 MED ORDER — PROPOFOL 500 MG/50ML IV EMUL
INTRAVENOUS | Status: DC | PRN
  Administered 2022-07-26: 150 ug/kg/min via INTRAVENOUS

## 2022-07-26 MED ORDER — LIDOCAINE HCL (CARDIAC) PF 100 MG/5ML IV SOSY
PREFILLED_SYRINGE | INTRAVENOUS | Status: DC | PRN
  Administered 2022-07-26: 100 mg via INTRAVENOUS

## 2022-07-26 MED ORDER — SODIUM CHLORIDE 0.9 % IV SOLN: INTRAVENOUS | Status: DC

## 2022-07-26 MED ORDER — EPHEDRINE SULFATE (PRESSORS) 50 MG/ML IJ SOLN
INTRAMUSCULAR | Status: DC | PRN
  Administered 2022-07-26: 5 mg via INTRAVENOUS
  Administered 2022-07-26: 2.5 mg via INTRAVENOUS
  Administered 2022-07-26: 5 mg via INTRAVENOUS

## 2022-07-26 NOTE — Anesthesia Postprocedure Evaluation (Signed)
Anesthesia Post Note  Patient: Clemmie Buelna  Procedure(s) Performed: COLONOSCOPY WITH PROPOFOL ESOPHAGOGASTRODUODENOSCOPY (EGD) WITH PROPOFOL  Patient location during evaluation: Endoscopy Anesthesia Type: General Level of consciousness: awake and alert Pain management: pain level controlled Vital Signs Assessment: post-procedure vital signs reviewed and stable Respiratory status: spontaneous breathing, nonlabored ventilation, respiratory function stable and patient connected to nasal cannula oxygen Cardiovascular status: blood pressure returned to baseline and stable Postop Assessment: no apparent nausea or vomiting Anesthetic complications: no   No notable events documented.   Last Vitals:  Vitals:   07/26/22 0938 07/26/22 0948  BP: (!) 132/56 (!) 149/61  Pulse:    Resp:    Temp: (!) 35.7 C   SpO2:      Last Pain:  Vitals:   07/26/22 0948  TempSrc:   PainSc: 0-No pain                 Arita Miss

## 2022-07-26 NOTE — Transfer of Care (Signed)
Immediate Anesthesia Transfer of Care Note  Patient: Emily Tapia  Procedure(s) Performed: COLONOSCOPY WITH PROPOFOL ESOPHAGOGASTRODUODENOSCOPY (EGD) WITH PROPOFOL  Patient Location: Endoscopy Unit  Anesthesia Type:General  Level of Consciousness: drowsy  Airway & Oxygen Therapy: Patient Spontanous Breathing  Post-op Assessment: Report given to RN and Post -op Vital signs reviewed and stable  Post vital signs: Reviewed and stable  Last Vitals:  Vitals Value Taken Time  BP 132/56 07/26/22 0938  Temp 35.7 C 07/26/22 0938  Pulse 61 07/26/22 0939  Resp 15 07/26/22 0939  SpO2 100 % 07/26/22 0939  Vitals shown include unvalidated device data.  Last Pain:  Vitals:   07/26/22 0938  TempSrc: Temporal  PainSc: Asleep         Complications: No notable events documented.

## 2022-07-26 NOTE — Op Note (Signed)
[facility] Gastroenterology Patient Name: Emily Tapia Procedure Date: 07/26/2022 8:36 AM MRN: [id number] Account #: [id number] Date of Birth: 02/23/1961 Admit Type: Outpatient Age: 61 Room: [facility] ENDO ROOM 4 Gender: Female Note Status: Finalized Instrument Name: Michaelle Birks [id number] Procedure:             Upper GI endoscopy Indications:           Cirrhosis rule out esophageal varices, Diarrhea Providers:             Lin Landsman MD, MD Referring MD:          Jaci Standard. Rollene Rotunda (Referring MD) Medicines:             General Anesthesia Complications:         No immediate complications. Estimated blood loss: None. Procedure:             Pre-Anesthesia Assessment:                        - Prior to the procedure, a History and Physical was                         performed, and patient medications and allergies were                         reviewed. The patient is competent. The risks and                         benefits of the procedure and the sedation options and                         risks were discussed with the patient. All questions                         were answered and informed consent was obtained.                         Patient identification and proposed procedure were                         verified by the physician, the nurse, the                         anesthesiologist, the anesthetist and the technician                         in the pre-procedure area in the procedure room in the                         endoscopy suite. Mental Status Examination: alert and                         oriented. Airway Examination: normal oropharyngeal                         airway and neck mobility. Respiratory Examination:                         clear to auscultation. CV Examination: normal.  Prophylactic Antibiotics: The patient does not require                         prophylactic antibiotics. Prior Anticoagulants: The                          patient has taken no previous anticoagulant or                         antiplatelet agents. ASA Grade Assessment: III - A                         patient with severe systemic disease. After reviewing                         the risks and benefits, the patient was deemed in                         satisfactory condition to undergo the procedure. The                         anesthesia plan was to use general anesthesia.                         Immediately prior to administration of medications,                         the patient was re-assessed for adequacy to receive                         sedatives. The heart rate, respiratory rate, oxygen                         saturations, blood pressure, adequacy of pulmonary                         ventilation, and response to care were monitored                         throughout the procedure. The physical status of the                         patient was re-assessed after the procedure.                        After obtaining informed consent, the endoscope was                         passed under direct vision. Throughout the procedure,                         the patient's blood pressure, pulse, and oxygen                         saturations were monitored continuously. The Endoscope                         was introduced through the mouth, and advanced to the  second part of duodenum. The upper GI endoscopy was                         accomplished without difficulty. The patient tolerated                         the procedure well. Findings:      The duodenal bulb and second portion of the duodenum were normal.       Biopsies were taken with a cold forceps for histology.      Multiple dispersed diminutive erosions with stigmata of recent bleeding       were found in the gastric body and in the gastric antrum. Biopsies were       taken with a cold forceps for histology.      The cardia and gastric fundus were  normal on retroflexion.      A small hiatal hernia was present.      One benign-appearing, intrinsic moderate (circumferential scarring or       stenosis; an endoscope may pass) stenosis from schatzki's ring was found       20 cm from the incisors. This stenosis measured less than one cm (in       length). The stenosis was traversed.      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      Esophagogastric landmarks were identified: the gastroesophageal junction       was found at 35 cm from the incisors.      The mid esophagus and distal esophagus were normal. Impression:            - Normal duodenal bulb and second portion of the                         duodenum. Biopsied.                        - Erosive gastropathy with stigmata of recent                         bleeding. Biopsied.                        - Small hiatal hernia.                        - Benign-appearing esophageal stenosis.                        - Ectopic gastric mucosa in the upper third of the                         esophagus.                        - Esophagogastric landmarks identified.                        - Normal mid esophagus and distal esophagus. Recommendation:        - Follow an antireflux regimen.                        -  Use a proton pump inhibitor PO BID for 4 weeks.                        - Proceed with colonoscopy as scheduled                        See colonoscopy report Procedure Code(s):     --- Professional ---                        [phone], Esophagogastroduodenoscopy, flexible,                         transoral; with biopsy, single or multiple Diagnosis Code(s):     --- Professional ---                        K92.2, Gastrointestinal hemorrhage, unspecified                        K44.9, Diaphragmatic hernia without obstruction or                         gangrene                        K22.2, Esophageal obstruction                         Q40.2, Other specified congenital malformations of                         stomach                        K74.60, Unspecified cirrhosis of liver                        R19.7, Diarrhea, unspecified CPT copyright 2019 American Medical [employer]. All rights reserved. The codes documented in this report are preliminary and upon coder review may  be revised to meet current compliance requirements. Dr. Ulyess Mort Lin Landsman MD, MD 07/26/2022 9:48:37 AM This report has been signed electronically. Number of Addenda: 0 Note Initiated On: 07/26/2022 8:36 AM Estimated Blood Loss:  Estimated blood loss: none.      [facility]

## 2022-07-26 NOTE — Op Note (Signed)
[facility] Gastroenterology Patient Name: Emily Tapia Procedure Date: 07/26/2022 8:35 AM MRN: [id number] Account #: [id number] Date of Birth: [birth year] Admit Type: Outpatient Age: 61 Room: [facility] ENDO ROOM 4 Gender: Female Note Status: Finalized Instrument Name: Park Meo [id number] Procedure:             Colonoscopy Indications:           Chronic diarrhea Providers:             Lin Landsman MD, MD Referring MD:          Jaci Standard. Rollene Rotunda (Referring MD) Medicines:             General Anesthesia Complications:         No immediate complications. Estimated blood loss: None. Procedure:             Pre-Anesthesia Assessment:                        - Prior to the procedure, a History and Physical was                         performed, and patient medications and allergies were                         reviewed. The patient is competent. The risks and                         benefits of the procedure and the sedation options and                         risks were discussed with the patient. All questions                         were answered and informed consent was obtained.                         Patient identification and proposed procedure were                         verified by the physician, the nurse, the                         anesthesiologist, the anesthetist and the technician                         in the pre-procedure area in the procedure room in the                         endoscopy suite. Mental Status Examination: alert and                         oriented. Airway Examination: normal oropharyngeal                         airway and neck mobility. Respiratory Examination:                         clear to auscultation. CV Examination: normal.  Prophylactic Antibiotics: The patient does not require                         prophylactic antibiotics. Prior Anticoagulants: The                         patient has taken no previous  anticoagulant or                         antiplatelet agents. ASA Grade Assessment: III - A                         patient with severe systemic disease. After reviewing                         the risks and benefits, the patient was deemed in                         satisfactory condition to undergo the procedure. The                         anesthesia plan was to use general anesthesia.                         Immediately prior to administration of medications,                         the patient was re-assessed for adequacy to receive                         sedatives. The heart rate, respiratory rate, oxygen                         saturations, blood pressure, adequacy of pulmonary                         ventilation, and response to care were monitored                         throughout the procedure. The physical status of the                         patient was re-assessed after the procedure.                        After obtaining informed consent, the colonoscope was                         passed under direct vision. Throughout the procedure,                         the patient's blood pressure, pulse, and oxygen                         saturations were monitored continuously. The                         Colonoscope was introduced through the anus and  advanced to the the terminal ileum, with                         identification of the appendiceal orifice and IC                         valve. The colonoscopy was performed without                         difficulty. The patient tolerated the procedure well.                         The quality of the bowel preparation was adequate. Findings:      The perianal and digital rectal examinations were normal. Pertinent       negatives include normal sphincter tone and no palpable rectal lesions.      The terminal ileum appeared normal.      Normal mucosa was found in the left colon and in the right colon.        Biopsies for histology were taken with a cold forceps from the entire       colon for evaluation of microscopic colitis.      The retroflexed view of the distal rectum and anal verge was normal and       showed no anal or rectal abnormalities. Impression:            - The examined portion of the ileum was normal.                        - Normal mucosa in the left colon and in the right                         colon. Biopsied.                        - The distal rectum and anal verge are normal on                         retroflexion view. Recommendation:        - Discharge patient to home (with escort).                        - Resume previous diet, diabetic (ADA) diet and low                         sodium diet.                        - Continue present medications.                        - Await pathology results.                        - Return to my office as previously scheduled. Procedure Code(s):     --- Professional ---                        [phone], Colonoscopy, flexible; with biopsy, single or  multiple Diagnosis Code(s):     --- Professional ---                        K52.9, Noninfective gastroenteritis and colitis,                         unspecified CPT copyright 2019 American Medical Association. All rights reserved. The codes documented in this report are preliminary and upon coder review may  be revised to meet current compliance requirements. Dr. Ulyess Mort Lin Landsman MD, MD 07/26/2022 9:45:19 AM This report has been signed electronically. Number of Addenda: 0 Note Initiated On: 07/26/2022 8:35 AM Scope Withdrawal Time: 0 hours 10 minutes 16 seconds  Total Procedure Duration: 0 hours 14 minutes 13 seconds  Estimated Blood Loss:  Estimated blood loss: none.      [facility]

## 2022-07-26 NOTE — Anesthesia Procedure Notes (Signed)
Date/Time: 07/26/2022 9:01 AM  Performed by: Lily Peer, Mairin Lindsley, CRNAPre-anesthesia Checklist: Patient identified, Emergency Drugs available and Suction available Patient Re-evaluated:Patient Re-evaluated prior to induction Oxygen Delivery Method: Simple face mask Induction Type: IV induction

## 2022-07-26 NOTE — Anesthesia Preprocedure Evaluation (Signed)
Anesthesia Evaluation  Patient identified by MRN, date of birth, ID band Patient awake    Reviewed: Allergy & Precautions, NPO status , Patient's Chart, lab work & pertinent test results  History of Anesthesia Complications Negative for: history of anesthetic complications  Airway Mallampati: II  TM Distance: >3 FB Neck ROM: Full    Dental no notable dental hx. (+) Teeth Intact   Pulmonary sleep apnea , neg COPD, Patient abstained from smoking.Not current smoker, former smoker,    Pulmonary exam normal breath sounds clear to auscultation       Cardiovascular Exercise Tolerance: Good METShypertension, Pt. on medications (-) CAD and (-) Past MI (-) dysrhythmias + Valvular Problems/Murmurs AS  Rhythm:Regular Rate:Normal + Systolic murmurs Denies symptoms from her AS  1. Left ventricular ejection fraction, by estimation, is 60 to 65%. The  left ventricle has normal function. The left ventricle has no regional  wall motion abnormalities. Left ventricular diastolic parameters are  consistent with Grade II diastolic  dysfunction (pseudonormalization). The average left ventricular global  longitudinal strain is -21.7 %. The global longitudinal strain is normal.  2. Right ventricular systolic function is normal. The right ventricular  size is normal. Tricuspid regurgitation signal is inadequate for assessing  PA pressure.  3. Left atrial size was mildly dilated.  4. The mitral valve is normal in structure. Trivial mitral valve  regurgitation. No evidence of mitral stenosis.  5. The aortic valve is calcified. Aortic valve regurgitation is not  visualized. Moderate to severe aortic valve stenosis. Aortic valve area,  by VTI measures 0.70 cm. Aortic valve mean gradient measures 25.0 mmHg.  Aortic valve morphology is not  consistent with severe stenosis. Some views suggests subvalvular membrane  which could be responsibile for the  gradient. Consider TEE or cardiac MRI.  6. The inferior vena cava is normal in size with greater than 50%  respiratory variability, suggesting right atrial pressure of 3 mmHg.    Neuro/Psych PSYCHIATRIC DISORDERS Anxiety negative neurological ROS     GI/Hepatic neg GERD  ,(+)     (-) substance abuse  ,   Endo/Other  diabetesHypothyroidism   Renal/GU negative Renal ROS     Musculoskeletal   Abdominal   Peds  Hematology   Anesthesia Other Findings Past Medical History: No date: Anxiety No date: Aortic stenosis No date: Diabetes mellitus No date: Edema No date: Heart murmur No date: Hypertension No date: Sleep apnea No date: Thyroid disease  Reproductive/Obstetrics                             Anesthesia Physical Anesthesia Plan  ASA: 3  Anesthesia Plan: General   Post-op Pain Management: Minimal or no pain anticipated   Induction: Intravenous  PONV Risk Score and Plan: 3 and Propofol infusion, TIVA and Ondansetron  Airway Management Planned: Nasal Cannula  Additional Equipment: None  Intra-op Plan:   Post-operative Plan:   Informed Consent: I have reviewed the patients History and Physical, chart, labs and discussed the procedure including the risks, benefits and alternatives for the proposed anesthesia with the patient or authorized representative who has indicated his/her understanding and acceptance.     Dental advisory given  Plan Discussed with: CRNA and Surgeon  Anesthesia Plan Comments: (Discussed risks of anesthesia with patient, including possibility of difficulty with spontaneous ventilation under anesthesia necessitating airway intervention, PONV, and rare risks such as cardiac or respiratory or neurological events, and allergic reactions. Discussed the  role of CRNA in patient's perioperative care. Patient understands. Patient counseled on being higher risk for anesthesia due to comorbidities: severe AS. Patient was  told about increased risk of cardiac and respiratory events, including death. )        Anesthesia Quick Evaluation

## 2022-07-26 NOTE — H&P (Signed)
Cephas Darby, MD [address]  [place]  [place], [place] [address]  Main: [phone]  Fax: [phone] Pager: [phone]  Primary Care Physician:  [name], [name] Primary Gastroenterologist:  Dr. Cephas Darby  Pre-Procedure History & Physical: HPI:  Emily Tapia is a 61 y.o. female is here for an endoscopy and colonoscopy.   Past Medical History:  Diagnosis Date   Anxiety    Aortic stenosis    Diabetes mellitus    Edema    Heart murmur    Hypertension    Sleep apnea    Thyroid disease     Past Surgical History:  Procedure Laterality Date   ABDOMINAL HYSTERECTOMY  11/17/2013   BREAST BIOPSY     x2   BREAST BIOPSY Left 07/20/2021   [place] Bx, Ribbon Clip, Path pending   BREAST BIOPSY Left 07/20/2021   Stereo Bx, X-clip, path pending   BREAST BIOPSY Left 07/20/2021   Stereo biopsy, coil clip, path pending   BREAST BIOPSY Right 07/20/2021   Stereo Bx, Ribbon Clip, path pending   CESAREAN SECTION     x 2   CHOLECYSTECTOMY  12/17/2001   DUE TO STONES   DILATION AND CURETTAGE OF UTERUS     ELBOW SURGERY     POLYPECTOMY  12/17/1993   PORTACATH PLACEMENT Right 10/25/2021   Procedure: INSERTION PORT-A-CATH;  Surgeon: Ronny Bacon, MD;  Location: [facility] ORS;  Service: General;  Laterality: Right;   SIMPLE MASTECTOMY WITH AXILLARY SENTINEL NODE BIOPSY Left 08/16/2021   Procedure: SIMPLE MASTECTOMY WITH AXILLARY SENTINEL NODE BIOPSY;  Surgeon: Ronny Bacon, MD;  Location: [facility] ORS;  Service: General;  Laterality: Left;    Prior to Admission medications   Medication Sig Start Date End Date Taking? Authorizing Provider  amLODipine (NORVASC) 5 MG tablet Take 1 tablet (5 mg total) by mouth daily. 03/13/22  Yes Tally Joe T, [name]  Ascorbic Acid (VITAMIN C PO) Take 1 tablet by mouth daily.   Yes [provider]  carvedilol (COREG) 25 MG tablet Take 1 tablet by mouth twice daily 06/05/22  Yes Tally Joe T, [name]  Cholecalciferol (VITAMIN D3)  1000 UNITS CAPS Take 1,000 Units by mouth in the morning.   Yes [provider]  furosemide (LASIX) 40 MG tablet TAKE 1 TABLET BY MOUTH ONCE DAILY AS NEEDED FOR FLUID OR EDEMA 10/26/21  Yes Tally Joe T, [name]  hydrochlorothiazide (HYDRODIURIL) 25 MG tablet Take 1 tablet (25 mg total) by mouth daily. 12/06/21 12/01/22 Yes Tally Joe T, [name]  levothyroxine (EUTHYROX) 75 MCG tablet TAKE 1 TABLET BY MOUTH ONCE DAILY BEFORE BREAKFAST . 06/06/22  Yes [name], [name]  lisinopril (ZESTRIL) 40 MG tablet Take 1 tablet (40 mg total) by mouth daily. 06/06/22 12/03/22 Yes Tally Joe T, [name]  Multiple Vitamin (MULTIVITAMIN) tablet Take 1 tablet by mouth every morning.   Yes [provider]  PARoxetine (PAXIL) 20 MG tablet Take 1 tablet (20 mg total) by mouth daily. 06/06/22  Yes [name], [name]  aspirin 81 MG EC tablet Take 81 mg by mouth at bedtime.    [provider]  diphenoxylate-atropine (LOMOTIL) 2.5-0.025 MG tablet Take 2 tablets by mouth 4 (four) times daily as needed for diarrhea or loose stools. 05/24/22   Verlon Au, NP  Ferrous Sulfate (IRON) 325 (65 Fe) MG TABS Take 1 tablet by mouth once daily with breakfast 04/24/19   Chrismon, Dennis E, PA-C  glucose blood (CONTOUR NEXT TEST)  test strip Test fasting sugar each morning. Recheck if having hypoglycemic symptoms. 10/03/18   Chrismon, Simona Huh E, PA-C  glyBURIDE (DIABETA) 5 MG tablet TAKE 1 TABLET BY MOUTH TWICE DAILY WITH MEALS. 12/06/21   [name], [name]  metFORMIN (GLUCOPHAGE) 1000 MG tablet Take 1 tablet (1,000 mg total) by mouth 2 (two) times daily with a meal. 06/06/22   [name], [name]  mupirocin ointment (BACTROBAN) 2 % Apply 1 application topically 2 (two) times daily. 11/07/21   Borders, Kirt Boys, NP  potassium chloride SA (KLOR-CON M) 20 MEQ tablet Take 1 tablet (20 mEq total) by mouth 2 (two) times daily. 07/05/22   Jacquelin Hawking, NP  zinc gluconate 50 MG tablet Take 50 mg by mouth daily.     [provider]    Allergies as of 07/12/2022 - Review Complete 07/11/2022  Allergen Reaction Noted   Fosaprepitant Nausea Only and Other (See Comments) 10/27/2021    Family History  Problem Relation Age of Onset   Hyperlipidemia Mother    Thyroid disease Mother    Hashimoto's thyroiditis Mother    Heart disease Father        open heart surgery   Heart failure Father    Diabetes Father    Dementia Father    Thyroid disease Brother    Breast cancer Maternal Grandmother    Diabetes Paternal Grandmother    Prostate cancer Paternal Grandfather     Social History   Socioeconomic History   Marital status: Married    Spouse name: [name]   Number of children: 2   Years of education: 15   Highest education level: Not on file  Occupational History   Occupation: [employer]  Tobacco Use   Smoking status: Former    Packs/day: 1.50    Years: 25.00    Total pack years: 37.50    Types: Cigarettes    Quit date: 11/24/2001    Years since quitting: 20.6   Smokeless tobacco: Never  Vaping Use   Vaping Use: Never used  Substance and Sexual Activity   Alcohol use: Yes    Alcohol/week: 0.0 standard drinks of alcohol    Comment: OCCASIONALLY DRINKS WINE, ONCE A WEEK   Drug use: No   Sexual activity: Not on file  Other Topics Concern   Not on file  Social History Narrative   Not on file   Social Determinants of Health   Financial Resource Strain: Not on file  Food Insecurity: Not on file  Transportation Needs: Not on file  Physical Activity: Not on file  Stress: Not on file  Social Connections: Not on file  Intimate Partner Violence: Not on file    Review of Systems: See HPI, otherwise negative ROS  Physical Exam: BP (!) 161/84   Pulse 60   Temp (!) 96.7 F (35.9 C) (Temporal)   Resp 16   Ht '5\' 4"'$  (1.626 m)   Wt 83 kg   SpO2 96%   BMI 31.41 kg/m  General:   Alert,  pleasant and cooperative in NAD Head:  Normocephalic and atraumatic. Neck:  Supple; no  masses or thyromegaly. Lungs:  Clear throughout to auscultation.    Heart:  Regular rate and rhythm. Abdomen:  Soft, nontender and nondistended. Normal bowel sounds, without guarding, and without rebound.   Neurologic:  Alert and  oriented x4;  grossly normal neurologically.  Impression/Plan: Emily Tapia is here for an endoscopy and colonoscopy to be performed for chronic diarrhea, cirrhosis  of liver  Risks, benefits, limitations, and alternatives regarding  endoscopy and colonoscopy have been reviewed with the patient.  Questions have been answered.  All parties agreeable.   Sherri Sear, MD  07/26/2022, 8:41 AM

## 2022-07-27 ENCOUNTER — Encounter: Payer: Self-pay | Admitting: Gastroenterology

## 2022-07-27 LAB — SURGICAL PATHOLOGY

## 2022-07-30 ENCOUNTER — Telehealth: Payer: Self-pay

## 2022-07-30 NOTE — Telephone Encounter (Signed)
-----   Message from Lin Landsman, MD sent at 07/26/2022  6:03 PM EDT ----- Please inform patient that the blood work for other causes of liver disease came back normal.  The cause of her cirrhosis is fatty liver. She also needs hepatitis A and B vaccine  RV

## 2022-07-30 NOTE — Telephone Encounter (Signed)
Patient verbalized understanding of results. She will come for vaccine on Wednesday

## 2022-07-31 ENCOUNTER — Other Ambulatory Visit: Payer: Self-pay | Admitting: Oncology

## 2022-07-31 DIAGNOSIS — Z170 Estrogen receptor positive status [ER+]: Secondary | ICD-10-CM

## 2022-07-31 NOTE — Progress Notes (Signed)
ON PATHWAY REGIMEN - Breast  No Change  Continue With Treatment as Ordered.  Original Decision Date/Time: 09/13/2021 09:37     Cycle 1: A cycle is 21 days:     Pertuzumab      Trastuzumab-xxxx      Docetaxel      Carboplatin    Cycles 2 through 6: A cycle is every 21 days:     Pertuzumab      Trastuzumab-xxxx      Docetaxel      Carboplatin    Cycles 7 through 17: A cycle is every 21 days:     Pertuzumab      Trastuzumab-xxxx   **Always confirm dose/schedule in your pharmacy ordering system**  Patient Characteristics: Postoperative without Neoadjuvant Therapy (Pathologic Staging), Invasive Disease, Adjuvant Therapy, HER2 Positive, ER Positive, Node Positive, pT2, pN1a or Higher Therapeutic Status: Postoperative without Neoadjuvant Therapy (Pathologic Staging) AJCC Grade: G3 AJCC N Category: pN1a AJCC M Category: cM0 ER Status: Positive (+) AJCC 8 Stage Grouping: IB HER2 Status: Positive (+) Oncotype Dx Recurrence Score: Not Appropriate AJCC T Category: pT2 PR Status: Positive (+) Adjuvant Therapy Status: No Adjuvant Therapy Received Yet or Changing Initial Adjuvant Regimen due to Tolerance Intent of Therapy: Curative Intent, Discussed with Patient

## 2022-08-01 ENCOUNTER — Ambulatory Visit (INDEPENDENT_AMBULATORY_CARE_PROVIDER_SITE_OTHER): Payer: 59 | Admitting: Gastroenterology

## 2022-08-01 DIAGNOSIS — Z23 Encounter for immunization: Secondary | ICD-10-CM | POA: Diagnosis not present

## 2022-08-01 NOTE — Progress Notes (Unsigned)
Gave patient the Hepatitis A and B vaccine in patient right deltoid. The patient tolerating the immunization okay with no side effects

## 2022-08-02 ENCOUNTER — Inpatient Hospital Stay: Payer: 59 | Attending: Oncology

## 2022-08-02 ENCOUNTER — Inpatient Hospital Stay: Payer: 59

## 2022-08-02 ENCOUNTER — Other Ambulatory Visit: Payer: Self-pay

## 2022-08-02 VITALS — BP 136/65 | HR 61 | Temp 97.6°F | Resp 18

## 2022-08-02 DIAGNOSIS — Z5112 Encounter for antineoplastic immunotherapy: Secondary | ICD-10-CM | POA: Diagnosis not present

## 2022-08-02 DIAGNOSIS — Z79899 Other long term (current) drug therapy: Secondary | ICD-10-CM | POA: Insufficient documentation

## 2022-08-02 DIAGNOSIS — Z170 Estrogen receptor positive status [ER+]: Secondary | ICD-10-CM | POA: Insufficient documentation

## 2022-08-02 DIAGNOSIS — C50812 Malignant neoplasm of overlapping sites of left female breast: Secondary | ICD-10-CM | POA: Diagnosis not present

## 2022-08-02 LAB — CBC WITH DIFFERENTIAL/PLATELET
Abs Immature Granulocytes: 0.02 10*3/uL (ref 0.00–0.07)
Basophils Absolute: 0 10*3/uL (ref 0.0–0.1)
Basophils Relative: 0 %
Eosinophils Absolute: 0.1 10*3/uL (ref 0.0–0.5)
Eosinophils Relative: 2 %
HCT: 32.5 % — ABNORMAL LOW (ref 36.0–46.0)
Hemoglobin: 10.2 g/dL — ABNORMAL LOW (ref 12.0–15.0)
Immature Granulocytes: 0 %
Lymphocytes Relative: 31 %
Lymphs Abs: 1.4 10*3/uL (ref 0.7–4.0)
MCH: 26.2 pg (ref 26.0–34.0)
MCHC: 31.4 g/dL (ref 30.0–36.0)
MCV: 83.3 fL (ref 80.0–100.0)
Monocytes Absolute: 0.2 10*3/uL (ref 0.1–1.0)
Monocytes Relative: 5 %
Neutro Abs: 2.8 10*3/uL (ref 1.7–7.7)
Neutrophils Relative %: 62 %
Platelets: 84 10*3/uL — ABNORMAL LOW (ref 150–400)
RBC: 3.9 MIL/uL (ref 3.87–5.11)
RDW: 14.6 % (ref 11.5–15.5)
WBC: 4.6 10*3/uL (ref 4.0–10.5)
nRBC: 0 % (ref 0.0–0.2)

## 2022-08-02 LAB — COMPREHENSIVE METABOLIC PANEL
ALT: 20 U/L (ref 0–44)
AST: 25 U/L (ref 15–41)
Albumin: 3.8 g/dL (ref 3.5–5.0)
Alkaline Phosphatase: 51 U/L (ref 38–126)
Anion gap: 6 (ref 5–15)
BUN: 26 mg/dL — ABNORMAL HIGH (ref 8–23)
CO2: 26 mmol/L (ref 22–32)
Calcium: 8.2 mg/dL — ABNORMAL LOW (ref 8.9–10.3)
Chloride: 109 mmol/L (ref 98–111)
Creatinine, Ser: 0.94 mg/dL (ref 0.44–1.00)
GFR, Estimated: >60 mL/min (ref >60)
Glucose, Bld: 197 mg/dL — ABNORMAL HIGH (ref 70–99)
Potassium: 3.6 mmol/L (ref 3.5–5.1)
Sodium: 141 mmol/L (ref 135–145)
Total Bilirubin: 0.4 mg/dL (ref 0.3–1.2)
Total Protein: 6.5 g/dL (ref 6.5–8.1)

## 2022-08-02 LAB — MAGNESIUM: Magnesium: 1.8 mg/dL (ref 1.7–2.4)

## 2022-08-02 MED ORDER — HEPARIN SOD (PORK) LOCK FLUSH 100 UNIT/ML IV SOLN
500.0000 [IU] | Freq: Once | INTRAVENOUS | Status: AC | PRN
  Administered 2022-08-02: 500 [IU]
  Filled 2022-08-02: qty 5

## 2022-08-02 MED ORDER — TRASTUZUMAB-DKST CHEMO 150 MG IV SOLR
6.0000 mg/kg | Freq: Once | INTRAVENOUS | Status: AC
  Administered 2022-08-02: 504 mg via INTRAVENOUS
  Filled 2022-08-02: qty 24

## 2022-08-02 MED ORDER — SODIUM CHLORIDE 0.9 % IV SOLN
Freq: Once | INTRAVENOUS | Status: AC
  Filled 2022-08-02: qty 250

## 2022-08-02 MED ORDER — SODIUM CHLORIDE 0.9 % IV SOLN
420.0000 mg | Freq: Once | INTRAVENOUS | Status: AC
  Administered 2022-08-02: 420 mg via INTRAVENOUS
  Filled 2022-08-02: qty 14

## 2022-08-02 MED ORDER — DIPHENHYDRAMINE HCL 25 MG PO CAPS
25.0000 mg | ORAL_CAPSULE | Freq: Once | ORAL | Status: AC
  Administered 2022-08-02: 25 mg via ORAL
  Filled 2022-08-02: qty 1

## 2022-08-02 MED ORDER — ACETAMINOPHEN 325 MG PO TABS
650.0000 mg | ORAL_TABLET | Freq: Once | ORAL | Status: AC
  Administered 2022-08-02: 650 mg via ORAL
  Filled 2022-08-02: qty 2

## 2022-08-02 NOTE — Progress Notes (Signed)
Vaccines are updated in [facility] [facility] [employer]

## 2022-08-02 NOTE — Patient Instructions (Signed)
Instrucciones al darle de alta: Discharge Instructions Gracias por elegir al [facility] para brindarle atencin mdica de oncologa y hematologa.  Si usted tiene cita de laboratorio con el [facility], por favor entre por la puerta principal y regstrese en el escritorio central de informacin.   Use ropa cmoda y adecuada para tener fcil acceso a las vas del Portacath (acceso venoso de larga duracin) o la lnea PICC (catter central colocado por va perifrica).   Nos esforzamos por ofrecerle tiempo de calidad con su proveedor. Es posible que tenga que volver a programar su cita si llega tarde (15 minutos o ms).  El llegar tarde le afecta a usted y a otros pacientes cuyas citas son posteriores a la suya.  Adems, si usted falta a tres o ms citas sin avisar a la oficina, puede ser retirado(a) de la clnica a discrecin del proveedor.      Para las solicitudes de renovacin de recetas, pida a su farmacia que se ponga en contacto con nuestra oficina y deje que transcurran 72 horas para que se complete el proceso de las renovaciones.    Hoy usted recibi los siguientes agentes de quimioterapia e/o inmunoterapia       Para ayudar a prevenir las nuseas y los vmitos despus de su tratamiento, le recomendamos que tome su medicamento para las nuseas segn las indicaciones.  LOS SNTOMAS QUE DEBEN COMUNICARSE INMEDIATAMENTE SE INDICAN A CONTINUACIN: *FIEBRE SUPERIOR A 100.4 F (38 C) O MS *ESCALOFROS O SUDORACIN *NUSEAS Y VMITOS QUE NO SE CONTROLAN CON EL MEDICAMENTO PARA LAS NUSEAS *DIFICULTAD INUSUAL PARA RESPIRAR  *MORETONES O HEMORRAGIAS NO HABITUALES *PROBLEMAS URINARIOS (dolor o ardor al orinar o frecuencia para orinar) *PROBLEMAS INTESTINALES (diarrea inusual, estreimiento, dolor cerca del ano) SENSIBILIDAD EN LA BOCA Y EN LA GARGANTA CON O SIN LA PRESENCIA DE LCERAS (dolor de garganta, llagas en la boca o dolor de muelas/dientes) ERUPCIN, HINCHAZN  O DOLORES INUSUALES FLUJO VAGINAL INUSUAL O PICAZN/RASQUIA    Los puntos marcados con un asterisco ( *) indican una posible emergencia y debe hacer un seguimiento tan pronto como le sea posible o vaya al Departamento de Emergencias si se le presenta algn problema.  Por favor, muestre la TARJETA DE ADVERTENCIA DE QUIMIOTERAPIA O LA TARJETA DE ADVERTENCIA DE INMUNOTERAPIA al registrarse en el Departamento de Emergencias y a la enfermera de triaje.  Si tiene preguntas despus de su visita o necesita cancelar o volver a programar su cita, por favor pngase en contacto con [facility] [phone]  y siga las instrucciones. Las horas de oficina son de 8:00 a.m. a 4:30 p.m. de lunes a viernes. Por favor, tenga en cuenta que los mensajes de voz que se dejan despus de las 4:00 p.m. posiblemente no se devolvern hasta el siguiente da de trabajo.  Cerramos los fines de semana y los das festivos importantes. En todo momento tiene acceso a una enfermera para preguntas urgentes. Por favor, llame al nmero principal de la clnica [phone] y siga las instrucciones.   Para cualquier pregunta que no sea de carcter urgente, tambin puede ponerse en contacto con su proveedor utilizando MyChart. Ahora ofrecemos visitas electrnicas para cualquier persona mayor de 18 aos que solicite atencin mdica en lnea para los sntomas que no sean urgentes. Para ms detalles vaya a [email or web address].   Tambin puede bajar la aplicacin de MyChart! Vaya a la tienda de aplicaciones, busque "MyChart", abra la aplicacin, seleccione [place], e ingrese   con su nombre de usuario y la contrasea de MyChart.  Las mscaras son opcionales en los centros de cncer. Si desea que su equipo de cuidados mdicos use una mscara mientras le atienden, por favor hgaselo saber al personal. Puede tener una persona de apoyo que tenga por lo menos 16 aos para que le acompae a sus citas. 

## 2022-08-03 ENCOUNTER — Other Ambulatory Visit: Payer: Self-pay | Admitting: Oncology

## 2022-08-03 DIAGNOSIS — Z170 Estrogen receptor positive status [ER+]: Secondary | ICD-10-CM

## 2022-08-12 ENCOUNTER — Other Ambulatory Visit: Payer: Self-pay | Admitting: Family Medicine

## 2022-08-12 DIAGNOSIS — I152 Hypertension secondary to endocrine disorders: Secondary | ICD-10-CM

## 2022-08-16 ENCOUNTER — Other Ambulatory Visit: Payer: 59

## 2022-08-16 ENCOUNTER — Ambulatory Visit: Payer: 59

## 2022-08-16 ENCOUNTER — Ambulatory Visit: Payer: 59 | Admitting: Oncology

## 2022-08-17 DIAGNOSIS — H903 Sensorineural hearing loss, bilateral: Secondary | ICD-10-CM | POA: Diagnosis not present

## 2022-08-17 DIAGNOSIS — R42 Dizziness and giddiness: Secondary | ICD-10-CM | POA: Diagnosis not present

## 2022-08-17 DIAGNOSIS — H6123 Impacted cerumen, bilateral: Secondary | ICD-10-CM | POA: Diagnosis not present

## 2022-08-21 NOTE — Progress Notes (Signed)
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    INTERVAL HISTORY: Patient returns to clinic today for further evaluation and consideration of cycle 15 of maintenance Herceptin and Perjeta.  She continues to have chronic diarrhea, but otherwise is tolerating her treatments well.  She has no neurologic complaints.  She denies any recent fevers or illnesses.  She has a good appetite and denies weight loss.  She has no chest pain, shortness of breath, cough, or hemoptysis.  She denies any nausea, vomiting, or constipation.  She has no urinary complaints.  Patient offers no further specific complaints today.  REVIEW OF SYSTEMS:   Review of Systems  Constitutional: Negative.  Negative for fever, malaise/fatigue and weight loss.  Respiratory: Negative.  Negative for cough and shortness of breath.   Cardiovascular: Negative.  Negative for chest pain and leg swelling.  Gastrointestinal:  Positive for diarrhea. Negative for abdominal pain.  Genitourinary: Negative.  Negative for dysuria.  Musculoskeletal: Negative.  Negative for back pain.  Skin: Negative.  Negative for rash.  Neurological: Negative.  Negative for dizziness, focal weakness, weakness and headaches.  Psychiatric/Behavioral: Negative.  The patient is not nervous/anxious.     As per HPI. Otherwise, a complete review of systems is negative.  PAST MEDICAL HISTORY: Past Medical History:  Diagnosis Date   Anxiety    Aortic stenosis    Cirrhosis ([place])    Diabetes mellitus    Edema     Heart murmur    Hypertension    Sleep apnea    Thyroid disease     PAST SURGICAL HISTORY: Past Surgical History:  Procedure Laterality Date   ABDOMINAL HYSTERECTOMY  11/17/2013   BREAST BIOPSY     x2   BREAST BIOPSY Left 07/20/2021   [place] Bx, Ribbon Clip, Path pending   BREAST BIOPSY Left 07/20/2021   Stereo Bx, X-clip, path pending   BREAST BIOPSY Left 07/20/2021   Stereo biopsy, coil clip, path pending   BREAST BIOPSY Right 07/20/2021   Stereo Bx, Ribbon Clip, path pending   CESAREAN SECTION     x 2   CHOLECYSTECTOMY  12/17/2001   DUE TO STONES   COLONOSCOPY WITH PROPOFOL N/A 07/26/2022   Procedure: COLONOSCOPY WITH PROPOFOL;  Surgeon: Lin Landsman, MD;  Location: [place];  Service: Gastroenterology;  Laterality: N/A;   DILATION AND CURETTAGE OF UTERUS     ELBOW SURGERY     ESOPHAGOGASTRODUODENOSCOPY (EGD) WITH PROPOFOL N/A 07/26/2022   Procedure: ESOPHAGOGASTRODUODENOSCOPY (EGD) WITH PROPOFOL;  Surgeon: Lin Landsman, MD;  Location: [place];  Service: Gastroenterology;  Laterality: N/A;   POLYPECTOMY  12/17/1993   PORTACATH PLACEMENT Right 10/25/2021   Procedure: INSERTION PORT-A-CATH;  Surgeon: Ronny Bacon, MD;  Location: [facility];  Service: General;  Laterality: Right;   SIMPLE MASTECTOMY WITH AXILLARY SENTINEL NODE BIOPSY Left 08/16/2021   Procedure: SIMPLE MASTECTOMY WITH AXILLARY SENTINEL NODE BIOPSY;  Surgeon: Ronny Bacon, MD;  Location: [facility];  Service: General;  Laterality: Left;    FAMILY HISTORY: Family History  Problem Relation Age of Onset   Hyperlipidemia Mother    Thyroid  disease Mother    Hashimoto's thyroiditis Mother    Heart disease Father        open heart surgery   Heart failure Father    Diabetes Father    Dementia Father    Thyroid disease Brother    Breast cancer Maternal Grandmother    Diabetes Paternal Grandmother    Prostate cancer Paternal Grandfather     ADVANCED DIRECTIVES (Y/N):  N  HEALTH  MAINTENANCE: Social History   Tobacco Use   Smoking status: Former    Packs/day: 1.50    Years: 25.00    Total pack years: 37.50    Types: Cigarettes    Quit date: 11/24/2001    Years since quitting: 20.7   Smokeless tobacco: Never  Vaping Use   Vaping Use: Never used  Substance Use Topics   Alcohol use: Yes    Alcohol/week: 0.0 standard drinks of alcohol    Comment: OCCASIONALLY DRINKS WINE, ONCE A WEEK   Drug use: No     Colonoscopy:  PAP:  Bone density:  Lipid panel:  Allergies  Allergen Reactions   Fosaprepitant Nausea Only and Other (See Comments)    Back pain, nausea. See progress note from 10/27/2021     Current Outpatient Medications  Medication Sig Dispense Refill   amLODipine (NORVASC) 5 MG tablet Take 1 tablet by mouth once daily 90 tablet 0   Ascorbic Acid (VITAMIN C PO) Take 1 tablet by mouth daily.     aspirin 81 MG EC tablet Take 81 mg by mouth at bedtime.     carvedilol (COREG) 25 MG tablet Take 1 tablet by mouth twice daily 180 tablet 0   Cholecalciferol (VITAMIN D3) 1000 UNITS CAPS Take 1,000 Units by mouth at bedtime.     diphenoxylate-atropine (LOMOTIL) 2.5-0.025 MG tablet Take 2 tablets by mouth 4 (four) times daily as needed for diarrhea or loose stools. 60 tablet 1   Ferrous Sulfate (IRON) 325 (65 Fe) MG TABS Take 1 tablet by mouth once daily with breakfast 90 tablet 0   furosemide (LASIX) 40 MG tablet TAKE 1 TABLET BY MOUTH ONCE DAILY AS NEEDED FOR FLUID OR EDEMA 90 tablet 1   glucose blood (CONTOUR NEXT TEST) test strip Test fasting sugar each morning. Recheck if having hypoglycemic symptoms. 100 each 4   glyBURIDE (DIABETA) 5 MG tablet TAKE 1 TABLET BY MOUTH TWICE DAILY WITH MEALS. 180 tablet 3   hydrochlorothiazide (HYDRODIURIL) 25 MG tablet Take 1 tablet (25 mg total) by mouth daily. 90 tablet 3   levothyroxine (EUTHYROX) 75 MCG tablet TAKE 1 TABLET BY MOUTH ONCE DAILY BEFORE BREAKFAST . 90 tablet 1   lisinopril (ZESTRIL) 40 MG tablet Take 1  tablet (40 mg total) by mouth daily. 90 tablet 1   metFORMIN (GLUCOPHAGE) 1000 MG tablet Take 1 tablet (1,000 mg total) by mouth 2 (two) times daily with a meal. 180 tablet 1   Multiple Vitamin (MULTIVITAMIN) tablet Take 1 tablet by mouth every morning.     omeprazole (PRILOSEC) 40 MG capsule Take 1 capsule (40 mg total) by mouth daily before breakfast. 30 capsule 2   PARoxetine (PAXIL) 20 MG tablet Take 1 tablet (20 mg total) by mouth daily. 90 tablet 3   zinc gluconate 50 MG tablet Take 50 mg by mouth daily.     mupirocin ointment (BACTROBAN) 2 % Apply 1 application topically 2 (two) times daily. (Patient not taking: Reported on 08/23/2022) 22 g 0   No current facility-administered medications for  this visit.    OBJECTIVE: Vitals:   08/23/22 0917  BP: 129/70  Pulse: 60  Resp: 16  Temp: 98.9 F (37.2 C)     Body mass index is 31.29 kg/m.    ECOG FS:0 - Asymptomatic  General: Well-developed, well-nourished, no acute distress. Eyes: Pink conjunctiva, anicteric sclera. HEENT: Normocephalic, moist mucous membranes. Lungs: No audible wheezing or coughing. Heart: Regular rate and rhythm. Abdomen: Soft, nontender, no obvious distention. Musculoskeletal: No edema, cyanosis, or clubbing. Neuro: Alert, answering all questions appropriately. Cranial nerves grossly intact. Skin: No rashes or petechiae noted. Psych: Normal affect.  LAB RESULTS:  Lab Results  Component Value Date   NA 141 08/02/2022   K 3.6 08/02/2022   CL 109 08/02/2022   CO2 26 08/02/2022   GLUCOSE 197 (H) 08/02/2022   BUN 26 (H) 08/02/2022   CREATININE 0.94 08/02/2022   CALCIUM 8.2 (L) 08/02/2022   PROT 6.5 08/02/2022   ALBUMIN 3.8 08/02/2022   AST 25 08/02/2022   ALT 20 08/02/2022   ALKPHOS 51 08/02/2022   BILITOT 0.4 08/02/2022   GFRNONAA >60 08/02/2022   GFRAA 111 10/14/2020    Lab Results  Component Value Date   WBC 4.7 08/23/2022   NEUTROABS 2.9 08/23/2022   HGB 10.6 (L) 08/23/2022   HCT 34.1 (L)  08/23/2022   MCV 82.4 08/23/2022   PLT 87 (L) 08/23/2022     STUDIES: No results found.  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  Previously, right breast biopsy was negative for malignancy.  Given stage and HER2 status of disease, initial plan was to give adjuvant chemotherapy using Taxotere, carboplatinum, Perjeta, and Herceptin every 3 weeks for 6 cycles with Udenyca support and then maintenance Herceptin and Perjeta every 3 weeks for an entire year.  She will also benefit from letrozole for 5 years at the conclusion of all of her treatments.  Her most recent cardiac echo on July 16, 2022 reported an EF of 60 to 65% which is unchanged from previous. Patient has had port placement.  Patient only received 3 cycles of treatment, then carboplatinum and Taxotere were discontinued secondary to persistent thrombocytopenia.  Patient was subsequently transitioned to maintenance Perjeta and Herceptin.  Patient last received carboplatinum and Taxotere on December 22, 2021.  Proceed with cycle 15 of maintenance treatment today.  Return to clinic in 3 weeks for treatment only and then in 6 weeks for further evaluation and consideration of cycle 17.  Patient will require repeat cardiac echo in October 2023. Mastectomy wound: Resolved.  Continue follow-up with surgery as scheduled.  Diarrhea: Chronic and unchanged.  Patient was given a prescription for Lomotil today. Leukopenia: Resolved. Thrombocytopenia: Chronic and unchanged.  Patient platelet count is 87.   Fosaprepitant: Patient had an allergic reaction to this premedication and has subsequently been deleted from her treatment regimen. Anemia: Can unchanged.  Patient's hemoglobin is 10.6  today. Hypomagnesia: Exam is 1.5.  Patient was given 2 g IV magnesium today. Hyperglycemia: Chronic and unchanged.  Patient blood sugars remain persistently elevated.  Appreciate primary care input. Liver disease: Patient reports she was recently diagnosed with NASH.   Patient expressed understanding and was in agreement with this plan. She also understands that  She can call clinic at any time with any questions, concerns, or complaints.    Cancer Staging  Carcinoma of left breast [facility]) Staging form: Breast, AJCC 8th Edition - Pathologic stage from 08/30/2021: Stage IB (pT2, pN1a, cM0, G3, ER+, PR+, HER2+, Oncotype DX score: 18) - Signed by Lloyd Huger, MD on 09/13/2021 Stage prefix: Initial diagnosis Multigene prognostic tests performed: Oncotype DX Recurrence score range: Greater than or equal to 11 Histologic grading system: 3 grade system  Lloyd Huger, MD   08/24/2022 2:48 PM

## 2022-08-22 ENCOUNTER — Encounter (INDEPENDENT_AMBULATORY_CARE_PROVIDER_SITE_OTHER): Payer: Self-pay

## 2022-08-23 ENCOUNTER — Inpatient Hospital Stay (HOSPITAL_BASED_OUTPATIENT_CLINIC_OR_DEPARTMENT_OTHER): Payer: 59 | Admitting: Oncology

## 2022-08-23 ENCOUNTER — Inpatient Hospital Stay: Payer: 59

## 2022-08-23 ENCOUNTER — Encounter: Payer: Self-pay | Admitting: Oncology

## 2022-08-23 ENCOUNTER — Inpatient Hospital Stay: Payer: 59 | Attending: Oncology

## 2022-08-23 VITALS — BP 129/70 | HR 60 | Temp 98.9°F | Resp 16 | Ht 64.0 in | Wt 182.3 lb

## 2022-08-23 DIAGNOSIS — Z79890 Hormone replacement therapy (postmenopausal): Secondary | ICD-10-CM | POA: Diagnosis not present

## 2022-08-23 DIAGNOSIS — Z5112 Encounter for antineoplastic immunotherapy: Secondary | ICD-10-CM | POA: Diagnosis not present

## 2022-08-23 DIAGNOSIS — R197 Diarrhea, unspecified: Secondary | ICD-10-CM | POA: Diagnosis not present

## 2022-08-23 DIAGNOSIS — Z833 Family history of diabetes mellitus: Secondary | ICD-10-CM | POA: Diagnosis not present

## 2022-08-23 DIAGNOSIS — Z87891 Personal history of nicotine dependence: Secondary | ICD-10-CM | POA: Insufficient documentation

## 2022-08-23 DIAGNOSIS — D696 Thrombocytopenia, unspecified: Secondary | ICD-10-CM | POA: Diagnosis not present

## 2022-08-23 DIAGNOSIS — Z170 Estrogen receptor positive status [ER+]: Secondary | ICD-10-CM | POA: Diagnosis not present

## 2022-08-23 DIAGNOSIS — D649 Anemia, unspecified: Secondary | ICD-10-CM | POA: Insufficient documentation

## 2022-08-23 DIAGNOSIS — R739 Hyperglycemia, unspecified: Secondary | ICD-10-CM | POA: Insufficient documentation

## 2022-08-23 DIAGNOSIS — Z8249 Family history of ischemic heart disease and other diseases of the circulatory system: Secondary | ICD-10-CM | POA: Insufficient documentation

## 2022-08-23 DIAGNOSIS — Z79899 Other long term (current) drug therapy: Secondary | ICD-10-CM | POA: Diagnosis not present

## 2022-08-23 DIAGNOSIS — K7581 Nonalcoholic steatohepatitis (NASH): Secondary | ICD-10-CM | POA: Insufficient documentation

## 2022-08-23 DIAGNOSIS — Z818 Family history of other mental and behavioral disorders: Secondary | ICD-10-CM | POA: Insufficient documentation

## 2022-08-23 DIAGNOSIS — Z8349 Family history of other endocrine, nutritional and metabolic diseases: Secondary | ICD-10-CM | POA: Insufficient documentation

## 2022-08-23 DIAGNOSIS — Z9049 Acquired absence of other specified parts of digestive tract: Secondary | ICD-10-CM | POA: Insufficient documentation

## 2022-08-23 DIAGNOSIS — I10 Essential (primary) hypertension: Secondary | ICD-10-CM | POA: Insufficient documentation

## 2022-08-23 DIAGNOSIS — K7689 Other specified diseases of liver: Secondary | ICD-10-CM | POA: Insufficient documentation

## 2022-08-23 DIAGNOSIS — Z8042 Family history of malignant neoplasm of prostate: Secondary | ICD-10-CM | POA: Diagnosis not present

## 2022-08-23 DIAGNOSIS — E119 Type 2 diabetes mellitus without complications: Secondary | ICD-10-CM | POA: Insufficient documentation

## 2022-08-23 DIAGNOSIS — C50812 Malignant neoplasm of overlapping sites of left female breast: Secondary | ICD-10-CM | POA: Diagnosis not present

## 2022-08-23 DIAGNOSIS — Z803 Family history of malignant neoplasm of breast: Secondary | ICD-10-CM | POA: Diagnosis not present

## 2022-08-23 LAB — CBC WITH DIFFERENTIAL/PLATELET
Abs Immature Granulocytes: 0.04 10*3/uL (ref 0.00–0.07)
Basophils Absolute: 0 10*3/uL (ref 0.0–0.1)
Basophils Relative: 1 %
Eosinophils Absolute: 0.1 10*3/uL (ref 0.0–0.5)
Eosinophils Relative: 3 %
HCT: 34.1 % — ABNORMAL LOW (ref 36.0–46.0)
Hemoglobin: 10.6 g/dL — ABNORMAL LOW (ref 12.0–15.0)
Immature Granulocytes: 1 %
Lymphocytes Relative: 29 %
Lymphs Abs: 1.4 10*3/uL (ref 0.7–4.0)
MCH: 25.6 pg — ABNORMAL LOW (ref 26.0–34.0)
MCHC: 31.1 g/dL (ref 30.0–36.0)
MCV: 82.4 fL (ref 80.0–100.0)
Monocytes Absolute: 0.2 10*3/uL (ref 0.1–1.0)
Monocytes Relative: 5 %
Neutro Abs: 2.9 10*3/uL (ref 1.7–7.7)
Neutrophils Relative %: 61 %
Platelets: 87 10*3/uL — ABNORMAL LOW (ref 150–400)
RBC: 4.14 MIL/uL (ref 3.87–5.11)
RDW: 14.6 % (ref 11.5–15.5)
WBC: 4.7 10*3/uL (ref 4.0–10.5)
nRBC: 0 % (ref 0.0–0.2)

## 2022-08-23 LAB — MAGNESIUM: Magnesium: 1.5 mg/dL — ABNORMAL LOW (ref 1.7–2.4)

## 2022-08-23 MED ORDER — SODIUM CHLORIDE 0.9 % IV SOLN
420.0000 mg | Freq: Once | INTRAVENOUS | Status: AC
  Administered 2022-08-23: 420 mg via INTRAVENOUS
  Filled 2022-08-23: qty 14

## 2022-08-23 MED ORDER — TRASTUZUMAB-DKST CHEMO 150 MG IV SOLR
6.0000 mg/kg | Freq: Once | INTRAVENOUS | Status: AC
  Administered 2022-08-23: 504 mg via INTRAVENOUS
  Filled 2022-08-23: qty 24

## 2022-08-23 MED ORDER — MAGNESIUM SULFATE 2 GM/50ML IV SOLN
2.0000 g | Freq: Once | INTRAVENOUS | Status: AC
  Administered 2022-08-23: 2 g via INTRAVENOUS
  Filled 2022-08-23: qty 50

## 2022-08-23 MED ORDER — DIPHENHYDRAMINE HCL 25 MG PO CAPS
25.0000 mg | ORAL_CAPSULE | Freq: Once | ORAL | Status: AC
  Administered 2022-08-23: 25 mg via ORAL
  Filled 2022-08-23: qty 1

## 2022-08-23 MED ORDER — SODIUM CHLORIDE 0.9 % IV SOLN
Freq: Once | INTRAVENOUS | Status: AC
  Filled 2022-08-23: qty 250

## 2022-08-23 MED ORDER — ACETAMINOPHEN 325 MG PO TABS
650.0000 mg | ORAL_TABLET | Freq: Once | ORAL | Status: AC
  Administered 2022-08-23: 650 mg via ORAL
  Filled 2022-08-23: qty 2

## 2022-08-23 MED ORDER — HEPARIN SOD (PORK) LOCK FLUSH 100 UNIT/ML IV SOLN
500.0000 [IU] | Freq: Once | INTRAVENOUS | Status: AC | PRN
  Administered 2022-08-23: 500 [IU]
  Filled 2022-08-23: qty 5

## 2022-08-23 NOTE — Patient Instructions (Signed)
[facility] [place]  Discharge Instructions: Thank you for choosing [facility] to provide your oncology and hematology care.  If you have a lab appointment with the Cancer Center, please go directly to the Cancer Center and check in at the registration area.  Wear comfortable clothing and clothing appropriate for easy access to any Portacath or PICC line.   We strive to give you quality time with your provider. You may need to reschedule your appointment if you arrive late (15 or more minutes).  Arriving late affects you and other patients whose appointments are after yours.  Also, if you miss three or more appointments without notifying the office, you may be dismissed from the clinic at the provider's discretion.      For prescription refill requests, have your pharmacy contact our office and allow 72 hours for refills to be completed.    Today you received the following chemotherapy and/or immunotherapy agents- Trastuzumab, Pertuzumab      To help prevent nausea and vomiting after your treatment, we encourage you to take your nausea medication as directed.  BELOW ARE SYMPTOMS THAT SHOULD BE REPORTED IMMEDIATELY: *FEVER GREATER THAN 100.4 F (38 C) OR HIGHER *CHILLS OR SWEATING *NAUSEA AND VOMITING THAT IS NOT CONTROLLED WITH YOUR NAUSEA MEDICATION *UNUSUAL SHORTNESS OF BREATH *UNUSUAL BRUISING OR BLEEDING *URINARY PROBLEMS (pain or burning when urinating, or frequent urination) *BOWEL PROBLEMS (unusual diarrhea, constipation, pain near the anus) TENDERNESS IN MOUTH AND THROAT WITH OR WITHOUT PRESENCE OF ULCERS (sore throat, sores in mouth, or a toothache) UNUSUAL RASH, SWELLING OR PAIN  UNUSUAL VAGINAL DISCHARGE OR ITCHING   Items with * indicate a potential emergency and should be followed up as soon as possible or go to the Emergency Department if any problems should occur.  Please show the CHEMOTHERAPY ALERT CARD or IMMUNOTHERAPY ALERT CARD  at check-in to the Emergency Department and triage nurse.  Should you have questions after your visit or need to cancel or reschedule your appointment, please contact [facility] [place]  [phone] and follow the prompts.  Office hours are 8:00 a.m. to 4:30 p.m. Monday - Friday. Please note that voicemails left after 4:00 p.m. may not be returned until the following business day.  We are closed weekends and major holidays. You have access to a nurse at all times for urgent questions. Please call the main number to the clinic [phone] and follow the prompts.  For any non-urgent questions, you may also contact your provider using MyChart. We now offer e-Visits for anyone 18 and older to request care online for non-urgent symptoms. For details visit [email or web address].   Also download the MyChart app! Go to the app store, search "MyChart", open the app, select [place], and log in with your MyChart username and password.  Masks are optional in the cancer centers. If you would like for your care team to wear a mask while they are taking care of you, please let them know. For doctor visits, patients may have with them one support person who is at least 62 years old. At this time, visitors are not allowed in the infusion area.   

## 2022-08-24 ENCOUNTER — Encounter: Payer: Self-pay | Admitting: Oncology

## 2022-08-24 MED ORDER — DIPHENOXYLATE-ATROPINE 2.5-0.025 MG PO TABS: 2.0000 | ORAL_TABLET | Freq: Four times a day (QID) | ORAL | 1 refills | Status: DC | PRN

## 2022-08-25 ENCOUNTER — Other Ambulatory Visit: Payer: Self-pay | Admitting: Family Medicine

## 2022-08-27 ENCOUNTER — Other Ambulatory Visit: Payer: Self-pay | Admitting: Oncology

## 2022-08-27 ENCOUNTER — Telehealth: Payer: Self-pay | Admitting: Oncology

## 2022-08-27 DIAGNOSIS — Z170 Estrogen receptor positive status [ER+]: Secondary | ICD-10-CM

## 2022-08-27 NOTE — Telephone Encounter (Signed)
Patient called to inquire if her appointment for next infusion should be 2 weeks instead of 3. Her next appointment is 9/21 and she was last her 9/7. Please advise. Thank you

## 2022-08-29 ENCOUNTER — Other Ambulatory Visit: Payer: Self-pay

## 2022-08-29 ENCOUNTER — Other Ambulatory Visit (INDEPENDENT_AMBULATORY_CARE_PROVIDER_SITE_OTHER): Payer: 59

## 2022-08-29 ENCOUNTER — Ambulatory Visit (INDEPENDENT_AMBULATORY_CARE_PROVIDER_SITE_OTHER): Payer: 59 | Admitting: Gastroenterology

## 2022-08-29 ENCOUNTER — Other Ambulatory Visit: Payer: Self-pay | Admitting: Oncology

## 2022-08-29 DIAGNOSIS — Z23 Encounter for immunization: Secondary | ICD-10-CM

## 2022-08-30 ENCOUNTER — Other Ambulatory Visit: Payer: Self-pay

## 2022-08-30 NOTE — Progress Notes (Signed)
Pt here for Twinrix #2. Pt tolerated injection well.

## 2022-09-06 ENCOUNTER — Other Ambulatory Visit: Payer: 59

## 2022-09-06 ENCOUNTER — Ambulatory Visit: Payer: 59

## 2022-09-06 ENCOUNTER — Ambulatory Visit: Payer: 59 | Admitting: Oncology

## 2022-09-07 NOTE — Progress Notes (Signed)
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    INTERVAL HISTORY: Patient returns to clinic today for further evaluation and consideration of cycle 16 of maintenance Herceptin and Perjeta.  She continues to have chronic diarrhea, but otherwise feels well and is tolerating her treatments without significant side effects.  She has no neurologic complaints.  She denies any recent fevers or illnesses.  She has a good appetite and denies weight loss.  She has no chest pain, shortness of breath, cough, or hemoptysis.  She denies any nausea, vomiting, or constipation.  She has no urinary complaints.  Offers no further specific complaints today.  REVIEW OF SYSTEMS:   Review of Systems  Constitutional: Negative.  Negative for fever, malaise/fatigue and weight loss.  Respiratory: Negative.  Negative for cough and shortness of breath.   Cardiovascular: Negative.  Negative for chest pain and leg swelling.  Gastrointestinal:  Positive for diarrhea. Negative for abdominal pain.  Genitourinary: Negative.  Negative for dysuria.  Musculoskeletal: Negative.  Negative for back pain.  Skin: Negative.  Negative for rash.  Neurological: Negative.  Negative for dizziness, focal weakness, weakness and headaches.  Psychiatric/Behavioral: Negative.  The patient is not nervous/anxious.     As per HPI. Otherwise, a complete review of systems is negative.  PAST MEDICAL HISTORY: Past Medical History:  Diagnosis Date   Anxiety    Aortic stenosis    Cirrhosis ([place])    Diabetes mellitus     Edema    Heart murmur    Hypertension    Sleep apnea    Thyroid disease     PAST SURGICAL HISTORY: Past Surgical History:  Procedure Laterality Date   ABDOMINAL HYSTERECTOMY  11/17/2013   BREAST BIOPSY     x2   BREAST BIOPSY Left 07/20/2021   [place] Bx, Ribbon Clip, Path pending   BREAST BIOPSY Left 07/20/2021   Stereo Bx, X-clip, path pending   BREAST BIOPSY Left 07/20/2021   Stereo biopsy, coil clip, path pending   BREAST BIOPSY Right 07/20/2021   Stereo Bx, Ribbon Clip, path pending   CESAREAN SECTION     x 2   CHOLECYSTECTOMY  12/17/2001   DUE TO STONES   COLONOSCOPY WITH PROPOFOL N/A 07/26/2022   Procedure: COLONOSCOPY WITH PROPOFOL;  Surgeon: Lin Landsman, MD;  Location: [facility];  Service: Gastroenterology;  Laterality: N/A;   DILATION AND CURETTAGE OF UTERUS     ELBOW SURGERY     ESOPHAGOGASTRODUODENOSCOPY (EGD) WITH PROPOFOL N/A 07/26/2022   Procedure: ESOPHAGOGASTRODUODENOSCOPY (EGD) WITH PROPOFOL;  Surgeon: Lin Landsman, MD;  Location: [facility];  Service: Gastroenterology;  Laterality: N/A;   POLYPECTOMY  12/17/1993   PORTACATH PLACEMENT Right 10/25/2021   Procedure: INSERTION PORT-A-CATH;  Surgeon: Ronny Bacon, MD;  Location: [facility];  Service: General;  Laterality: Right;   SIMPLE MASTECTOMY WITH AXILLARY SENTINEL NODE BIOPSY Left 08/16/2021   Procedure: SIMPLE MASTECTOMY WITH AXILLARY SENTINEL NODE BIOPSY;  Surgeon: Ronny Bacon, MD;  Location: [facility];  Service: General;  Laterality: Left;    FAMILY HISTORY: Family History  Problem Relation Age of Onset   Hyperlipidemia Mother  Thyroid disease Mother    Hashimoto's thyroiditis Mother    Heart disease Father        open heart surgery   Heart failure Father    Diabetes Father    Dementia Father    Thyroid disease Brother    Breast cancer Maternal Grandmother    Diabetes Paternal Grandmother    Prostate cancer Paternal Grandfather     ADVANCED DIRECTIVES (Y/N):   N  HEALTH MAINTENANCE: Social History   Tobacco Use   Smoking status: Former    Packs/day: 1.50    Years: 25.00    Total pack years: 37.50    Types: Cigarettes    Quit date: 11/24/2001    Years since quitting: 20.8   Smokeless tobacco: Never  Vaping Use   Vaping Use: Never used  Substance Use Topics   Alcohol use: Yes    Alcohol/week: 0.0 standard drinks of alcohol    Comment: OCCASIONALLY DRINKS WINE, ONCE A WEEK   Drug use: No     Colonoscopy:  PAP:  Bone density:  Lipid panel:  Allergies  Allergen Reactions   Fosaprepitant Nausea Only and Other (See Comments)    Back pain, nausea. See progress note from 10/27/2021     Current Outpatient Medications  Medication Sig Dispense Refill   amLODipine (NORVASC) 5 MG tablet Take 1 tablet by mouth once daily 90 tablet 0   Ascorbic Acid (VITAMIN C PO) Take 1 tablet by mouth daily.     aspirin 81 MG EC tablet Take 81 mg by mouth at bedtime.     carvedilol (COREG) 25 MG tablet Take 1 tablet by mouth twice daily 180 tablet 0   Cholecalciferol (VITAMIN D3) 1000 UNITS CAPS Take 1,000 Units by mouth at bedtime.     diphenoxylate-atropine (LOMOTIL) 2.5-0.025 MG tablet Take 2 tablets by mouth 4 (four) times daily as needed for diarrhea or loose stools. 60 tablet 1   Ferrous Sulfate (IRON) 325 (65 Fe) MG TABS Take 1 tablet by mouth once daily with breakfast 90 tablet 0   furosemide (LASIX) 40 MG tablet TAKE 1 TABLET BY MOUTH ONCE DAILY AS NEEDED FOR FLUID OR EDEMA 90 tablet 1   glucose blood (CONTOUR NEXT TEST) test strip Test fasting sugar each morning. Recheck if having hypoglycemic symptoms. 100 each 4   glyBURIDE (DIABETA) 5 MG tablet TAKE 1 TABLET BY MOUTH TWICE DAILY WITH MEALS. 180 tablet 3   hydrochlorothiazide (HYDRODIURIL) 25 MG tablet Take 1 tablet (25 mg total) by mouth daily. 90 tablet 3   levothyroxine (EUTHYROX) 75 MCG tablet TAKE 1 TABLET BY MOUTH ONCE DAILY BEFORE BREAKFAST . 90 tablet 1   lisinopril (ZESTRIL) 40 MG  tablet Take 1 tablet (40 mg total) by mouth daily. 90 tablet 1   metFORMIN (GLUCOPHAGE) 1000 MG tablet Take 1 tablet (1,000 mg total) by mouth 2 (two) times daily with a meal. 180 tablet 1   Multiple Vitamin (MULTIVITAMIN) tablet Take 1 tablet by mouth every morning.     omeprazole (PRILOSEC) 40 MG capsule Take 1 capsule (40 mg total) by mouth daily before breakfast. 30 capsule 2   PARoxetine (PAXIL) 20 MG tablet Take 1 tablet (20 mg total) by mouth daily. 90 tablet 3   zinc gluconate 50 MG tablet Take 50 mg by mouth daily.     mupirocin ointment (BACTROBAN) 2 % Apply 1 application topically 2 (two) times daily. (Patient not taking: Reported on 08/23/2022) 22 g 0   No current facility-administered medications  for this visit.    OBJECTIVE: Vitals:   09/13/22 0848  BP: 135/78  Pulse: 68  Temp: 99.7 F (37.6 C)     Body mass index is 31.58 kg/m.    ECOG FS:0 - Asymptomatic  General: Well-developed, well-nourished, no acute distress. Eyes: Pink conjunctiva, anicteric sclera. HEENT: Normocephalic, moist mucous membranes. Lungs: No audible wheezing or coughing. Heart: Regular rate and rhythm. Abdomen: Soft, nontender, no obvious distention. Musculoskeletal: No edema, cyanosis, or clubbing. Neuro: Alert, answering all questions appropriately. Cranial nerves grossly intact. Skin: No rashes or petechiae noted. Psych: Normal affect.  LAB RESULTS:  Lab Results  Component Value Date   NA 141 08/02/2022   K 3.6 08/02/2022   CL 109 08/02/2022   CO2 26 08/02/2022   GLUCOSE 197 (H) 08/02/2022   BUN 26 (H) 08/02/2022   CREATININE 0.94 08/02/2022   CALCIUM 8.2 (L) 08/02/2022   PROT 6.5 08/02/2022   ALBUMIN 3.8 08/02/2022   AST 25 08/02/2022   ALT 20 08/02/2022   ALKPHOS 51 08/02/2022   BILITOT 0.4 08/02/2022   GFRNONAA >60 08/02/2022   GFRAA 111 10/14/2020    Lab Results  Component Value Date   WBC 5.0 09/13/2022   NEUTROABS 3.1 09/13/2022   HGB 10.5 (L) 09/13/2022   HCT 33.2  (L) 09/13/2022   MCV 81.0 09/13/2022   PLT 93 (L) 09/13/2022     STUDIES: No results found.  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  Previously, right breast biopsy was negative for malignancy.  Given stage and HER2 status of disease, initial plan was to give adjuvant chemotherapy using Taxotere, carboplatinum, Perjeta, and Herceptin every 3 weeks for 6 cycles with Udenyca support and then maintenance Herceptin and Perjeta every 3 weeks for an entire year.  She will also benefit from letrozole for 5 years at the conclusion of all of her treatments.  Her most recent cardiac echo on July 16, 2022 reported an EF of 60 to 65% which is unchanged from previous. Patient has had port placement.  Patient only received 3 cycles of treatment, then carboplatinum and Taxotere were discontinued secondary to persistent thrombocytopenia.  Patient was subsequently transitioned to maintenance Perjeta and Herceptin.  Patient last received carboplatinum and Taxotere on December 22, 2021.  Proceed with cycle 16 of maintenance treatment today.  Return to clinic in 3 weeks for treatment only and then in 6 weeks for further evaluation and consideration of cycle 18 and final treatment.  Patient will require repeat cardiac echo in October 2023. Mastectomy wound: Resolved.  Continue follow-up with surgery as scheduled.  Diarrhea: Chronic and unchanged.  Lomotil as needed.   Leukopenia: Resolved. Thrombocytopenia: Chronic and unchanged.  Patient's platelet count has trended up slightly to 93. Fosaprepitant: Patient had an allergic reaction to this premedication and has subsequently been deleted from her treatment regimen. Anemia: Chronic and unchanged.   Patient's hemoglobin is 10.5. Hypomagnesia: Chronic and unchanged.  Patient's magnesium is 1.5 today.  She is recommended to initiate oral supplementation. Hyperglycemia: Chronic and unchanged.  Patient blood sugars remain persistently elevated.  Appreciate primary care input. Liver disease: Patient reports she was recently diagnosed with NASH.   Patient expressed understanding and was in agreement with this  plan. She also understands that She can call clinic at any time with any questions, concerns, or complaints.    Cancer Staging  Carcinoma of left breast [facility]) Staging form: Breast, AJCC 8th Edition - Pathologic stage from 08/30/2021: Stage IB (pT2, pN1a, cM0, G3, ER+, PR+, HER2+, Oncotype DX score: 18) - Signed by Lloyd Huger, MD on 09/13/2021 Stage prefix: Initial diagnosis Multigene prognostic tests performed: Oncotype DX Recurrence score range: Greater than or equal to 11 Histologic grading system: 3 grade system  Lloyd Huger, MD   09/14/2022 11:45 AM

## 2022-09-10 ENCOUNTER — Other Ambulatory Visit: Payer: Self-pay

## 2022-09-10 DIAGNOSIS — Z170 Estrogen receptor positive status [ER+]: Secondary | ICD-10-CM

## 2022-09-13 ENCOUNTER — Inpatient Hospital Stay: Payer: 59

## 2022-09-13 ENCOUNTER — Ambulatory Visit: Payer: 59

## 2022-09-13 ENCOUNTER — Encounter: Payer: Self-pay | Admitting: Oncology

## 2022-09-13 ENCOUNTER — Ambulatory Visit: Payer: 59 | Admitting: Oncology

## 2022-09-13 ENCOUNTER — Inpatient Hospital Stay (HOSPITAL_BASED_OUTPATIENT_CLINIC_OR_DEPARTMENT_OTHER): Payer: 59 | Admitting: Oncology

## 2022-09-13 ENCOUNTER — Other Ambulatory Visit: Payer: 59

## 2022-09-13 VITALS — BP 135/78 | HR 68 | Temp 99.7°F | Wt 184.0 lb

## 2022-09-13 DIAGNOSIS — Z833 Family history of diabetes mellitus: Secondary | ICD-10-CM | POA: Diagnosis not present

## 2022-09-13 DIAGNOSIS — K7581 Nonalcoholic steatohepatitis (NASH): Secondary | ICD-10-CM | POA: Diagnosis not present

## 2022-09-13 DIAGNOSIS — C50812 Malignant neoplasm of overlapping sites of left female breast: Secondary | ICD-10-CM

## 2022-09-13 DIAGNOSIS — Z5112 Encounter for antineoplastic immunotherapy: Secondary | ICD-10-CM | POA: Diagnosis not present

## 2022-09-13 DIAGNOSIS — E119 Type 2 diabetes mellitus without complications: Secondary | ICD-10-CM | POA: Diagnosis not present

## 2022-09-13 DIAGNOSIS — D649 Anemia, unspecified: Secondary | ICD-10-CM | POA: Diagnosis not present

## 2022-09-13 DIAGNOSIS — Z87891 Personal history of nicotine dependence: Secondary | ICD-10-CM | POA: Diagnosis not present

## 2022-09-13 DIAGNOSIS — Z8249 Family history of ischemic heart disease and other diseases of the circulatory system: Secondary | ICD-10-CM | POA: Diagnosis not present

## 2022-09-13 DIAGNOSIS — D696 Thrombocytopenia, unspecified: Secondary | ICD-10-CM | POA: Diagnosis not present

## 2022-09-13 DIAGNOSIS — Z9049 Acquired absence of other specified parts of digestive tract: Secondary | ICD-10-CM | POA: Diagnosis not present

## 2022-09-13 DIAGNOSIS — Z803 Family history of malignant neoplasm of breast: Secondary | ICD-10-CM | POA: Diagnosis not present

## 2022-09-13 DIAGNOSIS — Z8349 Family history of other endocrine, nutritional and metabolic diseases: Secondary | ICD-10-CM | POA: Diagnosis not present

## 2022-09-13 DIAGNOSIS — Z818 Family history of other mental and behavioral disorders: Secondary | ICD-10-CM | POA: Diagnosis not present

## 2022-09-13 DIAGNOSIS — R739 Hyperglycemia, unspecified: Secondary | ICD-10-CM | POA: Diagnosis not present

## 2022-09-13 DIAGNOSIS — R197 Diarrhea, unspecified: Secondary | ICD-10-CM | POA: Diagnosis not present

## 2022-09-13 DIAGNOSIS — Z8042 Family history of malignant neoplasm of prostate: Secondary | ICD-10-CM | POA: Diagnosis not present

## 2022-09-13 DIAGNOSIS — K7689 Other specified diseases of liver: Secondary | ICD-10-CM | POA: Diagnosis not present

## 2022-09-13 DIAGNOSIS — Z170 Estrogen receptor positive status [ER+]: Secondary | ICD-10-CM

## 2022-09-13 DIAGNOSIS — Z79890 Hormone replacement therapy (postmenopausal): Secondary | ICD-10-CM | POA: Diagnosis not present

## 2022-09-13 DIAGNOSIS — I10 Essential (primary) hypertension: Secondary | ICD-10-CM | POA: Diagnosis not present

## 2022-09-13 DIAGNOSIS — Z79899 Other long term (current) drug therapy: Secondary | ICD-10-CM | POA: Diagnosis not present

## 2022-09-13 LAB — CBC WITH DIFFERENTIAL/PLATELET
Abs Immature Granulocytes: 0.04 10*3/uL (ref 0.00–0.07)
Basophils Absolute: 0 10*3/uL (ref 0.0–0.1)
Basophils Relative: 0 %
Eosinophils Absolute: 0.1 10*3/uL (ref 0.0–0.5)
Eosinophils Relative: 3 %
HCT: 33.2 % — ABNORMAL LOW (ref 36.0–46.0)
Hemoglobin: 10.5 g/dL — ABNORMAL LOW (ref 12.0–15.0)
Immature Granulocytes: 1 %
Lymphocytes Relative: 30 %
Lymphs Abs: 1.5 10*3/uL (ref 0.7–4.0)
MCH: 25.6 pg — ABNORMAL LOW (ref 26.0–34.0)
MCHC: 31.6 g/dL (ref 30.0–36.0)
MCV: 81 fL (ref 80.0–100.0)
Monocytes Absolute: 0.2 10*3/uL (ref 0.1–1.0)
Monocytes Relative: 4 %
Neutro Abs: 3.1 10*3/uL (ref 1.7–7.7)
Neutrophils Relative %: 62 %
Platelets: 93 10*3/uL — ABNORMAL LOW (ref 150–400)
RBC: 4.1 MIL/uL (ref 3.87–5.11)
RDW: 14.6 % (ref 11.5–15.5)
WBC: 5 10*3/uL (ref 4.0–10.5)
nRBC: 0 % (ref 0.0–0.2)

## 2022-09-13 LAB — MAGNESIUM: Magnesium: 1.5 mg/dL — ABNORMAL LOW (ref 1.7–2.4)

## 2022-09-13 MED ORDER — SODIUM CHLORIDE 0.9 % IV SOLN
420.0000 mg | Freq: Once | INTRAVENOUS | Status: AC
  Administered 2022-09-13: 420 mg via INTRAVENOUS
  Filled 2022-09-13: qty 14

## 2022-09-13 MED ORDER — DIPHENHYDRAMINE HCL 25 MG PO CAPS
25.0000 mg | ORAL_CAPSULE | Freq: Once | ORAL | Status: AC
  Administered 2022-09-13: 25 mg via ORAL
  Filled 2022-09-13: qty 1

## 2022-09-13 MED ORDER — SODIUM CHLORIDE 0.9 % IV SOLN
Freq: Once | INTRAVENOUS | Status: AC
  Filled 2022-09-13: qty 250

## 2022-09-13 MED ORDER — HEPARIN SOD (PORK) LOCK FLUSH 100 UNIT/ML IV SOLN
500.0000 [IU] | Freq: Once | INTRAVENOUS | Status: AC | PRN
  Administered 2022-09-13: 500 [IU]
  Filled 2022-09-13: qty 5

## 2022-09-13 MED ORDER — ACETAMINOPHEN 325 MG PO TABS
650.0000 mg | ORAL_TABLET | Freq: Once | ORAL | Status: AC
  Administered 2022-09-13: 650 mg via ORAL
  Filled 2022-09-13: qty 2

## 2022-09-13 MED ORDER — TRASTUZUMAB-DKST CHEMO 150 MG IV SOLR
6.0000 mg/kg | Freq: Once | INTRAVENOUS | Status: AC
  Administered 2022-09-13: 504 mg via INTRAVENOUS
  Filled 2022-09-13: qty 24

## 2022-09-13 NOTE — Patient Instructions (Signed)
[facility] AT [place]  Discharge Instructions: Thank you for choosing [facility] to provide your oncology and hematology care.  If you have a lab appointment with the [facility], please go directly to the [facility] and check in at the registration area.  Wear comfortable clothing and clothing appropriate for easy access to any Portacath or PICC line.   We strive to give you quality time with your provider. You may need to reschedule your appointment if you arrive late (15 or more minutes).  Arriving late affects you and other patients whose appointments are after yours.  Also, if you miss three or more appointments without notifying the office, you may be dismissed from the clinic at the provider's discretion.      For prescription refill requests, have your pharmacy contact our office and allow 72 hours for refills to be completed.    Today you received the following chemotherapy and/or immunotherapy agents OGIVIRI and PERJETA      To help prevent nausea and vomiting after your treatment, we encourage you to take your nausea medication as directed.  BELOW ARE SYMPTOMS THAT SHOULD BE REPORTED IMMEDIATELY: *FEVER GREATER THAN 100.4 F (38 C) OR HIGHER *CHILLS OR SWEATING *NAUSEA AND VOMITING THAT IS NOT CONTROLLED WITH YOUR NAUSEA MEDICATION *UNUSUAL SHORTNESS OF BREATH *UNUSUAL BRUISING OR BLEEDING *URINARY PROBLEMS (pain or burning when urinating, or frequent urination) *BOWEL PROBLEMS (unusual diarrhea, constipation, pain near the anus) TENDERNESS IN MOUTH AND THROAT WITH OR WITHOUT PRESENCE OF ULCERS (sore throat, sores in mouth, or a toothache) UNUSUAL RASH, SWELLING OR PAIN  UNUSUAL VAGINAL DISCHARGE OR ITCHING   Items with * indicate a potential emergency and should be followed up as soon as possible or go to the Emergency Department if any problems should occur.  Please show the CHEMOTHERAPY ALERT CARD or IMMUNOTHERAPY ALERT CARD at  check-in to the Emergency Department and triage nurse.  Should you have questions after your visit or need to cancel or reschedule your appointment, please contact [facility] [place] [facility] [place]  [phone] and follow the prompts.  Office hours are 8:00 a.m. to 4:30 p.m. Monday - Friday. Please note that voicemails left after 4:00 p.m. may not be returned until the following business day.  We are closed weekends and major holidays. You have access to a nurse at all times for urgent questions. Please call the main number to the clinic [phone] and follow the prompts.  For any non-urgent questions, you may also contact your provider using MyChart. We now offer e-Visits for anyone 70 and older to request care online for non-urgent symptoms. For details visit [email or web address].   Also download the MyChart app! Go to the app store, search "MyChart", open the app, select [facility], and log in with your MyChart username and password.  Masks are optional in the cancer centers. If you would like for your care team to wear a mask while they are taking care of you, please let them know. For doctor visits, patients may have with them one support person who is at least 62 years old. At this time, visitors are not allowed in the infusion area.  Trastuzumab Injection What is this medication? TRASTUZUMAB (tras TOO zoo mab) treats breast cancer and stomach cancer. It works by blocking a protein that causes cancer cells to grow and multiply. This helps to slow or stop the spread of cancer cells. This medicine may be used for other purposes; ask your health care  provider or pharmacist if you have questions. COMMON BRAND NAME(S): Herceptin, Janae Bridgeman, Ontruzant, Trazimera What should I tell my care team before I take this medication? They need to know if you have any of these conditions: Heart failure Lung disease An unusual or allergic reaction to trastuzumab, other  medications, foods, dyes, or preservatives Pregnant or trying to get pregnant Breast-feeding How should I use this medication? This medication is injected into a vein. It is given by your care team in a hospital or clinic setting. Talk to your care team about the use of this medication in children. It is not approved for use in children. Overdosage: If you think you have taken too much of this medicine contact a poison control center or emergency room at once. NOTE: This medicine is only for you. Do not share this medicine with others. What if I miss a dose? Keep appointments for follow-up doses. It is important not to miss your dose. Call your care team if you are unable to keep an appointment. What may interact with this medication? Certain types of chemotherapy, such as daunorubicin, doxorubicin, epirubicin, idarubicin This list may not describe all possible interactions. Give your health care provider a list of all the medicines, herbs, non-prescription drugs, or dietary supplements you use. Also tell them if you smoke, drink alcohol, or use illegal drugs. Some items may interact with your medicine. What should I watch for while using this medication? Your condition will be monitored carefully while you are receiving this medication. This medication may make you feel generally unwell. This is not uncommon, as chemotherapy affects healthy cells as well as cancer cells. Report any side effects. Continue your course of treatment even though you feel ill unless your care team tells you to stop. This medication may increase your risk of getting an infection. Call your care team for advice if you get a fever, chills, sore throat, or other symptoms of a cold or flu. Do not treat yourself. Try to avoid being around people who are sick. Avoid taking medications that contain aspirin, acetaminophen, ibuprofen, naproxen, or ketoprofen unless instructed by your care team. These medications can hide a  fever. Talk to your care team if you may be pregnant. Serious birth defects can occur if you take this medication during pregnancy and for 7 months after the last dose. You will need a negative pregnancy test before starting this medication. Contraception is recommended while taking this medication and for 7 months after the last dose. Your care team can help you find the option that works for you. Do not breastfeed while taking this medication and for 7 months after stopping treatment. What side effects may I notice from receiving this medication? Side effects that you should report to your care team as soon as possible: Allergic reactions or angioedema--skin rash, itching or hives, swelling of the face, eyes, lips, tongue, arms, or legs, trouble swallowing or breathing Dry cough, shortness of breath or trouble breathing Heart failure--shortness of breath, swelling of the ankles, feet, or hands, sudden weight gain, unusual weakness or fatigue Infection--fever, chills, cough, or sore throat Infusion reactions--chest pain, shortness of breath or trouble breathing, feeling faint or lightheaded Side effects that usually do not require medical attention (report to your care team if they continue or are bothersome): Diarrhea Dizziness Headache Nausea Trouble sleeping Vomiting This list may not describe all possible side effects. Call your doctor for medical advice about side effects. You may report side effects to FDA  at 1-800-FDA-1088. Where should I keep my medication? This medication is given in a hospital or clinic. It will not be stored at home. NOTE: This sheet is a summary. It may not cover all possible information. If you have questions about this medicine, talk to your doctor, pharmacist, or health care provider.  2023 Elsevier/Gold Standard (2022-04-17 00:00:00)  Pertuzumab Injection What is this medication? PERTUZUMAB (per TOOZ ue mab) treats breast cancer. It works by blocking a  protein that causes cancer cells to grow and multiply. This helps to slow or stop the spread of cancer cells. It is a monoclonal antibody. This medicine may be used for other purposes; ask your health care provider or pharmacist if you have questions. COMMON BRAND NAME(S): PERJETA What should I tell my care team before I take this medication? They need to know if you have any of these conditions: Heart failure An unusual or allergic reaction to pertuzumab, other medications, foods, dyes, or preservatives Pregnant or trying to get pregnant Breast-feeding How should I use this medication? This medication is injected into a vein. It is given by your care team in a hospital or clinic setting. Talk to your care team about the use of this medication in children. Special care may be needed. Overdosage: If you think you have taken too much of this medicine contact a poison control center or emergency room at once. NOTE: This medicine is only for you. Do not share this medicine with others. What if I miss a dose? Keep appointments for follow-up doses. It is important not to miss your dose. Call your care team if you are unable to keep an appointment. What may interact with this medication? Interactions are not expected. This list may not describe all possible interactions. Give your health care provider a list of all the medicines, herbs, non-prescription drugs, or dietary supplements you use. Also tell them if you smoke, drink alcohol, or use illegal drugs. Some items may interact with your medicine. What should I watch for while using this medication? Your condition will be monitored carefully while you are receiving this medication. This medication may make you feel generally unwell. This is not uncommon as chemotherapy can affect healthy cells as well as cancer cells. Report any side effects. Continue your course of treatment even though you feel ill unless your care team tells you to stop. Talk to  your care team if you may be pregnant. Serious birth defects can occur if you take this medication during pregnancy and for 7 months after the last dose. You will need a negative pregnancy test before starting this medication. Contraception is recommended while taking this medication and for 7 months after the last dose. Your care team can help you find the option that works for you. Do not breastfeed while taking this medication and for 7 months after the last dose. What side effects may I notice from receiving this medication? Side effects that you should report to your care team as soon as possible: Allergic reactions or angioedema--skin rash, itching or hives, swelling of the face, eyes, lips, tongue, arms, or legs, trouble swallowing or breathing Heart failure--shortness of breath, swelling of the ankles, feet, or hands, sudden weight gain, unusual weakness or fatigue Infusion reactions--chest pain, shortness of breath or trouble breathing, feeling faint or lightheaded Side effects that usually do not require medical attention (report to your care team if they continue or are bothersome): Diarrhea Dry skin Fatigue Hair loss Nausea Vomiting This list may not  describe all possible side effects. Call your doctor for medical advice about side effects. You may report side effects to FDA at 1-800-FDA-1088. Where should I keep my medication? This medication is given in a hospital or clinic. It will not be stored at home. NOTE: This sheet is a summary. It may not cover all possible information. If you have questions about this medicine, talk to your doctor, pharmacist, or health care provider.  2023 Elsevier/Gold Standard (2022-04-05 00:00:00)

## 2022-09-14 ENCOUNTER — Encounter: Payer: Self-pay | Admitting: Oncology

## 2022-09-27 ENCOUNTER — Other Ambulatory Visit: Payer: 59

## 2022-09-27 ENCOUNTER — Ambulatory Visit: Payer: 59 | Admitting: Oncology

## 2022-09-27 ENCOUNTER — Ambulatory Visit: Payer: 59

## 2022-10-04 ENCOUNTER — Inpatient Hospital Stay: Payer: 59 | Attending: Oncology

## 2022-10-04 ENCOUNTER — Encounter: Payer: Self-pay | Admitting: Oncology

## 2022-10-04 VITALS — BP 130/72 | HR 66 | Temp 98.5°F | Resp 17 | Wt 183.9 lb

## 2022-10-04 DIAGNOSIS — R197 Diarrhea, unspecified: Secondary | ICD-10-CM | POA: Insufficient documentation

## 2022-10-04 DIAGNOSIS — Z5112 Encounter for antineoplastic immunotherapy: Secondary | ICD-10-CM | POA: Insufficient documentation

## 2022-10-04 DIAGNOSIS — Z8042 Family history of malignant neoplasm of prostate: Secondary | ICD-10-CM | POA: Diagnosis not present

## 2022-10-04 DIAGNOSIS — Z87891 Personal history of nicotine dependence: Secondary | ICD-10-CM | POA: Insufficient documentation

## 2022-10-04 DIAGNOSIS — Z79899 Other long term (current) drug therapy: Secondary | ICD-10-CM | POA: Insufficient documentation

## 2022-10-04 DIAGNOSIS — Z8349 Family history of other endocrine, nutritional and metabolic diseases: Secondary | ICD-10-CM | POA: Diagnosis not present

## 2022-10-04 DIAGNOSIS — D696 Thrombocytopenia, unspecified: Secondary | ICD-10-CM | POA: Diagnosis not present

## 2022-10-04 DIAGNOSIS — D649 Anemia, unspecified: Secondary | ICD-10-CM | POA: Insufficient documentation

## 2022-10-04 DIAGNOSIS — Z170 Estrogen receptor positive status [ER+]: Secondary | ICD-10-CM | POA: Insufficient documentation

## 2022-10-04 DIAGNOSIS — R739 Hyperglycemia, unspecified: Secondary | ICD-10-CM | POA: Insufficient documentation

## 2022-10-04 DIAGNOSIS — Z833 Family history of diabetes mellitus: Secondary | ICD-10-CM | POA: Insufficient documentation

## 2022-10-04 DIAGNOSIS — C50812 Malignant neoplasm of overlapping sites of left female breast: Secondary | ICD-10-CM | POA: Diagnosis not present

## 2022-10-04 DIAGNOSIS — Z8249 Family history of ischemic heart disease and other diseases of the circulatory system: Secondary | ICD-10-CM | POA: Insufficient documentation

## 2022-10-04 DIAGNOSIS — Z803 Family history of malignant neoplasm of breast: Secondary | ICD-10-CM | POA: Diagnosis not present

## 2022-10-04 MED ORDER — ACETAMINOPHEN 325 MG PO TABS
650.0000 mg | ORAL_TABLET | Freq: Once | ORAL | Status: AC
  Administered 2022-10-04: 650 mg via ORAL
  Filled 2022-10-04: qty 2

## 2022-10-04 MED ORDER — DIPHENHYDRAMINE HCL 25 MG PO CAPS
25.0000 mg | ORAL_CAPSULE | Freq: Once | ORAL | Status: AC
  Administered 2022-10-04: 25 mg via ORAL
  Filled 2022-10-04: qty 1

## 2022-10-04 MED ORDER — SODIUM CHLORIDE 0.9 % IV SOLN
Freq: Once | INTRAVENOUS | Status: AC
  Filled 2022-10-04: qty 250

## 2022-10-04 MED ORDER — SODIUM CHLORIDE 0.9 % IV SOLN
420.0000 mg | Freq: Once | INTRAVENOUS | Status: AC
  Administered 2022-10-04: 420 mg via INTRAVENOUS
  Filled 2022-10-04: qty 14

## 2022-10-04 MED ORDER — HEPARIN SOD (PORK) LOCK FLUSH 100 UNIT/ML IV SOLN
500.0000 [IU] | Freq: Once | INTRAVENOUS | Status: AC | PRN
  Filled 2022-10-04: qty 5

## 2022-10-04 MED ORDER — HEPARIN SOD (PORK) LOCK FLUSH 100 UNIT/ML IV SOLN
INTRAVENOUS | Status: AC
  Administered 2022-10-04: 500 [IU]
  Filled 2022-10-04: qty 5

## 2022-10-04 MED ORDER — TRASTUZUMAB-DKST CHEMO 150 MG IV SOLR
6.0000 mg/kg | Freq: Once | INTRAVENOUS | Status: AC
  Administered 2022-10-04: 504 mg via INTRAVENOUS
  Filled 2022-10-04: qty 24

## 2022-10-04 NOTE — Patient Instructions (Signed)
[facility]CER CTR AT [place]  Discharge Instructions: Thank you for choosing [facility] to provide your oncology and hematology care.  If you have a lab appointment with the Portal, please go directly to the [facility] and check in at the registration area.  Wear comfortable clothing and clothing appropriate for easy access to any Portacath or PICC line.   We strive to give you quality time with your provider. You may need to reschedule your appointment if you arrive late (15 or more minutes).  Arriving late affects you and other patients whose appointments are after yours.  Also, if you miss three or more appointments without notifying the office, you may be dismissed from the clinic at the provider's discretion.      For prescription refill requests, have your pharmacy contact our office and allow 72 hours for refills to be completed.    Today you received the following chemotherapy and/or immunotherapy agents ogiviri, Perjeta        To help prevent nausea and vomiting after your treatment, we encourage you to take your nausea medication as directed.  BELOW ARE SYMPTOMS THAT SHOULD BE REPORTED IMMEDIATELY: *FEVER GREATER THAN 100.4 F (38 C) OR HIGHER *CHILLS OR SWEATING *NAUSEA AND VOMITING THAT IS NOT CONTROLLED WITH YOUR NAUSEA MEDICATION *UNUSUAL SHORTNESS OF BREATH *UNUSUAL BRUISING OR BLEEDING *URINARY PROBLEMS (pain or burning when urinating, or frequent urination) *BOWEL PROBLEMS (unusual diarrhea, constipation, pain near the anus) TENDERNESS IN MOUTH AND THROAT WITH OR WITHOUT PRESENCE OF ULCERS (sore throat, sores in mouth, or a toothache) UNUSUAL RASH, SWELLING OR PAIN  UNUSUAL VAGINAL DISCHARGE OR ITCHING   Items with * indicate a potential emergency and should be followed up as soon as possible or go to the Emergency Department if any problems should occur.  Please show the CHEMOTHERAPY ALERT CARD or IMMUNOTHERAPY ALERT CARD at  check-in to the Emergency Department and triage nurse.  Should you have questions after your visit or need to cancel or reschedule your appointment, please contact [facility] CANCER [place] AT [place]  [phone] and follow the prompts.  Office hours are 8:00 a.m. to 4:30 p.m. Monday - Friday. Please note that voicemails left after 4:00 p.m. may not be returned until the following business day.  We are closed weekends and major holidays. You have access to a nurse at all times for urgent questions. Please call the main number to the clinic [phone] and follow the prompts.  For any non-urgent questions, you may also contact your provider using MyChart. We now offer e-Visits for anyone 33 and older to request care online for non-urgent symptoms. For details visit [email or web address].   Also download the MyChart app! Go to the app store, search "MyChart", open the app, select [facility], and log in with your MyChart username and password.  Masks are optional in the cancer centers. If you would like for your care team to wear a mask while they are taking care of you, please let them know. For doctor visits, patients may have with them one support person who is at least 62 years old. At this time, visitors are not allowed in the infusion area.

## 2022-10-16 ENCOUNTER — Ambulatory Visit: Payer: 59 | Admitting: Gastroenterology

## 2022-10-21 ENCOUNTER — Other Ambulatory Visit: Payer: Self-pay | Admitting: Gastroenterology

## 2022-10-23 ENCOUNTER — Encounter: Payer: Self-pay | Admitting: Gastroenterology

## 2022-10-23 ENCOUNTER — Ambulatory Visit (INDEPENDENT_AMBULATORY_CARE_PROVIDER_SITE_OTHER): Payer: 59 | Admitting: Gastroenterology

## 2022-10-23 VITALS — BP 120/63 | HR 60 | Temp 99.0°F | Ht 64.0 in | Wt 188.0 lb

## 2022-10-23 DIAGNOSIS — K7460 Unspecified cirrhosis of liver: Secondary | ICD-10-CM | POA: Diagnosis not present

## 2022-10-23 MED ORDER — SPIRONOLACTONE 50 MG PO TABS: 50.0000 mg | ORAL_TABLET | Freq: Every day | ORAL | 0 refills | Status: DC

## 2022-10-23 NOTE — Patient Instructions (Signed)
Start Spirolactone '50mg'$  daily and decrease the Lasix to '20mg'$  daily.  Please come and get labs in 1 week. Our lab is open on Mondays 1pm to 4pm, Tuesdays 8:30am to 4pm. Wednesdays 8:30am to 4pm and thursdays 1pm to 4pm.

## 2022-10-23 NOTE — Progress Notes (Signed)
Cephas Darby, MD [address]  [place]  [place], [place] [address]  Main: [phone]  Fax: [phone]    Gastroenterology Consultation  Referring Provider:     [name], [name] Primary Care Physician:  Pcp, No Primary Gastroenterologist:  Dr. Cephas Darby Reason for Consultation:    Cirrhosis of liver        HPI:   Emily Tapia is a 61 y.o. female referred by Dr. 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  Patient has been experiencing more than 1 year history of chronic nonbloody diarrhea which has been worsening over the last 6 months.  Her diarrheal episodes are mostly in the morning, runny watery, describes on Bristol stool scale as 5-7 and sometimes postprandial.  She denies any nocturnal episodes.  Previously she used to have postprandial urgency only.  It has gotten worse.  She denies any weight loss.  C. difficile was ruled out in 10/2021.  Patient is currently on Lomotil and Imodium as needed through her oncologist.  Patient is frustrated with ongoing diarrheal episodes and she cannot go out.  Patient is also noted to have chronic thrombocytopenia, splenomegaly, mildly elevated transaminases dating back to 2015 until 2019.  She had an MRI abdomen with and without contrast in 03/2017 which revealed diffuse hepatic steatosis, normal pancreas and splenomegaly.  She did not have any further imaging since then.  She also has history of mild normocytic anemia.  She has history of longstanding diabetes, currently on metformin, most recent hemoglobin A1c is 7.4, also with hypothyroidism.  Follow-up visit 07/25/2022 Patient is here to discuss about new diagnosis of cirrhosis of liver.  She underwent imaging for thrombocytopenia, splenomegaly and hepatomegaly as well as for history of postprandial abdominal pain and diarrhea along with  bloating, found to have cirrhosis of liver with mild splenomegaly and small amount of perihepatic ascites.  Follow-up visit 10/23/2022 Patient is here for follow-up of cirrhosis of liver.  Patient reports that she has been experiencing swelling of legs and abdominal bloating, particularly end of the day.  She takes furosemide 40 mg as needed only.  She tries to follow a low-sodium diet.  She does report intermittent rectal bleeding.  She is taking iron supplements for her normocytic anemia.  She does report ongoing diarrhea.  She underwent upper endoscopy and colonoscopy which were unremarkable, GI profile PCR was negative.  Patient reports that she will be finishing her last chemotherapy infusion soon  Patient does not smoke or drink alcohol  NSAIDs: None  Antiplts/Anticoagulants/Anti thrombotics: None  GI Procedures: EGD and colonoscopy 07/26/2022 - Normal duodenal bulb and second portion of the duodenum. Biopsied. - Erosive gastropathy with stigmata of recent bleeding. Biopsied. - Small hiatal hernia. - Benign-appearing esophageal stenosis. - Ectopic gastric mucosa in the upper third of the esophagus. - Esophagogastric landmarks identified. - Normal mid esophagus and distal esophagus.  - The examined portion of the ileum was normal. - Normal mucosa in the left colon and in the right colon. Biopsied. - The distal rectum and anal verge are normal on retroflexion view.  DIAGNOSIS:  A. DUODENUM; COLD BIOPSY:  - UNREMARKABLE DUODENAL MUCOSA.  - NEGATIVE FOR DYSPLASIA AND MALIGNANCY.   B.  STOMACH, RANDOM; COLD BIOPSY:  - MILD CHRONIC AND FOCAL REACTIVE GASTRITIS, NON-SPECIFIC.  - NEGATIVE FOR H. PYLORI, INTESTINAL METAPLASIA, DYSPLASIA, AND  MALIGNANCY.  C. COLON, RIGHT RANDOM; COLD BIOPSY:  - UNREMARKABLE COLONIC MUCOSA.  - NEGATIVE FOR MICROSCOPIC COLITIS, DYSPLASIA, AND MALIGNANCY.   D.  COLON, LEFT RANDOM; COLD BIOPSY:  - UNREMARKABLE COLONIC MUCOSA.  - NEGATIVE FOR  MICROSCOPIC COLITIS, DYSPLASIA, AND MALIGNANCY.    Past Medical History:  Diagnosis Date   Anxiety    Aortic stenosis    Cirrhosis ([place])    Diabetes mellitus    Edema    Heart murmur    Hypertension    Sleep apnea    Thyroid disease     Past Surgical History:  Procedure Laterality Date   ABDOMINAL HYSTERECTOMY  11/17/2013   BREAST BIOPSY     x2   BREAST BIOPSY Left 07/20/2021   [place] Bx, Ribbon Clip, Path pending   BREAST BIOPSY Left 07/20/2021   Stereo Bx, X-clip, path pending   BREAST BIOPSY Left 07/20/2021   Stereo biopsy, coil clip, path pending   BREAST BIOPSY Right 07/20/2021   Stereo Bx, Ribbon Clip, path pending   CESAREAN SECTION     x 2   CHOLECYSTECTOMY  12/17/2001   DUE TO STONES   COLONOSCOPY WITH PROPOFOL N/A 07/26/2022   Procedure: COLONOSCOPY WITH PROPOFOL;  Surgeon: Lin Landsman, MD;  Location: [facility];  Service: Gastroenterology;  Laterality: N/A;   DILATION AND CURETTAGE OF UTERUS     ELBOW SURGERY     ESOPHAGOGASTRODUODENOSCOPY (EGD) WITH PROPOFOL N/A 07/26/2022   Procedure: ESOPHAGOGASTRODUODENOSCOPY (EGD) WITH PROPOFOL;  Surgeon: Lin Landsman, MD;  Location: [facility];  Service: Gastroenterology;  Laterality: N/A;   POLYPECTOMY  12/17/1993   PORTACATH PLACEMENT Right 10/25/2021   Procedure: INSERTION PORT-A-CATH;  Surgeon: Ronny Bacon, MD;  Location: [facility];  Service: General;  Laterality: Right;   SIMPLE MASTECTOMY WITH AXILLARY SENTINEL NODE BIOPSY Left 08/16/2021   Procedure: SIMPLE MASTECTOMY WITH AXILLARY SENTINEL NODE BIOPSY;  Surgeon: Ronny Bacon, MD;  Location: [facility];  Service: General;  Laterality: Left;     Current Outpatient Medications:    amLODipine (NORVASC) 5 MG tablet, Take 1 tablet by mouth once daily, Disp: 90 tablet, Rfl: 0   Ascorbic Acid (VITAMIN C PO), Take 1 tablet by mouth daily., Disp: , Rfl:    aspirin 81 MG EC tablet, Take 81 mg by mouth at bedtime., Disp: , Rfl:    carvedilol (COREG)  25 MG tablet, Take 1 tablet by mouth twice daily, Disp: 180 tablet, Rfl: 0   Cholecalciferol (VITAMIN D3) 1000 UNITS CAPS, Take 1,000 Units by mouth at bedtime., Disp: , Rfl:    diphenoxylate-atropine (LOMOTIL) 2.5-0.025 MG tablet, Take 2 tablets by mouth 4 (four) times daily as needed for diarrhea or loose stools., Disp: 60 tablet, Rfl: 1   Ferrous Sulfate (IRON) 325 (65 Fe) MG TABS, Take 1 tablet by mouth once daily with breakfast, Disp: 90 tablet, Rfl: 0   furosemide (LASIX) 40 MG tablet, TAKE 1 TABLET BY MOUTH ONCE DAILY AS NEEDED FOR FLUID OR EDEMA, Disp: 90 tablet, Rfl: 1   glucose blood (CONTOUR NEXT TEST) test strip, Test fasting sugar each morning. Recheck if having hypoglycemic symptoms., Disp: 100 each, Rfl: 4   glyBURIDE (DIABETA) 5 MG tablet, TAKE 1 TABLET BY MOUTH TWICE DAILY WITH MEALS., Disp: 180 tablet, Rfl: 3   hydrochlorothiazide (HYDRODIURIL) 25 MG tablet, Take 1 tablet (25 mg total) by mouth daily., Disp: 90 tablet, Rfl: 3   levothyroxine (EUTHYROX) 75 MCG tablet, TAKE 1 TABLET BY MOUTH ONCE DAILY BEFORE BREAKFAST ., Disp: 90  tablet, Rfl: 1   lisinopril (ZESTRIL) 40 MG tablet, Take 1 tablet (40 mg total) by mouth daily., Disp: 90 tablet, Rfl: 1   metFORMIN (GLUCOPHAGE) 1000 MG tablet, Take 1 tablet (1,000 mg total) by mouth 2 (two) times daily with a meal., Disp: 180 tablet, Rfl: 1   Multiple Vitamin (MULTIVITAMIN) tablet, Take 1 tablet by mouth every morning., Disp: , Rfl:    mupirocin ointment (BACTROBAN) 2 %, Apply 1 application topically 2 (two) times daily., Disp: 22 g, Rfl: 0   omeprazole (PRILOSEC) 40 MG capsule, TAKE 1 CAPSULE BY MOUTH ONCE DAILY BEFORE BREAKFAST, Disp: 30 capsule, Rfl: 2   PARoxetine (PAXIL) 20 MG tablet, Take 1 tablet (20 mg total) by mouth daily., Disp: 90 tablet, Rfl: 3   spironolactone (ALDACTONE) 50 MG tablet, Take 1 tablet (50 mg total) by mouth daily., Disp: 30 tablet, Rfl: 0   zinc gluconate 50 MG tablet, Take 50 mg by mouth daily., Disp: , Rfl:     Family History  Problem Relation Age of Onset   Hyperlipidemia Mother    Thyroid disease Mother    Hashimoto's thyroiditis Mother    Heart disease Father        open heart surgery   Heart failure Father    Diabetes Father    Dementia Father    Thyroid disease Brother    Breast cancer Maternal Grandmother    Diabetes Paternal Grandmother    Prostate cancer Paternal Grandfather      Social History   Tobacco Use   Smoking status: Former    Packs/day: 1.50    Years: 25.00    Total pack years: 37.50    Types: Cigarettes    Quit date: 11/24/2001    Years since quitting: 20.9   Smokeless tobacco: Never  Vaping Use   Vaping Use: Never used  Substance Use Topics   Alcohol use: Yes    Alcohol/week: 0.0 standard drinks of alcohol    Comment: OCCASIONALLY DRINKS WINE, ONCE A WEEK   Drug use: No    Allergies as of 10/23/2022 - Review Complete 09/13/2022  Allergen Reaction Noted   Fosaprepitant Nausea Only and Other (See Comments) 10/27/2021    Review of Systems:    All systems reviewed and negative except where noted in HPI.   Physical Exam:  BP 120/63 (BP Location: Left Arm, Patient Position: Sitting, Cuff Size: Normal)   Pulse 60   Temp 99 F (37.2 C) (Oral)   Ht _0  (1.626 m)   Wt 188 lb (85.3 kg)   BMI 32.27 kg/m  No LMP recorded. Patient has had a hysterectomy.  General:   Alert,  Well-developed, well-nourished, pleasant and cooperative in NAD Head:  Normocephalic and atraumatic. Eyes:  Sclera clear, no icterus.   Conjunctiva pink. Ears:  Normal auditory acuity. Nose:  No deformity, discharge, or lesions. Mouth:  No deformity or lesions,oropharynx pink & moist. Neck:  Supple; no masses or thyromegaly. Lungs:  Respirations even and unlabored.  Clear throughout to auscultation.   No wheezes, crackles, or rhonchi. No acute distress. Heart:  Regular rate and rhythm; no murmurs, clicks, rubs, or gallops. Abdomen:  Normal bowel sounds. Soft, obese, moderately  distended, hepatomegaly, nontender, palpable spleen, diastases recti, no guarding or rebound tenderness.   Rectal: Not performed Msk:  Symmetrical without gross deformities. Good, equal movement & strength bilaterally. Pulses:  Normal pulses noted. Extremities:  No clubbing, has 1+ edema.  No cyanosis. Neurologic:  Alert and oriented x3;  grossly normal neurologically. Skin:  Intact without significant lesions or rashes. No jaundice. Psych:  Alert and cooperative. Normal mood and affect.  Imaging Studies: Reviewed  Assessment and Plan:   Emily Tapia is a 61 y.o. female with metabolic syndrome, history of fatty liver, breast cancer, currently on chemotherapy, chronic diarrhea, cirrhosis of liver  Chronic diarrhea:  Perjeta is associated with diarrhea in 15 to 67% of the cases GI profile PCR is negative for infection Upper endoscopy and colonoscopy for further evaluation with gastric, duodenal biopsies, TI and random colon biopsies were all unremarkable  Cirrhosis of liver secondary to metabolic syndrome resulting in fatty liver Serum ferritin levels normal viral hepatitis panel and rest of the secondary liver disease workup were negative Patient is mildly hypervolemic, recommend low-sodium diet, information provided Recommend to start Lasix 20 mg and spironolactone 50 mg daily Check BMP in 1 week and increase the dose as necessary EGD did not reveal any varices HCC: Normal serum AFP, no evidence of liver lesions based on the CT scan in 07/2022, recommend right upper quadrant ultrasound in 6 months PSE: None HRS: None    Follow up in 6 months   Cephas Darby, MD

## 2022-10-25 ENCOUNTER — Inpatient Hospital Stay: Payer: 59 | Attending: Oncology

## 2022-10-25 ENCOUNTER — Encounter: Payer: Self-pay | Admitting: Oncology

## 2022-10-25 ENCOUNTER — Inpatient Hospital Stay: Payer: 59

## 2022-10-25 ENCOUNTER — Inpatient Hospital Stay (HOSPITAL_BASED_OUTPATIENT_CLINIC_OR_DEPARTMENT_OTHER): Payer: 59 | Admitting: Oncology

## 2022-10-25 DIAGNOSIS — Z8349 Family history of other endocrine, nutritional and metabolic diseases: Secondary | ICD-10-CM | POA: Diagnosis not present

## 2022-10-25 DIAGNOSIS — Z818 Family history of other mental and behavioral disorders: Secondary | ICD-10-CM | POA: Insufficient documentation

## 2022-10-25 DIAGNOSIS — C50812 Malignant neoplasm of overlapping sites of left female breast: Secondary | ICD-10-CM

## 2022-10-25 DIAGNOSIS — D696 Thrombocytopenia, unspecified: Secondary | ICD-10-CM | POA: Diagnosis not present

## 2022-10-25 DIAGNOSIS — R197 Diarrhea, unspecified: Secondary | ICD-10-CM | POA: Diagnosis not present

## 2022-10-25 DIAGNOSIS — Z803 Family history of malignant neoplasm of breast: Secondary | ICD-10-CM | POA: Diagnosis not present

## 2022-10-25 DIAGNOSIS — Z79811 Long term (current) use of aromatase inhibitors: Secondary | ICD-10-CM | POA: Diagnosis not present

## 2022-10-25 DIAGNOSIS — C779 Secondary and unspecified malignant neoplasm of lymph node, unspecified: Secondary | ICD-10-CM | POA: Diagnosis not present

## 2022-10-25 DIAGNOSIS — Z8249 Family history of ischemic heart disease and other diseases of the circulatory system: Secondary | ICD-10-CM | POA: Diagnosis not present

## 2022-10-25 DIAGNOSIS — Z79899 Other long term (current) drug therapy: Secondary | ICD-10-CM | POA: Diagnosis not present

## 2022-10-25 DIAGNOSIS — Z833 Family history of diabetes mellitus: Secondary | ICD-10-CM | POA: Diagnosis not present

## 2022-10-25 DIAGNOSIS — Z170 Estrogen receptor positive status [ER+]: Secondary | ICD-10-CM | POA: Diagnosis not present

## 2022-10-25 DIAGNOSIS — Z87891 Personal history of nicotine dependence: Secondary | ICD-10-CM | POA: Insufficient documentation

## 2022-10-25 DIAGNOSIS — D649 Anemia, unspecified: Secondary | ICD-10-CM | POA: Insufficient documentation

## 2022-10-25 DIAGNOSIS — Z8042 Family history of malignant neoplasm of prostate: Secondary | ICD-10-CM | POA: Diagnosis not present

## 2022-10-25 DIAGNOSIS — K7581 Nonalcoholic steatohepatitis (NASH): Secondary | ICD-10-CM | POA: Insufficient documentation

## 2022-10-25 DIAGNOSIS — Z5112 Encounter for antineoplastic immunotherapy: Secondary | ICD-10-CM | POA: Diagnosis not present

## 2022-10-25 DIAGNOSIS — R739 Hyperglycemia, unspecified: Secondary | ICD-10-CM | POA: Diagnosis not present

## 2022-10-25 DIAGNOSIS — Z9049 Acquired absence of other specified parts of digestive tract: Secondary | ICD-10-CM | POA: Insufficient documentation

## 2022-10-25 LAB — MAGNESIUM: Magnesium: 1.7 mg/dL (ref 1.7–2.4)

## 2022-10-25 MED ORDER — TRASTUZUMAB-DKST CHEMO 150 MG IV SOLR
6.0000 mg/kg | Freq: Once | INTRAVENOUS | Status: AC
  Administered 2022-10-25: 504 mg via INTRAVENOUS
  Filled 2022-10-25: qty 24

## 2022-10-25 MED ORDER — DIPHENHYDRAMINE HCL 25 MG PO CAPS
25.0000 mg | ORAL_CAPSULE | Freq: Once | ORAL | Status: AC
  Administered 2022-10-25: 25 mg via ORAL
  Filled 2022-10-25: qty 1

## 2022-10-25 MED ORDER — SODIUM CHLORIDE 0.9 % IV SOLN
420.0000 mg | Freq: Once | INTRAVENOUS | Status: AC
  Administered 2022-10-25: 420 mg via INTRAVENOUS
  Filled 2022-10-25: qty 14

## 2022-10-25 MED ORDER — LETROZOLE 2.5 MG PO TABS: 2.5000 mg | ORAL_TABLET | Freq: Every day | ORAL | 3 refills | Status: DC

## 2022-10-25 MED ORDER — SODIUM CHLORIDE 0.9% FLUSH
10.0000 mL | INTRAVENOUS | Status: DC | PRN
  Administered 2022-10-25: 10 mL
  Filled 2022-10-25: qty 10

## 2022-10-25 MED ORDER — HEPARIN SOD (PORK) LOCK FLUSH 100 UNIT/ML IV SOLN
500.0000 [IU] | Freq: Once | INTRAVENOUS | Status: AC | PRN
  Administered 2022-10-25: 500 [IU]
  Filled 2022-10-25: qty 5

## 2022-10-25 MED ORDER — SODIUM CHLORIDE 0.9 % IV SOLN
Freq: Once | INTRAVENOUS | Status: AC
  Filled 2022-10-25: qty 250

## 2022-10-25 MED ORDER — ACETAMINOPHEN 325 MG PO TABS
650.0000 mg | ORAL_TABLET | Freq: Once | ORAL | Status: AC
  Administered 2022-10-25: 650 mg via ORAL
  Filled 2022-10-25: qty 2

## 2022-10-25 NOTE — Progress Notes (Signed)
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    INTERVAL HISTORY: Patient returns to clinic today for further evaluation and consideration of cycle 18 of maintenance Herceptin and Perjeta.  She has chronic, intermittent diarrhea but is otherwise tolerating her treatments well.  She has no neurologic complaints.  She denies any recent fevers or illnesses.  She has a good appetite and denies weight loss.  She has no chest pain, shortness of breath, cough, or hemoptysis.  She denies any nausea, vomiting, or constipation.  She has no urinary complaints.  Patient offers no further specific complaints today.  REVIEW OF SYSTEMS:   Review of Systems  Constitutional: Negative.  Negative for fever, malaise/fatigue and weight loss.  Respiratory: Negative.  Negative for cough and shortness of breath.   Cardiovascular: Negative.  Negative for chest pain and leg swelling.  Gastrointestinal:  Positive for diarrhea. Negative for abdominal pain.  Genitourinary: Negative.  Negative for dysuria.  Musculoskeletal: Negative.  Negative for back pain.  Skin: Negative.  Negative for rash.  Neurological: Negative.  Negative for dizziness, focal weakness, weakness and headaches.  Psychiatric/Behavioral: Negative.  The patient is not nervous/anxious.     As per HPI. Otherwise, a complete review of systems is negative.  PAST MEDICAL HISTORY: Past Medical History:  Diagnosis Date   Anxiety    Aortic stenosis    Cirrhosis ([employer])    Diabetes mellitus    Edema    Heart murmur     Hypertension    Sleep apnea    Thyroid disease     PAST SURGICAL HISTORY: Past Surgical History:  Procedure Laterality Date   ABDOMINAL HYSTERECTOMY  11/17/2013   BREAST BIOPSY     x2   BREAST BIOPSY Left 07/20/2021   [place] Bx, Ribbon Clip, Path pending   BREAST BIOPSY Left 07/20/2021   Stereo Bx, X-clip, path pending   BREAST BIOPSY Left 07/20/2021   Stereo biopsy, coil clip, path pending   BREAST BIOPSY Right 07/20/2021   Stereo Bx, Ribbon Clip, path pending   CESAREAN SECTION     x 2   CHOLECYSTECTOMY  12/17/2001   DUE TO STONES   COLONOSCOPY WITH PROPOFOL N/A 07/26/2022   Procedure: COLONOSCOPY WITH PROPOFOL;  Surgeon: Lin Landsman, MD;  Location: [facility];  Service: Gastroenterology;  Laterality: N/A;   DILATION AND CURETTAGE OF UTERUS     ELBOW SURGERY     ESOPHAGOGASTRODUODENOSCOPY (EGD) WITH PROPOFOL N/A 07/26/2022   Procedure: ESOPHAGOGASTRODUODENOSCOPY (EGD) WITH PROPOFOL;  Surgeon: Lin Landsman, MD;  Location: [facility];  Service: Gastroenterology;  Laterality: N/A;   POLYPECTOMY  12/17/1993   PORTACATH PLACEMENT Right 10/25/2021   Procedure: INSERTION PORT-A-CATH;  Surgeon: Ronny Bacon, MD;  Location: [facility];  Service: General;  Laterality: Right;   SIMPLE MASTECTOMY WITH AXILLARY SENTINEL NODE BIOPSY Left 08/16/2021   Procedure: SIMPLE MASTECTOMY WITH AXILLARY SENTINEL NODE BIOPSY;  Surgeon: Ronny Bacon, MD;  Location: [facility];  Service: General;  Laterality: Left;    FAMILY HISTORY: Family History  Problem Relation Age of Onset   Hyperlipidemia Mother    Thyroid disease Mother  Hashimoto's thyroiditis Mother    Heart disease Father        open heart surgery   Heart failure Father    Diabetes Father    Dementia Father    Thyroid disease Brother    Breast cancer Maternal Grandmother    Diabetes Paternal Grandmother    Prostate cancer Paternal Grandfather     ADVANCED DIRECTIVES (Y/N):  N  HEALTH MAINTENANCE: Social  History   Tobacco Use   Smoking status: Former    Packs/day: 1.50    Years: 25.00    Total pack years: 37.50    Types: Cigarettes    Quit date: 11/24/2001    Years since quitting: 20.9   Smokeless tobacco: Never  Vaping Use   Vaping Use: Never used  Substance Use Topics   Alcohol use: Yes    Alcohol/week: 0.0 standard drinks of alcohol    Comment: OCCASIONALLY DRINKS WINE, ONCE A WEEK   Drug use: No     Colonoscopy:  PAP:  Bone density:  Lipid panel:  Allergies  Allergen Reactions   Fosaprepitant Nausea Only and Other (See Comments)    Back pain, nausea. See progress note from 10/27/2021     Current Outpatient Medications  Medication Sig Dispense Refill   amLODipine (NORVASC) 5 MG tablet Take 1 tablet by mouth once daily 90 tablet 0   Ascorbic Acid (VITAMIN C PO) Take 1 tablet by mouth daily.     aspirin 81 MG EC tablet Take 81 mg by mouth at bedtime.     carvedilol (COREG) 25 MG tablet Take 1 tablet by mouth twice daily 180 tablet 0   Cholecalciferol (VITAMIN D3) 1000 UNITS CAPS Take 1,000 Units by mouth at bedtime.     diphenoxylate-atropine (LOMOTIL) 2.5-0.025 MG tablet Take 2 tablets by mouth 4 (four) times daily as needed for diarrhea or loose stools. 60 tablet 1   Ferrous Sulfate (IRON) 325 (65 Fe) MG TABS Take 1 tablet by mouth once daily with breakfast 90 tablet 0   furosemide (LASIX) 40 MG tablet TAKE 1 TABLET BY MOUTH ONCE DAILY AS NEEDED FOR FLUID OR EDEMA 90 tablet 1   glucose blood (CONTOUR NEXT TEST) test strip Test fasting sugar each morning. Recheck if having hypoglycemic symptoms. 100 each 4   glyBURIDE (DIABETA) 5 MG tablet TAKE 1 TABLET BY MOUTH TWICE DAILY WITH MEALS. 180 tablet 3   hydrochlorothiazide (HYDRODIURIL) 25 MG tablet Take 1 tablet (25 mg total) by mouth daily. 90 tablet 3   letrozole (FEMARA) 2.5 MG tablet Take 1 tablet (2.5 mg total) by mouth daily. 90 tablet 3   levothyroxine (EUTHYROX) 75 MCG tablet TAKE 1 TABLET BY MOUTH ONCE DAILY  BEFORE BREAKFAST . 90 tablet 1   lisinopril (ZESTRIL) 40 MG tablet Take 1 tablet (40 mg total) by mouth daily. 90 tablet 1   metFORMIN (GLUCOPHAGE) 1000 MG tablet Take 1 tablet (1,000 mg total) by mouth 2 (two) times daily with a meal. 180 tablet 1   Multiple Vitamin (MULTIVITAMIN) tablet Take 1 tablet by mouth every morning.     mupirocin ointment (BACTROBAN) 2 % Apply 1 application topically 2 (two) times daily. 22 g 0   omeprazole (PRILOSEC) 40 MG capsule TAKE 1 CAPSULE BY MOUTH ONCE DAILY BEFORE BREAKFAST 30 capsule 2   PARoxetine (PAXIL) 20 MG tablet Take 1 tablet (20 mg total) by mouth daily. 90 tablet 3   spironolactone (ALDACTONE) 50 MG tablet Take 1 tablet (50 mg total) by mouth daily.  30 tablet 0   zinc gluconate 50 MG tablet Take 50 mg by mouth daily.     No current facility-administered medications for this visit.    OBJECTIVE: Vitals:   10/25/22 0839  BP: 138/63  Pulse: 69  Resp: 16  Temp: 98.6 F (37 C)  SpO2: 96%     Body mass index is 32.1 kg/m.    ECOG FS:0 - Asymptomatic  General: Well-developed, well-nourished, no acute distress. Eyes: Pink conjunctiva, anicteric sclera. HEENT: Normocephalic, moist mucous membranes. Lungs: No audible wheezing or coughing. Heart: Regular rate and rhythm. Abdomen: Soft, nontender, no obvious distention. Musculoskeletal: No edema, cyanosis, or clubbing. Neuro: Alert, answering all questions appropriately. Cranial nerves grossly intact. Skin: No rashes or petechiae noted. Psych: Normal affect.  LAB RESULTS:  Lab Results  Component Value Date   NA 141 08/02/2022   K 3.6 08/02/2022   CL 109 08/02/2022   CO2 26 08/02/2022   GLUCOSE 197 (H) 08/02/2022   BUN 26 (H) 08/02/2022   CREATININE 0.94 08/02/2022   CALCIUM 8.2 (L) 08/02/2022   PROT 6.5 08/02/2022   ALBUMIN 3.8 08/02/2022   AST 25 08/02/2022   ALT 20 08/02/2022   ALKPHOS 51 08/02/2022   BILITOT 0.4 08/02/2022   GFRNONAA >60 08/02/2022   GFRAA 111 10/14/2020     Lab Results  Component Value Date   WBC 5.0 09/13/2022   NEUTROABS 3.1 09/13/2022   HGB 10.5 (L) 09/13/2022   HCT 33.2 (L) 09/13/2022   MCV 81.0 09/13/2022   PLT 93 (L) 09/13/2022     STUDIES: No results found.  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  Previously, right breast biopsy was negative for malignancy.  Given stage and HER2 status of disease, initial plan was to give adjuvant chemotherapy using Taxotere, carboplatinum, Perjeta, and Herceptin every 3 weeks for 6 cycles with Udenyca support and then maintenance Herceptin and Perjeta every 3 weeks for an entire year.  She will also benefit from letrozole for 5 years at the conclusion of all of her treatments.  Her most recent cardiac echo on July 16, 2022 reported an EF of 60 to 65% which is unchanged from previous. Patient has had port placement.  Patient only received 3 cycles of treatment, then carboplatinum and Taxotere were discontinued secondary to persistent thrombocytopenia.  Patient was subsequently transitioned to maintenance Perjeta and Herceptin.  Patient last received carboplatinum and Taxotere on December 22, 2021.  Proceed with cycle 18 of maintenance treatment today.  Patient will require mammogram and bone mineral density in the next several weeks.  Continue letrozole completing in November 2028.  Return to clinic in 3 months for routine evaluation.  Mastectomy wound: Resolved.   Diarrhea: Chronic and unchanged.  Continue Lomotil as needed.   Leukopenia: Resolved. Thrombocytopenia: Chronic and unchanged.  Patient's most recent platelet count is 93.   Fosaprepitant: Patient had an allergic reaction to this premedication and has subsequently been deleted  from her treatment regimen. Anemia: Chronic and unchanged.  Patient's most recent hemoglobin is 10.5. Hypomagnesia: Resolved.  Patient's potassium is 1.7.  Continue oral supplementation. Hyperglycemia: Chronic and unchanged.  Patient blood sugars remain persistently elevated.  Appreciate primary care input. Liver disease: Patient reports she  was recently diagnosed with NASH.   Patient expressed understanding and was in agreement with this plan. She also understands that She can call clinic at any time with any questions, concerns, or complaints.    Cancer Staging  Carcinoma of left breast [facility]) Staging form: Breast, AJCC 8th Edition - Pathologic stage from 08/30/2021: Stage IB (pT2, pN1a, cM0, G3, ER+, PR+, HER2+, Oncotype DX score: 18) - Signed by Lloyd Huger, MD on 09/13/2021 Stage prefix: Initial diagnosis Multigene prognostic tests performed: Oncotype DX Recurrence score range: Greater than or equal to 11 Histologic grading system: 3 grade system  Lloyd Huger, MD   10/26/2022 6:15 AM

## 2022-10-25 NOTE — Patient Instructions (Signed)
[facility] AT [place]  Discharge Instructions: Thank you for choosing [facility] to provide your oncology and hematology care.  If you have a lab appointment with the [facility], please go directly to the [facility] and check in at the registration area.  Wear comfortable clothing and clothing appropriate for easy access to any Portacath or PICC line.   We strive to give you quality time with your provider. You may need to reschedule your appointment if you arrive late (15 or more minutes).  Arriving late affects you and other patients whose appointments are after yours.  Also, if you miss three or more appointments without notifying the office, you may be dismissed from the clinic at the provider's discretion.      For prescription refill requests, have your pharmacy contact our office and allow 72 hours for refills to be completed.    Today you received the following chemotherapy and/or immunotherapy agents- Trastuzumab, Pertuzumab      To help prevent nausea and vomiting after your treatment, we encourage you to take your nausea medication as directed.  BELOW ARE SYMPTOMS THAT SHOULD BE REPORTED IMMEDIATELY: *FEVER GREATER THAN 100.4 F (38 C) OR HIGHER *CHILLS OR SWEATING *NAUSEA AND VOMITING THAT IS NOT CONTROLLED WITH YOUR NAUSEA MEDICATION *UNUSUAL SHORTNESS OF BREATH *UNUSUAL BRUISING OR BLEEDING *URINARY PROBLEMS (pain or burning when urinating, or frequent urination) *BOWEL PROBLEMS (unusual diarrhea, constipation, pain near the anus) TENDERNESS IN MOUTH AND THROAT WITH OR WITHOUT PRESENCE OF ULCERS (sore throat, sores in mouth, or a toothache) UNUSUAL RASH, SWELLING OR PAIN  UNUSUAL VAGINAL DISCHARGE OR ITCHING   Items with * indicate a potential emergency and should be followed up as soon as possible or go to the Emergency Department if any problems should occur.  Please show the CHEMOTHERAPY ALERT CARD or IMMUNOTHERAPY ALERT CARD  at check-in to the Emergency Department and triage nurse.  Should you have questions after your visit or need to cancel or reschedule your appointment, please contact [facility] [place] AT [place]  [phone] and follow the prompts.  Office hours are 8:00 a.m. to 4:30 p.m. Monday - Friday. Please note that voicemails left after 4:00 p.m. may not be returned until the following business day.  We are closed weekends and major holidays. You have access to a nurse at all times for urgent questions. Please call the main number to the clinic [phone] and follow the prompts.  For any non-urgent questions, you may also contact your provider using MyChart. We now offer e-Visits for anyone 73 and older to request care online for non-urgent symptoms. For details visit [email or web address].   Also download the MyChart app! Go to the app store, search "MyChart", open the app, select , and log in with your MyChart username and password.  Masks are optional in the cancer centers. If you would like for your care team to wear a mask while they are taking care of you, please let them know. For doctor visits, patients may have with them one support person who is at least 62 years old. At this time, visitors are not allowed in the infusion area.

## 2022-10-26 ENCOUNTER — Other Ambulatory Visit: Payer: Self-pay

## 2022-10-26 ENCOUNTER — Encounter: Payer: Self-pay | Admitting: Oncology

## 2022-11-07 ENCOUNTER — Ambulatory Visit (INDEPENDENT_AMBULATORY_CARE_PROVIDER_SITE_OTHER): Payer: 59

## 2022-11-07 ENCOUNTER — Ambulatory Visit
Admission: EM | Admit: 2022-11-07 | Discharge: 2022-11-07 | Disposition: A | Discharge status: Home / Self Care | Payer: 59 | Attending: Family Medicine | Admitting: Family Medicine

## 2022-11-07 DIAGNOSIS — Z23 Encounter for immunization: Secondary | ICD-10-CM | POA: Diagnosis not present

## 2022-11-07 DIAGNOSIS — T148XXA Other injury of unspecified body region, initial encounter: Secondary | ICD-10-CM | POA: Diagnosis not present

## 2022-11-07 DIAGNOSIS — M7989 Other specified soft tissue disorders: Secondary | ICD-10-CM | POA: Diagnosis not present

## 2022-11-07 DIAGNOSIS — M79641 Pain in right hand: Secondary | ICD-10-CM

## 2022-11-07 MED ORDER — TRAMADOL HCL 50 MG PO TABS: 50.0000 mg | ORAL_TABLET | Freq: Four times a day (QID) | ORAL | 0 refills | Status: DC | PRN

## 2022-11-07 MED ORDER — TETANUS-DIPHTH-ACELL PERTUSSIS 5-2.5-18.5 LF-MCG/0.5 IM SUSY
0.5000 mL | PREFILLED_SYRINGE | Freq: Once | INTRAMUSCULAR | Status: AC
  Administered 2022-11-07: 0.5 mL via INTRAMUSCULAR

## 2022-11-07 NOTE — ED Provider Notes (Signed)
Emily Tapia    CSN: [id number] Arrival date & time: 11/07/22  1140      History   Chief Complaint Chief Complaint  Patient presents with   Finger Injury    HPI Emily Tapia is a 61 y.o. female.   HPI Here for pain in her right middle and ring fingers.  This morning she was trying to shut a door at her house and closed those fingers in the door.  There is pain and swelling.  She cannot get her ring off her ring finger.  Just finished chemotherapy for breast cancer    Past Medical History:  Diagnosis Date   Anxiety    Aortic stenosis    Cirrhosis ([place])    Diabetes mellitus    Edema    Heart murmur    Hypertension    Sleep apnea    Thyroid disease     Patient Active Problem List   Diagnosis Date Noted   Chronic diarrhea    Erosive gastritis    Elevated LDL cholesterol level 06/06/2022   Type 2 diabetes mellitus with hyperglycemia, without long-term current use of insulin ([place]) 03/13/2022   Hypertension associated with diabetes ([place]) 03/13/2022   Hypergranulation 01/31/2022   Malignant neoplasm of upper-inner quadrant of left breast in female, estrogen receptor positive ([place]) 10/23/2021   Carcinoma of left breast ([place]) 08/30/2021   Status post left mastectomy 08/22/2021   Other peripheral vertigo, unspecified ear 01/02/2020   Pelvic mass 07/08/2015   Hyperlipidemia, mixed 07/08/2015   Hx of iron deficiency 07/08/2015   Absolute anemia 04/27/2015   Breath shortness 04/27/2015   Accumulation of fluid in tissues 04/27/2015   Cardiac murmur 04/27/2015   HLD (hyperlipidemia) 04/27/2015   Decreased potassium in the blood 04/27/2015   Hypothyroidism 04/27/2015   Obstructive sleep apnea 04/27/2015   Disorder of peripheral nervous system 04/27/2015   Obstructive apnea 04/27/2015   Pain in shoulder 04/27/2015   Fibroid uterus 10/25/2013   Aortic valve stenosis 05/01/2011   Edema 03/27/2011   SOB (shortness of breath) 03/27/2011   Anxiety,  generalized 07/05/2006    Past Surgical History:  Procedure Laterality Date   ABDOMINAL HYSTERECTOMY  11/17/2013   BREAST BIOPSY     x2   BREAST BIOPSY Left 07/20/2021   [place] Bx, Ribbon Clip, Path pending   BREAST BIOPSY Left 07/20/2021   Stereo Bx, X-clip, path pending   BREAST BIOPSY Left 07/20/2021   Stereo biopsy, coil clip, path pending   BREAST BIOPSY Right 07/20/2021   Stereo Bx, Ribbon Clip, path pending   CESAREAN SECTION     x 2   CHOLECYSTECTOMY  12/17/2001   DUE TO STONES   COLONOSCOPY WITH PROPOFOL N/A 07/26/2022   Procedure: COLONOSCOPY WITH PROPOFOL;  Surgeon: Lin Landsman, MD;  Location: [facility] ENDOSCOPY;  Service: Gastroenterology;  Laterality: N/A;   DILATION AND CURETTAGE OF UTERUS     ELBOW SURGERY     ESOPHAGOGASTRODUODENOSCOPY (EGD) WITH PROPOFOL N/A 07/26/2022   Procedure: ESOPHAGOGASTRODUODENOSCOPY (EGD) WITH PROPOFOL;  Surgeon: Lin Landsman, MD;  Location: [place];  Service: Gastroenterology;  Laterality: N/A;   POLYPECTOMY  12/17/1993   PORTACATH PLACEMENT Right 10/25/2021   Procedure: INSERTION PORT-A-CATH;  Surgeon: Ronny Bacon, MD;  Location: [facility] ORS;  Service: General;  Laterality: Right;   SIMPLE MASTECTOMY WITH AXILLARY SENTINEL NODE BIOPSY Left 08/16/2021   Procedure: SIMPLE MASTECTOMY WITH AXILLARY SENTINEL NODE BIOPSY;  Surgeon: Ronny Bacon, MD;  Location: [facility] ORS;  Service: General;  Laterality: Left;    OB History     Gravida  3   Para  2   Term      Preterm      AB      Living         SAB      IAB      Ectopic      Multiple      Live Births               Home Medications    Prior to Admission medications   Medication Sig Start Date End Date Taking? Authorizing Provider  traMADol (ULTRAM) 50 MG tablet Take 1 tablet (50 mg total) by mouth every 6 (six) hours as needed (pain). 11/07/22  Yes Barrett Henle, MD  amLODipine (NORVASC) 5 MG tablet Take 1 tablet by mouth once daily 08/13/22    Tally Joe T, [name]  Ascorbic Acid (VITAMIN C PO) Take 1 tablet by mouth daily.    [provider]  aspirin 81 MG EC tablet Take 81 mg by mouth at bedtime.    [provider]  carvedilol (COREG) 25 MG tablet Take 1 tablet by mouth twice daily 08/27/22   Tally Joe T, [name]  Cholecalciferol (VITAMIN D3) 1000 UNITS CAPS Take 1,000 Units by mouth at bedtime.    [provider]  diphenoxylate-atropine (LOMOTIL) 2.5-0.025 MG tablet Take 2 tablets by mouth 4 (four) times daily as needed for diarrhea or loose stools. 08/24/22   Lloyd Huger, MD  Ferrous Sulfate (IRON) 325 (65 Fe) MG TABS Take 1 tablet by mouth once daily with breakfast 04/24/19   Chrismon, Simona Huh E, PA-C  furosemide (LASIX) 40 MG tablet TAKE 1 TABLET BY MOUTH ONCE DAILY AS NEEDED FOR FLUID OR EDEMA 10/26/21   Tally Joe T, [name]  glucose blood (CONTOUR NEXT TEST) test strip Test fasting sugar each morning. Recheck if having hypoglycemic symptoms. 10/03/18   Chrismon, Simona Huh E, PA-C  glyBURIDE (DIABETA) 5 MG tablet TAKE 1 TABLET BY MOUTH TWICE DAILY WITH MEALS. 12/06/21   [name], [name]  hydrochlorothiazide (HYDRODIURIL) 25 MG tablet Take 1 tablet (25 mg total) by mouth daily. 12/06/21 12/01/22  [name], [name]  letrozole [facility]) 2.5 MG tablet Take 1 tablet (2.5 mg total) by mouth daily. 10/25/22   Lloyd Huger, MD  levothyroxine (EUTHYROX) 75 MCG tablet TAKE 1 TABLET BY MOUTH ONCE DAILY BEFORE BREAKFAST . 06/06/22   [name], [name]  lisinopril (ZESTRIL) 40 MG tablet Take 1 tablet (40 mg total) by mouth daily. 06/06/22 12/03/22  [name], [name]  metFORMIN (GLUCOPHAGE) 1000 MG tablet Take 1 tablet (1,000 mg total) by mouth 2 (two) times daily with a meal. 06/06/22   [name], [name]  Multiple Vitamin (MULTIVITAMIN) tablet Take 1 tablet by mouth every morning.    [provider]  mupirocin ointment (BACTROBAN) 2 % Apply 1 application topically 2 (two) times daily. 11/07/21    Borders, Kirt Boys, NP  omeprazole (PRILOSEC) 40 MG capsule TAKE 1 CAPSULE BY MOUTH ONCE DAILY BEFORE BREAKFAST 10/22/22   Vanga, Tally Due, MD  PARoxetine (PAXIL) 20 MG tablet Take 1 tablet (20 mg total) by mouth daily. 06/06/22   [name], [name]  spironolactone (ALDACTONE) 50 MG tablet Take 1 tablet (50 mg total) by mouth daily. 10/23/22   Lin Landsman, MD  zinc gluconate 50 MG tablet Take 50 mg by mouth daily.    [provider]    Family History Family History  Problem Relation Age of Onset   Hyperlipidemia Mother    Thyroid disease Mother    Hashimoto's thyroiditis Mother    Heart disease Father        open heart surgery   Heart failure Father    Diabetes Father    Dementia Father    Thyroid disease Brother    Breast cancer Maternal Grandmother    Diabetes Paternal Grandmother    Prostate cancer Paternal Grandfather     Social History Social History   Tobacco Use   Smoking status: Former    Packs/day: 1.50    Years: 25.00    Total pack years: 37.50    Types: Cigarettes    Quit date: 11/24/2001    Years since quitting: 20.9   Smokeless tobacco: Never  Vaping Use   Vaping Use: Never used  Substance Use Topics   Alcohol use: Yes    Alcohol/week: 0.0 standard drinks of alcohol    Comment: OCCASIONALLY DRINKS WINE, ONCE A WEEK   Drug use: No     Allergies   Fosaprepitant   Review of Systems Review of Systems   Physical Exam Triage Vital Signs ED Triage Vitals  Enc Vitals Group     BP 11/07/22 1322 136/69     Pulse Rate 11/07/22 1322 63     Resp 11/07/22 1322 18     Temp 11/07/22 1322 98.1 F (36.7 C)     Temp src --      SpO2 11/07/22 1322 95 %     Weight 11/07/22 1335 187 lb (84.8 kg)     Height 11/07/22 1335 '5\' 4"'$  (1.626 m)     Head Circumference --      Peak Flow --      Pain Score 11/07/22 1335 7     Pain Loc --      Pain Edu? --      Excl. in [place]? --    No data found.  Updated Vital Signs BP 136/69   Pulse 63   Temp  98.1 F (36.7 C)   Resp 18   Ht '5\' 4"'$  (1.626 m)   Wt 84.8 kg   SpO2 95%   BMI 32.10 kg/m   Visual Acuity Right Eye Distance:   Left Eye Distance:   Bilateral Distance:    Right Eye Near:   Left Eye Near:    Bilateral Near:     Physical Exam Vitals reviewed.  Constitutional:      General: She is not in acute distress.    Appearance: She is not ill-appearing, toxic-appearing or diaphoretic.  Musculoskeletal:     Comments: Ecchymosis and swelling of the right middle and ring finger mainly around the PIPs and onto the middle phalanx of both fingers.  We are unable to get a tourniquet around the ring to bring it off her finger.  Delayed refill is normal distally   Skin:    Coloration: Skin is not jaundiced or pale.     Comments: There is a shallow laceration on her middle finger on the palmar side.  It is not bleeding  Neurological:     General: No focal deficit present.     Mental Status: She is alert.      UC Treatments / Results  Labs (all labs ordered are listed, but only abnormal results are displayed) Labs Reviewed - No data to display  EKG   Radiology DG Hand Complete  Right  Result Date: 11/07/2022 CLINICAL DATA:  Right middle and ring finger pain and swelling after door injury. EXAM: RIGHT HAND - COMPLETE 3+ VIEW COMPARISON:  August 05, 2021. FINDINGS: There is no evidence of fracture or dislocation. There is no evidence of arthropathy or other focal bone abnormality. Soft tissues are unremarkable. Patient could not remove the ring from fourth finger, and therefore underlying fracture cannot be excluded. IMPRESSION: No definite abnormality seen. Electronically Signed   By: Marijo Conception M.D.   On: 11/07/2022 13:56    Procedures Procedures (including critical care time)  Medications Ordered in UC Medications  Tdap (BOOSTRIX) injection 0.5 mL (has no administration in time range)    Initial Impression / Assessment and Plan / UC Course  I have reviewed the  triage vital signs and the nursing notes.  Pertinent labs & imaging results that were available during my care of the patient were reviewed by me and considered in my medical decision making (see chart for details).        No fractures seen on the x-rays.  She does not hurt where the ring sits, so I doubt she has a fracture that is obscured by the ring.  We discussed whether or not to remove the ring with my ring cutter here.  She would like to try to save the ring, so we are not going to do that at this time.  Her circulation distal to the ring is good.  She is given warnings to return if she feels that the swelling is increasing and that the ring is causing problems for the circulation of that finger.  We do not have the attachments for electric ring cutter and have only the manual.  I did discuss with her that she could also go to another facility that has a electric ring cutter if she needs it off.  Tetanus booster was given today Final Clinical Impressions(s) / UC Diagnoses   Final diagnoses:  Right hand pain  Abrasion  Need for prophylactic vaccination against diphtheria and tetanus     Discharge Instructions      There were no broken bones on your x-ray  You are given a Tdap tetanus booster  Ice and elevate your hand today and tomorrow.  Take tramadol 50 mg-- 1 tablet every 6 hours as needed for pain.  This medication can make you sleepy or dizzy   Return to be seen if you feel that your hand is swelling more and the ring is causing problems to the circulation of your finger.     ED Prescriptions     Medication Sig Dispense Auth. Provider   traMADol (ULTRAM) 50 MG tablet Take 1 tablet (50 mg total) by mouth every 6 (six) hours as needed (pain). 12 tablet Emily Tapia, Gwenlyn Perking, MD      I have reviewed the PDMP during this encounter.   Barrett Henle, MD 11/07/22 [phone]

## 2022-11-07 NOTE — ED Triage Notes (Signed)
Patient to Urgent Care with complaints of right sided ring and middle finger swelling and pain after trapping digits in door this afternoon. Bruising present to two fingers and hand. Laceration present to ring finger. Patient with ring present to middle finger she is unable to remove due to swelling.

## 2022-11-07 NOTE — Discharge Instructions (Addendum)
There were no broken bones on your x-ray  You are given a Tdap tetanus booster  Ice and elevate your hand today and tomorrow.  Take tramadol 50 mg-- 1 tablet every 6 hours as needed for pain.  This medication can make you sleepy or dizzy   Return to be seen if you feel that your hand is swelling more and the ring is causing problems to the circulation of your finger.

## 2022-11-10 ENCOUNTER — Other Ambulatory Visit: Payer: Self-pay

## 2022-11-13 ENCOUNTER — Other Ambulatory Visit: Payer: Self-pay | Admitting: Gastroenterology

## 2022-11-13 NOTE — Telephone Encounter (Signed)
Never came for labs sent mychart message

## 2022-11-15 DIAGNOSIS — K7460 Unspecified cirrhosis of liver: Secondary | ICD-10-CM | POA: Diagnosis not present

## 2022-11-16 ENCOUNTER — Telehealth: Payer: Self-pay

## 2022-11-16 ENCOUNTER — Encounter: Payer: Self-pay | Admitting: Oncology

## 2022-11-16 ENCOUNTER — Other Ambulatory Visit: Payer: Self-pay | Admitting: Gastroenterology

## 2022-11-16 DIAGNOSIS — E875 Hyperkalemia: Secondary | ICD-10-CM

## 2022-11-16 LAB — BASIC METABOLIC PANEL
BUN/Creatinine Ratio: 29 — ABNORMAL HIGH (ref 12–28)
BUN: 31 mg/dL — ABNORMAL HIGH (ref 8–27)
CO2: 20 mmol/L (ref 20–29)
Calcium: 9.5 mg/dL (ref 8.7–10.3)
Chloride: 106 mmol/L (ref 96–106)
Creatinine, Ser: 1.07 mg/dL — ABNORMAL HIGH (ref 0.57–1.00)
Glucose: 118 mg/dL — ABNORMAL HIGH (ref 70–99)
Potassium: 5.3 mmol/L — ABNORMAL HIGH (ref 3.5–5.2)
Sodium: 141 mmol/L (ref 134–144)
eGFR: 59 mL/min/{1.73_m2} — ABNORMAL LOW (ref >59)

## 2022-11-16 NOTE — Telephone Encounter (Signed)
Called and left a message for call back. Order labs for Monday. Tried to call patient again and left a message for call back. Called patient spouse [name] which is on DPR and left a message for call back

## 2022-11-16 NOTE — Telephone Encounter (Signed)
-----   Message from Lin Landsman, MD sent at 11/16/2022 10:14 AM EST ----- Please inform pt to stop lasix and spironolactone because her potassium is slightly high. Let's recheck BMP Monday. If she doesn't feel good or having palpitations, she should go to urgent care or ER  RV

## 2022-11-16 NOTE — Telephone Encounter (Signed)
Patient called back and verbalized understanding she will get lab work done on Monday

## 2022-11-19 DIAGNOSIS — E875 Hyperkalemia: Secondary | ICD-10-CM | POA: Diagnosis not present

## 2022-11-21 ENCOUNTER — Encounter: Payer: Self-pay | Admitting: Oncology

## 2022-11-21 ENCOUNTER — Telehealth: Payer: Self-pay

## 2022-11-21 DIAGNOSIS — I10 Essential (primary) hypertension: Secondary | ICD-10-CM | POA: Diagnosis not present

## 2022-11-21 DIAGNOSIS — E860 Dehydration: Secondary | ICD-10-CM | POA: Diagnosis not present

## 2022-11-21 DIAGNOSIS — Z7982 Long term (current) use of aspirin: Secondary | ICD-10-CM | POA: Diagnosis not present

## 2022-11-21 DIAGNOSIS — R7989 Other specified abnormal findings of blood chemistry: Secondary | ICD-10-CM | POA: Diagnosis not present

## 2022-11-21 DIAGNOSIS — E119 Type 2 diabetes mellitus without complications: Secondary | ICD-10-CM | POA: Diagnosis not present

## 2022-11-21 LAB — BASIC METABOLIC PANEL
BUN/Creatinine Ratio: 34 — ABNORMAL HIGH (ref 12–28)
BUN: 38 mg/dL — ABNORMAL HIGH (ref 8–27)
CO2: 13 mmol/L — ABNORMAL LOW (ref 20–29)
Calcium: 9.5 mg/dL (ref 8.7–10.3)
Chloride: 105 mmol/L (ref 96–106)
Creatinine, Ser: 1.13 mg/dL — ABNORMAL HIGH (ref 0.57–1.00)
Glucose: 234 mg/dL — ABNORMAL HIGH (ref 70–99)
Potassium: 5.2 mmol/L (ref 3.5–5.2)
Sodium: 136 mmol/L (ref 134–144)
eGFR: 55 mL/min/{1.73_m2} — ABNORMAL LOW (ref >59)

## 2022-11-21 NOTE — Telephone Encounter (Signed)
-----   Message from Lin Landsman, MD sent at 11/20/2022 12:21 PM EST ----- Please remind her about the lab work the she was supposed to do yesterday  Thanks RV

## 2022-11-21 NOTE — Telephone Encounter (Signed)
Informed patient this morning  at 8:35am she needed to go to the ER because it was very serious it get evaluated she first said she could not go till she get off work at 4. Put patient on hold and cold Dr. Marius Ditch she said to ask her to go right now. Informed patient this and she said okay she would go

## 2022-11-21 NOTE — Telephone Encounter (Signed)
Called patient, she went to [facility] this morning, labs were drawn around 2 PM, still waiting on the results  RV

## 2022-11-21 NOTE — Telephone Encounter (Signed)
-----   Message from Lin Landsman, MD sent at 11/21/2022  8:07 AM EST ----- Emily Tapia  When was this lab drawn, if it was drawn 2 days ago, please repeat it today. Her bicarb is very low, either she has to go to ER or go to lab now  RV

## 2022-11-21 NOTE — Telephone Encounter (Signed)
Patient verbalized understanding she states she will go to the ER right now

## 2022-11-21 NOTE — Telephone Encounter (Signed)
Patient had labs drew the labs are in epic now

## 2022-11-22 ENCOUNTER — Ambulatory Visit
Admission: RE | Admit: 2022-11-22 | Discharge: 2022-11-22 | Disposition: A | Discharge status: Home / Self Care | Payer: 59 | Source: Ambulatory Visit | Attending: Oncology | Admitting: Oncology

## 2022-11-22 DIAGNOSIS — Z170 Estrogen receptor positive status [ER+]: Secondary | ICD-10-CM | POA: Diagnosis not present

## 2022-11-22 DIAGNOSIS — Z1231 Encounter for screening mammogram for malignant neoplasm of breast: Secondary | ICD-10-CM | POA: Diagnosis not present

## 2022-11-22 DIAGNOSIS — C50812 Malignant neoplasm of overlapping sites of left female breast: Secondary | ICD-10-CM | POA: Diagnosis not present

## 2022-12-09 ENCOUNTER — Other Ambulatory Visit: Payer: Self-pay | Admitting: Family Medicine

## 2022-12-09 DIAGNOSIS — I152 Hypertension secondary to endocrine disorders: Secondary | ICD-10-CM

## 2022-12-23 ENCOUNTER — Other Ambulatory Visit: Payer: Self-pay | Admitting: Family Medicine

## 2022-12-24 NOTE — Telephone Encounter (Signed)
Patient needs OV, will refill medication for 30 days, patient need OV for additional refills.  Requested Prescriptions  Pending Prescriptions Disp Refills   hydrochlorothiazide (HYDRODIURIL) 25 MG tablet [Pharmacy Med Name: hydroCHLOROthiazide 25 MG Oral Tablet] 30 tablet 0    Sig: Take 1 tablet by mouth once daily     Cardiovascular: Diuretics - Thiazide Failed - 12/23/2022  3:18 PM      Failed - Cr in normal range and within 180 days    Creatinine, Ser  Date Value Ref Range Status  11/19/2022 1.13 (H) 0.57 - 1.00 mg/dL Final   Creatinine, POC  Date Value Ref Range Status  02/24/2016 NA mg/dL Final         Failed - Valid encounter within last 6 months    Recent Outpatient Visits           6 months ago Type 2 diabetes mellitus with hyperglycemia, without long-term current use of insulin [facility])   [facility] Tally Joe T, [name]   9 months ago Type 2 diabetes mellitus with hyperglycemia, without long-term current use of insulin [facility])   [facility] Tally Joe T, [name]   1 year ago Type 2 diabetes mellitus without complication, without long-term current use of insulin ([place])   [facility] Tally Joe T, [name]   1 year ago Essential (primary) hypertension   [employer], Vickki Muff, PA-C   2 years ago Diabetic peripheral neuropathy associated with type 2 diabetes mellitus ([place])   [employer], PA-C       Future Appointments             In 1 week [place] in normal range and within 180 days    Potassium  Date Value Ref Range Status  11/19/2022 5.2 3.5 - 5.2 mmol/L Final         Passed - Na in normal range and within 180 days    Sodium  Date Value Ref Range Status  11/19/2022 136 134 - 144 mmol/L Final         Passed - Last BP in normal range    BP Readings from Last 1 Encounters:  11/07/22 136/69          lisinopril (ZESTRIL) 40 MG  tablet [Pharmacy Med Name: Lisinopril 40 MG Oral Tablet] 30 tablet 0    Sig: Take 1 tablet by mouth once daily     Cardiovascular:  ACE Inhibitors Failed - 12/23/2022  3:18 PM      Failed - Cr in normal range and within 180 days    Creatinine, Ser  Date Value Ref Range Status  11/19/2022 1.13 (H) 0.57 - 1.00 mg/dL Final   Creatinine, POC  Date Value Ref Range Status  02/24/2016 NA mg/dL Final         Failed - Valid encounter within last 6 months    Recent Outpatient Visits           6 months ago Type 2 diabetes mellitus with hyperglycemia, without long-term current use of insulin [facility])   [facility] Tally Joe T, [name]   9 months ago Type 2 diabetes mellitus with hyperglycemia, without long-term current use of insulin [facility])   [facility] Tally Joe T, [name]   1 year ago Type 2 diabetes mellitus without complication, without long-term current use of insulin ([place])  [facility] Tally Joe T, [name]   1 year ago Essential (primary) hypertension   [employer], Vickki Muff, PA-C   2 years ago Diabetic peripheral neuropathy associated with type 2 diabetes mellitus [facility])   [place], PA-C       Future Appointments             In 1 week [place] in normal range and within 180 days    Potassium  Date Value Ref Range Status  11/19/2022 5.2 3.5 - 5.2 mmol/L Final         Passed - Patient is not pregnant      Passed - Last BP in normal range    BP Readings from Last 1 Encounters:  11/07/22 136/69

## 2023-01-02 ENCOUNTER — Ambulatory Visit: Payer: 59

## 2023-01-02 ENCOUNTER — Other Ambulatory Visit: Payer: Self-pay

## 2023-01-03 ENCOUNTER — Other Ambulatory Visit: Payer: 59

## 2023-01-11 ENCOUNTER — Telehealth: Payer: Self-pay | Admitting: Gastroenterology

## 2023-01-11 DIAGNOSIS — K625 Hemorrhage of anus and rectum: Secondary | ICD-10-CM | POA: Diagnosis not present

## 2023-01-11 NOTE — Telephone Encounter (Signed)
Patient states the rectal bleeding started 3 days ago. She states the bleeding is with every bowel movement and it feels up the toilet bowel. She states it is on the toliet paper and when she stands out it drips on the toilet seat. She states on Wednesday she had some abdominal pain and vomited once or twice. She does feel a little lightheaded but not that bad she states. She has not been constipated. She states she has chronic diarrhea but this week her stools have been a little bit more solid but not normal. On Wednesday went 4 times, Thursday twice, and today she has went once. Order a STAT CBC and [facility] to [facility] work done.

## 2023-01-11 NOTE — Addendum Note (Signed)
Addended by: Ulyess Blossom L on: 01/11/2023 11:18 AM   Modules accepted: Orders

## 2023-01-11 NOTE — Telephone Encounter (Signed)
Pt has had rectal bleeding for the last couple of days would like a call back on what she should do and should she be concerned. Call back [phone]

## 2023-01-12 LAB — CBC WITH DIFFERENTIAL/PLATELET
Basophils Absolute: 0 10*3/uL (ref 0.0–0.2)
Basos: 0 %
EOS (ABSOLUTE): 0.2 10*3/uL (ref 0.0–0.4)
Eos: 3 %
Hematocrit: 35.2 % (ref 34.0–46.6)
Hemoglobin: 11 g/dL — ABNORMAL LOW (ref 11.1–15.9)
Immature Grans (Abs): 0 10*3/uL (ref 0.0–0.1)
Immature Granulocytes: 1 %
Lymphocytes Absolute: 2 10*3/uL (ref 0.7–3.1)
Lymphs: 32 %
MCH: 25.5 pg — ABNORMAL LOW (ref 26.6–33.0)
MCHC: 31.3 g/dL — ABNORMAL LOW (ref 31.5–35.7)
MCV: 82 fL (ref 79–97)
Monocytes Absolute: 0.4 10*3/uL (ref 0.1–0.9)
Monocytes: 5 %
Neutrophils Absolute: 3.8 10*3/uL (ref 1.4–7.0)
Neutrophils: 59 %
Platelets: 113 10*3/uL — ABNORMAL LOW (ref 150–450)
RBC: 4.32 x10E6/uL (ref 3.77–5.28)
RDW: 15.5 % — ABNORMAL HIGH (ref 11.7–15.4)
WBC: 6.4 10*3/uL (ref 3.4–10.8)

## 2023-01-13 ENCOUNTER — Other Ambulatory Visit: Payer: Self-pay | Admitting: Family Medicine

## 2023-01-13 DIAGNOSIS — E1142 Type 2 diabetes mellitus with diabetic polyneuropathy: Secondary | ICD-10-CM

## 2023-01-14 ENCOUNTER — Telehealth: Payer: Self-pay

## 2023-01-14 MED ORDER — HYDROCORTISONE (PERIANAL) 2.5 % EX CREA: 1.0000 | TOPICAL_CREAM | Freq: Two times a day (BID) | CUTANEOUS | 0 refills | Status: DC

## 2023-01-14 NOTE — Telephone Encounter (Signed)
-----  Message from Lin Landsman, MD sent at 01/14/2023  9:10 AM EST ----- Please inform patient that her hemoglobin is stable.  Rectal bleeding is probably secondary to hemorrhoids.  If she is still having bleeding, recommend Anusol cream per rectum twice daily for 7 to 10 days  RV

## 2023-01-14 NOTE — Telephone Encounter (Signed)
Patient verbalized understanding of results. Patient states she is still having the rectal bleeding. Called in Anusol cream to the pharmacy

## 2023-01-19 ENCOUNTER — Other Ambulatory Visit: Payer: Self-pay | Admitting: Family Medicine

## 2023-01-20 ENCOUNTER — Other Ambulatory Visit: Payer: Self-pay | Admitting: Family Medicine

## 2023-01-20 DIAGNOSIS — E1142 Type 2 diabetes mellitus with diabetic polyneuropathy: Secondary | ICD-10-CM

## 2023-01-22 NOTE — Telephone Encounter (Signed)
Requested medication (s) are due for refill today: yes  Requested medication (s) are on the active medication list: yes    Last refill: 12/06/21  #180  3 refills  Future visit scheduled no  Notes to clinic: Failed due to labs, please review. Thank you.  Requested Prescriptions  Pending Prescriptions Disp Refills   glyBURIDE (DIABETA) 5 MG tablet [Pharmacy Med Name: glyBURIDE 5 MG Oral Tablet] 180 tablet 0    Sig: TAKE 1 TABLET BY MOUTH TWICE DAILY WITH MEALS     Endocrinology:  Diabetes - Sulfonylureas - glyburide Failed - 01/20/2023  5:14 PM      Failed - HBA1C in normal range and within 180 days    Hemoglobin A1C  Date Value Ref Range Status  06/06/2022 7.4 (A) 4.0 - 5.6 % Final   Hgb A1c MFr Bld  Date Value Ref Range Status  07/14/2021 6.5 (H) 4.8 - 5.6 % Final    Comment:             Prediabetes: 5.7 - 6.4          Diabetes: >6.4          Glycemic control for adults with diabetes: <7.0          Failed - Cr in normal range and within 360 days    Creatinine, Ser  Date Value Ref Range Status  11/19/2022 1.13 (H) 0.57 - 1.00 mg/dL Final   Creatinine, POC  Date Value Ref Range Status  02/24/2016 NA mg/dL Final         Failed - eGFR is 60 or above and within 360 days    GFR calc Af Amer  Date Value Ref Range Status  10/14/2020 111 >59 mL/min/1.73 Final    Comment:    **In accordance with recommendations from the NKF-ASN Task force,**   [facility] is in the process of updating its eGFR calculation to the   2021 CKD-EPI creatinine equation that estimates kidney function   without a race variable.    GFR, Estimated  Date Value Ref Range Status  08/02/2022 >60 >60 mL/min Final    Comment:    (NOTE) Calculated using the CKD-EPI Creatinine Equation (2021)    eGFR  Date Value Ref Range Status  11/19/2022 55 (L) >59 mL/min/1.73 Final         Failed - Valid encounter within last 6 months    Recent Outpatient Visits           7 months ago Type 2 diabetes mellitus  with hyperglycemia, without long-term current use of insulin [facility])   [place] Tally Joe T, [name]   10 months ago Type 2 diabetes mellitus with hyperglycemia, without long-term current use of insulin [facility])   [place] Tally Joe T, [name]   1 year ago Type 2 diabetes mellitus without complication, without long-term current use of insulin [facility])   [place] Tally Joe T, [name]   1 year ago Essential (primary) hypertension   [place], PA-C   2 years ago Diabetic peripheral neuropathy associated with type 2 diabetes mellitus ([place])   [place], PA-C       Future Appointments             In 1 week [facility] [place]  Disp Refills   lisinopril (ZESTRIL) 40 MG tablet [Pharmacy Med Name: Lisinopril 40 MG Oral Tablet] 30 tablet 0    Sig: Take 1 tablet by mouth once daily     Cardiovascular:  ACE Inhibitors Failed - 01/20/2023  5:14 PM      Failed - Cr in normal range and within 180 days    Creatinine, Ser  Date Value Ref Range Status  11/19/2022 1.13 (H) 0.57 - 1.00 mg/dL Final   Creatinine, POC  Date Value Ref Range Status  02/24/2016 NA mg/dL Final         Failed - Valid encounter within last 6 months    Recent Outpatient Visits           7 months ago Type 2 diabetes mellitus with hyperglycemia, without long-term current use of insulin [facility])   [place] Tally Joe T, [name]   10 months ago Type 2 diabetes mellitus with hyperglycemia, without long-term current use of insulin [facility])   [place] Tally Joe T, [name]   1 year ago Type 2 diabetes mellitus without complication, without long-term current use of insulin [facility])   [place] Tally Joe T, [name]    1 year ago Essential (primary) hypertension   [place], PA-C   2 years ago Diabetic peripheral neuropathy associated with type 2 diabetes mellitus ([place])   [place], PA-C       Future Appointments             In 1 week [facility] [place] in normal range and within 180 days    Potassium  Date Value Ref Range Status  11/19/2022 5.2 3.5 - 5.2 mmol/L Final         Passed - Patient is not pregnant      Passed - Last BP in normal range    BP Readings from Last 1 Encounters:  11/07/22 136/69

## 2023-01-22 NOTE — Telephone Encounter (Signed)
Requested Prescriptions  Pending Prescriptions Disp Refills   glyBURIDE (DIABETA) 5 MG tablet [Pharmacy Med Name: glyBURIDE 5 MG Oral Tablet] 180 tablet 0    Sig: TAKE 1 TABLET BY MOUTH TWICE DAILY WITH MEALS     Endocrinology:  Diabetes - Sulfonylureas - glyburide Failed - 01/20/2023  5:14 PM      Failed - HBA1C in normal range and within 180 days    Hemoglobin A1C  Date Value Ref Range Status  06/06/2022 7.4 (A) 4.0 - 5.6 % Final   Hgb A1c MFr Bld  Date Value Ref Range Status  07/14/2021 6.5 (H) 4.8 - 5.6 % Final    Comment:             Prediabetes: 5.7 - 6.4          Diabetes: >6.4          Glycemic control for adults with diabetes: <7.0          Failed - Cr in normal range and within 360 days    Creatinine, Ser  Date Value Ref Range Status  11/19/2022 1.13 (H) 0.57 - 1.00 mg/dL Final   Creatinine, POC  Date Value Ref Range Status  02/24/2016 NA mg/dL Final         Failed - eGFR is 60 or above and within 360 days    GFR calc Af Amer  Date Value Ref Range Status  10/14/2020 111 >59 mL/min/1.73 Final    Comment:    **In accordance with recommendations from the NKF-ASN Task force,**   [facility] is in the process of updating its eGFR calculation to the   2021 CKD-EPI creatinine equation that estimates kidney function   without a race variable.    GFR, Estimated  Date Value Ref Range Status  08/02/2022 >60 >60 mL/min Final    Comment:    (NOTE) Calculated using the CKD-EPI Creatinine Equation (2021)    eGFR  Date Value Ref Range Status  11/19/2022 55 (L) >59 mL/min/1.73 Final         Failed - Valid encounter within last 6 months    Recent Outpatient Visits           7 months ago Type 2 diabetes mellitus with hyperglycemia, without long-term current use of insulin [facility])   [place] Tally Joe T, [name]   10 months ago Type 2 diabetes mellitus with hyperglycemia, without long-term current use of insulin [facility])   [place] Tally Joe T, [name]   1 year ago Type 2 diabetes mellitus without complication, without long-term current use of insulin [facility])   [place] Tally Joe T, [name]   1 year ago Essential (primary) hypertension   [place], PA-C   2 years ago Diabetic peripheral neuropathy associated with type 2 diabetes mellitus ([place])   [place], PA-C       Future Appointments             In 1 week [facility]              lisinopril (ZESTRIL) 40 MG tablet [Pharmacy Med Name: Lisinopril 40 MG Oral Tablet] 30 tablet 0    Sig: Take 1 tablet by mouth once daily     Cardiovascular:  ACE Inhibitors Failed - 01/20/2023  5:14 PM      Failed - Cr in  normal range and within 180 days    Creatinine, Ser  Date Value Ref Range Status  11/19/2022 1.13 (H) 0.57 - 1.00 mg/dL Final   Creatinine, POC  Date Value Ref Range Status  02/24/2016 NA mg/dL Final         Failed - Valid encounter within last 6 months    Recent Outpatient Visits           7 months ago Type 2 diabetes mellitus with hyperglycemia, without long-term current use of insulin [facility])   [place] Tally Joe T, [name]   10 months ago Type 2 diabetes mellitus with hyperglycemia, without long-term current use of insulin [facility])   [place] Tally Joe T, [name]   1 year ago Type 2 diabetes mellitus without complication, without long-term current use of insulin [facility])   [place] Tally Joe T, [name]   1 year ago Essential (primary) hypertension   [place], PA-C   2 years ago Diabetic peripheral neuropathy associated with type 2 diabetes mellitus ([place])   [place], PA-C       Future  Appointments             In 1 week [facility] at [place] in normal range and within 180 days    Potassium  Date Value Ref Range Status  11/19/2022 5.2 3.5 - 5.2 mmol/L Final         Passed - Patient is not pregnant      Passed - Last BP in normal range    BP Readings from Last 1 Encounters:  11/07/22 136/69

## 2023-01-23 ENCOUNTER — Inpatient Hospital Stay: Payer: 59 | Attending: Oncology | Admitting: Oncology

## 2023-01-23 ENCOUNTER — Encounter: Payer: Self-pay | Admitting: Oncology

## 2023-01-23 ENCOUNTER — Inpatient Hospital Stay: Payer: 59

## 2023-01-23 VITALS — BP 132/70 | HR 72 | Temp 98.5°F | Resp 18 | Wt 189.5 lb

## 2023-01-23 DIAGNOSIS — D649 Anemia, unspecified: Secondary | ICD-10-CM | POA: Insufficient documentation

## 2023-01-23 DIAGNOSIS — Z90710 Acquired absence of both cervix and uterus: Secondary | ICD-10-CM | POA: Insufficient documentation

## 2023-01-23 DIAGNOSIS — Z803 Family history of malignant neoplasm of breast: Secondary | ICD-10-CM | POA: Diagnosis not present

## 2023-01-23 DIAGNOSIS — I10 Essential (primary) hypertension: Secondary | ICD-10-CM | POA: Diagnosis not present

## 2023-01-23 DIAGNOSIS — Z170 Estrogen receptor positive status [ER+]: Secondary | ICD-10-CM | POA: Diagnosis not present

## 2023-01-23 DIAGNOSIS — K7581 Nonalcoholic steatohepatitis (NASH): Secondary | ICD-10-CM | POA: Insufficient documentation

## 2023-01-23 DIAGNOSIS — Z818 Family history of other mental and behavioral disorders: Secondary | ICD-10-CM | POA: Insufficient documentation

## 2023-01-23 DIAGNOSIS — Z79811 Long term (current) use of aromatase inhibitors: Secondary | ICD-10-CM | POA: Diagnosis not present

## 2023-01-23 DIAGNOSIS — Z8349 Family history of other endocrine, nutritional and metabolic diseases: Secondary | ICD-10-CM | POA: Insufficient documentation

## 2023-01-23 DIAGNOSIS — C779 Secondary and unspecified malignant neoplasm of lymph node, unspecified: Secondary | ICD-10-CM | POA: Insufficient documentation

## 2023-01-23 DIAGNOSIS — C50812 Malignant neoplasm of overlapping sites of left female breast: Secondary | ICD-10-CM

## 2023-01-23 DIAGNOSIS — Z833 Family history of diabetes mellitus: Secondary | ICD-10-CM | POA: Diagnosis not present

## 2023-01-23 DIAGNOSIS — D696 Thrombocytopenia, unspecified: Secondary | ICD-10-CM | POA: Diagnosis not present

## 2023-01-23 DIAGNOSIS — Z9049 Acquired absence of other specified parts of digestive tract: Secondary | ICD-10-CM | POA: Diagnosis not present

## 2023-01-23 DIAGNOSIS — Z8249 Family history of ischemic heart disease and other diseases of the circulatory system: Secondary | ICD-10-CM | POA: Diagnosis not present

## 2023-01-23 DIAGNOSIS — Z79899 Other long term (current) drug therapy: Secondary | ICD-10-CM | POA: Insufficient documentation

## 2023-01-23 DIAGNOSIS — R739 Hyperglycemia, unspecified: Secondary | ICD-10-CM | POA: Diagnosis not present

## 2023-01-23 DIAGNOSIS — F419 Anxiety disorder, unspecified: Secondary | ICD-10-CM | POA: Diagnosis not present

## 2023-01-23 DIAGNOSIS — Z87891 Personal history of nicotine dependence: Secondary | ICD-10-CM | POA: Diagnosis not present

## 2023-01-23 DIAGNOSIS — Z8042 Family history of malignant neoplasm of prostate: Secondary | ICD-10-CM | POA: Insufficient documentation

## 2023-01-23 DIAGNOSIS — Z5112 Encounter for antineoplastic immunotherapy: Secondary | ICD-10-CM | POA: Diagnosis not present

## 2023-01-23 LAB — CBC WITH DIFFERENTIAL/PLATELET
Abs Immature Granulocytes: 0.03 10*3/uL (ref 0.00–0.07)
Basophils Absolute: 0 10*3/uL (ref 0.0–0.1)
Basophils Relative: 1 %
Eosinophils Absolute: 0.2 10*3/uL (ref 0.0–0.5)
Eosinophils Relative: 3 %
HCT: 32.2 % — ABNORMAL LOW (ref 36.0–46.0)
Hemoglobin: 9.8 g/dL — ABNORMAL LOW (ref 12.0–15.0)
Immature Granulocytes: 1 %
Lymphocytes Relative: 26 %
Lymphs Abs: 1.2 10*3/uL (ref 0.7–4.0)
MCH: 24.9 pg — ABNORMAL LOW (ref 26.0–34.0)
MCHC: 30.4 g/dL (ref 30.0–36.0)
MCV: 81.9 fL (ref 80.0–100.0)
Monocytes Absolute: 0.3 10*3/uL (ref 0.1–1.0)
Monocytes Relative: 5 %
Neutro Abs: 3.1 10*3/uL (ref 1.7–7.7)
Neutrophils Relative %: 64 %
Platelets: 104 10*3/uL — ABNORMAL LOW (ref 150–400)
RBC: 3.93 MIL/uL (ref 3.87–5.11)
RDW: 15.8 % — ABNORMAL HIGH (ref 11.5–15.5)
WBC: 4.8 10*3/uL (ref 4.0–10.5)
nRBC: 0 % (ref 0.0–0.2)

## 2023-01-23 LAB — COMPREHENSIVE METABOLIC PANEL
ALT: 33 U/L (ref 0–44)
AST: 36 U/L (ref 15–41)
Albumin: 4 g/dL (ref 3.5–5.0)
Alkaline Phosphatase: 59 U/L (ref 38–126)
Anion gap: 11 (ref 5–15)
BUN: 21 mg/dL (ref 8–23)
CO2: 26 mmol/L (ref 22–32)
Calcium: 8.7 mg/dL — ABNORMAL LOW (ref 8.9–10.3)
Chloride: 99 mmol/L (ref 98–111)
Creatinine, Ser: 0.95 mg/dL (ref 0.44–1.00)
GFR, Estimated: >60 mL/min (ref >60)
Glucose, Bld: 317 mg/dL — ABNORMAL HIGH (ref 70–99)
Potassium: 3.8 mmol/L (ref 3.5–5.1)
Sodium: 136 mmol/L (ref 135–145)
Total Bilirubin: 0.3 mg/dL (ref 0.3–1.2)
Total Protein: 6.9 g/dL (ref 6.5–8.1)

## 2023-01-23 NOTE — Progress Notes (Signed)
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    INTERVAL HISTORY: Patient returns to clinic today for routine 60-monthevaluation.  She continues to feel well and remains asymptomatic.  Her diarrhea has essentially resolved.  She is tolerating letrozole without significant side effects.  She has no neurologic complaints.  She denies any recent fevers or illnesses.  She has a good appetite and denies weight loss.  She has no chest pain, shortness of breath, cough, or hemoptysis.  She denies any nausea, vomiting, or constipation.  She has no urinary complaints.  Patient offers no specific complaints today.  REVIEW OF SYSTEMS:   Review of Systems  Constitutional: Negative.  Negative for fever, malaise/fatigue and weight loss.  Respiratory: Negative.  Negative for cough and shortness of breath.   Cardiovascular: Negative.  Negative for chest pain and leg swelling.  Gastrointestinal: Negative.  Negative for abdominal pain and diarrhea.  Genitourinary: Negative.  Negative for dysuria.  Musculoskeletal: Negative.  Negative for back pain.  Skin: Negative.  Negative for rash.  Neurological: Negative.  Negative for dizziness, focal weakness, weakness and headaches.  Psychiatric/Behavioral: Negative.  The patient is not nervous/anxious.     As per HPI. Otherwise, a complete review of systems is negative.  PAST MEDICAL HISTORY: Past Medical History:  Diagnosis Date   Anxiety    Aortic stenosis    Cir[place]osis ([place]    Diabetes mellitus    Edema     Heart murmur    Hypertension    Sleep apnea    Thyroid disease     PAST SURGICAL HISTORY: Past Surgical History:  Procedure Laterality Date   ABDOMINAL HYSTERECTOMY  11/17/2013   BREAST BIOPSY     x2   BREAST BIOPSY Left 07/20/2021   [place]  BREAST BIOPSY Left 07/20/2021   Stereo Bx, X-clip, DCIS   BREAST BIOPSY Left 07/20/2021   Stereo biopsy, coil clip, DCIS   BREAST BIOPSY Right 07/20/2021   Stereo Bx, Ribbon Clip, Fat necrosis   CESAREAN SECTION     x 2   CHOLECYSTECTOMY  12/17/2001   DUE TO STONES   COLONOSCOPY WITH PROPOFOL N/A 07/26/2022   Procedure: COLONOSCOPY WITH PROPOFOL;  Surgeon: VLin Landsman MD;  [place]  Service: Gastroenterology;  Laterality: N/A;   DILATION AND CURETTAGE OF UTERUS     ELBOW SURGERY     ESOPHAGOGASTRODUODENOSCOPY (EGD) WITH PROPOFOL N/A 07/26/2022   Procedure: ESOPHAGOGASTRODUODENOSCOPY (EGD) WITH PROPOFOL;  Surgeon: VLin Landsman MD;  [place]  Service: Gastroenterology;  Laterality: N/A;   POLYPECTOMY  12/17/1993   PORTACATH PLACEMENT Right 10/25/2021   Procedure: INSERTION PORT-A-CATH;  Surgeon: RRonny Bacon MD;  Location: [facility];  Service: General;  Laterality: Right;   SIMPLE MASTECTOMY WITH AXILLARY SENTINEL NODE BIOPSY Left 08/16/2021   Procedure: SIMPLE MASTECTOMY WITH AXILLARY SENTINEL NODE BIOPSY;  Surgeon: RRonny Bacon MD;  Location: [facility];  Service: General;  Laterality: Left;    FAMILY HISTORY: Family History  Problem Relation Age of Onset   Hyperlipidemia Mother    Thyroid disease Mother  Hashimoto's thyroiditis Mother    Heart disease Father        open heart surgery   Heart failure Father    Diabetes Father    Dementia Father    Thyroid disease Brother    Breast cancer Maternal Grandmother    Diabetes Paternal Grandmother    Prostate cancer Paternal Grandfather     ADVANCED DIRECTIVES (Y/N):  N  HEALTH MAINTENANCE: Social  History   Tobacco Use   Smoking status: Former    Packs/day: 1.50    Years: 25.00    Total pack years: 37.50    Types: Cigarettes    Quit date: 11/24/2001    Years since quitting: 21.1   Smokeless tobacco: Never  Vaping Use   Vaping Use: Never used  Substance Use Topics   Alcohol use: Yes    Alcohol/week: 0.0 standard drinks of alcohol    Comment: OCCASIONALLY DRINKS WINE, ONCE A WEEK   Drug use: No     Colonoscopy:  PAP:  Bone density:  Lipid panel:  Allergies  Allergen Reactions   Fosaprepitant Nausea Only and Other (See Comments)    Back pain, nausea. See progress note from 10/27/2021     Current Outpatient Medications  Medication Sig Dispense Refill   amLODipine (NORVASC) 5 MG tablet Take 1 tablet by mouth once daily 90 tablet 1   Ascorbic Acid (VITAMIN C PO) Take 1 tablet by mouth daily.     aspirin 81 MG EC tablet Take 81 mg by mouth at bedtime.     carvedilol (COREG) 25 MG tablet Take 1 tablet by mouth twice daily 180 tablet 1   Cholecalciferol (VITAMIN D3) 1000 UNITS CAPS Take 1,000 Units by mouth at bedtime.     diphenoxylate-atropine (LOMOTIL) 2.5-0.025 MG tablet Take 2 tablets by mouth 4 (four) times daily as needed for diarrhea or loose stools. 60 tablet 1   Ferrous Sulfate (IRON) 325 (65 Fe) MG TABS Take 1 tablet by mouth once daily with breakfast 90 tablet 0   furosemide (LASIX) 40 MG tablet TAKE 1 TABLET BY MOUTH ONCE DAILY AS NEEDED FOR FLUID OR EDEMA 90 tablet 1   glucose blood (CONTOUR NEXT TEST) test strip Test fasting sugar each morning. Recheck if having hypoglycemic symptoms. 100 each 4   glyBURIDE (DIABETA) 5 MG tablet TAKE 1 TABLET BY MOUTH TWICE DAILY WITH MEALS. 180 tablet 3   hydrochlorothiazide (HYDRODIURIL) 25 MG tablet Take 1 tablet by mouth once daily 30 tablet 0   hydrocortisone (ANUSOL-HC) 2.5 % rectal cream Place 1 Application rectally 2 (two) times daily. 30 g 0   letrozole (FEMARA) 2.5 MG tablet Take 1 tablet (2.5 mg total) by mouth  daily. 90 tablet 3   levothyroxine (EUTHYROX) 75 MCG tablet TAKE 1 TABLET BY MOUTH ONCE DAILY BEFORE BREAKFAST . 90 tablet 1   lisinopril (ZESTRIL) 40 MG tablet Take 1 tablet by mouth once daily 30 tablet 0   metFORMIN (GLUCOPHAGE) 1000 MG tablet TAKE 1 TABLET BY MOUTH TWICE DAILY WITH MEALS 180 tablet 0   Multiple Vitamin (MULTIVITAMIN) tablet Take 1 tablet by mouth every morning.     omeprazole (PRILOSEC) 40 MG capsule TAKE 1 CAPSULE BY MOUTH ONCE DAILY BEFORE BREAKFAST 90 capsule 1   PARoxetine (PAXIL) 20 MG tablet Take 1 tablet (20 mg total) by mouth daily. 90 tablet 3   traMADol (ULTRAM) 50 MG tablet Take 1 tablet (50 mg total) by mouth every 6 (six) hours as needed (pain). 12 tablet 0  zinc gluconate 50 MG tablet Take 50 mg by mouth daily.     No current facility-administered medications for this visit.    OBJECTIVE: Vitals:   01/23/23 1015  BP: 132/70  Pulse: 72  Resp: 18  Temp: 98.5 F (36.9 C)     Body mass index is 32.53 kg/m.    ECOG FS:0 - Asymptomatic  General: Well-developed, well-nourished, no acute distress. Eyes: Pink conjunctiva, anicteric sclera. HEENT: Normocephalic, moist mucous membranes. Lungs: No audible wheezing or coughing. Heart: Regular rate and rhythm. Abdomen: Soft, nontender, no obvious distention. Musculoskeletal: No edema, cyanosis, or clubbing. Neuro: Alert, answering all questions appropriately. Cranial nerves grossly intact. Skin: No rashes or petechiae noted. Psych: Normal affect.  LAB RESULTS:  Lab Results  Component Value Date   NA 136 01/23/2023   K 3.8 01/23/2023   CL 99 01/23/2023   CO2 26 01/23/2023   GLUCOSE 317 (H) 01/23/2023   BUN 21 01/23/2023   CREATININE 0.95 01/23/2023   CALCIUM 8.7 (L) 01/23/2023   PROT 6.9 01/23/2023   ALBUMIN 4.0 01/23/2023   AST 36 01/23/2023   ALT 33 01/23/2023   ALKPHOS 59 01/23/2023   BILITOT 0.3 01/23/2023   GFRNONAA >60 01/23/2023   GFRAA 111 10/14/2020    Lab Results  Component  Value Date   WBC 4.8 01/23/2023   NEUTROABS 3.1 01/23/2023   HGB 9.8 (L) 01/23/2023   HCT 32.2 (L) 01/23/2023   MCV 81.9 01/23/2023   PLT 104 (L) 01/23/2023     STUDIES: No results found.  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  Previously, right breast biopsy was negative for malignancy. Patient only received 3 cycles of treatment, then carboplatinum and Taxotere were discontinued secondary to persistent thrombocytopenia.  She last received carboplatinum and Taxotere on December 22, 2021.  Patient was subsequently transitioned to maintenance Perjeta and Herceptin which she completed on October 25, 2022.  Continue letrozole completing 5 years of treatment in November 2028.  Her most recent mammogram on November 22, 2022 was reported as BI-RADS 1, repeat in December 2024.  Return to clinic in 6 months for routine evaluation.  Bone health: Patient has a bone mineral density scheduled for February 19, 2023.     Diarrhea: Resolved.  Continue Lomotil as needed.   Leukopenia: Resolved. Thrombocytopenia: Platelet count mildly improved to 104.   Anemia: Hemoglobin has trended down slightly to 9.8, monitor. Hyperglycemia: Chronic and unchanged.  Patient blood sugars remain persistently elevated.  Appreciate primary care input. NASH: Continue follow-up with GI as scheduled.   Patient expressed understanding and was in agreement with this plan. She also understands that She can call clinic at any time with any questions, concerns, or complaints.    Cancer Staging  Carcinoma of left breast [facility]) Staging form: Breast, AJCC 8th Edition - Pathologic stage from 08/30/2021: Stage IB (pT2, pN1a, cM0, G3, ER+, PR+, HER2+, Oncotype DX score: 18) -  Signed by Lloyd Huger, MD on 09/13/2021 Stage prefix: Initial diagnosis Multigene prognostic tests performed: Oncotype DX Recurrence score range: Greater than or equal to 11 Histologic grading system: 3 grade system  Lloyd Huger, MD   01/23/2023 3:05 PM

## 2023-01-23 NOTE — Progress Notes (Signed)
Patient here today for follow up regarding breast cancer. Patient denies any concerns.

## 2023-01-24 ENCOUNTER — Telehealth: Payer: Self-pay | Admitting: Family Medicine

## 2023-01-24 ENCOUNTER — Inpatient Hospital Stay: Payer: 59

## 2023-01-24 ENCOUNTER — Other Ambulatory Visit: Payer: Self-pay

## 2023-01-24 DIAGNOSIS — Z8042 Family history of malignant neoplasm of prostate: Secondary | ICD-10-CM | POA: Diagnosis not present

## 2023-01-24 DIAGNOSIS — Z5112 Encounter for antineoplastic immunotherapy: Secondary | ICD-10-CM | POA: Diagnosis not present

## 2023-01-24 DIAGNOSIS — I10 Essential (primary) hypertension: Secondary | ICD-10-CM | POA: Diagnosis not present

## 2023-01-24 DIAGNOSIS — Z87891 Personal history of nicotine dependence: Secondary | ICD-10-CM | POA: Diagnosis not present

## 2023-01-24 DIAGNOSIS — Z818 Family history of other mental and behavioral disorders: Secondary | ICD-10-CM | POA: Diagnosis not present

## 2023-01-24 DIAGNOSIS — C50812 Malignant neoplasm of overlapping sites of left female breast: Secondary | ICD-10-CM | POA: Diagnosis not present

## 2023-01-24 DIAGNOSIS — Z9049 Acquired absence of other specified parts of digestive tract: Secondary | ICD-10-CM | POA: Diagnosis not present

## 2023-01-24 DIAGNOSIS — Z90710 Acquired absence of both cervix and uterus: Secondary | ICD-10-CM | POA: Diagnosis not present

## 2023-01-24 DIAGNOSIS — Z803 Family history of malignant neoplasm of breast: Secondary | ICD-10-CM | POA: Diagnosis not present

## 2023-01-24 DIAGNOSIS — Z8249 Family history of ischemic heart disease and other diseases of the circulatory system: Secondary | ICD-10-CM | POA: Diagnosis not present

## 2023-01-24 DIAGNOSIS — Z8349 Family history of other endocrine, nutritional and metabolic diseases: Secondary | ICD-10-CM | POA: Diagnosis not present

## 2023-01-24 DIAGNOSIS — F419 Anxiety disorder, unspecified: Secondary | ICD-10-CM | POA: Diagnosis not present

## 2023-01-24 DIAGNOSIS — Z95828 Presence of other vascular implants and grafts: Secondary | ICD-10-CM

## 2023-01-24 DIAGNOSIS — D649 Anemia, unspecified: Secondary | ICD-10-CM | POA: Diagnosis not present

## 2023-01-24 DIAGNOSIS — Z170 Estrogen receptor positive status [ER+]: Secondary | ICD-10-CM

## 2023-01-24 DIAGNOSIS — Z833 Family history of diabetes mellitus: Secondary | ICD-10-CM | POA: Diagnosis not present

## 2023-01-24 DIAGNOSIS — Z79899 Other long term (current) drug therapy: Secondary | ICD-10-CM | POA: Diagnosis not present

## 2023-01-24 DIAGNOSIS — C779 Secondary and unspecified malignant neoplasm of lymph node, unspecified: Secondary | ICD-10-CM | POA: Diagnosis not present

## 2023-01-24 DIAGNOSIS — R739 Hyperglycemia, unspecified: Secondary | ICD-10-CM | POA: Diagnosis not present

## 2023-01-24 DIAGNOSIS — Z79811 Long term (current) use of aromatase inhibitors: Secondary | ICD-10-CM | POA: Diagnosis not present

## 2023-01-24 DIAGNOSIS — D696 Thrombocytopenia, unspecified: Secondary | ICD-10-CM | POA: Diagnosis not present

## 2023-01-24 DIAGNOSIS — K7581 Nonalcoholic steatohepatitis (NASH): Secondary | ICD-10-CM | POA: Diagnosis not present

## 2023-01-24 MED ORDER — HEPARIN SOD (PORK) LOCK FLUSH 100 UNIT/ML IV SOLN
500.0000 [IU] | Freq: Once | INTRAVENOUS | Status: AC
  Administered 2023-01-24: 500 [IU] via INTRAVENOUS
  Filled 2023-01-24: qty 5

## 2023-01-24 MED ORDER — SODIUM CHLORIDE 0.9% FLUSH
10.0000 mL | Freq: Once | INTRAVENOUS | Status: AC
  Administered 2023-01-24: 10 mL via INTRAVENOUS
  Filled 2023-01-24: qty 10

## 2023-01-24 NOTE — Telephone Encounter (Signed)
Received fax from covermymeds for glyburide '5mg'$   Key: [id number]

## 2023-01-24 NOTE — Progress Notes (Signed)
Survivorship Care Plan visit completed.  Treatment summary reviewed and given to patient.  ASCO answers booklet reviewed and given to patient.  CARE program and Cancer Transitions discussed with patient along with other resources cancer center offers to patients and caregivers.  Patient verbalized understanding.    

## 2023-01-25 ENCOUNTER — Ambulatory Visit: Payer: 59 | Admitting: Family Medicine

## 2023-01-25 ENCOUNTER — Ambulatory Visit: Payer: 59 | Admitting: Oncology

## 2023-01-25 ENCOUNTER — Other Ambulatory Visit: Payer: 59

## 2023-01-25 ENCOUNTER — Encounter: Payer: Self-pay | Admitting: Family Medicine

## 2023-01-25 VITALS — BP 117/58 | HR 63 | Ht 64.0 in | Wt 190.0 lb

## 2023-01-25 DIAGNOSIS — E1142 Type 2 diabetes mellitus with diabetic polyneuropathy: Secondary | ICD-10-CM | POA: Diagnosis not present

## 2023-01-25 DIAGNOSIS — E1165 Type 2 diabetes mellitus with hyperglycemia: Secondary | ICD-10-CM | POA: Diagnosis not present

## 2023-01-25 DIAGNOSIS — K130 Diseases of lips: Secondary | ICD-10-CM | POA: Diagnosis not present

## 2023-01-25 DIAGNOSIS — K7460 Unspecified cirrhosis of liver: Secondary | ICD-10-CM | POA: Insufficient documentation

## 2023-01-25 DIAGNOSIS — I471 Supraventricular tachycardia, unspecified: Secondary | ICD-10-CM | POA: Insufficient documentation

## 2023-01-25 DIAGNOSIS — E1159 Type 2 diabetes mellitus with other circulatory complications: Secondary | ICD-10-CM | POA: Diagnosis not present

## 2023-01-25 DIAGNOSIS — I152 Hypertension secondary to endocrine disorders: Secondary | ICD-10-CM

## 2023-01-25 DIAGNOSIS — I5043 Acute on chronic combined systolic (congestive) and diastolic (congestive) heart failure: Secondary | ICD-10-CM | POA: Insufficient documentation

## 2023-01-25 LAB — POCT GLYCOSYLATED HEMOGLOBIN (HGB A1C): Hemoglobin A1C: 8 % — AB (ref 4.0–5.6)

## 2023-01-25 MED ORDER — TRIAMCINOLONE ACETONIDE 0.5 % EX OINT: 1.0000 | TOPICAL_OINTMENT | Freq: Two times a day (BID) | CUTANEOUS | 0 refills | Status: DC

## 2023-01-25 MED ORDER — METFORMIN HCL 1000 MG PO TABS: 1000.0000 mg | ORAL_TABLET | Freq: Two times a day (BID) | ORAL | 3 refills | Status: DC

## 2023-01-25 MED ORDER — MUPIROCIN 2 % EX OINT: 1.0000 | TOPICAL_OINTMENT | Freq: Every day | CUTANEOUS | 0 refills | Status: DC

## 2023-01-25 MED ORDER — GLYBURIDE 5 MG PO TABS: 5.0000 mg | ORAL_TABLET | Freq: Two times a day (BID) | ORAL | 3 refills | Status: DC

## 2023-01-25 NOTE — Assessment & Plan Note (Signed)
Chronic, stable A1c increased +1 edema in ankles Reports normal foot checks daily

## 2023-01-25 NOTE — Progress Notes (Signed)
Established patient visit  Patient: Emily Tapia   DOB: [birth year]   62 y.o. Female  MRN: [id number] Visit Date: 01/25/2023  Today's healthcare provider: [name], [name]  Introduced to nurse practitioner role and practice setting.  All questions answered.  Discussed provider/patient relationship and expectations.  Subjective    HPI HPI   Diabetes f/u Last edited by Elta Guadeloupe, CMA on 01/25/2023  3:10 PM.      Medications: Outpatient Medications Prior to Visit  Medication Sig   amLODipine (NORVASC) 5 MG tablet Take 1 tablet by mouth once daily   Ascorbic Acid (VITAMIN C PO) Take 1 tablet by mouth daily.   aspirin 81 MG EC tablet Take 81 mg by mouth at bedtime.   carvedilol (COREG) 25 MG tablet Take 1 tablet by mouth twice daily   Cholecalciferol (VITAMIN D3) 1000 UNITS CAPS Take 1,000 Units by mouth at bedtime.   diphenoxylate-atropine (LOMOTIL) 2.5-0.025 MG tablet Take 2 tablets by mouth 4 (four) times daily as needed for diarrhea or loose stools.   Ferrous Sulfate (IRON) 325 (65 Fe) MG TABS Take 1 tablet by mouth once daily with breakfast   furosemide (LASIX) 40 MG tablet TAKE 1 TABLET BY MOUTH ONCE DAILY AS NEEDED FOR FLUID OR EDEMA   glucose blood (CONTOUR NEXT TEST) test strip Test fasting sugar each morning. Recheck if having hypoglycemic symptoms.   hydrochlorothiazide (HYDRODIURIL) 25 MG tablet Take 1 tablet by mouth once daily   hydrocortisone (ANUSOL-HC) 2.5 % rectal cream Place 1 Application rectally 2 (two) times daily.   letrozole (FEMARA) 2.5 MG tablet Take 1 tablet (2.5 mg total) by mouth daily.   levothyroxine (EUTHYROX) 75 MCG tablet TAKE 1 TABLET BY MOUTH ONCE DAILY BEFORE BREAKFAST .   lisinopril (ZESTRIL) 40 MG tablet Take 1 tablet by mouth once daily   Multiple Vitamin (MULTIVITAMIN) tablet Take 1 tablet by mouth every morning.   omeprazole (PRILOSEC) 40 MG capsule TAKE 1 CAPSULE BY MOUTH ONCE DAILY BEFORE BREAKFAST   PARoxetine (PAXIL) 20 MG  tablet Take 1 tablet (20 mg total) by mouth daily.   traMADol (ULTRAM) 50 MG tablet Take 1 tablet (50 mg total) by mouth every 6 (six) hours as needed (pain).   zinc gluconate 50 MG tablet Take 50 mg by mouth daily.   [DISCONTINUED] glyBURIDE (DIABETA) 5 MG tablet TAKE 1 TABLET BY MOUTH TWICE DAILY WITH MEALS   [DISCONTINUED] metFORMIN (GLUCOPHAGE) 1000 MG tablet TAKE 1 TABLET BY MOUTH TWICE DAILY WITH MEALS   No facility-administered medications prior to visit.    Review of Systems  Last CBC Lab Results  Component Value Date   WBC 4.8 01/23/2023   HGB 9.8 (L) 01/23/2023   HCT 32.2 (L) 01/23/2023   MCV 81.9 01/23/2023   MCH 24.9 (L) 01/23/2023   RDW 15.8 (H) 01/23/2023   PLT 104 (L) [id number]   Last metabolic panel Lab Results  Component Value Date   GLUCOSE 317 (H) 01/23/2023   NA 136 01/23/2023   K 3.8 01/23/2023   CL 99 01/23/2023   CO2 26 01/23/2023   BUN 21 01/23/2023   CREATININE 0.95 01/23/2023   GFRNONAA >60 01/23/2023   CALCIUM 8.7 (L) 01/23/2023   PROT 6.9 01/23/2023   ALBUMIN 4.0 01/23/2023   LABGLOB 2.2 07/14/2021   AGRATIO 2.1 07/14/2021   BILITOT 0.3 01/23/2023   ALKPHOS 59 01/23/2023   AST 36 01/23/2023   ALT 33 01/23/2023   ANIONGAP 11 01/23/2023  Last lipids Lab Results  Component Value Date   CHOL 183 07/14/2021   HDL 38 (L) 07/14/2021   LDLCALC 120 (H) 07/14/2021   TRIG 142 07/14/2021   CHOLHDL 5.2 (H) 08/14/2019   Last hemoglobin A1c Lab Results  Component Value Date   HGBA1C 8.0 (A) 01/25/2023   Last thyroid functions Lab Results  Component Value Date   TSH 2.540 07/14/2021   T4TOTAL 12.7 (H) 08/14/2019       Objective    BP (!) 117/58 (BP Location: Right Arm, Patient Position: Sitting, Cuff Size: Normal)   Pulse 63   Ht 5' 4"$  (1.626 m)   Wt 190 lb (86.2 kg)   SpO2 98%   BMI 32.61 kg/m   BP Readings from Last 3 Encounters:  01/25/23 (!) 117/58  01/23/23 132/70  11/07/22 136/69   Wt Readings from Last 3 Encounters:   01/25/23 190 lb (86.2 kg)  01/23/23 189 lb 8 oz (86 kg)  11/07/22 187 lb (84.8 kg)   SpO2 Readings from Last 3 Encounters:  01/25/23 98%  11/07/22 95%  10/25/22 96%   Physical Exam Vitals and nursing note reviewed.  Constitutional:      General: She is not in acute distress.    Appearance: Normal appearance. She is obese. She is not ill-appearing, toxic-appearing or diaphoretic.  HENT:     Head: Normocephalic and atraumatic.  Cardiovascular:     Rate and Rhythm: Normal rate and regular rhythm.     Pulses: Normal pulses.     Heart sounds: Normal heart sounds. No murmur heard.    No friction rub. No gallop.  Pulmonary:     Effort: Pulmonary effort is normal. No respiratory distress.     Breath sounds: Normal breath sounds. No stridor. No wheezing, rhonchi or rales.  Chest:     Chest wall: No tenderness.  Abdominal:     General: Bowel sounds are normal.     Palpations: Abdomen is soft.  Musculoskeletal:        General: Swelling present. No tenderness, deformity or signs of injury. Normal range of motion.     Right lower leg: Edema present.     Left lower leg: Edema present.  Skin:    General: Skin is warm and dry.     Capillary Refill: Capillary refill takes less than 2 seconds.     Coloration: Skin is not jaundiced or pale.     Findings: No bruising, erythema, lesion or rash.  Neurological:     General: No focal deficit present.     Mental Status: She is alert and oriented to person, place, and time. Mental status is at baseline.     Cranial Nerves: No cranial nerve deficit.     Sensory: No sensory deficit.     Motor: No weakness.     Coordination: Coordination normal.  Psychiatric:        Mood and Affect: Mood normal.        Behavior: Behavior normal.        Thought Content: Thought content normal.        Judgment: Judgment normal.    Results for orders placed or performed in visit on 01/25/23  POCT HgB A1C  Result Value Ref Range   Hemoglobin A1C 8.0 (A) 4.0 -  5.6 %   HbA1c POC (<> result, manual entry)     HbA1c, POC (prediabetic range)     HbA1c, POC (controlled diabetic range)      Assessment &  Plan     Problem List Items Addressed This Visit       Cardiovascular and Mediastinum   Hypertension associated with diabetes (HCC)    Chronic, stable Continue Norvasc 5, ASA 81, Coreg 25 mg, HCTZ 25, Lasix 40 PRN      Relevant Medications   glyBURIDE (DIABETA) 5 MG tablet   metFORMIN (GLUCOPHAGE) 1000 MG tablet     Digestive   Angular cheilitis    Acute, unknown cause Alternate anti bacterial ointment and steroid RTC as needed         Endocrine   Diabetic peripheral neuropathy associated with type 2 diabetes mellitus (HCC)    Chronic, stable A1c increased +1 edema in ankles Reports normal foot checks daily      Relevant Medications   glyBURIDE (DIABETA) 5 MG tablet   metFORMIN (GLUCOPHAGE) 1000 MG tablet   Type 2 diabetes mellitus with hyperglycemia, without long-term current use of insulin (HCC) - Primary    Chronic, uncontrolled Goal <7% Pt notes falling off wagon as she finished treatment for cancer Continue metformin 1000 mg BID and glyburide 5 mg BID Continue to recommend balanced, lower carb meals. Smaller meal size, adding snacks. Choosing water as drink of choice and increasing purposeful exercise. 3 month f/u Encouraged to make DM eye appt       Relevant Medications   glyBURIDE (DIABETA) 5 MG tablet   metFORMIN (GLUCOPHAGE) 1000 MG tablet   Other Relevant Orders   POCT HgB A1C (Completed)   Return in about 3 months (around 04/25/2023), or if symptoms worsen or fail to improve, for annual examination.     Vonna Kotyk, [name], have reviewed all documentation for this visit. The documentation on 01/25/23 for the exam, diagnosis, procedures, and orders are all accurate and complete.  [name], [place] [phone] (phone) [phone] (fax)  [place]

## 2023-01-25 NOTE — Assessment & Plan Note (Signed)
Acute, unknown cause Alternate anti bacterial ointment and steroid RTC as needed

## 2023-01-25 NOTE — Assessment & Plan Note (Signed)
Chronic, stable Continue Norvasc 5, ASA 81, Coreg 25 mg, HCTZ 25, Lasix 40 PRN

## 2023-01-25 NOTE — Assessment & Plan Note (Signed)
Chronic, uncontrolled Goal <7% Pt notes falling off wagon as she finished treatment for cancer Continue metformin 1000 mg BID and glyburide 5 mg BID Continue to recommend balanced, lower carb meals. Smaller meal size, adding snacks. Choosing water as drink of choice and increasing purposeful exercise. 3 month f/u Encouraged to make DM eye appt

## 2023-01-29 ENCOUNTER — Other Ambulatory Visit: Payer: Self-pay

## 2023-01-30 NOTE — Telephone Encounter (Signed)
Start PA-(Key: [id number]) [id number] glyBURIDE 5MG tablets

## 2023-02-01 ENCOUNTER — Other Ambulatory Visit: Payer: Self-pay | Admitting: Family Medicine

## 2023-02-01 MED ORDER — GLIPIZIDE 5 MG PO TABS: 5.0000 mg | ORAL_TABLET | Freq: Two times a day (BID) | ORAL | 3 refills | Status: DC

## 2023-02-01 MED ORDER — SITAGLIPTIN PHOSPHATE 100 MG PO TABS: 100.0000 mg | ORAL_TABLET | Freq: Every day | ORAL | 3 refills | Status: DC

## 2023-02-01 NOTE — Telephone Encounter (Signed)
Pt stated the pharmacy advised her insurance did not cover the medication glyBURIDE (DIABETA) 5 MG table. However, they cover the medication Glipizide.  Pt is requesting a callback to discuss seeking clinical advice.   Please advise.

## 2023-02-04 ENCOUNTER — Ambulatory Visit (INDEPENDENT_AMBULATORY_CARE_PROVIDER_SITE_OTHER): Payer: 59 | Admitting: Gastroenterology

## 2023-02-04 ENCOUNTER — Ambulatory Visit: Payer: 59

## 2023-02-04 ENCOUNTER — Encounter: Payer: Self-pay | Admitting: Gastroenterology

## 2023-02-04 DIAGNOSIS — Z23 Encounter for immunization: Secondary | ICD-10-CM | POA: Diagnosis not present

## 2023-02-04 NOTE — Progress Notes (Signed)
Gave patient the Hepatis A and B vaccine in right deltoid. Patient tolerated the injections okay with no side effects

## 2023-02-11 ENCOUNTER — Ambulatory Visit: Payer: 59

## 2023-02-16 ENCOUNTER — Other Ambulatory Visit: Payer: Self-pay | Admitting: Family Medicine

## 2023-02-17 ENCOUNTER — Other Ambulatory Visit: Payer: Self-pay | Admitting: Family Medicine

## 2023-02-18 NOTE — Telephone Encounter (Signed)
Requested Prescriptions  Pending Prescriptions Disp Refills   lisinopril (ZESTRIL) 40 MG tablet [Pharmacy Med Name: Lisinopril 40 MG Oral Tablet] 30 tablet 2    Sig: Take 1 tablet by mouth once daily     Cardiovascular:  ACE Inhibitors Passed - 02/17/2023  7:50 AM      Passed - Cr in normal range and within 180 days    Creatinine, Ser  Date Value Ref Range Status  01/23/2023 0.95 0.44 - 1.00 mg/dL Final   Creatinine, POC  Date Value Ref Range Status  02/24/2016 NA mg/dL Final         Passed - K in normal range and within 180 days    Potassium  Date Value Ref Range Status  01/23/2023 3.8 3.5 - 5.1 mmol/L Final         Passed - Patient is not pregnant      Passed - Last BP in normal range    BP Readings from Last 1 Encounters:  01/25/23 (!) 117/58         Passed - Valid encounter within last 6 months    Recent Outpatient Visits           3 weeks ago Type 2 diabetes mellitus with hyperglycemia, without long-term current use of insulin ([place])   [place] Tally Joe T, [name]   8 months ago Type 2 diabetes mellitus with hyperglycemia, without long-term current use of insulin [facility])   [place] Tally Joe T, [name]   11 months ago Type 2 diabetes mellitus with hyperglycemia, without long-term current use of insulin [facility])   [place] Tally Joe T, [name]   1 year ago Type 2 diabetes mellitus without complication, without long-term current use of insulin [facility])   [place] Tally Joe T, [name]   1 year ago Essential (primary) hypertension   [place], Vickki Muff, PA-C       Future Appointments             In 2 months Rollene Rotunda, Jaci Standard, [place], [place]

## 2023-02-18 NOTE — Telephone Encounter (Signed)
Requested Prescriptions  Pending Prescriptions Disp Refills   hydrochlorothiazide (HYDRODIURIL) 25 MG tablet [Pharmacy Med Name: hydroCHLOROthiazide 25 MG Oral Tablet] 90 tablet 0    Sig: Take 1 tablet by mouth once daily     Cardiovascular: Diuretics - Thiazide Passed - 02/16/2023  1:04 PM      Passed - Cr in normal range and within 180 days    Creatinine, Ser  Date Value Ref Range Status  01/23/2023 0.95 0.44 - 1.00 mg/dL Final   Creatinine, POC  Date Value Ref Range Status  02/24/2016 NA mg/dL Final         Passed - K in normal range and within 180 days    Potassium  Date Value Ref Range Status  01/23/2023 3.8 3.5 - 5.1 mmol/L Final         Passed - Na in normal range and within 180 days    Sodium  Date Value Ref Range Status  01/23/2023 136 135 - 145 mmol/L Final  11/19/2022 136 134 - 144 mmol/L Final         Passed - Last BP in normal range    BP Readings from Last 1 Encounters:  01/25/23 (!) 117/58         Passed - Valid encounter within last 6 months    Recent Outpatient Visits           3 weeks ago Type 2 diabetes mellitus with hyperglycemia, without long-term current use of insulin ([place])   [place] Tally Joe T, [name]   8 months ago Type 2 diabetes mellitus with hyperglycemia, without long-term current use of insulin [facility])   [place] [employer] Tally Joe T, [name]   11 months ago Type 2 diabetes mellitus with hyperglycemia, without long-term current use of insulin [facility])   [place] Tally Joe T, [name]   1 year ago Type 2 diabetes mellitus without complication, without long-term current use of insulin [facility])   [place] Tally Joe T, [name]   1 year ago Essential (primary) hypertension   [place], Vickki Muff, PA-C       Future Appointments             In 2 months Rollene Rotunda, Jaci Standard, [place], [place]

## 2023-02-19 ENCOUNTER — Ambulatory Visit
Admission: RE | Admit: 2023-02-19 | Discharge: 2023-02-19 | Disposition: A | Discharge status: Home / Self Care | Payer: 59 | Source: Ambulatory Visit | Attending: Oncology | Admitting: Oncology

## 2023-02-19 DIAGNOSIS — Z170 Estrogen receptor positive status [ER+]: Secondary | ICD-10-CM | POA: Diagnosis not present

## 2023-02-19 DIAGNOSIS — C50812 Malignant neoplasm of overlapping sites of left female breast: Secondary | ICD-10-CM | POA: Insufficient documentation

## 2023-02-19 DIAGNOSIS — Z780 Asymptomatic menopausal state: Secondary | ICD-10-CM | POA: Diagnosis not present

## 2023-03-20 ENCOUNTER — Inpatient Hospital Stay: Payer: 59 | Attending: Oncology

## 2023-03-20 DIAGNOSIS — Z170 Estrogen receptor positive status [ER+]: Secondary | ICD-10-CM | POA: Diagnosis not present

## 2023-03-20 DIAGNOSIS — C50812 Malignant neoplasm of overlapping sites of left female breast: Secondary | ICD-10-CM | POA: Diagnosis not present

## 2023-03-20 DIAGNOSIS — Z95828 Presence of other vascular implants and grafts: Secondary | ICD-10-CM

## 2023-03-20 DIAGNOSIS — Z452 Encounter for adjustment and management of vascular access device: Secondary | ICD-10-CM | POA: Insufficient documentation

## 2023-03-20 MED ORDER — HEPARIN SOD (PORK) LOCK FLUSH 100 UNIT/ML IV SOLN
500.0000 [IU] | Freq: Once | INTRAVENOUS | Status: AC
  Administered 2023-03-20: 500 [IU] via INTRAVENOUS
  Filled 2023-03-20: qty 5

## 2023-03-20 MED ORDER — SODIUM CHLORIDE 0.9% FLUSH
10.0000 mL | INTRAVENOUS | Status: DC | PRN
  Administered 2023-03-20: 10 mL via INTRAVENOUS
  Filled 2023-03-20: qty 10

## 2023-03-20 NOTE — Patient Instructions (Signed)

## 2023-04-21 ENCOUNTER — Other Ambulatory Visit: Payer: Self-pay | Admitting: Family Medicine

## 2023-04-23 NOTE — Telephone Encounter (Signed)
Requested Prescriptions  Pending Prescriptions Disp Refills   levothyroxine (SYNTHROID) 75 MCG tablet [Pharmacy Med Name: Levothyroxine Sodium 75 MCG Oral Tablet] 90 tablet 0    Sig: TAKE 1 TABLET BY MOUTH ONCE DAILY BEFORE BREAKFAST     Endocrinology:  Hypothyroid Agents Failed - 04/21/2023  4:38 PM      Failed - TSH in normal range and within 360 days    TSH  Date Value Ref Range Status  07/14/2021 2.540 0.450 - 4.500 uIU/mL Final         Passed - Valid encounter within last 12 months    Recent Outpatient Visits           2 months ago Type 2 diabetes mellitus with hyperglycemia, without long-term current use of insulin [facility])   [place] [facility] Merita Norton T, [name]   10 months ago Type 2 diabetes mellitus with hyperglycemia, without long-term current use of insulin [facility])   [place] [facility] Merita Norton T, [name]   1 year ago Type 2 diabetes mellitus with hyperglycemia, without long-term current use of insulin [facility]   [place] [facility] Merita Norton T, [name]   1 year ago Type 2 diabetes mellitus without complication, without long-term current use of insulin [facility])   [place] [facility] Merita Norton T, [name]   1 year ago Essential (primary) hypertension   [place] [facility] Chrism[place], Jodell Cipro, PA-C       Future Appointments             In 1 week Jacky Kindle, [name] [facility]

## 2023-04-26 ENCOUNTER — Encounter: Payer: 59 | Admitting: Family Medicine

## 2023-05-03 ENCOUNTER — Ambulatory Visit (INDEPENDENT_AMBULATORY_CARE_PROVIDER_SITE_OTHER): Payer: 59 | Admitting: Family Medicine

## 2023-05-03 ENCOUNTER — Encounter: Payer: Self-pay | Admitting: Family Medicine

## 2023-05-03 VITALS — BP 136/60 | HR 67 | Ht 64.0 in | Wt 194.0 lb

## 2023-05-03 DIAGNOSIS — I350 Nonrheumatic aortic (valve) stenosis: Secondary | ICD-10-CM

## 2023-05-03 DIAGNOSIS — E1159 Type 2 diabetes mellitus with other circulatory complications: Secondary | ICD-10-CM | POA: Diagnosis not present

## 2023-05-03 DIAGNOSIS — E1169 Type 2 diabetes mellitus with other specified complication: Secondary | ICD-10-CM | POA: Diagnosis not present

## 2023-05-03 DIAGNOSIS — Z0000 Encounter for general adult medical examination without abnormal findings: Secondary | ICD-10-CM | POA: Insufficient documentation

## 2023-05-03 DIAGNOSIS — K7460 Unspecified cirrhosis of liver: Secondary | ICD-10-CM

## 2023-05-03 DIAGNOSIS — E1165 Type 2 diabetes mellitus with hyperglycemia: Secondary | ICD-10-CM | POA: Diagnosis not present

## 2023-05-03 DIAGNOSIS — R601 Generalized edema: Secondary | ICD-10-CM

## 2023-05-03 DIAGNOSIS — I471 Supraventricular tachycardia, unspecified: Secondary | ICD-10-CM | POA: Insufficient documentation

## 2023-05-03 DIAGNOSIS — I152 Hypertension secondary to endocrine disorders: Secondary | ICD-10-CM | POA: Diagnosis not present

## 2023-05-03 DIAGNOSIS — E1142 Type 2 diabetes mellitus with diabetic polyneuropathy: Secondary | ICD-10-CM

## 2023-05-03 DIAGNOSIS — E785 Hyperlipidemia, unspecified: Secondary | ICD-10-CM | POA: Diagnosis not present

## 2023-05-03 DIAGNOSIS — I5043 Acute on chronic combined systolic (congestive) and diastolic (congestive) heart failure: Secondary | ICD-10-CM

## 2023-05-03 DIAGNOSIS — R350 Frequency of micturition: Secondary | ICD-10-CM

## 2023-05-03 DIAGNOSIS — E559 Vitamin D deficiency, unspecified: Secondary | ICD-10-CM | POA: Diagnosis not present

## 2023-05-03 DIAGNOSIS — E039 Hypothyroidism, unspecified: Secondary | ICD-10-CM

## 2023-05-03 DIAGNOSIS — F411 Generalized anxiety disorder: Secondary | ICD-10-CM

## 2023-05-03 LAB — POCT URINALYSIS DIPSTICK
Bilirubin, UA: NEGATIVE
Blood, UA: NEGATIVE
Glucose, UA: NEGATIVE
Ketones, UA: 5
Leukocytes, UA: NEGATIVE
Nitrite, UA: NEGATIVE
Protein, UA: NEGATIVE
Spec Grav, UA: >=1.03 — AB (ref 1.010–1.025)
Urobilinogen, UA: 0.2 E.U./dL
pH, UA: 5 (ref 5.0–8.0)

## 2023-05-03 MED ORDER — CARVEDILOL 25 MG PO TABS: 25.0000 mg | ORAL_TABLET | Freq: Two times a day (BID) | ORAL | 1 refills | Status: DC

## 2023-05-03 MED ORDER — PAROXETINE HCL 20 MG PO TABS: 20.0000 mg | ORAL_TABLET | Freq: Every day | ORAL | 1 refills | Status: DC

## 2023-05-03 MED ORDER — LISINOPRIL 40 MG PO TABS: 40.0000 mg | ORAL_TABLET | Freq: Every day | ORAL | 1 refills | Status: DC

## 2023-05-03 MED ORDER — AMLODIPINE BESYLATE 5 MG PO TABS: 5.0000 mg | ORAL_TABLET | Freq: Every day | ORAL | 1 refills | Status: DC

## 2023-05-03 MED ORDER — GLIPIZIDE 5 MG PO TABS: 5.0000 mg | ORAL_TABLET | Freq: Two times a day (BID) | ORAL | 1 refills | Status: DC

## 2023-05-03 NOTE — Progress Notes (Signed)
Complete physical exam   Patient: Emily Tapia   DOB: September 13, 1961   62 y.o. Female  MRN: [id number] Visit Date: 05/03/2023  Today's healthcare provider: Jacky Kindle, [name]  Introduced to nurse practitioner role and practice setting.  All questions answered.  Discussed provider/patient relationship and expectations.  Chief Complaint  Patient presents with   Annual Exam   Subjective    Emily Tapia is a 62 y.o. female who presents today for a complete physical exam.  She reports consuming a general diet. The patient does not participate in regular exercise at present. She generally feels fairly well. She reports sleeping fairly well. She does have additional problems to discuss today.   HPI   Past Medical History:  Diagnosis Date   Anxiety    Aortic stenosis    Cirrhosis (HCC)    Diabetes mellitus    Edema    Heart murmur    Hypertension    Sleep apnea    Thyroid disease    Past Surgical History:  Procedure Laterality Date   ABDOMINAL HYSTERECTOMY  11/17/2013   BREAST BIOPSY     x2   BREAST BIOPSY Left 07/20/2021   [place] Bx, Ribbon Clip, [facility]   BREAST BIOPSY Left 07/20/2021   Stereo Bx, X-clip, DCIS   BREAST BIOPSY Left 07/20/2021   Stereo biopsy, coil clip, DCIS   BREAST BIOPSY Right 07/20/2021   Stereo Bx, Ribbon Clip, Fat necrosis   CESAREAN SECTION     x 2   CHOLECYSTECTOMY  12/17/2001   DUE TO STONES   COLONOSCOPY WITH PROPOFOL N/A 07/26/2022   Procedure: COLONOSCOPY WITH PROPOFOL;  Surgeon: Toney Reil, MD;  Location: [facility] ENDOSCOPY;  Service: Gastroenterology;  Laterality: N/A;   DILATION AND CURETTAGE OF UTERUS     ELBOW SURGERY     ESOPHAGOGASTRODUODENOSCOPY (EGD) WITH PROPOFOL N/A 07/26/2022   Procedure: ESOPHAGOGASTRODUODENOSCOPY (EGD) WITH PROPOFOL;  Surgeon: Toney Reil, MD;  Location: [facility] ENDOSCOPY;  Service: Gastroenterology;  Laterality: N/A;   POLYPECTOMY  12/17/1993   PORTACATH PLACEMENT Right 10/25/2021    Procedure: INSERTION PORT-A-CATH;  Surgeon: Campbell Lerner, MD;  Location: [facility] ORS;  Service: General;  Laterality: Right;   SIMPLE MASTECTOMY WITH AXILLARY SENTINEL NODE BIOPSY Left 08/16/2021   Procedure: SIMPLE MASTECTOMY WITH AXILLARY SENTINEL NODE BIOPSY;  Surgeon: Campbell Lerner, MD;  Location: [facility] ORS;  Service: General;  Laterality: Left;   Social History   Socioeconomic History   Marital status: Married    Spouse name: Sherilyn Cooter   Number of children: 2   Years of education: 15   Highest education level: Not on file  Occupational History   Occupation: [employer]  Tobacco Use   Smoking status: Former    Packs/day: 1.50    Years: 25.00    Additional pack years: 0.00    Total pack years: 37.50    Types: Cigarettes    Quit date: 11/24/2001    Years since quitting: 21.4   Smokeless tobacco: Never  Vaping Use   Vaping Use: Never used  Substance and Sexual Activity   Alcohol use: Yes    Alcohol/week: 0.0 standard drinks of alcohol    Comment: OCCASIONALLY DRINKS WINE, ONCE A WEEK   Drug use: No   Sexual activity: Yes  Other Topics Concern   Not on file  Social History Narrative   Not on file   Social Determinants of Health   Financial Resource Strain: Not on file  Food Insecurity: Not on  file  Transportation Needs: Not on file  Physical Activity: Not on file  Stress: Not on file  Social Connections: Not on file  Intimate Partner Violence: Not on file   Family Status  Relation Name Status   Mother  Alive   Father  Deceased   Brother 1 Alive   MGM  Deceased   PGM  Deceased   PGF  Deceased   Brother 2 Alive   Sister  Estate manager/land agent   Daughter  Alive   Family History  Problem Relation Age of Onset   Hyperlipidemia Mother    Thyroid disease Mother    Hashimoto's thyroiditis Mother    Heart disease Father        open heart surgery   Heart failure Father    Diabetes Father    Dementia Father    Thyroid disease Brother    Breast cancer Maternal  Grandmother    Diabetes Paternal Grandmother    Prostate cancer Paternal Grandfather    Allergies  Allergen Reactions   Fosaprepitant Nausea Only and Other (See Comments)    Back pain, nausea. See progress note from 10/27/2021     Patient Care Team: Jacky Kindle, [name] as PCP - General (Family Medicine) Debbe Odea, MD as PCP - Cardiology (Cardiology) Campbell Lerner, MD as Consulting Physician (General Surgery) Jeralyn Ruths, MD as Consulting Physician (Hematology and Oncology)   Medications: Outpatient Medications Prior to Visit  Medication Sig   Ascorbic Acid (VITAMIN C PO) Take 1 tablet by mouth daily.   aspirin 81 MG EC tablet Take 81 mg by mouth at bedtime.   Cholecalciferol (VITAMIN D3) 1000 UNITS CAPS Take 1,000 Units by mouth at bedtime.   diphenoxylate-atropine (LOMOTIL) 2.5-0.025 MG tablet Take 2 tablets by mouth 4 (four) times daily as needed for diarrhea or loose stools.   furosemide (LASIX) 40 MG tablet TAKE 1 TABLET BY MOUTH ONCE DAILY AS NEEDED FOR FLUID OR EDEMA   glucose blood (CONTOUR NEXT TEST) test strip Test fasting sugar each morning. Recheck if having hypoglycemic symptoms.   hydrochlorothiazide (HYDRODIURIL) 25 MG tablet Take 1 tablet by mouth once daily   hydrocortisone (ANUSOL-HC) 2.5 % rectal cream Place 1 Application rectally 2 (two) times daily.   letrozole (FEMARA) 2.5 MG tablet Take 1 tablet (2.5 mg total) by mouth daily.   levothyroxine (SYNTHROID) 75 MCG tablet TAKE 1 TABLET BY MOUTH ONCE DAILY BEFORE BREAKFAST   metFORMIN (GLUCOPHAGE) 1000 MG tablet Take 1 tablet (1,000 mg total) by mouth 2 (two) times daily with a meal.   Multiple Vitamin (MULTIVITAMIN) tablet Take 1 tablet by mouth every morning.   mupirocin ointment (BACTROBAN) 2 % Apply 1 Application topically daily.   omeprazole (PRILOSEC) 40 MG capsule TAKE 1 CAPSULE BY MOUTH ONCE DAILY BEFORE BREAKFAST   sitaGLIPtin (JANUVIA) 100 MG tablet Take 1 tablet (100 mg total) by mouth  daily.   traMADol (ULTRAM) 50 MG tablet Take 1 tablet (50 mg total) by mouth every 6 (six) hours as needed (pain).   triamcinolone ointment (KENALOG) 0.5 % Apply 1 Application topically 2 (two) times daily.   zinc gluconate 50 MG tablet Take 50 mg by mouth daily.   [DISCONTINUED] amLODipine (NORVASC) 5 MG tablet Take 1 tablet by mouth once daily   [DISCONTINUED] carvedilol (COREG) 25 MG tablet Take 1 tablet by mouth twice daily   [DISCONTINUED] Ferrous Sulfate (IRON) 325 (65 Fe) MG TABS Take 1 tablet by mouth once daily with breakfast   [  DISCONTINUED] glipiZIDE (GLUCOTROL) 5 MG tablet Take 1 tablet (5 mg total) by mouth 2 (two) times daily before a meal.   [DISCONTINUED] lisinopril (ZESTRIL) 40 MG tablet Take 1 tablet by mouth once daily   [DISCONTINUED] PARoxetine (PAXIL) 20 MG tablet Take 1 tablet (20 mg total) by mouth daily.   No facility-administered medications prior to visit.   Review of Systems  Last CBC Lab Results  Component Value Date   WBC 4.8 01/23/2023   HGB 9.8 (L) 01/23/2023   HCT 32.2 (L) 01/23/2023   MCV 81.9 01/23/2023   MCH 24.9 (L) 01/23/2023   RDW 15.8 (H) 01/23/2023   PLT 104 (L) 01/23/2023   Last metabolic panel Lab Results  Component Value Date   GLUCOSE 317 (H) 01/23/2023   NA 136 01/23/2023   K 3.8 01/23/2023   CL 99 01/23/2023   CO2 26 01/23/2023   BUN 21 01/23/2023   CREATININE 0.95 01/23/2023   GFRNONAA >60 01/23/2023   CALCIUM 8.7 (L) 01/23/2023   PROT 6.9 01/23/2023   ALBUMIN 4.0 01/23/2023   LABGLOB 2.2 07/14/2021   AGRATIO 2.1 07/14/2021   BILITOT 0.3 01/23/2023   ALKPHOS 59 01/23/2023   AST 36 01/23/2023   ALT 33 01/23/2023   ANIONGAP 11 01/23/2023   Last lipids Lab Results  Component Value Date   CHOL 183 07/14/2021   HDL 38 (L) 07/14/2021   LDLCALC 120 (H) 07/14/2021   TRIG 142 07/14/2021   CHOLHDL 5.2 (H) 08/14/2019   Last hemoglobin A1c Lab Results  Component Value Date   HGBA1C 8.0 (A) 01/25/2023   Last thyroid  functions Lab Results  Component Value Date   TSH 2.540 07/14/2021   T4TOTAL 12.7 (H) 08/14/2019    Objective    BP 136/60 (BP Location: Right Arm, Patient Position: Sitting, Cuff Size: Large)   Pulse 67   Ht 5\' 4"  (1.626 m)   Wt 194 lb (88 kg)   SpO2 98%   BMI 33.30 kg/m   BP Readings from Last 3 Encounters:  05/03/23 136/60  01/25/23 (!) 117/58  01/23/23 132/70   Wt Readings from Last 3 Encounters:  05/03/23 194 lb (88 kg)  01/25/23 190 lb (86.2 kg)  01/23/23 189 lb 8 oz (86 kg)   SpO2 Readings from Last 3 Encounters:  05/03/23 98%  01/25/23 98%  11/07/22 95%   Physical Exam Vitals and nursing note reviewed.  Constitutional:      General: She is awake. She is not in acute distress.    Appearance: Normal appearance. She is well-developed and well-groomed. She is obese. She is not ill-appearing, toxic-appearing or diaphoretic.  HENT:     Head: Normocephalic and atraumatic.     Jaw: There is normal jaw occlusion. No trismus, tenderness, swelling or pain on movement.     Right Ear: Hearing, tympanic membrane, ear canal and external ear normal. There is no impacted cerumen.     Left Ear: Hearing, tympanic membrane, ear canal and external ear normal. There is no impacted cerumen.     Nose: Nose normal. No congestion or rhinorrhea.     Right Turbinates: Not enlarged, swollen or pale.     Left Turbinates: Not enlarged, swollen or pale.     Right Sinus: No maxillary sinus tenderness or frontal sinus tenderness.     Left Sinus: No maxillary sinus tenderness or frontal sinus tenderness.     Mouth/Throat:     Lips: Pink.     Mouth: Mucous membranes are  moist. No injury.     Tongue: No lesions.     Pharynx: Oropharynx is clear. Uvula midline. No pharyngeal swelling, oropharyngeal exudate, posterior oropharyngeal erythema or uvula swelling.     Tonsils: No tonsillar exudate or tonsillar abscesses.  Eyes:     General: Lids are normal. Lids are everted, no foreign bodies  appreciated. Vision grossly intact. Gaze aligned appropriately. No allergic shiner or visual field deficit.       Right eye: No discharge.        Left eye: No discharge.     Extraocular Movements: Extraocular movements intact.     Conjunctiva/sclera: Conjunctivae normal.     Right eye: Right conjunctiva is not injected. No exudate.    Left eye: Left conjunctiva is not injected. No exudate.    Pupils: Pupils are equal, round, and reactive to light.  Neck:     Thyroid: No thyroid mass, thyromegaly or thyroid tenderness.     Vascular: No carotid bruit.     Trachea: Trachea normal.  Cardiovascular:     Rate and Rhythm: Normal rate and regular rhythm.     Pulses: Normal pulses.          Carotid pulses are 2+ on the right side and 2+ on the left side.      Radial pulses are 2+ on the right side and 2+ on the left side.       Dorsalis pedis pulses are 2+ on the right side and 2+ on the left side.       Posterior tibial pulses are 2+ on the right side and 2+ on the left side.     Heart sounds: Normal heart sounds, S1 normal and S2 normal. No murmur heard.    No friction rub. No gallop.  Pulmonary:     Effort: Pulmonary effort is normal. No respiratory distress.     Breath sounds: Normal breath sounds and air entry. No stridor. No wheezing, rhonchi or rales.  Chest:     Chest wall: No tenderness.  Abdominal:     General: Abdomen is flat. Bowel sounds are normal. There is distension.     Palpations: Abdomen is soft. There is no mass.     Tenderness: There is abdominal tenderness. There is left CVA tenderness. There is no right CVA tenderness, guarding or rebound.     Hernia: No hernia is present.  Genitourinary:    Comments: Exam deferred; LU abdominal tenderness with L CVA tenderness and complaints of urinary frequency  Musculoskeletal:        General: No swelling, tenderness, deformity or signs of injury. Normal range of motion.     Cervical back: Full passive range of motion without pain,  normal range of motion and neck supple. No edema, rigidity or tenderness. No muscular tenderness.     Right lower leg: No edema.     Left lower leg: No edema.  Lymphadenopathy:     Cervical: No cervical adenopathy.     Right cervical: No superficial, deep or posterior cervical adenopathy.    Left cervical: No superficial, deep or posterior cervical adenopathy.  Skin:    General: Skin is warm and dry.     Capillary Refill: Capillary refill takes less than 2 seconds.     Coloration: Skin is not jaundiced or pale.     Findings: No bruising, erythema, lesion or rash.  Neurological:     General: No focal deficit present.     Mental Status: She is alert  and oriented to person, place, and time. Mental status is at baseline.     GCS: GCS eye subscore is 4. GCS verbal subscore is 5. GCS motor subscore is 6.     Sensory: Sensation is intact. No sensory deficit.     Motor: Motor function is intact. No weakness.     Coordination: Coordination is intact. Coordination normal.     Gait: Gait is intact. Gait normal.  Psychiatric:        Attention and Perception: Attention and perception normal.        Mood and Affect: Mood and affect normal.        Speech: Speech normal.        Behavior: Behavior normal. Behavior is cooperative.        Thought Content: Thought content normal.        Cognition and Memory: Cognition and memory normal.        Judgment: Judgment normal.    Last depression screening scores    05/03/2023    2:46 PM 01/25/2023    3:15 PM 06/06/2022    4:16 PM  PHQ 2/9 Scores  PHQ - 2 Score 0 1 1  PHQ- 9 Score 4 4 4    Last fall risk screening    05/03/2023    2:46 PM  Fall Risk   Falls in the past year? 0  Number falls in past yr: 0  Injury with Fall? 0   Last Audit-C alcohol use screening    05/03/2023    2:46 PM  Alcohol Use Disorder Test (AUDIT)  1. How often do you have a drink containing alcohol? 2  2. How many drinks containing alcohol do you have on a typical day when  you are drinking? 0  3. How often do you have six or more drinks on one occasion? 0  AUDIT-C Score 2   A score of 3 or more in women, and 4 or more in men indicates increased risk for alcohol abuse, EXCEPT if all of the points are from question 1   Results for orders placed or performed in visit on 05/03/23  POCT Urinalysis Dipstick  Result Value Ref Range   Color, UA YELLOW    Clarity, UA CLEAR    Glucose, UA Negative Negative   Bilirubin, UA NEG    Ketones, UA 5    Spec Grav, UA >=1.030 (A) 1.010 - 1.025   Blood, UA NEG    pH, UA 5.0 5.0 - 8.0   Protein, UA Negative Negative   Urobilinogen, UA 0.2 0.2 or 1.0 E.U./dL   Nitrite, UA NEG    Leukocytes, UA Negative Negative   Appearance     Odor      Assessment & Plan    Routine Health Maintenance and Physical Exam  Exercise Activities and Dietary recommendations  Goals       Unable to afford Trulicity (pt-stated)      Current Barriers:  Engineer, maintenance Clinical Goal(s): Over the next 14 days, Ms.Mila Palmer will  work with [facility] pharmacist to develop a plan to obtain her Trulicity pens.   Interventions: [facility] pharmacist will provide Truicity coupon [facility] pharmacist to contact community pharmacy to investigate high deductible insurance plan or any tier alternatives (Ozempic, Victoza)  Patient Self Care Activities:  Activate Trulicity coupon  Initial goal documentation         Immunization History  Administered Date(s) Administered   Hep A / Hep B 08/01/2022, 08/29/2022, 02/04/2023  Influenza Split 09/28/2010, 01/16/2013   Influenza,inj,Quad PF,6+ Mos 12/18/2013, 09/03/2014, 01/15/2017, 12/24/2017, 10/03/2018   Janssen (J&J) SARS-COV-2 Vaccination 03/29/2021   Pneumococcal Polysaccharide-23 01/16/2013   Tdap 01/16/2013, 11/07/2022    Health Maintenance  Topic Date Due   Zoster Vaccines- Shingrix (1 of 2) Never done   PAP SMEAR-Modifier  Never done   OPHTHALMOLOGY EXAM  01/08/2019    COVID-19 Vaccine (2 - Janssen risk series) 04/26/2021   Diabetic kidney evaluation - Urine ACR  03/14/2023   INFLUENZA VACCINE  07/18/2023   HEMOGLOBIN A1C  07/26/2023   Diabetic kidney evaluation - eGFR measurement  01/24/2024   FOOT EXAM  01/26/2024   MAMMOGRAM  11/22/2024   COLONOSCOPY (Pts 45-47yrs Insurance coverage will need to be confirmed)  07/26/2032   DTaP/Tdap/Td (3 - Td or Tdap) 11/07/2032   Hepatitis C Screening  Completed   HIV Screening  Completed   HPV VACCINES  Aged Out    Discussed health benefits of physical activity, and encouraged her to engage in regular exercise appropriate for her age and condition.  Problem List Items Addressed This Visit       Cardiovascular and Mediastinum   RESOLVED: Acute on chronic combined systolic and diastolic CHF (congestive heart failure) (HCC)   Relevant Medications   amLODipine (NORVASC) 5 MG tablet   carvedilol (COREG) 25 MG tablet   lisinopril (ZESTRIL) 40 MG tablet   Aortic valve stenosis    Chronic, unknown Due for cards f/u; encouraged to schedule       Relevant Medications   amLODipine (NORVASC) 5 MG tablet   carvedilol (COREG) 25 MG tablet   lisinopril (ZESTRIL) 40 MG tablet   Other Relevant Orders   CBC   Comprehensive Metabolic Panel (CMET)   Hemoglobin A1c   Lipid panel   Iron, TIBC and Ferritin Panel   Magnesium   Hypertension associated with diabetes (HCC)    Chronic, borderline Goal <129/<79 On lisinopril 40 mg, hctz 25, coreg 25 bid, norvasc 5 mg      Relevant Medications   amLODipine (NORVASC) 5 MG tablet   carvedilol (COREG) 25 MG tablet   glipiZIDE (GLUCOTROL) 5 MG tablet   lisinopril (ZESTRIL) 40 MG tablet   Other Relevant Orders   CBC   Comprehensive Metabolic Panel (CMET)   SVT (supraventricular tachycardia)    Chronic, stable No complaints at this time Due for f/u with cardiology on BB Coreg 35 bid not on anti-arrhythmia medication at this time       Relevant Medications    amLODipine (NORVASC) 5 MG tablet   carvedilol (COREG) 25 MG tablet   lisinopril (ZESTRIL) 40 MG tablet     Digestive   Non-alcoholic cirrhosis (HCC)    Chronic; obesity noted recommend diet low in saturated fat and regular exercise - 30 min at least 5 times per week         Endocrine   Diabetic peripheral neuropathy associated with type 2 diabetes mellitus (HCC)    Chronic, stable Repeat labs Remains on glipizide 5 mg bid, metformin 1000 bid, januvia 100      Relevant Medications   glipiZIDE (GLUCOTROL) 5 MG tablet   lisinopril (ZESTRIL) 40 MG tablet   PARoxetine (PAXIL) 20 MG tablet   Other Relevant Orders   Hemoglobin A1c   Urine Microalbumin w/creat. ratio   Hyperlipidemia associated with type 2 diabetes mellitus (HCC)    Chronic, elevated Ldl goal of 55-70 Repeat lp Not on statin      Relevant  Medications   amLODipine (NORVASC) 5 MG tablet   carvedilol (COREG) 25 MG tablet   glipiZIDE (GLUCOTROL) 5 MG tablet   lisinopril (ZESTRIL) 40 MG tablet   Other Relevant Orders   Lipid panel   Hypothyroidism    Chronic, stable On levo 75 mcg Repeat tsh      Relevant Medications   carvedilol (COREG) 25 MG tablet   Type 2 diabetes mellitus with hyperglycemia, without long-term current use of insulin (HCC)    Chronic, borderline Last A1c at 8% Continue to recommend balanced, lower carb meals. Smaller meal size, adding snacks. Choosing water as drink of choice and increasing purposeful exercise. Repeat A1c and urine micro      Relevant Medications   glipiZIDE (GLUCOTROL) 5 MG tablet   lisinopril (ZESTRIL) 40 MG tablet   Other Relevant Orders   Hemoglobin A1c   Urine Microalbumin w/creat. ratio     Other   Annual physical exam - Primary    Due for vision Due for dental Things to do to keep yourself healthy  - Exercise at least 30-45 minutes a day, 3-4 days a week.  - Eat a low-fat diet with lots of fruits and vegetables, up to 7-9 servings per day.  -  Seatbelts can save your life. Wear them always.  - Smoke detectors on every level of your home, check batteries every year.  - Eye Doctor - have an eye exam every 1-2 years  - Safe sex - if you may be exposed to STDs, use a condom.  - Alcohol -  If you drink, do it moderately, less than 2 drinks per day.  - Health Care Power of Attorney. Choose someone to speak for you if you are not able.  - Depression is common in our stressful world.If you're feeling down or losing interest in things you normally enjoy, please come in for a visit.  - Violence - If anyone is threatening or hurting you, please call immediately.       Relevant Orders   CBC   Comprehensive Metabolic Panel (CMET)   Hemoglobin A1c   Lipid panel   Iron, TIBC and Ferritin Panel   TSH   Urine Microalbumin w/creat. ratio   Anxiety, generalized    Chronic, stable Remains on paxil at 20 mg       Relevant Medications   PARoxetine (PAXIL) 20 MG tablet   Avitaminosis D    Chronic, unknown  Previously on supplemental       Relevant Orders   Vitamin D (25 hydroxy)   Edema    Chronic, 2+ with extension to BLE On lasix 40 mg and hctz 25      Relevant Orders   CBC   Comprehensive Metabolic Panel (CMET)   Hemoglobin A1c   Iron, TIBC and Ferritin Panel   Urine Microalbumin w/creat. ratio   Urinary frequency   Relevant Orders   POCT Urinalysis Dipstick (Completed)   Return in about 3 months (around 08/03/2023) for chonic disease management.    Leilani Merl, [name], have reviewed all documentation for this visit. The documentation on 05/03/23 for the exam, diagnosis, procedures, and orders are all accurate and complete.  Jacky Kindle, [name]  [facility] [phone] (phone) [phone] (fax)  [facility]

## 2023-05-03 NOTE — Assessment & Plan Note (Addendum)
Chronic, stable No complaints at this time Due for f/u with cardiology on BB Coreg 35 bid not on anti-arrhythmia medication at this time

## 2023-05-03 NOTE — Assessment & Plan Note (Signed)
Chronic, stable Remains on paxil at 20 mg

## 2023-05-03 NOTE — Assessment & Plan Note (Signed)
Chronic; obesity noted recommend diet low in saturated fat and regular exercise - 30 min at least 5 times per week

## 2023-05-03 NOTE — Assessment & Plan Note (Addendum)
Chronic, borderline Goal <129/<79 On lisinopril 40 mg, hctz 25, coreg 25 bid, norvasc 5 mg

## 2023-05-03 NOTE — Assessment & Plan Note (Signed)
Chronic, unknown Due for cards f/u; encouraged to schedule

## 2023-05-03 NOTE — Assessment & Plan Note (Signed)
Chronic, stable On levo 75 mcg Repeat tsh

## 2023-05-03 NOTE — Assessment & Plan Note (Signed)
Due for vision Due for dental Things to do to keep yourself healthy  - Exercise at least 30-45 minutes a day, 3-4 days a week.  - Eat a low-fat diet with lots of fruits and vegetables, up to 7-9 servings per day.  - Seatbelts can save your life. Wear them always.  - Smoke detectors on every level of your home, check batteries every year.  - Eye Doctor - have an eye exam every 1-2 years  - Safe sex - if you may be exposed to STDs, use a condom.  - Alcohol -  If you drink, do it moderately, less than 2 drinks per day.  - Health Care Power of Attorney. Choose someone to speak for you if you are not able.  - Depression is common in our stressful world.If you're feeling down or losing interest in things you normally enjoy, please come in for a visit.  - Violence - If anyone is threatening or hurting you, please call immediately.  

## 2023-05-03 NOTE — Assessment & Plan Note (Signed)
Chronic, borderline Last A1c at 8% Continue to recommend balanced, lower carb meals. Smaller meal size, adding snacks. Choosing water as drink of choice and increasing purposeful exercise. Repeat A1c and urine micro

## 2023-05-03 NOTE — Assessment & Plan Note (Signed)
Chronic, unknown  Previously on supplemental

## 2023-05-03 NOTE — Assessment & Plan Note (Signed)
Chronic, elevated Ldl goal of 55-70 Repeat lp Not on statin

## 2023-05-03 NOTE — Assessment & Plan Note (Signed)
Chronic, 2+ with extension to BLE On lasix 40 mg and hctz 25

## 2023-05-03 NOTE — Assessment & Plan Note (Signed)
Chronic, stable Repeat labs Remains on glipizide 5 mg bid, metformin 1000 bid, januvia 100

## 2023-05-04 ENCOUNTER — Encounter: Payer: Self-pay | Admitting: Oncology

## 2023-05-04 ENCOUNTER — Other Ambulatory Visit: Payer: Self-pay | Admitting: Family Medicine

## 2023-05-04 LAB — COMPREHENSIVE METABOLIC PANEL
ALT: 27 IU/L (ref 0–32)
AST: 24 IU/L (ref 0–40)
Albumin/Globulin Ratio: 2.1 (ref 1.2–2.2)
Albumin: 4.5 g/dL (ref 3.9–4.9)
Alkaline Phosphatase: 65 IU/L (ref 44–121)
BUN/Creatinine Ratio: 24 (ref 12–28)
BUN: 20 mg/dL (ref 8–27)
Bilirubin Total: <0.2 mg/dL (ref 0.0–1.2)
CO2: 25 mmol/L (ref 20–29)
Calcium: 9.2 mg/dL (ref 8.7–10.3)
Chloride: 101 mmol/L (ref 96–106)
Creatinine, Ser: 0.84 mg/dL (ref 0.57–1.00)
Globulin, Total: 2.1 g/dL (ref 1.5–4.5)
Glucose: 162 mg/dL — ABNORMAL HIGH (ref 70–99)
Potassium: 3.9 mmol/L (ref 3.5–5.2)
Sodium: 143 mmol/L (ref 134–144)
Total Protein: 6.6 g/dL (ref 6.0–8.5)
eGFR: 78 mL/min/{1.73_m2} (ref >59)

## 2023-05-04 LAB — IRON,TIBC AND FERRITIN PANEL
Ferritin: 55 ng/mL (ref 15–150)
Iron Saturation: 11 % — ABNORMAL LOW (ref 15–55)
Iron: 33 ug/dL (ref 27–139)
Total Iron Binding Capacity: 309 ug/dL (ref 250–450)
UIBC: 276 ug/dL (ref 118–369)

## 2023-05-04 LAB — CBC
Hematocrit: 34.8 % (ref 34.0–46.6)
Hemoglobin: 10.7 g/dL — ABNORMAL LOW (ref 11.1–15.9)
MCH: 24.2 pg — ABNORMAL LOW (ref 26.6–33.0)
MCHC: 30.7 g/dL — ABNORMAL LOW (ref 31.5–35.7)
MCV: 79 fL (ref 79–97)
Platelets: 108 10*3/uL — ABNORMAL LOW (ref 150–450)
RBC: 4.43 x10E6/uL (ref 3.77–5.28)
RDW: 15.1 % (ref 11.7–15.4)
WBC: 5 10*3/uL (ref 3.4–10.8)

## 2023-05-04 LAB — MICROALBUMIN / CREATININE URINE RATIO
Creatinine, Urine: 91.6 mg/dL
Microalb/Creat Ratio: 34 mg/g creat — ABNORMAL HIGH (ref 0–29)
Microalbumin, Urine: 30.7 ug/mL

## 2023-05-04 LAB — LIPID PANEL
Chol/HDL Ratio: 4.5 ratio — ABNORMAL HIGH (ref 0.0–4.4)
Cholesterol, Total: 161 mg/dL (ref 100–199)
HDL: 36 mg/dL — ABNORMAL LOW (ref >39)
LDL Chol Calc (NIH): 86 mg/dL (ref 0–99)
Triglycerides: 233 mg/dL — ABNORMAL HIGH (ref 0–149)
VLDL Cholesterol Cal: 39 mg/dL (ref 5–40)

## 2023-05-04 LAB — HEMOGLOBIN A1C
Est. average glucose Bld gHb Est-mCnc: 220 mg/dL
Hgb A1c MFr Bld: 9.3 % — ABNORMAL HIGH (ref 4.8–5.6)

## 2023-05-04 LAB — MAGNESIUM: Magnesium: 1.7 mg/dL (ref 1.6–2.3)

## 2023-05-04 LAB — VITAMIN D 25 HYDROXY (VIT D DEFICIENCY, FRACTURES): Vit D, 25-Hydroxy: 69.4 ng/mL (ref 30.0–100.0)

## 2023-05-04 LAB — TSH: TSH: 4.32 u[IU]/mL (ref 0.450–4.500)

## 2023-05-04 MED ORDER — INSULIN DEGLUDEC 100 UNIT/ML ~~LOC~~ SOPN: 20.0000 [IU] | PEN_INJECTOR | Freq: Every day | SUBCUTANEOUS | 0 refills | Status: DC

## 2023-05-04 MED ORDER — "PEN NEEDLES 5/16"" 30G X 8 MM MISC": 1.0000 | Freq: Every day | 3 refills | Status: DC

## 2023-05-04 NOTE — Progress Notes (Signed)
Improved anemia; however, remains. A1c has increased and is no longer well controlled. Recommend start of once nighly insulin to assist. Continue to recommend balanced, lower carb meals. Smaller meal size, adding snacks. Choosing water as drink of choice and increasing purposeful exercise. Fats are elevated on lipid panel.The 10-year ASCVD risk score (Arnett DK, et al., 2019) is: 13.8% Iron sat remains low and urine micro is elevated. Recommend repeat in 6 months. Recommend  month f/u for A1c. Other labs are normal/stable.  Jacky Kindle, [name]  [facility] [address] [place], [place] [address] [phone] (phone) [phone] (fax) [facility]

## 2023-05-14 ENCOUNTER — Other Ambulatory Visit: Payer: Self-pay | Admitting: Family Medicine

## 2023-05-15 ENCOUNTER — Inpatient Hospital Stay: Payer: 59 | Attending: Oncology

## 2023-05-15 DIAGNOSIS — C50812 Malignant neoplasm of overlapping sites of left female breast: Secondary | ICD-10-CM | POA: Insufficient documentation

## 2023-05-15 DIAGNOSIS — Z170 Estrogen receptor positive status [ER+]: Secondary | ICD-10-CM | POA: Diagnosis not present

## 2023-05-15 DIAGNOSIS — Z95828 Presence of other vascular implants and grafts: Secondary | ICD-10-CM

## 2023-05-15 LAB — COMPREHENSIVE METABOLIC PANEL
ALT: 29 U/L (ref 0–44)
AST: 31 U/L (ref 15–41)
Albumin: 4.2 g/dL (ref 3.5–5.0)
Alkaline Phosphatase: 49 U/L (ref 38–126)
Anion gap: 10 (ref 5–15)
BUN: 18 mg/dL (ref 8–23)
CO2: 27 mmol/L (ref 22–32)
Calcium: 8.7 mg/dL — ABNORMAL LOW (ref 8.9–10.3)
Chloride: 100 mmol/L (ref 98–111)
Creatinine, Ser: 0.75 mg/dL (ref 0.44–1.00)
GFR, Estimated: >60 mL/min (ref >60)
Glucose, Bld: 148 mg/dL — ABNORMAL HIGH (ref 70–99)
Potassium: 3.8 mmol/L (ref 3.5–5.1)
Sodium: 137 mmol/L (ref 135–145)
Total Bilirubin: 0.2 mg/dL — ABNORMAL LOW (ref 0.3–1.2)
Total Protein: 7.2 g/dL (ref 6.5–8.1)

## 2023-05-15 LAB — CBC WITH DIFFERENTIAL/PLATELET
Abs Immature Granulocytes: 0.03 10*3/uL (ref 0.00–0.07)
Basophils Absolute: 0 10*3/uL (ref 0.0–0.1)
Basophils Relative: 0 %
Eosinophils Absolute: 0.1 10*3/uL (ref 0.0–0.5)
Eosinophils Relative: 2 %
HCT: 34.6 % — ABNORMAL LOW (ref 36.0–46.0)
Hemoglobin: 10.8 g/dL — ABNORMAL LOW (ref 12.0–15.0)
Immature Granulocytes: 1 %
Lymphocytes Relative: 32 %
Lymphs Abs: 1.9 10*3/uL (ref 0.7–4.0)
MCH: 24.4 pg — ABNORMAL LOW (ref 26.0–34.0)
MCHC: 31.2 g/dL (ref 30.0–36.0)
MCV: 78.1 fL — ABNORMAL LOW (ref 80.0–100.0)
Monocytes Absolute: 0.3 10*3/uL (ref 0.1–1.0)
Monocytes Relative: 5 %
Neutro Abs: 3.6 10*3/uL (ref 1.7–7.7)
Neutrophils Relative %: 60 %
Platelets: 114 10*3/uL — ABNORMAL LOW (ref 150–400)
RBC: 4.43 MIL/uL (ref 3.87–5.11)
RDW: 15.7 % — ABNORMAL HIGH (ref 11.5–15.5)
WBC: 5.9 10*3/uL (ref 4.0–10.5)
nRBC: 0 % (ref 0.0–0.2)

## 2023-05-15 MED ORDER — HEPARIN SOD (PORK) LOCK FLUSH 100 UNIT/ML IV SOLN
500.0000 [IU] | Freq: Once | INTRAVENOUS | Status: AC
  Administered 2023-05-15: 500 [IU] via INTRAVENOUS
  Filled 2023-05-15: qty 5

## 2023-05-15 MED ORDER — SODIUM CHLORIDE 0.9% FLUSH
10.0000 mL | Freq: Once | INTRAVENOUS | Status: AC
  Administered 2023-05-15: 10 mL via INTRAVENOUS
  Filled 2023-05-15: qty 10

## 2023-05-15 NOTE — Telephone Encounter (Signed)
Requested Prescriptions  Pending Prescriptions Disp Refills   hydrochlorothiazide (HYDRODIURIL) 25 MG tablet [Pharmacy Med Name: hydroCHLOROthiazide 25 MG Oral Tablet] 90 tablet 0    Sig: Take 1 tablet by mouth once daily     Cardiovascular: Diuretics - Thiazide Passed - 05/14/2023  6:50 AM      Passed - Cr in normal range and within 180 days    Creatinine, Ser  Date Value Ref Range Status  05/03/2023 0.84 0.57 - 1.00 mg/dL Final   Creatinine, POC  Date Value Ref Range Status  02/24/2016 NA mg/dL Final         Passed - K in normal range and within 180 days    Potassium  Date Value Ref Range Status  05/03/2023 3.9 3.5 - 5.2 mmol/L Final         Passed - Na in normal range and within 180 days    Sodium  Date Value Ref Range Status  05/03/2023 143 134 - 144 mmol/L Final         Passed - Last BP in normal range    BP Readings from Last 1 Encounters:  05/03/23 136/60         Passed - Valid encounter within last 6 months    Recent Outpatient Visits           1 week ago Annual physical exam   [facility] Merita Norton T, [name]   3 months ago Type 2 diabetes mellitus with hyperglycemia, without long-term current use of insulin [facility])   [place] [facility] Merita Norton T, [name]   11 months ago Type 2 diabetes mellitus with hyperglycemia, without long-term current use of insulin [facility])   [place] [facility] Merita Norton T, [name]   1 year ago Type 2 diabetes mellitus with hyperglycemia, without long-term current use of insulin [facility])   [place] [facility] Merita Norton T, [name]   1 year ago Type 2 diabetes mellitus without complication, without long-term current use of insulin [facility])   [facility] Jacky Kindle, [place]

## 2023-05-30 ENCOUNTER — Encounter: Payer: Self-pay | Admitting: Oncology

## 2023-05-30 DIAGNOSIS — Z1231 Encounter for screening mammogram for malignant neoplasm of breast: Secondary | ICD-10-CM

## 2023-06-04 ENCOUNTER — Other Ambulatory Visit: Payer: Self-pay

## 2023-06-13 ENCOUNTER — Other Ambulatory Visit: Payer: Self-pay

## 2023-06-13 DIAGNOSIS — Z170 Estrogen receptor positive status [ER+]: Secondary | ICD-10-CM

## 2023-06-14 ENCOUNTER — Inpatient Hospital Stay (HOSPITAL_BASED_OUTPATIENT_CLINIC_OR_DEPARTMENT_OTHER): Payer: 59 | Admitting: Nurse Practitioner

## 2023-06-14 ENCOUNTER — Encounter: Payer: Self-pay | Admitting: Nurse Practitioner

## 2023-06-14 ENCOUNTER — Other Ambulatory Visit: Payer: Self-pay

## 2023-06-14 ENCOUNTER — Inpatient Hospital Stay: Payer: 59 | Attending: Oncology

## 2023-06-14 VITALS — BP 135/84 | HR 61 | Temp 98.6°F | Wt 186.0 lb

## 2023-06-14 DIAGNOSIS — Z95828 Presence of other vascular implants and grafts: Secondary | ICD-10-CM

## 2023-06-14 DIAGNOSIS — Z79811 Long term (current) use of aromatase inhibitors: Secondary | ICD-10-CM | POA: Insufficient documentation

## 2023-06-14 DIAGNOSIS — D696 Thrombocytopenia, unspecified: Secondary | ICD-10-CM | POA: Insufficient documentation

## 2023-06-14 DIAGNOSIS — Z170 Estrogen receptor positive status [ER+]: Secondary | ICD-10-CM

## 2023-06-14 DIAGNOSIS — C50812 Malignant neoplasm of overlapping sites of left female breast: Secondary | ICD-10-CM | POA: Diagnosis not present

## 2023-06-14 DIAGNOSIS — I350 Nonrheumatic aortic (valve) stenosis: Secondary | ICD-10-CM | POA: Diagnosis not present

## 2023-06-14 DIAGNOSIS — Z818 Family history of other mental and behavioral disorders: Secondary | ICD-10-CM | POA: Diagnosis not present

## 2023-06-14 DIAGNOSIS — Z79899 Other long term (current) drug therapy: Secondary | ICD-10-CM | POA: Diagnosis not present

## 2023-06-14 DIAGNOSIS — Z9049 Acquired absence of other specified parts of digestive tract: Secondary | ICD-10-CM | POA: Insufficient documentation

## 2023-06-14 DIAGNOSIS — E1165 Type 2 diabetes mellitus with hyperglycemia: Secondary | ICD-10-CM | POA: Diagnosis not present

## 2023-06-14 DIAGNOSIS — Z8042 Family history of malignant neoplasm of prostate: Secondary | ICD-10-CM | POA: Diagnosis not present

## 2023-06-14 DIAGNOSIS — Z90710 Acquired absence of both cervix and uterus: Secondary | ICD-10-CM | POA: Diagnosis not present

## 2023-06-14 DIAGNOSIS — N644 Mastodynia: Secondary | ICD-10-CM

## 2023-06-14 DIAGNOSIS — Z833 Family history of diabetes mellitus: Secondary | ICD-10-CM | POA: Diagnosis not present

## 2023-06-14 DIAGNOSIS — Z87891 Personal history of nicotine dependence: Secondary | ICD-10-CM | POA: Diagnosis not present

## 2023-06-14 DIAGNOSIS — Z8349 Family history of other endocrine, nutritional and metabolic diseases: Secondary | ICD-10-CM | POA: Diagnosis not present

## 2023-06-14 DIAGNOSIS — K7581 Nonalcoholic steatohepatitis (NASH): Secondary | ICD-10-CM | POA: Insufficient documentation

## 2023-06-14 DIAGNOSIS — I10 Essential (primary) hypertension: Secondary | ICD-10-CM | POA: Diagnosis not present

## 2023-06-14 DIAGNOSIS — C50912 Malignant neoplasm of unspecified site of left female breast: Secondary | ICD-10-CM | POA: Insufficient documentation

## 2023-06-14 DIAGNOSIS — Z803 Family history of malignant neoplasm of breast: Secondary | ICD-10-CM | POA: Diagnosis not present

## 2023-06-14 DIAGNOSIS — Z8249 Family history of ischemic heart disease and other diseases of the circulatory system: Secondary | ICD-10-CM | POA: Diagnosis not present

## 2023-06-14 DIAGNOSIS — D649 Anemia, unspecified: Secondary | ICD-10-CM | POA: Insufficient documentation

## 2023-06-14 LAB — CBC WITH DIFFERENTIAL (CANCER CENTER ONLY)
Abs Immature Granulocytes: 0.02 10*3/uL (ref 0.00–0.07)
Basophils Absolute: 0 10*3/uL (ref 0.0–0.1)
Basophils Relative: 1 %
Eosinophils Absolute: 0.1 10*3/uL (ref 0.0–0.5)
Eosinophils Relative: 2 %
HCT: 35.1 % — ABNORMAL LOW (ref 36.0–46.0)
Hemoglobin: 10.9 g/dL — ABNORMAL LOW (ref 12.0–15.0)
Immature Granulocytes: 0 %
Lymphocytes Relative: 29 %
Lymphs Abs: 1.4 10*3/uL (ref 0.7–4.0)
MCH: 24.7 pg — ABNORMAL LOW (ref 26.0–34.0)
MCHC: 31.1 g/dL (ref 30.0–36.0)
MCV: 79.6 fL — ABNORMAL LOW (ref 80.0–100.0)
Monocytes Absolute: 0.2 10*3/uL (ref 0.1–1.0)
Monocytes Relative: 5 %
Neutro Abs: 3 10*3/uL (ref 1.7–7.7)
Neutrophils Relative %: 63 %
Platelet Count: 96 10*3/uL — ABNORMAL LOW (ref 150–400)
RBC: 4.41 MIL/uL (ref 3.87–5.11)
RDW: 15.8 % — ABNORMAL HIGH (ref 11.5–15.5)
WBC Count: 4.7 10*3/uL (ref 4.0–10.5)
nRBC: 0 % (ref 0.0–0.2)

## 2023-06-14 LAB — CMP (CANCER CENTER ONLY)
ALT: 28 U/L (ref 0–44)
AST: 25 U/L (ref 15–41)
Albumin: 4.3 g/dL (ref 3.5–5.0)
Alkaline Phosphatase: 47 U/L (ref 38–126)
Anion gap: 11 (ref 5–15)
BUN: 24 mg/dL — ABNORMAL HIGH (ref 8–23)
CO2: 26 mmol/L (ref 22–32)
Calcium: 8.9 mg/dL (ref 8.9–10.3)
Chloride: 100 mmol/L (ref 98–111)
Creatinine: 0.83 mg/dL (ref 0.44–1.00)
GFR, Estimated: >60 mL/min (ref >60)
Glucose, Bld: 237 mg/dL — ABNORMAL HIGH (ref 70–99)
Potassium: 4.1 mmol/L (ref 3.5–5.1)
Sodium: 137 mmol/L (ref 135–145)
Total Bilirubin: 0.5 mg/dL (ref 0.3–1.2)
Total Protein: 7.1 g/dL (ref 6.5–8.1)

## 2023-06-14 LAB — MAGNESIUM: Magnesium: 1.5 mg/dL — ABNORMAL LOW (ref 1.7–2.4)

## 2023-06-14 MED ORDER — SODIUM CHLORIDE 0.9% FLUSH
10.0000 mL | Freq: Once | INTRAVENOUS | Status: AC
  Administered 2023-06-14: 10 mL via INTRAVENOUS
  Filled 2023-06-14: qty 10

## 2023-06-14 MED ORDER — HEPARIN SOD (PORK) LOCK FLUSH 100 UNIT/ML IV SOLN
500.0000 [IU] | Freq: Once | INTRAVENOUS | Status: AC
  Administered 2023-06-14: 500 [IU] via INTRAVENOUS
  Filled 2023-06-14: qty 5

## 2023-06-14 NOTE — Progress Notes (Signed)
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    INTERVAL HISTORY: Patient returns to clinic sooner than scheduled for complaints of right breast pain. Pain appeared increased over past 2 months and she noticed a skin lesion x 1 month. She denies pulled muscle, exertion. No fevers, chills, unintentional weight loss. She otherwise feels well and denies other complaints. Reports compliance with letrozole.   REVIEW OF SYSTEMS:   Review of Systems  Constitutional: Negative.  Negative for fever, malaise/fatigue and weight loss.  Respiratory: Negative.  Negative for cough and shortness of breath.   Cardiovascular:  Positive for chest pain. Negative for leg swelling.  Gastrointestinal: Negative.  Negative for abdominal pain, blood in stool, constipation and diarrhea.  Genitourinary: Negative.  Negative for dysuria.  Musculoskeletal: Negative.  Negative for back pain, falls and joint pain.  Skin: Negative.  Negative for rash.  Neurological: Negative.  Negative for dizziness, focal weakness, weakness and headaches.  Psychiatric/Behavioral: Negative.  Negative for depression. The patient is not nervous/anxious.   As per HPI. Otherwise, a complete review of systems is negative.  PAST MEDICAL HISTORY: Past Medical History:  Diagnosis Date   Anxiety    Aortic stenosis    Cirrhosis (HCC)    Diabetes mellitus    Edema    Heart murmur    Hypertension    Sleep apnea    Thyroid disease     PAST SURGICAL HISTORY: Past Surgical History:   Procedure Laterality Date   ABDOMINAL HYSTERECTOMY  11/17/2013   BREAST BIOPSY     x2   BREAST BIOPSY Left 07/20/2021   [place] Bx, Ribbon Clip, [facility]   BREAST BIOPSY Left 07/20/2021   Stereo Bx, X-clip, DCIS   BREAST BIOPSY Left 07/20/2021   Stereo biopsy, coil clip, DCIS   BREAST BIOPSY Right 07/20/2021   Stereo Bx, Ribbon Clip, Fat necrosis   CESAREAN SECTION     x 2   CHOLECYSTECTOMY  12/17/2001   DUE TO STONES   COLONOSCOPY WITH PROPOFOL N/A 07/26/2022   Procedure: COLONOSCOPY WITH PROPOFOL;  Surgeon: Toney Reil, MD;  Location: [facility] ENDOSCOPY;  Service: Gastroenterology;  Laterality: N/A;   DILATION AND CURETTAGE OF UTERUS     ELBOW SURGERY     ESOPHAGOGASTRODUODENOSCOPY (EGD) WITH PROPOFOL N/A 07/26/2022   Procedure: ESOPHAGOGASTRODUODENOSCOPY (EGD) WITH PROPOFOL;  Surgeon: Toney Reil, MD;  Location: [facility] ENDOSCOPY;  Service: Gastroenterology;  Laterality: N/A;   POLYPECTOMY  12/17/1993   PORTACATH PLACEMENT Right 10/25/2021   Procedure: INSERTION PORT-A-CATH;  Surgeon: Campbell Lerner, MD;  Location: [facility] ORS;  Service: General;  Laterality: Right;   SIMPLE MASTECTOMY WITH AXILLARY SENTINEL NODE BIOPSY Left 08/16/2021   Procedure: SIMPLE MASTECTOMY WITH AXILLARY SENTINEL NODE BIOPSY;  Surgeon: Campbell Lerner, MD;  Location: [facility] ORS;  Service: General;  Laterality: Left;    FAMILY HISTORY: Family History  Problem Relation Age of Onset   Hyperlipidemia Mother    Thyroid disease Mother    Hashimoto's thyroiditis Mother    Heart disease Father        open heart surgery   Heart failure Father  Diabetes Father    Dementia Father    Thyroid disease Brother    Breast cancer Maternal Grandmother    Diabetes Paternal Grandmother    Prostate cancer Paternal Grandfather     ADVANCED DIRECTIVES (Y/N):  N  HEALTH MAINTENANCE: Social History   Tobacco Use   Smoking status: Former    Packs/day: 1.50    Years: 25.00    Additional pack years: 0.00     Total pack years: 37.50    Types: Cigarettes    Quit date: 11/24/2001    Years since quitting: 21.5   Smokeless tobacco: Never  Vaping Use   Vaping Use: Never used  Substance Use Topics   Alcohol use: Yes    Alcohol/week: 0.0 standard drinks of alcohol    Comment: OCCASIONALLY DRINKS WINE, ONCE A WEEK   Drug use: No     Colonoscopy:  PAP:  Bone density:  Lipid panel:  Allergies  Allergen Reactions   Fosaprepitant Nausea Only and Other (See Comments)    Back pain, nausea. See progress note from 10/27/2021     Current Outpatient Medications  Medication Sig Dispense Refill   amLODipine (NORVASC) 5 MG tablet Take 1 tablet (5 mg total) by mouth daily. 90 tablet 1   Ascorbic Acid (VITAMIN C PO) Take 1 tablet by mouth daily.     aspirin 81 MG EC tablet Take 81 mg by mouth at bedtime.     carvedilol (COREG) 25 MG tablet Take 1 tablet (25 mg total) by mouth 2 (two) times daily. 180 tablet 1   Cholecalciferol (VITAMIN D3) 1000 UNITS CAPS Take 1,000 Units by mouth at bedtime.     diphenoxylate-atropine (LOMOTIL) 2.5-0.025 MG tablet Take 2 tablets by mouth 4 (four) times daily as needed for diarrhea or loose stools. 60 tablet 1   furosemide (LASIX) 40 MG tablet TAKE 1 TABLET BY MOUTH ONCE DAILY AS NEEDED FOR FLUID OR EDEMA 90 tablet 1   glipiZIDE (GLUCOTROL) 5 MG tablet Take 1 tablet (5 mg total) by mouth 2 (two) times daily before a meal. 180 tablet 1   glucose blood (CONTOUR NEXT TEST) test strip Test fasting sugar each morning. Recheck if having hypoglycemic symptoms. 100 each 4   hydrochlorothiazide (HYDRODIURIL) 25 MG tablet Take 1 tablet by mouth once daily 90 tablet 0   hydrocortisone (ANUSOL-HC) 2.5 % rectal cream Place 1 Application rectally 2 (two) times daily. 30 g 0   insulin degludec (TRESIBA) 100 UNIT/ML FlexTouch Pen Inject 20 Units into the skin at bedtime. 9 mL 0   letrozole (FEMARA) 2.5 MG tablet Take 1 tablet (2.5 mg total) by mouth daily. 90 tablet 3   levothyroxine  (SYNTHROID) 75 MCG tablet TAKE 1 TABLET BY MOUTH ONCE DAILY BEFORE BREAKFAST 90 tablet 0   lisinopril (ZESTRIL) 40 MG tablet Take 1 tablet (40 mg total) by mouth daily. 90 tablet 1   metFORMIN (GLUCOPHAGE) 1000 MG tablet Take 1 tablet (1,000 mg total) by mouth 2 (two) times daily with a meal. 180 tablet 3   Multiple Vitamin (MULTIVITAMIN) tablet Take 1 tablet by mouth every morning.     mupirocin ointment (BACTROBAN) 2 % Apply 1 Application topically daily. 22 g 0   omeprazole (PRILOSEC) 40 MG capsule TAKE 1 CAPSULE BY MOUTH ONCE DAILY BEFORE BREAKFAST 90 capsule 1   PARoxetine (PAXIL) 20 MG tablet Take 1 tablet (20 mg total) by mouth daily. 90 tablet 1   sitaGLIPtin (JANUVIA) 100 MG tablet Take 1 tablet (100 mg  total) by mouth daily. 90 tablet 3   traMADol (ULTRAM) 50 MG tablet Take 1 tablet (50 mg total) by mouth every 6 (six) hours as needed (pain). 12 tablet 0   triamcinolone ointment (KENALOG) 0.5 % Apply 1 Application topically 2 (two) times daily. 30 g 0   zinc gluconate 50 MG tablet Take 50 mg by mouth daily.     Insulin Pen Needle (PEN NEEDLES 5/16") 30G X 8 MM MISC 1 each by Does not apply route daily. 100 each 3   No current facility-administered medications for this visit.    OBJECTIVE: Vitals:   06/14/23 1026  BP: 135/84  Pulse: 61  Temp: 98.6 F (37 C)  SpO2: 97%     Body mass index is 31.93 kg/m.    ECOG FS:0 - Asymptomatic  General: Well-developed, well-nourished, no acute distress. Eyes: Pink conjunctiva, anicteric sclera. HEENT: Normocephalic, moist mucous membranes. Lungs: No audible wheezing or coughing. Heart: Regular rate and rhythm. Abdomen: Soft, nontender, no obvious distention. Musculoskeletal: No edema, cyanosis, or clubbing. Neuro: Alert, answering all questions appropriately. Cranial nerves grossly intact. Skin: No rashes or petechiae noted. Psych: Normal affect. Breast: Breast exam was performed in seated and lying down position. Patient is status  post left mastectomy. No palpables masses on either side. She has tenderness to palpation diffusely of both sides but Right > Left. No evidence of axillary adenopathy. 2 flesh colored skin lesions on right breast skin. Nontender. No redness of breast or increased fullness. No discharge.    LAB RESULTS: Lab Results  Component Value Date   NA 137 06/14/2023   K 4.1 06/14/2023   CL 100 06/14/2023   CO2 26 06/14/2023   GLUCOSE 237 (H) 06/14/2023   BUN 24 (H) 06/14/2023   CREATININE 0.83 06/14/2023   CALCIUM 8.9 06/14/2023   PROT 7.1 06/14/2023   ALBUMIN 4.3 06/14/2023   AST 25 06/14/2023   ALT 28 06/14/2023   ALKPHOS 47 06/14/2023   BILITOT 0.5 06/14/2023   GFRNONAA >60 06/14/2023   GFRAA 111 10/14/2020    Lab Results  Component Value Date   WBC 4.7 06/14/2023   NEUTROABS 3.0 06/14/2023   HGB 10.9 (L) 06/14/2023   HCT 35.1 (L) 06/14/2023   MCV 79.6 (L) 06/14/2023   PLT 96 (L) 06/14/2023     STUDIES: No results found.  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 Patient only received 3 cycles of treatment, then carboplatinum and Taxotere were discontinued secondary to persistent thrombocytopenia.  She last received carboplatinum and Taxotere on December 22, 2021.  Patient was subsequently transitioned to maintenance Perjeta and Herceptin which she completed on October 25, 2022.  Continue letrozole completing 5 years of treatment in November 2028. She does not require left mammogram d/t mastectomy.  Right breast pain- Previously, right breast biopsy was negative for malignancy.   Her most recent mammogram on November 22, 2022 was reported as BI-RADS 1, repeat in December 2024. Given new pain, recommend moving up right  mammogram for evaluation. If negative recommend MRI chest to evaluate for possible chest wall mass.  Bone health: Bone density February 19, 2023 reported with t score of -0.1, considered normal. Continue calcium and vitamin d.  Diarrhea: Resolved.  Continue Lomotil as needed.   Leukopenia: Resolved. Thrombocytopenia: persistent. Follow up with Dr Orlie Dakin.  Anemia: Hemoglobin remains decreased but stable. Follow up with Dr Orlie Dakin.  Hyperglycemia: Chronic and unchanged.  Patient blood sugars remain persistently elevated. Encouraged dietary discretion. Follow up with PCP.  NASH: Continue follow-up with GI as scheduled. Port a cath- flushed today  Disposition:  Diagnostic right mammogram 3 mo- port flush 6 mo- port flush & see Dr Orlie Dakin for breast surveillance - la  Patient expressed understanding and was in agreement with this plan. She also understands that She can call clinic at any time with any questions, concerns, or complaints.    Cancer Staging  Carcinoma of left breast [facility]) Staging form: Breast, AJCC 8th Edition - Pathologic stage from 08/30/2021: Stage IB (pT2, pN1a, cM0, G3, ER+, PR+, HER2+, Oncotype DX score: 18) - Signed by Jeralyn Ruths, MD on 09/13/2021 Stage prefix: Initial diagnosis Multigene prognostic tests performed: Oncotype DX Recurrence score range: Greater than or equal to 11 Histologic grading system: 3 grade system  Alinda Dooms, NP   06/14/2023   CC: Dr. Orlie Dakin

## 2023-06-15 ENCOUNTER — Other Ambulatory Visit: Payer: Self-pay

## 2023-06-28 ENCOUNTER — Ambulatory Visit
Admission: RE | Admit: 2023-06-28 | Discharge: 2023-06-28 | Disposition: A | Discharge status: Home / Self Care | Payer: 59 | Source: Ambulatory Visit | Attending: Nurse Practitioner | Admitting: Nurse Practitioner

## 2023-06-28 DIAGNOSIS — N644 Mastodynia: Secondary | ICD-10-CM

## 2023-06-28 DIAGNOSIS — R928 Other abnormal and inconclusive findings on diagnostic imaging of breast: Secondary | ICD-10-CM | POA: Diagnosis not present

## 2023-07-13 ENCOUNTER — Other Ambulatory Visit: Payer: Self-pay | Admitting: Gastroenterology

## 2023-07-18 ENCOUNTER — Other Ambulatory Visit: Payer: Self-pay | Admitting: Family Medicine

## 2023-07-18 NOTE — Telephone Encounter (Signed)
Requested Prescriptions  Pending Prescriptions Disp Refills   levothyroxine (SYNTHROID) 75 MCG tablet [Pharmacy Med Name: Levothyroxine Sodium 75 MCG Oral Tablet] 90 tablet 0    Sig: TAKE 1 TABLET BY MOUTH ONCE DAILY BEFORE BREAKFAST     Endocrinology:  Hypothyroid Agents Passed - 07/18/2023  6:50 AM      Passed - TSH in normal range and within 360 days    TSH  Date Value Ref Range Status  05/03/2023 4.320 0.450 - 4.500 uIU/mL Final         Passed - Valid encounter within last 12 months    Recent Outpatient Visits           2 months ago Annual physical exam   [facility] Merita Norton T, [name]   5 months ago Type 2 diabetes mellitus with hyperglycemia, without long-term current use of insulin [facility])   [facility] Merita Norton T, [name]   1 year ago Type 2 diabetes mellitus with hyperglycemia, without long-term current use of insulin [facility])   [place] [facility] Merita Norton T, [name]   1 year ago Type 2 diabetes mellitus with hyperglycemia, without long-term current use of insulin [facility])    [facility] Merita Norton T, [name]   1 year ago Type 2 diabetes mellitus without complication, without long-term current use of insulin [facility]   [facility] Jacky Kindle, [place]

## 2023-07-19 ENCOUNTER — Telehealth: Payer: Self-pay | Admitting: *Deleted

## 2023-07-19 NOTE — Progress Notes (Signed)
  Care Coordination  Outreach Note  07/19/2023 Name: Emily Tapia MRN: [id number] DOB: 09-Jun-1961   Care Coordination Outreach Attempts: An unsuccessful telephone outreach was attempted today to offer the patient information about available care coordination services.  Follow Up Plan:  Additional outreach attempts will be made to offer the patient care coordination information and services.   Encounter Outcome:  No Answer  Burman Nieves, CCMA Care Coordination Care Guide Direct Dial: [phone]

## 2023-07-21 ENCOUNTER — Other Ambulatory Visit: Payer: Self-pay | Admitting: Gastroenterology

## 2023-07-23 ENCOUNTER — Other Ambulatory Visit: Payer: 59

## 2023-07-23 ENCOUNTER — Ambulatory Visit: Payer: 59 | Admitting: Nurse Practitioner

## 2023-07-23 ENCOUNTER — Ambulatory Visit: Payer: 59 | Admitting: Oncology

## 2023-08-06 ENCOUNTER — Other Ambulatory Visit: Payer: Self-pay | Admitting: Family Medicine

## 2023-08-23 NOTE — Progress Notes (Signed)
  Care Coordination  Outreach Note  08/23/2023 Name: Markeria Dilts MRN: [id number] DOB: 1961-08-27   Care Coordination Outreach Attempts: A third unsuccessful outreach was attempted today to offer the patient with information about available care coordination services.  Follow Up Plan:  No further outreach attempts will be made at this time. We have been unable to contact the patient to offer or enroll patient in care coordination services  Encounter Outcome:  No Answer  Burman Nieves, [facility] Care Coordination Care Guide Direct Dial: [phone]

## 2023-08-30 ENCOUNTER — Inpatient Hospital Stay: Payer: 59 | Attending: Oncology

## 2023-08-30 DIAGNOSIS — Z170 Estrogen receptor positive status [ER+]: Secondary | ICD-10-CM | POA: Diagnosis not present

## 2023-08-30 DIAGNOSIS — C50812 Malignant neoplasm of overlapping sites of left female breast: Secondary | ICD-10-CM | POA: Diagnosis present

## 2023-08-30 DIAGNOSIS — Z79899 Other long term (current) drug therapy: Secondary | ICD-10-CM | POA: Insufficient documentation

## 2023-08-30 MED ORDER — HEPARIN SOD (PORK) LOCK FLUSH 100 UNIT/ML IV SOLN
500.0000 [IU] | Freq: Once | INTRAVENOUS | Status: AC
  Administered 2023-08-30: 500 [IU] via INTRAVENOUS
  Filled 2023-08-30: qty 5

## 2023-08-30 MED ORDER — SODIUM CHLORIDE 0.9% FLUSH
10.0000 mL | Freq: Once | INTRAVENOUS | Status: AC
  Administered 2023-08-30: 10 mL via INTRAVENOUS
  Filled 2023-08-30: qty 10

## 2023-09-28 ENCOUNTER — Ambulatory Visit
Admission: EM | Admit: 2023-09-28 | Discharge: 2023-09-28 | Disposition: A | Discharge status: Home / Self Care | Payer: 59 | Attending: Emergency Medicine | Admitting: Emergency Medicine

## 2023-09-28 DIAGNOSIS — J069 Acute upper respiratory infection, unspecified: Secondary | ICD-10-CM

## 2023-09-28 MED ORDER — BENZONATATE 100 MG PO CAPS: 100.0000 mg | ORAL_CAPSULE | Freq: Three times a day (TID) | ORAL | 0 refills | Status: DC

## 2023-09-28 MED ORDER — AZITHROMYCIN 250 MG PO TABS: 250.0000 mg | ORAL_TABLET | Freq: Every day | ORAL | 0 refills | Status: DC

## 2023-09-28 MED ORDER — PREDNISONE 20 MG PO TABS: 40.0000 mg | ORAL_TABLET | Freq: Every day | ORAL | 0 refills | Status: DC

## 2023-09-28 NOTE — Discharge Instructions (Addendum)
Your symptoms today are most likely being caused by a virus and should steadily improve in time it can take up to 7 to 10 days before you truly start to see a turnaround however things will get better  Begin azithromycin to provide coverage for bacteria to keep symptoms from progressing to a more serious respiratory illness  Begin prednisone every morning with food to open and relax the airway, should settle persistent coughing, shortness of breath and wheezing  May use Tessalon pill every 8 hours as needed to help calm coughing, may take this in addition to DayQuil and NyQuil  You can take Tylenol and/or Ibuprofen as needed for fever reduction and pain relief.   For cough: honey 1/2 to 1 teaspoon (you can dilute the honey in water or another fluid).  You can also use guaifenesin and dextromethorphan for cough. You can use a humidifier for chest congestion and cough.  If you don't have a humidifier, you can sit in the bathroom with the hot shower running.      For sore throat: try warm salt water gargles, cepacol lozenges, throat spray, warm tea or water with lemon/honey, popsicles or ice, or OTC cold relief medicine for throat discomfort.   For congestion: take a daily anti-histamine like Zyrtec, Claritin, and a oral decongestant, such as pseudoephedrine.  You can also use Flonase 1-2 sprays in each nostril daily.   It is important to stay hydrated: drink plenty of fluids (water, gatorade/powerade/pedialyte, juices, or teas) to keep your throat moisturized and help further relieve irritation/discomfort.

## 2023-09-28 NOTE — ED Triage Notes (Signed)
Patient presents to UC for cough since Tuesday. Treating symptoms with dayquil and nyquil. Last known fever weds or thurs.

## 2023-09-28 NOTE — ED Provider Notes (Signed)
Renaldo Fiddler    CSN: [id number] Arrival date & time: 09/28/23  1055      History   Chief Complaint Chief Complaint  Patient presents with   Cough    HPI Emily Tapia is a 62 y.o. female.   Patient presents for evaluation of nasal congestion, rhinorrhea, intermittently productive cough, shortness of breath with exertion and wheezing present for 5 days.  Mild sore throat accompanying.  Experiencing low-grade fevers peaking at 100.3, resolved 3 to 4 days ago.  Has been able to tolerate food and liquids.  No known sick contacts.  Has attempted use of DayQuil and NyQuil which has been somewhat helpful.  Denies respiratory history.  Currently being treated for breast cancer.  Past Medical History:  Diagnosis Date   Anxiety    Aortic stenosis    Cirrhosis (HCC)    Diabetes mellitus    Edema    Heart murmur    Hypertension    Sleep apnea    Thyroid disease     Patient Active Problem List   Diagnosis Date Noted   Hyperlipidemia associated with type 2 diabetes mellitus (HCC) 05/03/2023   Annual physical exam 05/03/2023   Avitaminosis D 05/03/2023   SVT (supraventricular tachycardia) (HCC) 05/03/2023   Urinary frequency 05/03/2023   Diabetic peripheral neuropathy associated with type 2 diabetes mellitus (HCC) 01/25/2023   Non-alcoholic cirrhosis (HCC) 01/25/2023   Type 2 diabetes mellitus with hyperglycemia, without long-term current use of insulin (HCC) 03/13/2022   Hypertension associated with diabetes (HCC) 03/13/2022   Malignant neoplasm of upper-inner quadrant of left breast in female, estrogen receptor positive (HCC) 10/23/2021   Carcinoma of left breast (HCC) 08/30/2021   Hypothyroidism 04/27/2015   Aortic valve stenosis 05/01/2011   Edema 03/27/2011   Anxiety, generalized 07/05/2006    Past Surgical History:  Procedure Laterality Date   ABDOMINAL HYSTERECTOMY  11/17/2013   BREAST BIOPSY     x2   BREAST BIOPSY Left 07/20/2021   [place] Bx, Ribbon  Clip, [facility]   BREAST BIOPSY Left 07/20/2021   Stereo Bx, X-clip, [facility]   BREAST BIOPSY Left 07/20/2021   Stereo biopsy, coil clip, [facility]   BREAST BIOPSY Right 07/20/2021   Stereo Bx, Ribbon Clip, Fat necrosis   CESAREAN SECTION     x 2   CHOLECYSTECTOMY  12/17/2001   DUE TO STONES   COLONOSCOPY WITH PROPOFOL N/A 07/26/2022   Procedure: COLONOSCOPY WITH PROPOFOL;  Surgeon: Toney Reil, MD;  Location: [facility] ENDOSCOPY;  Service: Gastroenterology;  Laterality: N/A;   DILATION AND CURETTAGE OF UTERUS     ELBOW SURGERY     ESOPHAGOGASTRODUODENOSCOPY (EGD) WITH PROPOFOL N/A 07/26/2022   Procedure: ESOPHAGOGASTRODUODENOSCOPY (EGD) WITH PROPOFOL;  Surgeon: Toney Reil, MD;  Location: [facility] ENDOSCOPY;  Service: Gastroenterology;  Laterality: N/A;   POLYPECTOMY  12/17/1993   PORTACATH PLACEMENT Right 10/25/2021   Procedure: INSERTION PORT-A-CATH;  Surgeon: Campbell Lerner, MD;  Location: [facility] ORS;  Service: General;  Laterality: Right;   SIMPLE MASTECTOMY WITH AXILLARY SENTINEL NODE BIOPSY Left 08/16/2021   Procedure: SIMPLE MASTECTOMY WITH AXILLARY SENTINEL NODE BIOPSY;  Surgeon: Campbell Lerner, MD;  Location: [facility] ORS;  Service: General;  Laterality: Left;    OB History     Gravida  3   Para  2   Term      Preterm      AB      Living         SAB  IAB      Ectopic      Multiple      Live Births               Home Medications    Prior to Admission medications   Medication Sig Start Date End Date Taking? Authorizing Provider  azithromycin (ZITHROMAX) 250 MG tablet Take 1 tablet (250 mg total) by mouth daily. Take first 2 tablets together, then 1 every day until finished. 09/28/23  Yes Nevan Creighton R, NP  benzonatate (TESSALON) 100 MG capsule Take 1 capsule (100 mg total) by mouth every 8 (eight) hours. 09/28/23  Yes Xianna Siverling R, NP  predniSONE (DELTASONE) 20 MG tablet Take 2 tablets (40 mg total) by mouth daily. 09/28/23  Yes Charles Niese  R, NP  amLODipine (NORVASC) 5 MG tablet Take 1 tablet (5 mg total) by mouth daily. 05/03/23   Jacky Kindle, [name]  Ascorbic Acid (VITAMIN C PO) Take 1 tablet by mouth daily.    [provider]  aspirin 81 MG EC tablet Take 81 mg by mouth at bedtime.    [provider]  carvedilol (COREG) 25 MG tablet Take 1 tablet (25 mg total) by mouth 2 (two) times daily. 05/03/23   Jacky Kindle, [name]  Cholecalciferol (VITAMIN D3) 1000 UNITS CAPS Take 1,000 Units by mouth at bedtime.    [provider]  diphenoxylate-atropine (LOMOTIL) 2.5-0.025 MG tablet Take 2 tablets by mouth 4 (four) times daily as needed for diarrhea or loose stools. 08/24/22   Jeralyn Ruths, MD  furosemide (LASIX) 40 MG tablet TAKE 1 TABLET BY MOUTH ONCE DAILY AS NEEDED FOR FLUID OR EDEMA 10/26/21   Jacky Kindle, [name]  glipiZIDE (GLUCOTROL) 5 MG tablet Take 1 tablet (5 mg total) by mouth 2 (two) times daily before a meal. 05/03/23   Jacky Kindle, [name]  glucose blood (CONTOUR NEXT TEST) test strip Test fasting sugar each morning. Recheck if having hypoglycemic symptoms. 10/03/18   Chrismon, Jodell Cipro, PA-C  hydrochlorothiazide (HYDRODIURIL) 25 MG tablet Take 1 tablet by mouth once daily 08/07/23   Jacky Kindle, [name]  hydrocortisone (ANUSOL-HC) 2.5 % rectal cream Place 1 Application rectally 2 (two) times daily. 01/14/23   Toney Reil, MD  letrozole [facility]) 2.5 MG tablet Take 1 tablet (2.5 mg total) by mouth daily. 10/25/22   Jeralyn Ruths, MD  levothyroxine (SYNTHROID) 75 MCG tablet TAKE 1 TABLET BY MOUTH ONCE DAILY BEFORE BREAKFAST 07/18/23   Merita Norton T, [name]  lisinopril (ZESTRIL) 40 MG tablet Take 1 tablet (40 mg total) by mouth daily. 05/03/23   Jacky Kindle, [name]  metFORMIN (GLUCOPHAGE) 1000 MG tablet Take 1 tablet (1,000 mg total) by mouth 2 (two) times daily with a meal. 01/25/23   Jacky Kindle, [name]  Multiple Vitamin (MULTIVITAMIN) tablet Take 1 tablet by mouth every morning.    [provider]  mupirocin ointment (BACTROBAN) 2 % Apply 1 Application topically daily. 01/25/23   Jacky Kindle, [name]  omeprazole (PRILOSEC) 40 MG capsule TAKE 1 CAPSULE BY MOUTH ONCE DAILY BEFORE BREAKFAST 07/15/23   Vanga, [name], MD  PARoxetine (PAXIL) 20 MG tablet Take 1 tablet (20 mg total) by mouth daily. 05/03/23   Jacky Kindle, [name]  sitaGLIPtin (JANUVIA) 100 MG tablet Take 1 tablet (100 mg total) by mouth daily. 02/01/23   Jacky Kindle, [name]  traMADol (ULTRAM) 50 MG tablet Take 1 tablet (50 mg total) by mouth  every 6 (six) hours as needed (pain). 11/07/22   Zenia Resides, MD  triamcinolone ointment (KENALOG) 0.5 % Apply 1 Application topically 2 (two) times daily. 01/25/23   Jacky Kindle, [name]  zinc gluconate 50 MG tablet Take 50 mg by mouth daily.    [provider]    Family History Family History  Problem Relation Age of Onset   Hyperlipidemia Mother    Thyroid disease Mother    Hashimoto's thyroiditis Mother    Heart disease Father        open heart surgery   Heart failure Father    Diabetes Father    Dementia Father    Thyroid disease Brother    Breast cancer Maternal Grandmother    Diabetes Paternal Grandmother    Prostate cancer Paternal Grandfather     Social History Social History   Tobacco Use   Smoking status: Former    Current packs/day: 0.00    Average packs/day: 1.5 packs/day for 25.0 years (37.5 ttl pk-yrs)    Types: Cigarettes    Start date: 11/24/1976    Quit date: 11/24/2001    Years since quitting: 21.8   Smokeless tobacco: Never  Vaping Use   Vaping status: Never Used  Substance Use Topics   Alcohol use: Yes    Alcohol/week: 0.0 standard drinks of alcohol    Comment: OCCASIONALLY DRINKS WINE, ONCE A WEEK   Drug use: No     Allergies   Fosaprepitant   Review of Systems Review of Systems   Physical Exam Triage Vital Signs ED Triage Vitals  Encounter Vitals Group     BP 09/28/23 1101 (!) 159/68     Systolic BP  Percentile --      Diastolic BP Percentile --      Pulse Rate 09/28/23 1101 72     Resp 09/28/23 1101 16     Temp 09/28/23 1101 99.1 F (37.3 C)     Temp Source 09/28/23 1101 Oral     SpO2 09/28/23 1101 92 %     Weight --      Height --      Head Circumference --      Peak Flow --      Pain Score 09/28/23 1109 3     Pain Loc --      Pain Education --      Exclude from Growth Chart --    No data found.  Updated Vital Signs BP (!) 159/68 (BP Location: Left Arm)   Pulse 72   Temp 99.1 F (37.3 C) (Oral)   Resp 16   SpO2 92%   Visual Acuity Right Eye Distance:   Left Eye Distance:   Bilateral Distance:    Right Eye Near:   Left Eye Near:    Bilateral Near:     Physical Exam Constitutional:      Appearance: Normal appearance.  HENT:     Head: Normocephalic.     Right Ear: Tympanic membrane, ear canal and external ear normal.     Left Ear: Tympanic membrane, ear canal and external ear normal.     Nose: Congestion present. No rhinorrhea.     Mouth/Throat:     Pharynx: Posterior oropharyngeal erythema present. No oropharyngeal exudate.  Eyes:     Extraocular Movements: Extraocular movements intact.  Cardiovascular:     Rate and Rhythm: Normal rate and regular rhythm.     Pulses: Normal pulses.     Heart sounds: Normal heart  sounds.  Pulmonary:     Effort: Pulmonary effort is normal.     Breath sounds: Normal breath sounds.  Musculoskeletal:     Cervical back: Normal range of motion and neck supple.  Neurological:     Mental Status: She is alert and oriented to person, place, and time. Mental status is at baseline.      UC Treatments / Results  Labs (all labs ordered are listed, but only abnormal results are displayed) Labs Reviewed - No data to display  EKG   Radiology No results found.  Procedures Procedures (including critical care time)  Medications Ordered in UC Medications - No data to display  Initial Impression / Assessment and Plan / UC  Course  I have reviewed the triage vital signs and the nursing notes.  Pertinent labs & imaging results that were available during my care of the patient were reviewed by me and considered in my medical decision making (see chart for details).  Viral URI with cough  Patient is in no signs of distress nor toxic appearing.  Vital signs are stable.  Low suspicion for pneumonia, pneumothorax or bronchitis and therefore will defer imaging.  Actively receiving cancer treatment, prophylactically placed on azithromycin to provide protection to the lungs and airway, azithromycin sent to pharmacy.  As she has been experiencing shortness of breath and wheezing at home prescribed prednisone and Tessalon for management of cough. May use additional over-the-counter medications as needed for supportive care.  May follow-up with urgent care as needed if symptoms persist or worsen.   Final Clinical Impressions(s) / UC Diagnoses   Final diagnoses:  Viral URI with cough     Discharge Instructions      Your symptoms today are most likely being caused by a virus and should steadily improve in time it can take up to 7 to 10 days before you truly start to see a turnaround however things will get better  Begin azithromycin to provide coverage for bacteria to keep symptoms from progressing to a more serious respiratory illness  Begin prednisone every morning with food to open and relax the airway, should settle persistent coughing, shortness of breath and wheezing  May use Tessalon pill every 8 hours as needed to help calm coughing, may take this in addition to DayQuil and NyQuil  You can take Tylenol and/or Ibuprofen as needed for fever reduction and pain relief.   For cough: honey 1/2 to 1 teaspoon (you can dilute the honey in water or another fluid).  You can also use guaifenesin and dextromethorphan for cough. You can use a humidifier for chest congestion and cough.  If you don't have a humidifier, you can  sit in the bathroom with the hot shower running.      For sore throat: try warm salt water gargles, cepacol lozenges, throat spray, warm tea or water with lemon/honey, popsicles or ice, or OTC cold relief medicine for throat discomfort.   For congestion: take a daily anti-histamine like Zyrtec, Claritin, and a oral decongestant, such as pseudoephedrine.  You can also use Flonase 1-2 sprays in each nostril daily.   It is important to stay hydrated: drink plenty of fluids (water, gatorade/powerade/pedialyte, juices, or teas) to keep your throat moisturized and help further relieve irritation/discomfort.     ED Prescriptions     Medication Sig Dispense Auth. Provider   predniSONE (DELTASONE) 20 MG tablet Take 2 tablets (40 mg total) by mouth daily. 10 tablet Valinda Hoar, NP  azithromycin (ZITHROMAX) 250 MG tablet Take 1 tablet (250 mg total) by mouth daily. Take first 2 tablets together, then 1 every day until finished. 6 tablet Kalani Baray R, NP   benzonatate (TESSALON) 100 MG capsule Take 1 capsule (100 mg total) by mouth every 8 (eight) hours. 30 capsule Valinda Hoar, NP      PDMP not reviewed this encounter.   Valinda Hoar, NP 09/28/23 [phone]

## 2023-10-15 ENCOUNTER — Other Ambulatory Visit: Payer: Self-pay | Admitting: Family Medicine

## 2023-10-15 ENCOUNTER — Other Ambulatory Visit: Payer: Self-pay | Admitting: Oncology

## 2023-10-15 NOTE — Telephone Encounter (Signed)
Requested Prescriptions  Pending Prescriptions Disp Refills   levothyroxine (SYNTHROID) 75 MCG tablet [Pharmacy Med Name: Levothyroxine Sodium 75 MCG Oral Tablet] 90 tablet 2    Sig: TAKE 1 TABLET BY MOUTH ONCE DAILY BEFORE BREAKFAST     Endocrinology:  Hypothyroid Agents Passed - 10/15/2023  6:50 AM      Passed - TSH in normal range and within 360 days    TSH  Date Value Ref Range Status  05/03/2023 4.320 0.450 - 4.500 uIU/mL Final         Passed - Valid encounter within last 12 months    Recent Outpatient Visits           5 months ago Annual physical exam   [facility] Merita Norton T, [name]   8 months ago Type 2 diabetes mellitus with hyperglycemia, without long-term current use of insulin [facility])   [facility] Merita Norton T, [name]   1 year ago Type 2 diabetes mellitus with hyperglycemia, without long-term current use of insulin [facility])   [place] [facility] Merita Norton T, [name]   1 year ago Type 2 diabetes mellitus with hyperglycemia, without long-term current use of insulin [facility])   [place] [facility] Merita Norton T, [name]   1 year ago Type 2 diabetes mellitus without complication, without long-term current use of insulin [facility])   [facility] Jacky Kindle, [place]

## 2023-10-29 ENCOUNTER — Other Ambulatory Visit: Payer: Self-pay | Admitting: Family Medicine

## 2023-11-05 ENCOUNTER — Other Ambulatory Visit: Payer: Self-pay | Admitting: Family Medicine

## 2023-11-12 ENCOUNTER — Other Ambulatory Visit: Payer: Self-pay | Admitting: Family Medicine

## 2023-11-12 DIAGNOSIS — I152 Hypertension secondary to endocrine disorders: Secondary | ICD-10-CM

## 2023-12-09 ENCOUNTER — Inpatient Hospital Stay: Payer: 59 | Attending: Oncology

## 2023-12-09 DIAGNOSIS — C50812 Malignant neoplasm of overlapping sites of left female breast: Secondary | ICD-10-CM | POA: Diagnosis not present

## 2023-12-09 DIAGNOSIS — Z170 Estrogen receptor positive status [ER+]: Secondary | ICD-10-CM | POA: Insufficient documentation

## 2023-12-09 DIAGNOSIS — Z452 Encounter for adjustment and management of vascular access device: Secondary | ICD-10-CM | POA: Insufficient documentation

## 2023-12-09 DIAGNOSIS — Z95828 Presence of other vascular implants and grafts: Secondary | ICD-10-CM

## 2023-12-09 MED ORDER — SODIUM CHLORIDE 0.9% FLUSH
10.0000 mL | Freq: Once | INTRAVENOUS | Status: AC
  Administered 2023-12-09: 10 mL via INTRAVENOUS
  Filled 2023-12-09: qty 10

## 2023-12-09 MED ORDER — HEPARIN SOD (PORK) LOCK FLUSH 100 UNIT/ML IV SOLN
500.0000 [IU] | Freq: Once | INTRAVENOUS | Status: AC
  Administered 2023-12-09: 500 [IU] via INTRAVENOUS
  Filled 2023-12-09: qty 5

## 2023-12-17 ENCOUNTER — Other Ambulatory Visit: Payer: Self-pay | Admitting: Family Medicine

## 2023-12-17 DIAGNOSIS — F411 Generalized anxiety disorder: Secondary | ICD-10-CM

## 2023-12-17 NOTE — Telephone Encounter (Signed)
Please advise 

## 2023-12-21 ENCOUNTER — Encounter: Payer: Self-pay | Admitting: Oncology

## 2023-12-26 ENCOUNTER — Ambulatory Visit: Payer: 59 | Admitting: Oncology

## 2023-12-27 ENCOUNTER — Encounter: Payer: Self-pay | Admitting: Oncology

## 2023-12-27 ENCOUNTER — Inpatient Hospital Stay: Payer: No Typology Code available for payment source | Attending: Oncology | Admitting: Oncology

## 2023-12-27 VITALS — BP 158/68 | HR 65 | Temp 98.5°F | Resp 16 | Ht 64.0 in | Wt 187.0 lb

## 2023-12-27 DIAGNOSIS — F419 Anxiety disorder, unspecified: Secondary | ICD-10-CM | POA: Diagnosis not present

## 2023-12-27 DIAGNOSIS — Z8249 Family history of ischemic heart disease and other diseases of the circulatory system: Secondary | ICD-10-CM | POA: Diagnosis not present

## 2023-12-27 DIAGNOSIS — I350 Nonrheumatic aortic (valve) stenosis: Secondary | ICD-10-CM | POA: Insufficient documentation

## 2023-12-27 DIAGNOSIS — Z9049 Acquired absence of other specified parts of digestive tract: Secondary | ICD-10-CM | POA: Diagnosis not present

## 2023-12-27 DIAGNOSIS — I10 Essential (primary) hypertension: Secondary | ICD-10-CM | POA: Insufficient documentation

## 2023-12-27 DIAGNOSIS — C50212 Malignant neoplasm of upper-inner quadrant of left female breast: Secondary | ICD-10-CM

## 2023-12-27 DIAGNOSIS — Z170 Estrogen receptor positive status [ER+]: Secondary | ICD-10-CM

## 2023-12-27 DIAGNOSIS — Z95828 Presence of other vascular implants and grafts: Secondary | ICD-10-CM

## 2023-12-27 DIAGNOSIS — Z803 Family history of malignant neoplasm of breast: Secondary | ICD-10-CM | POA: Diagnosis not present

## 2023-12-27 DIAGNOSIS — Z87891 Personal history of nicotine dependence: Secondary | ICD-10-CM | POA: Diagnosis not present

## 2023-12-27 DIAGNOSIS — D649 Anemia, unspecified: Secondary | ICD-10-CM | POA: Insufficient documentation

## 2023-12-27 DIAGNOSIS — Z79899 Other long term (current) drug therapy: Secondary | ICD-10-CM | POA: Diagnosis not present

## 2023-12-27 DIAGNOSIS — Z818 Family history of other mental and behavioral disorders: Secondary | ICD-10-CM | POA: Insufficient documentation

## 2023-12-27 DIAGNOSIS — Z79811 Long term (current) use of aromatase inhibitors: Secondary | ICD-10-CM | POA: Diagnosis not present

## 2023-12-27 DIAGNOSIS — Z833 Family history of diabetes mellitus: Secondary | ICD-10-CM | POA: Insufficient documentation

## 2023-12-27 DIAGNOSIS — K7460 Unspecified cirrhosis of liver: Secondary | ICD-10-CM | POA: Diagnosis not present

## 2023-12-27 DIAGNOSIS — Z8042 Family history of malignant neoplasm of prostate: Secondary | ICD-10-CM | POA: Diagnosis not present

## 2023-12-27 DIAGNOSIS — Z8349 Family history of other endocrine, nutritional and metabolic diseases: Secondary | ICD-10-CM | POA: Diagnosis not present

## 2023-12-27 DIAGNOSIS — Z83438 Family history of other disorder of lipoprotein metabolism and other lipidemia: Secondary | ICD-10-CM | POA: Diagnosis not present

## 2023-12-27 DIAGNOSIS — Z1721 Progesterone receptor positive status: Secondary | ICD-10-CM | POA: Insufficient documentation

## 2023-12-27 DIAGNOSIS — Z90710 Acquired absence of both cervix and uterus: Secondary | ICD-10-CM | POA: Insufficient documentation

## 2023-12-27 DIAGNOSIS — E119 Type 2 diabetes mellitus without complications: Secondary | ICD-10-CM | POA: Diagnosis not present

## 2023-12-27 NOTE — Progress Notes (Signed)
 Has a scab on right breast that has been there since June.

## 2023-12-27 NOTE — Progress Notes (Signed)
 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    INTERVAL HISTORY: Patient returns to clinic today for routine 31-month evaluation.  She is tolerating letrozole  well without significant side effects.  She currently feels well and is asymptomatic.  She has no neurologic complaints.  She denies any recent fevers or illnesses.  She has a good appetite and denies weight loss.  She has no chest pain, shortness of breath, cough, or hemoptysis.  She denies any nausea, vomiting, constipation, or diarrhea.  She has no urinary complaints.  Patient offers no specific complaints today.  REVIEW OF SYSTEMS:   Review of Systems  Constitutional: Negative.  Negative for fever, malaise/fatigue and weight loss.  Respiratory: Negative.  Negative for cough and shortness of breath.   Cardiovascular: Negative.  Negative for chest pain and leg swelling.  Gastrointestinal: Negative.  Negative for abdominal pain and diarrhea.  Genitourinary: Negative.  Negative for dysuria.  Musculoskeletal: Negative.  Negative for back pain.  Skin: Negative.  Negative for rash.  Neurological: Negative.  Negative for dizziness, focal weakness, weakness and headaches.  Psychiatric/Behavioral: Negative.  The patient is not nervous/anxious.     As per HPI. Otherwise, a complete review of systems is negative.  PAST MEDICAL HISTORY: Past Medical History:  Diagnosis Date   Anxiety    Aortic stenosis    Cirrhosis (HCC)    Diabetes mellitus    Edema    Heart murmur     Hypertension    Sleep apnea    Thyroid  disease     PAST SURGICAL HISTORY: Past Surgical History:  Procedure Laterality Date   ABDOMINAL HYSTERECTOMY  11/17/2013   BREAST BIOPSY     x2   BREAST BIOPSY Left 07/20/2021   US  Bx, Ribbon Clip, [facility]   BREAST BIOPSY Left 07/20/2021   Stereo Bx, X-clip, [facility]   BREAST BIOPSY Left 07/20/2021   Stereo biopsy, coil clip, [facility]IS   BREAST BIOPSY Right 07/20/2021   Stereo Bx, Ribbon Clip, Fat necrosis   CESAREAN SECTION     x 2   CHOLECYSTECTOMY  12/17/2001   DUE TO STONES   COLONOSCOPY WITH PROPOFOL  N/A 07/26/2022   Procedure: COLONOSCOPY WITH PROPOFOL ;  Surgeon: Unk Corinn Skiff, MD;  Location: [facility];  Service: Gastroenterology;  Laterality: N/A;   DILATION AND CURETTAGE OF UTERUS     ELBOW SURGERY     ESOPHAGOGASTRODUODENOSCOPY (EGD) WITH PROPOFOL  N/A 07/26/2022   Procedure: ESOPHAGOGASTRODUODENOSCOPY (EGD) WITH PROPOFOL ;  Surgeon: Unk Corinn Skiff, MD;  Location: [facility];  Service: Gastroenterology;  Laterality: N/A;   POLYPECTOMY  12/17/1993   PORTACATH PLACEMENT Right 10/25/2021   Procedure: INSERTION PORT-A-CATH;  Surgeon: Lane Shope, MD;  Location: [facility] ORS;  Service: General;  Laterality: Right;   SIMPLE MASTECTOMY WITH AXILLARY SENTINEL NODE BIOPSY Left 08/16/2021   Procedure: SIMPLE MASTECTOMY WITH AXILLARY SENTINEL NODE BIOPSY;  Surgeon: Lane Shope, MD;  Location: [facility] OR[facility];  Service: General;  Laterality: Left;    FAMILY HISTORY: Family History  Problem Relation Age of Onset   Hyperlipidemia Mother    Thyroid  disease Mother    Hashimoto's thyroiditis Mother  Heart disease Father        open heart surgery   Heart failure Father    Diabetes Father    Dementia Father    Thyroid  disease Brother    Breast cancer Maternal Grandmother    Diabetes Paternal Grandmother    Prostate cancer Paternal Grandfather     ADVANCED DIRECTIVES (Y/N):  N  HEALTH MAINTENANCE: Social History   Tobacco  Use   Smoking status: Former    Current packs/day: 0.00    Average packs/day: 1.5 packs/day for 25.0 years (37.5 ttl pk-yrs)    Types: Cigarettes    Start date: 11/24/1976    Quit date: 11/24/2001    Years since quitting: 22.1   Smokeless tobacco: Never  Vaping Use   Vaping status: Never Used  Substance Use Topics   Alcohol use: Yes    Alcohol/week: 0.0 standard drinks of alcohol    Comment: OCCASIONALLY DRINKS WINE, ONCE A WEEK   Drug use: No     Colonoscopy:  PAP:  Bone density:  Lipid panel:  Allergies  Allergen Reactions   Fosaprepitant  Nausea Only and Other (See Comments)    Back pain, nausea. See progress note from 10/27/2021     Current Outpatient Medications  Medication Sig Dispense Refill   amLODipine  (NORVASC ) 5 MG tablet Take 1 tablet by mouth once daily 90 tablet 0   Ascorbic Acid (VITAMIN C PO) Take 1 tablet by mouth daily.     aspirin 81 MG EC tablet Take 81 mg by mouth at bedtime.     carvedilol  (COREG ) 25 MG tablet Take 1 tablet by mouth twice daily 180 tablet 0   Cholecalciferol (VITAMIN D3) 1000 UNITS CAPS Take 1,000 Units by mouth at bedtime.     furosemide  (LASIX ) 40 MG tablet TAKE 1 TABLET BY MOUTH ONCE DAILY AS NEEDED FOR FLUID OR EDEMA 90 tablet 1   glipiZIDE  (GLUCOTROL ) 5 MG tablet TAKE 1 TABLET BY MOUTH TWICE DAILY BEFORE A MEAL 180 tablet 0   glucose blood (CONTOUR NEXT TEST) test strip Test fasting sugar each morning. Recheck if having hypoglycemic symptoms. 100 each 4   hydrochlorothiazide  (HYDRODIURIL ) 25 MG tablet Take 1 tablet by mouth once daily 90 tablet 0   hydrocortisone  (ANUSOL -HC) 2.5 % rectal cream Place 1 Application rectally 2 (two) times daily. 30 g 0   letrozole  (FEMARA ) 2.5 MG tablet Take 1 tablet by mouth once daily 90 tablet 0   levothyroxine  (SYNTHROID ) 75 MCG tablet TAKE 1 TABLET BY MOUTH ONCE DAILY BEFORE BREAKFAST 90 tablet 2   lisinopril  (ZESTRIL ) 40 MG tablet Take 1 tablet by mouth once daily 90 tablet 0   metFORMIN   (GLUCOPHAGE ) 1000 MG tablet Take 1 tablet (1,000 mg total) by mouth 2 (two) times daily with a meal. 180 tablet 3   Multiple Vitamin (MULTIVITAMIN) tablet Take 1 tablet by mouth every morning.     mupirocin  ointment (BACTROBAN ) 2 % Apply 1 Application topically daily. 22 g 0   PARoxetine  (PAXIL ) 20 MG tablet Take 1 tablet by mouth once daily 90 tablet 1   zinc gluconate 50 MG tablet Take 50 mg by mouth daily.     diphenoxylate -atropine  (LOMOTIL ) 2.5-0.025 MG tablet Take 2 tablets by mouth 4 (four) times daily as needed for diarrhea or loose stools. (Patient not taking: Reported on 12/27/2023) 60 tablet 1   omeprazole  (PRILOSEC) 40 MG capsule TAKE 1 CAPSULE BY MOUTH ONCE DAILY BEFORE BREAKFAST (Patient not taking: Reported on 12/27/2023) 30 capsule 0  predniSONE  (DELTASONE ) 20 MG tablet Take 2 tablets (40 mg total) by mouth daily. (Patient not taking: Reported on 12/27/2023) 10 tablet 0   traMADol  (ULTRAM ) 50 MG tablet Take 1 tablet (50 mg total) by mouth every 6 (six) hours as needed (pain). (Patient not taking: Reported on 12/27/2023) 12 tablet 0   triamcinolone  ointment (KENALOG ) 0.5 % Apply 1 Application topically 2 (two) times daily. (Patient not taking: Reported on 12/27/2023) 30 g 0   No current facility-administered medications for this visit.    OBJECTIVE: Vitals:   12/27/23 1105  BP: (!) 158/68  Pulse: 65  Resp: 16  Temp: 98.5 F (36.9 C)  SpO2: 98%     Body mass index is 32.1 kg/m.    ECOG FS:0 - Asymptomatic  General: Well-developed, well-nourished, no acute distress. Eyes: Pink conjunctiva, anicteric sclera. HEENT: Normocephalic, moist mucous membranes. Breasts: Exam deferred today. Lungs: No audible wheezing or coughing. Heart: Regular rate and rhythm. Abdomen: Soft, nontender, no obvious distention. Musculoskeletal: No edema, cyanosis, or clubbing. Neuro: Alert, answering all questions appropriately. Cranial nerves grossly intact. Skin: No rashes or petechiae  noted. Psych: Normal affect.  LAB RESULTS:  Lab Results  Component Value Date   NA 137 06/14/2023   K 4.1 06/14/2023   CL 100 06/14/2023   CO2 26 06/14/2023   GLUCOSE 237 (H) 06/14/2023   BUN 24 (H) 06/14/2023   CREATININE 0.83 06/14/2023   CALCIUM  8.9 06/14/2023   PROT 7.1 06/14/2023   ALBUMIN 4.3 06/14/2023   AST 25 06/14/2023   ALT 28 06/14/2023   ALKPHOS 47 06/14/2023   BILITOT 0.5 06/14/2023   GFRNONAA >60 06/14/2023   GFRAA 111 10/14/2020    Lab Results  Component Value Date   WBC 4.7 06/14/2023   NEUTROABS 3.0 06/14/2023   HGB 10.9 (L) 06/14/2023   HCT 35.1 (L) 06/14/2023   MCV 79.6 (L) 06/14/2023   PLT 96 (L) 06/14/2023     STUDIES: No results found.  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  Previously, right breast biopsy was negative for malignancy. Patient only received 3 cycles of treatment, then carboplatinum and Taxotere  were discontinued secondary to persistent thrombocytopenia.  She last received carboplatinum and Taxotere  on December 22, 2021.  Patient was subsequently transitioned to maintenance Perjeta  and Herceptin  which she completed on October 25, 2022.  Continue letrozole  completing 5 years of treatment in November 2028.  Her most recent mammogram on June 28, 2023 was reported as BI-RADS 1.  Repeat in July 2025.  Patient will have video-assisted telemedicine visit 2 to 3 days after her next mammogram for further evaluation.   Bone health: Bone mineral density completed on February 19, 2023 reported T-score of -0.1 which is considered normal.  Will repeat bone mineral density along with mammogram in July 2026.   Thrombocytopenia: Chronic and unchanged.  Patient's most recent platelet count on  June 14, 2023 was reported 96. Anemia: Chronic and unchanged.  Patient's most recent hemoglobin is 10.9. NASH: Continue follow-up with GI as scheduled.   Patient expressed understanding and was in agreement with this plan. She also understands that She can call clinic at any time with any questions, concerns, or complaints.    Cancer Staging  Carcinoma of left breast [facility]) Staging form: Breast, AJCC 8th Edition - Pathologic stage from 08/30/2021: Stage IB (pT2, pN1a, cM0, G3, ER+, PR+, HER2+, Oncotype DX score: 18) - Signed by Jacobo Evalene PARAS, MD on 09/13/2021 Stage prefix: Initial diagnosis Multigene prognostic tests performed: Oncotype DX Recurrence score range: Greater than or equal to 11 Histologic grading system: 3 grade system  Evalene PARAS Jacobo, MD   12/27/2023 1:02 PM

## 2023-12-28 ENCOUNTER — Other Ambulatory Visit: Payer: Self-pay

## 2023-12-31 ENCOUNTER — Ambulatory Visit: Payer: No Typology Code available for payment source | Admitting: Surgery

## 2024-01-02 ENCOUNTER — Encounter: Payer: Self-pay | Admitting: Surgery

## 2024-01-02 ENCOUNTER — Ambulatory Visit (INDEPENDENT_AMBULATORY_CARE_PROVIDER_SITE_OTHER): Payer: No Typology Code available for payment source | Admitting: Surgery

## 2024-01-02 VITALS — BP 168/74 | HR 66 | Temp 98.4°F | Ht 64.0 in | Wt 186.8 lb

## 2024-01-02 DIAGNOSIS — Z170 Estrogen receptor positive status [ER+]: Secondary | ICD-10-CM | POA: Diagnosis not present

## 2024-01-02 DIAGNOSIS — C50212 Malignant neoplasm of upper-inner quadrant of left female breast: Secondary | ICD-10-CM

## 2024-01-02 DIAGNOSIS — Z452 Encounter for adjustment and management of vascular access device: Secondary | ICD-10-CM | POA: Diagnosis not present

## 2024-01-02 NOTE — Progress Notes (Signed)
SURGICAL OPERATIVE REPORT  DATE OF PROCEDURE: 01/02/2024  ANESTHESIA: Local  PRE-OPERATIVE DIAGNOSIS: Tunneled right IJ central venous catheter with subcutaneous port, no longer needed (icd-10: Y78.295)  POST-OPERATIVE DIAGNOSIS:  Same  PROCEDURE(S):  1.) Removal of indwelling right IJ central venous catheter with subcutaneous port (cpt: 36590)  INTRAOPERATIVE FINDINGS: Well-incorporated tunneled right IJ central venous catheter with subcutaneous port without erythema or purulence  INTRAVENOUS FLUIDS: 0 mL crystalloid   ESTIMATED BLOOD LOSS: Minimal (< 5 mL)  SPECIMEN/EXPLANT: complete,   DRAINS: none  COMPLICATIONS: None apparent  CONDITION AT END OF PROCEDURE: Hemodynamically stable and awake  DISPOSITION OF PATIENT: Home.  INDICATIONS FOR PROCEDURE:  Patient is s/p complete utilization of a right IJ central venous catheter with indwelling subcutaneous port, who now presents for evaluation and removal of the no longer needed port. Patient reports the port was never infected and always functioned properly, but since it's reportedly no longer needed, and  wishes to have it removed. All risks, benefits, and alternatives to above procedure were discussed with the patient, all of patient's questions were answered to his expressed satisfaction, and informed consent was obtained and documented.  DETAILS OF PROCEDURE: Patient was brought to the procedure suite and appropriately identified. In supine position, operative site was prepped and draped in the usual sterile fashion, and following a brief time out, lidocaine was injected in the subcutaneous tissue overlying the port, a 2 cm incision was made through the previous scar to accommodate the port. A combination of blunt dissection, sharp dissection  were used to free the port and attached catheter from its surrounding capsule and tissues. The port was then able to be removed from its subcutaneous pocket, the catheter was confirmed to  slide freely, and the catheter was removed with immediate focal pressure applied to the venous insertion site for hemostasis. Hemostasis was then confirmed, the subcutaneous pocket was evaluated, and the wound was then closed in layers, using 3-0 Vicryl buried interrupted suture to re-approximate dermis.  Skin was then cleaned, dried, and sterile Dermabond was applied and allowed to dry.  Campbell Lerner, M.D., [facility]  01/02/2024 ; 3:35 PM

## 2024-01-02 NOTE — Patient Instructions (Signed)
Implanted Port Removal, Care After The following information offers guidance on how to care for yourself after your procedure. Your health care provider may also give you more specific instructions. If you have problems or questions, contact your health care provider. What can I expect after the procedure? After the procedure, it is common to have: Soreness or pain near your incision. Some swelling or bruising near your incision. Follow these instructions at home: Medicines Take over-the-counter and prescription medicines only as told by your health care provider. If you were prescribed an antibiotic medicine, take it as told by your health care provider. Do not stop taking the antibiotic even if you start to feel better. Bathing Do not take baths, swim, or use a hot tub until your health care provider approves. Ask your health care provider if you can take showers. You may only be allowed to take sponge baths. Incision care  Follow instructions from your health care provider about how to take care of your incision. Make sure you: Wash your hands with soap and water for at least 20 seconds before and after you change your bandage (dressing). If soap and water are not available, use hand sanitizer. Change your dressing as told by your health care provider. Keep your dressing dry. Leave stitches (sutures), skin glue, or adhesive strips in place. These skin closures may need to stay in place for 2 weeks or longer. If adhesive strip edges start to loosen and curl up, you may trim the loose edges. Do not remove adhesive strips completely unless your health care provider tells you to do that. Check your incision area every day for signs of infection. Check for: More redness, swelling, or pain. More fluid or blood. Warmth. Pus or a bad smell. Activity Return to your normal activities as told by your health care provider. Ask your health care provider what activities are safe for you. You may have  to avoid lifting. Ask your health care provider how much you can safely lift. Do not do activities that involve lifting your arms over your head. Driving  If you were given a sedative during the procedure, it can affect you for several hours. Do not drive or operate machinery until your health care provider says that it is safe. If you did not receive a sedative, ask your health care provider when it is safe to drive. General instructions Do not use any products that contain nicotine or tobacco. These products include cigarettes, chewing tobacco, and vaping devices, such as e-cigarettes. These can delay healing after surgery. If you need help quitting, ask your health care provider. Keep all follow-up visits. This is important. Contact a health care provider if: You have a fever or chills. You have more redness, swelling, or pain around your incision. You have more fluid or blood coming from your incision. Your incision feels warm to the touch. You have pus or a bad smell coming from your incision. You have pain that is not relieved by your pain medicine. Get help right away if: You have chest pain. You have difficulty breathing. These symptoms may be an emergency. Get help right away. Call 911. Do not wait to see if the symptoms will go away. Do not drive yourself to the hospital. Summary After the procedure, it is common to have pain, soreness, swelling, or bruising near your incision. If you were prescribed an antibiotic medicine, take it as told by your health care provider. Do not stop taking the antibiotic even if you   start to feel better. If you were given a sedative during the procedure, it can affect you for several hours. Do not drive or operate machinery until your health care provider says that it is safe. Return to your normal activities as told by your health care provider. Ask your health care provider what activities are safe for you. This information is not intended to  replace advice given to you by your health care provider. Make sure you discuss any questions you have with your health care provider. Document Revised: 06/06/2021 Document Reviewed: 06/06/2021 Elsevier Patient Education  2024 [employer]  

## 2024-01-11 ENCOUNTER — Other Ambulatory Visit: Payer: Self-pay | Admitting: Oncology

## 2024-01-13 ENCOUNTER — Encounter: Payer: Self-pay | Admitting: Oncology

## 2024-01-21 ENCOUNTER — Telehealth: Payer: Self-pay | Admitting: Family Medicine

## 2024-01-21 MED ORDER — LISINOPRIL 40 MG PO TABS: 40.0000 mg | ORAL_TABLET | Freq: Every day | ORAL | 0 refills | Status: DC

## 2024-01-21 NOTE — Telephone Encounter (Signed)
[facility] pharmacy faxed refill request for the following medications:   lisinopril (ZESTRIL) 40 MG tablet     Please advise

## 2024-02-05 ENCOUNTER — Telehealth: Payer: Self-pay

## 2024-02-05 NOTE — Telephone Encounter (Signed)
 Patient was identified as falling into the [place] Measure - Diabetes.   Patient was: Appointment scheduled with primary care provider in the next 30 days.

## 2024-02-07 ENCOUNTER — Encounter: Payer: Self-pay | Admitting: Family Medicine

## 2024-02-07 ENCOUNTER — Ambulatory Visit: Payer: No Typology Code available for payment source | Admitting: Family Medicine

## 2024-02-07 VITALS — BP 131/65 | HR 67 | Resp 16 | Ht 64.0 in | Wt 189.2 lb

## 2024-02-07 DIAGNOSIS — I471 Supraventricular tachycardia, unspecified: Secondary | ICD-10-CM

## 2024-02-07 DIAGNOSIS — C50812 Malignant neoplasm of overlapping sites of left female breast: Secondary | ICD-10-CM

## 2024-02-07 DIAGNOSIS — R600 Localized edema: Secondary | ICD-10-CM | POA: Diagnosis not present

## 2024-02-07 DIAGNOSIS — E1159 Type 2 diabetes mellitus with other circulatory complications: Secondary | ICD-10-CM | POA: Diagnosis not present

## 2024-02-07 DIAGNOSIS — E559 Vitamin D deficiency, unspecified: Secondary | ICD-10-CM

## 2024-02-07 DIAGNOSIS — E785 Hyperlipidemia, unspecified: Secondary | ICD-10-CM

## 2024-02-07 DIAGNOSIS — E1169 Type 2 diabetes mellitus with other specified complication: Secondary | ICD-10-CM | POA: Diagnosis not present

## 2024-02-07 DIAGNOSIS — Z79899 Other long term (current) drug therapy: Secondary | ICD-10-CM

## 2024-02-07 DIAGNOSIS — E1142 Type 2 diabetes mellitus with diabetic polyneuropathy: Secondary | ICD-10-CM

## 2024-02-07 DIAGNOSIS — K219 Gastro-esophageal reflux disease without esophagitis: Secondary | ICD-10-CM

## 2024-02-07 DIAGNOSIS — E039 Hypothyroidism, unspecified: Secondary | ICD-10-CM

## 2024-02-07 DIAGNOSIS — I152 Hypertension secondary to endocrine disorders: Secondary | ICD-10-CM

## 2024-02-07 DIAGNOSIS — K7460 Unspecified cirrhosis of liver: Secondary | ICD-10-CM

## 2024-02-07 DIAGNOSIS — Z170 Estrogen receptor positive status [ER+]: Secondary | ICD-10-CM

## 2024-02-07 MED ORDER — FUROSEMIDE 40 MG PO TABS: ORAL_TABLET | ORAL | 1 refills | Status: DC

## 2024-02-07 MED ORDER — OZEMPIC (0.25 OR 0.5 MG/DOSE) 2 MG/3ML ~~LOC~~ SOPN
0.2500 mg | PEN_INJECTOR | SUBCUTANEOUS | 1 refills | Status: DC
Start: 2024-02-07 — End: 2024-10-27

## 2024-02-07 NOTE — Assessment & Plan Note (Signed)
Noted.  No acute concerns.

## 2024-02-07 NOTE — Assessment & Plan Note (Signed)
On levothyroxine. Thyroid function tests ordered to monitor current status. - Order thyroid function tests - Continue current dose of levothyroxine

## 2024-02-07 NOTE — Assessment & Plan Note (Signed)
Chronic, stable.  No acute concerns. Schedule follow-up with cardiology. Continue carvedilol 25 mg twice daily

## 2024-02-07 NOTE — Patient Instructions (Addendum)
Please schedule an appointment with your cardiologist.  Please schedule  your eye exam.

## 2024-02-07 NOTE — Progress Notes (Signed)
Established patient visit   Patient: Emily Tapia   DOB: August 17, 1961   63 y.o. Female  MRN: [id number] Visit Date: 02/07/2024  Today's healthcare provider: Sherlyn Hay, DO   Chief Complaint  Patient presents with   Medical Management of Chronic Issues    Diabetic Eye exam: not up to date   Subjective    HPI Emily Tapia is a 63 year old female with diabetes and breast cancer who presents with leg swelling.  She experiences significant swelling in her legs, which she feels is disproportionate to her fluid intake. She occasionally experiences chest heaviness and shortness of breath, though these symptoms are not severe. Over-the-counter compression socks provide some relief. She works from home, sitting for 8-10 hours a day, and elevates her legs to manage the swelling.  Her diabetes management is suboptimal. She was previously on Ozempic, which was effective but became unaffordable. Currently, she takes metformin 1000 mg twice daily and glipizide. She experiences neuropathy in her feet and cramps. She also reports a blood blister on her toe that recently burst.  She has a history of breast cancer and underwent chemotherapy, which led to neuropathy in her hands, now resolved. She is currently on letrozole.  She has a history of a heart murmur and an echocardiogram in 07/16/2022 showed an ejection fraction of 60-65% and grade 2 diastolic dysfunction. She has not followed up with cardiology since her breast cancer diagnosis.      Medications: Outpatient Medications Prior to Visit  Medication Sig   amLODipine (NORVASC) 5 MG tablet Take 1 tablet by mouth once daily   Ascorbic Acid (VITAMIN C PO) Take 1 tablet by mouth daily.   aspirin 81 MG EC tablet Take 81 mg by mouth at bedtime.   carvedilol (COREG) 25 MG tablet Take 1 tablet by mouth twice daily   Cholecalciferol (VITAMIN D3) 1000 UNITS CAPS Take 1,000 Units by mouth at bedtime.   glipiZIDE (GLUCOTROL) 5 MG  tablet TAKE 1 TABLET BY MOUTH TWICE DAILY BEFORE A MEAL   glucose blood (CONTOUR NEXT TEST) test strip Test fasting sugar each morning. Recheck if having hypoglycemic symptoms.   hydrochlorothiazide (HYDRODIURIL) 25 MG tablet Take 1 tablet by mouth once daily   letrozole (FEMARA) 2.5 MG tablet Take 1 tablet by mouth once daily   levothyroxine (SYNTHROID) 75 MCG tablet TAKE 1 TABLET BY MOUTH ONCE DAILY BEFORE BREAKFAST   lisinopril (ZESTRIL) 40 MG tablet Take 1 tablet (40 mg total) by mouth daily. NEEDS APPT FOR FURTHER REFILLS   metFORMIN (GLUCOPHAGE) 1000 MG tablet Take 1 tablet (1,000 mg total) by mouth 2 (two) times daily with a meal.   Multiple Vitamin (MULTIVITAMIN) tablet Take 1 tablet by mouth every morning.   PARoxetine (PAXIL) 20 MG tablet Take 1 tablet by mouth once daily   zinc gluconate 50 MG tablet Take 50 mg by mouth daily.   [DISCONTINUED] furosemide (LASIX) 40 MG tablet TAKE 1 TABLET BY MOUTH ONCE DAILY AS NEEDED FOR FLUID OR EDEMA   [DISCONTINUED] diphenoxylate-atropine (LOMOTIL) 2.5-0.025 MG tablet Take 2 tablets by mouth 4 (four) times daily as needed for diarrhea or loose stools.   [DISCONTINUED] hydrocortisone (ANUSOL-HC) 2.5 % rectal cream Place 1 Application rectally 2 (two) times daily.   [DISCONTINUED] triamcinolone ointment (KENALOG) 0.5 % Apply 1 Application topically 2 (two) times daily.   No facility-administered medications prior to visit.    Review of Systems  Respiratory:  Positive for shortness of breath.  Cardiovascular:  Positive for chest pain (heavy sensation) and leg swelling.  Endocrine: Positive for polyuria.        Objective    BP 131/65 (BP Location: Right Arm, Patient Position: Sitting, Cuff Size: Normal)   Pulse 67   Resp 16   Ht 5\' 4"  (1.626 m)   Wt 189 lb 3.2 oz (85.8 kg)   SpO2 98%   BMI 32.48 kg/m     Physical Exam Constitutional:      Appearance: Normal appearance.  HENT:     Head: Normocephalic and atraumatic.  Eyes:      General: No scleral icterus.    Extraocular Movements: Extraocular movements intact.     Conjunctiva/sclera: Conjunctivae normal.  Cardiovascular:     Rate and Rhythm: Normal rate and regular rhythm.     Pulses: Normal pulses.     Heart sounds: Murmur heard.  Pulmonary:     Effort: Pulmonary effort is normal. No respiratory distress.     Breath sounds: Normal breath sounds.  Abdominal:     General: Bowel sounds are normal.  Musculoskeletal:     Right lower leg: No edema.     Left lower leg: No edema.  Skin:    General: Skin is warm and dry.  Neurological:     Mental Status: She is alert and oriented to person, place, and time. Mental status is at baseline.  Psychiatric:        Mood and Affect: Mood normal.        Behavior: Behavior normal.      No results found for any visits on 02/07/24.  Assessment & Plan    Hypertension associated with diabetes (HCC) Assessment & Plan: BP stable.  Continue amlodipine 5 mg daily, carvedilol 25 mg 2 times daily, hydrochlorothiazide 25 mg daily and lisinopril 40 mg daily.  Suboptimal diabetes management. Currently on metformin 1000 mg twice daily and glipizide. Previously used Ozempic with good results but discontinued due to cost. Concerned about uncontrolled blood sugar levels. Discussed resuming Ozempic due to better insurance coverage. Explained Ozempic's benefits for glycemic control and weight loss. Prefers Ozempic over Trulicity due to past positive experience. - Prescribe Ozempic (semaglutide) starting at 0.25 mg for four weeks, then increase to 0.5 mg - Order comprehensive metabolic panel, HbA1c, and lipid panel - Schedule annual eye exam to monitor for diabetic retinopathy - Recheck vitamin B12 levels due to long-term metformin use  Orders: -     Microalbumin / creatinine urine ratio -     Comprehensive metabolic panel -     Hemoglobin A1c -     Ozempic (0.25 or 0.5 MG/DOSE); Inject 0.25 mg into the skin once a week.  Dispense: 3  mL; Refill: 1  Bilateral lower extremity edema Assessment & Plan: Significant leg swelling, likely exacerbated by prolonged sitting due to work-from-home job. Using over-the-counter compression socks with some relief. Echocardiogram from July 2023 shows heart murmur and grade two diastolic dysfunction. Out of Lasix and has been avoiding remaining doses. - Recommend continued use of compression socks - Encourage more frequent movement and leg elevation during work hours - Prescribe Lasix (furosemide) as needed for edema  Orders: -     Furosemide; TAKE 1 TABLET BY MOUTH ONCE DAILY AS NEEDED FOR FLUID OR EDEMA  Dispense: 90 tablet; Refill: 1  Diabetic peripheral neuropathy associated with type 2 diabetes mellitus (HCC) Assessment & Plan: Noted.  No acute concerns.   Hyperlipidemia associated with type 2 diabetes mellitus (HCC) -  Lipid panel  Vitamin D deficiency -     VITAMIN D 25 Hydroxy (Vit-D Deficiency, Fractures)  Carcinoma of overlapping sites of left breast in female, estrogen receptor positive (HCC) Assessment & Plan: Underwent chemotherapy, currently on letrozole. Reports resolved neuropathy in feet and hands during chemotherapy. Recent blood blister on toe resolved after bursting. - Continue letrozole - Monitor for any new symptoms or recurrence Defer to specialist management (oncology)   Hypothyroidism, unspecified type Assessment & Plan: On levothyroxine. Thyroid function tests ordered to monitor current status. - Order thyroid function tests - Continue current dose of levothyroxine  Orders: -     TSH Rfx on Abnormal to Free T4  SVT (supraventricular tachycardia) (HCC) Assessment & Plan: Chronic, stable.  No acute concerns. Schedule follow-up with cardiology. Continue carvedilol 25 mg twice daily   High risk medication use -     Vitamin B12  Non-alcoholic cirrhosis (HCC) Assessment & Plan: Chronic.  Obesity noted with BMI 32 Continue diet low in  saturated fat Continue regular exercise 30 to 60 minutes at moderate intensity most days per week   Gastroesophageal reflux disease, unspecified whether esophagitis present -     Vitamin B12   General Health Maintenance - Encourage regular physical activity - Advise on proper hydration - Schedule follow-up with cardiology - Schedule follow-up in three months.   Return in about 3 months (around 05/06/2024) for DM, HTN, Pap.      I discussed the assessment and treatment plan with the patient  The patient was provided an opportunity to ask questions and all were answered. The patient agreed with the plan and demonstrated an understanding of the instructions.   The patient was advised to call back or seek an in-person evaluation if the symptoms worsen or if the condition fails to improve as anticipated.    Sherlyn Hay, DO  [facility] [phone] (phone) [phone] (fax)  [facility]

## 2024-02-07 NOTE — Assessment & Plan Note (Signed)
BP stable.  Continue amlodipine 5 mg daily, carvedilol 25 mg 2 times daily, hydrochlorothiazide 25 mg daily and lisinopril 40 mg daily.  Suboptimal diabetes management. Currently on metformin 1000 mg twice daily and glipizide. Previously used Ozempic with good results but discontinued due to cost. Concerned about uncontrolled blood sugar levels. Discussed resuming Ozempic due to better insurance coverage. Explained Ozempic's benefits for glycemic control and weight loss. Prefers Ozempic over Trulicity due to past positive experience. - Prescribe Ozempic (semaglutide) starting at 0.25 mg for four weeks, then increase to 0.5 mg - Order comprehensive metabolic panel, HbA1c, and lipid panel - Schedule annual eye exam to monitor for diabetic retinopathy - Recheck vitamin B12 levels due to long-term metformin use

## 2024-02-07 NOTE — Assessment & Plan Note (Signed)
Significant leg swelling, likely exacerbated by prolonged sitting due to work-from-home job. Using over-the-counter compression socks with some relief. Echocardiogram from July 2023 shows heart murmur and grade two diastolic dysfunction. Out of Lasix and has been avoiding remaining doses. - Recommend continued use of compression socks - Encourage more frequent movement and leg elevation during work hours - Prescribe Lasix (furosemide) as needed for edema

## 2024-02-07 NOTE — Assessment & Plan Note (Addendum)
Underwent chemotherapy, currently on letrozole. Reports resolved neuropathy in feet and hands during chemotherapy. Recent blood blister on toe resolved after bursting. - Continue letrozole - Monitor for any new symptoms or recurrence Defer to specialist management (oncology)

## 2024-02-07 NOTE — Assessment & Plan Note (Signed)
Chronic.  Obesity noted with BMI 32 Continue diet low in saturated fat Continue regular exercise 30 to 60 minutes at moderate intensity most days per week

## 2024-02-08 ENCOUNTER — Encounter: Payer: Self-pay | Admitting: Oncology

## 2024-02-09 ENCOUNTER — Encounter: Payer: Self-pay | Admitting: Oncology

## 2024-02-09 LAB — COMPREHENSIVE METABOLIC PANEL
ALT: 30 [IU]/L (ref 0–32)
AST: 26 [IU]/L (ref 0–40)
Albumin: 4.7 g/dL (ref 3.9–4.9)
Alkaline Phosphatase: 64 [IU]/L (ref 44–121)
BUN/Creatinine Ratio: 26 (ref 12–28)
BUN: 20 mg/dL (ref 8–27)
Bilirubin Total: 0.2 mg/dL (ref 0.0–1.2)
CO2: 25 mmol/L (ref 20–29)
Calcium: 9.6 mg/dL (ref 8.7–10.3)
Chloride: 101 mmol/L (ref 96–106)
Creatinine, Ser: 0.77 mg/dL (ref 0.57–1.00)
Globulin, Total: 2.1 g/dL (ref 1.5–4.5)
Glucose: 103 mg/dL — ABNORMAL HIGH (ref 70–99)
Potassium: 4.2 mmol/L (ref 3.5–5.2)
Sodium: 143 mmol/L (ref 134–144)
Total Protein: 6.8 g/dL (ref 6.0–8.5)
eGFR: 87 mL/min/{1.73_m2} (ref >59)

## 2024-02-09 LAB — HEMOGLOBIN A1C
Est. average glucose Bld gHb Est-mCnc: 203 mg/dL
Hgb A1c MFr Bld: 8.7 % — ABNORMAL HIGH (ref 4.8–5.6)

## 2024-02-09 LAB — LIPID PANEL
Chol/HDL Ratio: 4.8 {ratio} — ABNORMAL HIGH (ref 0.0–4.4)
Cholesterol, Total: 167 mg/dL (ref 100–199)
HDL: 35 mg/dL — ABNORMAL LOW (ref >39)
LDL Chol Calc (NIH): 84 mg/dL (ref 0–99)
Triglycerides: 289 mg/dL — ABNORMAL HIGH (ref 0–149)
VLDL Cholesterol Cal: 48 mg/dL — ABNORMAL HIGH (ref 5–40)

## 2024-02-09 LAB — MICROALBUMIN / CREATININE URINE RATIO
Creatinine, Urine: 81.4 mg/dL
Microalb/Creat Ratio: 268 mg/g{creat} — ABNORMAL HIGH (ref 0–29)
Microalbumin, Urine: 218 ug/mL

## 2024-02-09 LAB — VITAMIN B12: Vitamin B-12: 355 pg/mL (ref 232–1245)

## 2024-02-09 LAB — TSH RFX ON ABNORMAL TO FREE T4: TSH: 4.33 u[IU]/mL (ref 0.450–4.500)

## 2024-02-09 LAB — VITAMIN D 25 HYDROXY (VIT D DEFICIENCY, FRACTURES): Vit D, 25-Hydroxy: 51.4 ng/mL (ref 30.0–100.0)

## 2024-02-11 ENCOUNTER — Telehealth: Payer: Self-pay | Admitting: Family Medicine

## 2024-02-11 ENCOUNTER — Other Ambulatory Visit: Payer: Self-pay

## 2024-02-11 DIAGNOSIS — E1159 Type 2 diabetes mellitus with other circulatory complications: Secondary | ICD-10-CM

## 2024-02-11 DIAGNOSIS — I152 Hypertension secondary to endocrine disorders: Secondary | ICD-10-CM

## 2024-02-11 MED ORDER — GLIPIZIDE 5 MG PO TABS: 5.0000 mg | ORAL_TABLET | Freq: Two times a day (BID) | ORAL | 0 refills | Status: DC

## 2024-02-11 MED ORDER — AMLODIPINE BESYLATE 5 MG PO TABS: 5.0000 mg | ORAL_TABLET | Freq: Every day | ORAL | 0 refills | Status: DC

## 2024-02-11 MED ORDER — CARVEDILOL 25 MG PO TABS: 25.0000 mg | ORAL_TABLET | Freq: Two times a day (BID) | ORAL | 0 refills | Status: DC

## 2024-02-11 NOTE — Telephone Encounter (Signed)
 Requested prescriptions have been sent to pharmacy for patient.

## 2024-02-11 NOTE — Telephone Encounter (Signed)
 [facility] is requesting refill glipiZIDE (GLUCOTROL) 5 MG tablet   & carvedilol (COREG) 25 MG tablet  & amLODipine (NORVASC) 5 MG tablet  Please advise

## 2024-02-17 ENCOUNTER — Other Ambulatory Visit: Payer: Self-pay | Admitting: Family Medicine

## 2024-02-18 ENCOUNTER — Telehealth: Payer: Self-pay | Admitting: Family Medicine

## 2024-02-18 NOTE — Telephone Encounter (Signed)
 Unable to refill per protocol, appointment needed. Patient called, left VM to return the call to the office to scheduled an appt for medication refill request.    Requested Prescriptions  Pending Prescriptions Disp Refills   lisinopril (ZESTRIL) 40 MG tablet [Pharmacy Med Name: Lisinopril 40 MG Oral Tablet] 30 tablet 0    Sig: TAKE 1 TABLET BY MOUTH ONCE DAILY . APPOINTMENT REQUIRED FOR FUTURE REFILLS     Cardiovascular:  ACE Inhibitors Failed - 02/18/2024  1:18 PM      Failed - Valid encounter within last 6 months    Recent Outpatient Visits           9 months ago Annual physical exam   [facility] Merita Norton T, [name]   1 year ago Type 2 diabetes mellitus with hyperglycemia, without long-term current use of insulin [facility])   [place] [facility] Merita Norton T, [name]   1 year ago Type 2 diabetes mellitus with hyperglycemia, without long-term current use of insulin [facility])   [place] [facility] Merita Norton T, [name]   1 year ago Type 2 diabetes mellitus with hyperglycemia, without long-term current use of insulin [facility])   [place] [facility] Merita Norton T, [name]   2 years ago Type 2 diabetes mellitus without complication, without long-term current use of insulin [facility])    [facility] Jacky Kindle, [name]       Future Appointments             In 4 months Finnegan, Tollie Pizza, MD [facility] Of [place]            Passed - Cr in normal range and within 180 days    Creatinine  Date Value Ref Range Status  06/14/2023 0.83 0.44 - 1.00 mg/dL Final   Creatinine, Ser  Date Value Ref Range Status  02/07/2024 0.77 0.57 - 1.00 mg/dL Final   Creatinine, POC  Date Value Ref Range Status  02/24/2016 NA mg/dL Final         Passed - K in normal range and within 180 days    Potassium  Date Value Ref Range Status  02/07/2024 4.2 3.5 - 5.2  mmol/L Final         Passed - Patient is not pregnant      Passed - Last BP in normal range    BP Readings from Last 1 Encounters:  02/07/24 131/65

## 2024-02-18 NOTE — Telephone Encounter (Signed)
[facility] faxed refill request for the following medications:   metFORMIN (GLUCOPHAGE) 1000 MG tablet    Please advise.  

## 2024-02-19 ENCOUNTER — Other Ambulatory Visit: Payer: Self-pay

## 2024-02-19 DIAGNOSIS — E1142 Type 2 diabetes mellitus with diabetic polyneuropathy: Secondary | ICD-10-CM

## 2024-02-19 MED ORDER — METFORMIN HCL 1000 MG PO TABS
1000.0000 mg | ORAL_TABLET | Freq: Two times a day (BID) | ORAL | 3 refills | Status: AC
Start: 2024-02-19 — End: ?

## 2024-02-21 ENCOUNTER — Encounter: Payer: Self-pay | Admitting: Family Medicine

## 2024-02-21 ENCOUNTER — Other Ambulatory Visit: Payer: Self-pay

## 2024-02-21 NOTE — Telephone Encounter (Signed)
 Copied from CRM [id number]. Topic: General - Other >> Feb 21, 2024 12:57 PM Emylou G wrote: Reason for CRM: Patient returned call ( at lunch ) unsure why has to be seen.. she had an appt last month?  In regards to needing refill lisinopril (ZESTRIL) 40 MG tablet [Pharmacy Med Name: Lisinopril 40 MG Oral Tablet] 30 Pls call patient back for clarity.

## 2024-02-22 ENCOUNTER — Other Ambulatory Visit: Payer: Self-pay | Admitting: Physician Assistant

## 2024-02-24 NOTE — Telephone Encounter (Signed)
 Requested medication (s) are due for refill today: yes  Requested medication (s) are on the active medication list: yes  Last refill:  01/21/24  Future visit scheduled: no  Notes to clinic:  Unable to refill per protocol, courtesy refill already given, routing for provider approval.      Requested Prescriptions  Pending Prescriptions Disp Refills   lisinopril (ZESTRIL) 40 MG tablet [Pharmacy Med Name: Lisinopril 40 MG Oral Tablet] 30 tablet 0    Sig: TAKE 1 TABLET BY MOUTH ONCE DAILY . APPOINTMENT REQUIRED FOR FUTURE REFILLS     Cardiovascular:  ACE Inhibitors Failed - 02/24/2024  2:55 PM      Failed - Valid encounter within last 6 months    Recent Outpatient Visits           9 months ago Annual physical exam   [facility] T, [name]   1 year ago Type 2 diabetes mellitus with hyperglycemia, without long-term current use of insulin [facility])   [place] [facility] Merita Norton T, [name]   1 year ago Type 2 diabetes mellitus with hyperglycemia, without long-term current use of insulin [facility])   [place] [facility] Merita Norton T, [name]   1 year ago Type 2 diabetes mellitus with hyperglycemia, without long-term current use of insulin [facility])   [place] [facility] Merita Norton T, [name]   2 years ago Type 2 diabetes mellitus without complication, without long-term current use of insulin [facility])   [place] [facility] Jacky Kindle, [name]       Future Appointments             In 4 months Finnegan, Tollie Pizza, MD [facility] [place]            Passed - Cr in normal range and within 180 days    Creatinine  Date Value Ref Range Status  06/14/2023 0.83 0.44 - 1.00 mg/dL Final   Creatinine, Ser  Date Value Ref Range Status  02/07/2024 0.77 0.57 - 1.00 mg/dL Final   Creatinine, POC  Date Value Ref Range Status  02/24/2016 NA mg/dL Final          Passed - K in normal range and within 180 days    Potassium  Date Value Ref Range Status  02/07/2024 4.2 3.5 - 5.2 mmol/L Final         Passed - Patient is not pregnant      Passed - Last BP in normal range    BP Readings from Last 1 Encounters:  02/07/24 131/65

## 2024-02-24 NOTE — Telephone Encounter (Signed)
[facility] refill request for the following medications:  lisinopril (ZESTRIL) 40 MG tablet   Please advise.

## 2024-02-25 MED ORDER — LISINOPRIL 40 MG PO TABS: 40.0000 mg | ORAL_TABLET | Freq: Every day | ORAL | 1 refills | Status: DC

## 2024-03-06 ENCOUNTER — Other Ambulatory Visit: Payer: Self-pay | Admitting: Family Medicine

## 2024-03-06 ENCOUNTER — Telehealth: Payer: Self-pay | Admitting: Family Medicine

## 2024-03-06 MED ORDER — HYDROCHLOROTHIAZIDE 25 MG PO TABS: 25.0000 mg | ORAL_TABLET | Freq: Every day | ORAL | 0 refills | Status: DC

## 2024-03-06 NOTE — Telephone Encounter (Signed)
 Copied from CRM [id number]. Topic: Clinical - Medication Refill >> Mar 06, 2024 10:59 AM Higinio Roger wrote: Most Recent Primary Care Visit:  Provider: Sherlyn Hay  Department[facility]  Visit Type: OFFICE VISIT  Date: 02/07/2024  Medication: hydrochlorothiazide (HYDRODIURIL) 25 MG tablet   Has the patient contacted their pharmacy? Yes (Agent: If no, request that the patient contact the pharmacy for the refill. If patient does not wish to contact the pharmacy document the reason why and proceed with request.) (Agent: If yes, when and what did the pharmacy advise?)  Is this the correct pharmacy for this prescription? Yes If no, delete pharmacy and type the correct one.  This is the patient's preferred pharmacy:   [facility] [address] ([address]), [place] - [address] [place]) [place] [address] Phone: [phone] Fax: [phone]   Has the prescription been filled recently? Yes  Is the patient out of the medication? Yes  Has the patient been seen for an appointment in the last year OR does the patient have an upcoming appointment? Yes  Can we respond through MyChart? Yes  Agent: Please be advised that Rx refills may take up to 3 business days. We ask that you follow-up with your pharmacy.

## 2024-03-06 NOTE — Telephone Encounter (Signed)
 [facility] is requesting refill hydrochlorothiazide (HYDRODIURIL) 25 MG tablet   Please advise

## 2024-03-09 ENCOUNTER — Telehealth: Payer: Self-pay

## 2024-03-09 NOTE — Telephone Encounter (Signed)
 Patient was identified as falling into the [place] Measure - Diabetes.   Patient was: Patient seen on 02/07/24, A1C down from 9.3 to 8.7. Patient to follow up in 3 months

## 2024-03-20 ENCOUNTER — Encounter: Payer: Self-pay | Admitting: Internal Medicine

## 2024-03-20 ENCOUNTER — Ambulatory Visit: Admitting: Internal Medicine

## 2024-03-20 VITALS — BP 138/72 | HR 69 | Ht 64.0 in | Wt 189.2 lb

## 2024-03-20 DIAGNOSIS — J301 Allergic rhinitis due to pollen: Secondary | ICD-10-CM

## 2024-03-20 NOTE — Patient Instructions (Signed)
 Allergic Rhinitis, Adult  Allergic rhinitis is a reaction to allergens. Allergens are things that can cause an allergic reaction. This condition affects the lining inside the nose (mucous membrane). There are two types of allergic rhinitis: Seasonal. This type is also called hay fever. It happens only during some times of the year. Perennial. This type can happen at any time of the year. This condition cannot be spread from person to person (is not contagious). It can be mild, bad, or very bad. It can develop at any age and may be outgrown. What are the causes? Pollen from grasses, trees, and weeds. Other causes can be: Dust mites. Smoke. Mold. Car fumes. The pee (urine), spit, or dander of pets. Dander is dead skin cells from a pet. What increases the risk? You are more likely to develop this condition if: You have allergies in your family. You have problems like allergies in your family. You may have: Swelling of parts of your eyes and eyelids. Asthma. This affects how you breathe. Long-term redness and swelling on your skin. Food allergies. What are the signs or symptoms? The main symptom of this condition is a runny or stuffy nose (nasal congestion). Other symptoms may include: Sneezing or coughing. Itching and tearing of your eyes. Mucus that drips down the back of your throat (postnasal drip). This may cause a sore throat. Trouble sleeping. Feeling tired. Headache. How is this treated? There is no cure for this condition. You should avoid things that you are allergic to. Treatment can help to relieve symptoms. This may include: Medicines that block allergy symptoms, such as anti-inflammatories or antihistamines. These may be given as a shot, nasal spray, or pill. Avoiding things you are allergic to. Medicines that give you some of what you are allergic to over time. This is called immunotherapy. It is done if other treatments do not help. You may get: Shots. Medicine under  your tongue. Stronger medicines, if other treatments do not help. Follow these instructions at home: Avoiding allergens Find out what things you are allergic to and avoid them. To do this, try these things: If you get allergies any time of year: Replace carpet with wood, tile, or vinyl flooring. Carpet can trap pet dander and dust. Do not smoke. Do not allow smoking in your home. Change your heating and air conditioning filters at least once a month. If you get allergies only some times of the year: Keep windows closed when you can. Plan things to do outside when pollen counts are lowest. Check pollen counts before you plan things to do outside. When you come indoors, change your clothes and shower before you sit on furniture or bedding. If you are allergic to a pet: Keep the pet out of your bedroom. Vacuum, sweep, and dust often. General instructions Take over-the-counter and prescription medicines only as told by your doctor. Drink enough fluid to keep your pee pale yellow. Where to find more information American Academy of Allergy, Asthma & Immunology: aaaai.org Contact a doctor if: You have a fever. You get a cough that does not go away. You make high-pitched whistling sounds when you breathe, most often when you breathe out (wheeze). Your symptoms slow you down. Your symptoms stop you from doing your normal things each day. Get help right away if: You are short of breath. This symptom may be an emergency. Get help right away. Call 911. Do not wait to see if the symptom will go away. Do not drive yourself to the  hospital. This information is not intended to replace advice given to you by your health care provider. Make sure you discuss any questions you have with your health care provider. Document Revised: 08/13/2022 Document Reviewed: 08/13/2022 Elsevier Patient Education  2024 [employer].

## 2024-03-20 NOTE — Progress Notes (Signed)
 Subjective:    Patient ID: Emily Tapia, female    DOB: 1961/06/07, 63 y.o.   MRN: [id number]  HPI  Discussed the use of AI scribe software for clinical note transcription with the patient, who gave verbal consent to proceed.   Emily Tapia is a 63 year old female who presents with sinus pressure, runny nose, and cough.  She has been experiencing sinus pressure and a runny nose for the past two to three days. The sinus pressure is mild, and the runny nose began today. She also has a slight headache accompanying these symptoms. No fever, chills, or body aches are present.  She describes left ear pain that started today and a sore throat that occurred about a week ago, which has mostly resolved but persists slightly. No nausea, vomiting, or diarrhea.  She has been coughing with occasional production of sputum, though she has not observed its color. No shortness of breath is noted.  Her mother-in-law, who resides in a memory care unit, has had pneumonia and RSV, but she has not been in close contact with her. She has not been diagnosed with allergies before, but her brother developed them in his fifties.  She has been taking NyQuil at night to manage her symptoms. She also has a history of a heart murmur and breast cancer.       Review of Systems   Past Medical History:  Diagnosis Date   Anxiety    Aortic stenosis    Cirrhosis (HCC)    Diabetes mellitus    Edema    Heart murmur    Hypertension    Sleep apnea    Thyroid disease     Current Outpatient Medications  Medication Sig Dispense Refill   amLODipine (NORVASC) 5 MG tablet Take 1 tablet (5 mg total) by mouth daily. 90 tablet 0   Ascorbic Acid (VITAMIN C PO) Take 1 tablet by mouth daily.     aspirin 81 MG EC tablet Take 81 mg by mouth at bedtime.     carvedilol (COREG) 25 MG tablet Take 1 tablet (25 mg total) by mouth 2 (two) times daily. 180 tablet 0   Cholecalciferol (VITAMIN D3) 1000 UNITS CAPS Take  1,000 Units by mouth at bedtime.     furosemide (LASIX) 40 MG tablet TAKE 1 TABLET BY MOUTH ONCE DAILY AS NEEDED FOR FLUID OR EDEMA 90 tablet 1   glipiZIDE (GLUCOTROL) 5 MG tablet Take 1 tablet (5 mg total) by mouth 2 (two) times daily before a meal. 180 tablet 0   glucose blood (CONTOUR NEXT TEST) test strip Test fasting sugar each morning. Recheck if having hypoglycemic symptoms. 100 each 4   hydrochlorothiazide (HYDRODIURIL) 25 MG tablet Take 1 tablet (25 mg total) by mouth daily. 90 tablet 0   letrozole (FEMARA) 2.5 MG tablet Take 1 tablet by mouth once daily 90 tablet 0   levothyroxine (SYNTHROID) 75 MCG tablet TAKE 1 TABLET BY MOUTH ONCE DAILY BEFORE BREAKFAST 90 tablet 2   lisinopril (ZESTRIL) 40 MG tablet Take 1 tablet (40 mg total) by mouth daily. 90 tablet 1   metFORMIN (GLUCOPHAGE) 1000 MG tablet Take 1 tablet (1,000 mg total) by mouth 2 (two) times daily with a meal. 180 tablet 3   Multiple Vitamin (MULTIVITAMIN) tablet Take 1 tablet by mouth every morning.     PARoxetine (PAXIL) 20 MG tablet Take 1 tablet by mouth once daily 90 tablet 1   Semaglutide,0.25 or 0.5MG /DOS, (OZEMPIC, 0.25 OR 0.5  MG/DOSE,) 2 MG/3ML SOPN Inject 0.25 mg into the skin once a week. 3 mL 1   zinc gluconate 50 MG tablet Take 50 mg by mouth daily.     No current facility-administered medications for this visit.    Allergies  Allergen Reactions   Fosaprepitant Nausea Only and Other (See Comments)    Back pain, nausea. See progress note from 10/27/2021     Family History  Problem Relation Age of Onset   Hyperlipidemia Mother    Thyroid disease Mother    Hashimoto's thyroiditis Mother    Heart disease Father        open heart surgery   Heart failure Father    Diabetes Father    Dementia Father    Thyroid disease Brother    Breast cancer Maternal Grandmother    Diabetes Paternal Grandmother    Prostate cancer Paternal Grandfather     Social History   Socioeconomic History   Marital status:  Married    Spouse name: Sherilyn Cooter   Number of children: 2   Years of education: 15   Highest education level: Associate degree: occupational, Scientist, product/process development, or vocational program  Occupational History   Occupation: [employer]  Tobacco Use   Smoking status: Former    Current packs/day: 0.00    Average packs/day: 1.5 packs/day for 25.0 years (37.5 ttl pk-yrs)    Types: Cigarettes    Start date: 11/24/1976    Quit date: 11/24/2001    Years since quitting: 22.3   Smokeless tobacco: Never  Vaping Use   Vaping status: Never Used  Substance and Sexual Activity   Alcohol use: Yes    Alcohol/week: 0.0 standard drinks of alcohol    Comment: OCCASIONALLY DRINKS WINE, ONCE A WEEK   Drug use: No   Sexual activity: Yes  Other Topics Concern   Not on file  Social History Narrative   Not on file   Social Drivers of Health   Financial Resource Strain: Low Risk  (02/07/2024)   Overall Financial Resource Strain (CARDIA)    Difficulty of Paying Living Expenses: Not very hard  Food Insecurity: No Food Insecurity (02/07/2024)   Hunger Vital Sign    Worried About Running Out of Food in the Last Year: Never true    Ran Out of Food in the Last Year: Never true  Transportation Needs: No Transportation Needs (02/04/2024)   PRAPARE - Administrator, Civil Service (Medical): No    Lack of Transportation (Non-Medical): No  Physical Activity: Insufficiently Active (02/07/2024)   Exercise Vital Sign    Days of Exercise per Week: 1 day    Minutes of Exercise per Session: 20 min  Stress: No Stress Concern Present (02/07/2024)   [employer] of Occupational Health - Occupational Stress Questionnaire    Feeling of Stress : Only a little  Social Connections: Moderately Isolated (02/04/2024)   Social Connection and Isolation Panel [NHANES]    Frequency of Communication with Friends and Family: Once a week    Frequency of Social Gatherings with Friends and Family: Once a week    Attends Religious  Services: More than 4 times per year    Active Member of [employer] or Organizations: No    Attends Banker Meetings: Not on file    Marital Status: Married  Intimate Partner Violence: Not At Risk (02/07/2024)   Humiliation, Afraid, Rape, and Kick questionnaire    Fear of Current or Ex-Partner: No    Emotionally Abused: No  Physically Abused: No    Sexually Abused: No     Constitutional: Patient reports fatigue, headache.  Denies fever, malaise, or abrupt weight changes.  HEENT: Patient reports sinus pressure, runny nose, left ear pain and sore throat.  Denies eye pain, eye redness,  ringing in the ears, wax buildup, nasal congestion, bloody nose. Respiratory: Patient reports cough.  Denies difficulty breathing, shortness of breath, or sputum production.   Cardiovascular: Denies chest pain, chest tightness, palpitations or swelling in the hands or feet.  Gastrointestinal: Denies abdominal pain, bloating, constipation, diarrhea or blood in the stool.  Musculoskeletal: Denies decrease in range of motion, difficulty with gait, muscle pain or joint pain and swelling.   No other specific complaints in a complete review of systems (except as listed in HPI above).      Objective:   Physical Exam  BP 138/72 (BP Location: Left Arm, Patient Position: Sitting, Cuff Size: Normal)   Pulse 69   Ht 5\' 4"  (1.626 m)   Wt 189 lb 3.2 oz (85.8 kg)   SpO2 94%   BMI 32.48 kg/m   Wt Readings from Last 3 Encounters:  02/07/24 189 lb 3.2 oz (85.8 kg)  01/02/24 186 lb 12.8 oz (84.7 kg)  12/27/23 187 lb (84.8 kg)    General: Appears her stated age, obese, in NAD. HEENT: Head: normal shape and size, no sinus tenderness noted; Eyes: sclera white, no icterus, conjunctiva pink, PERRLA and EOMs intact; Ears: Cerumen faction nose: mucosa boggy and moist, septum midline, turbinates swollen; Throat/Mouth: Teeth present, mucosa pink and moist, + PND, no exudate, lesions or ulcerations noted.  Neck:  No adenopathy noted. Cardiovascular: Normal rate and rhythm. S1,S2 noted.  Murmur noted.  Pulmonary/Chest: Normal effort and positive vesicular breath sounds. No respiratory distress. No wheezes, rales or ronchi noted.  Neurological: Alert and oriented.   BMET    Component Value Date/Time   NA 143 02/07/2024 1616   K 4.2 02/07/2024 1616   CL 101 02/07/2024 1616   CO2 25 02/07/2024 1616   GLUCOSE 103 (H) 02/07/2024 1616   GLUCOSE 237 (H) 06/14/2023 1008   BUN 20 02/07/2024 1616   CREATININE 0.77 02/07/2024 1616   CREATININE 0.83 06/14/2023 1008   CALCIUM 9.6 02/07/2024 1616   GFRNONAA >60 06/14/2023 1008   GFRAA 111 10/14/2020 1009    Lipid Panel     Component Value Date/Time   CHOL 167 02/07/2024 1616   TRIG 289 (H) 02/07/2024 1616   HDL 35 (L) 02/07/2024 1616   CHOLHDL 4.8 (H) 02/07/2024 1616   LDLCALC 84 02/07/2024 1616    CBC    Component Value Date/Time   WBC 4.7 06/14/2023 1008   WBC 5.9 05/15/2023 1502   RBC 4.41 06/14/2023 1008   HGB 10.9 (L) 06/14/2023 1008   HGB 10.7 (L) 05/03/2023 1504   HCT 35.1 (L) 06/14/2023 1008   HCT 34.8 05/03/2023 1504   PLT 96 (L) 06/14/2023 1008   PLT 108 (L) 05/03/2023 1504   MCV 79.6 (L) 06/14/2023 1008   MCV 79 05/03/2023 1504   MCH 24.7 (L) 06/14/2023 1008   MCHC 31.1 06/14/2023 1008   RDW 15.8 (H) 06/14/2023 1008   RDW 15.1 05/03/2023 1504   LYMPHSABS 1.4 06/14/2023 1008   LYMPHSABS 2.0 01/11/2023 1354   MONOABS 0.2 06/14/2023 1008   EOSABS 0.1 06/14/2023 1008   EOSABS 0.2 01/11/2023 1354   BASOSABS 0.0 06/14/2023 1008   BASOSABS 0.0 01/11/2023 1354    Hgb A1C Lab  Results  Component Value Date   HGBA1C 8.7 (H) 02/07/2024            Assessment & Plan:   Assessment and Plan    Allergic Rhinitis Symptoms consistent with allergic rhinitis likely due to seasonal pollen. No sinusitis but potential for progression. Steroid injections contraindicated due to diabetes. - Fluticasone twice daily for three days,  then once daily. - Antihistamine at bedtime: Loratadine, Fexofenadine, or Cetirizine. - Monitor for fever, severe facial pain, or discolored mucus.    Follow-up with your PCP as previously scheduled Nicki Reaper, NP

## 2024-04-11 ENCOUNTER — Other Ambulatory Visit: Payer: Self-pay | Admitting: Oncology

## 2024-04-13 ENCOUNTER — Encounter: Payer: Self-pay | Admitting: Oncology

## 2024-05-09 ENCOUNTER — Other Ambulatory Visit: Payer: Self-pay | Admitting: Family Medicine

## 2024-05-09 DIAGNOSIS — E1159 Type 2 diabetes mellitus with other circulatory complications: Secondary | ICD-10-CM

## 2024-05-24 ENCOUNTER — Other Ambulatory Visit: Payer: Self-pay | Admitting: Family Medicine

## 2024-06-13 ENCOUNTER — Other Ambulatory Visit: Payer: Self-pay

## 2024-06-24 ENCOUNTER — Telehealth: Payer: Self-pay | Admitting: Family Medicine

## 2024-06-24 ENCOUNTER — Other Ambulatory Visit: Payer: Self-pay

## 2024-06-24 MED ORDER — LEVOTHYROXINE SODIUM 75 MCG PO TABS
ORAL_TABLET | ORAL | 2 refills | Status: DC
Start: 2024-06-24 — End: 2024-11-10

## 2024-06-24 NOTE — Telephone Encounter (Signed)
Rx request sent in 

## 2024-06-24 NOTE — Telephone Encounter (Signed)
[facility] faxed refill request for the following medications:    levothyroxine (SYNTHROID) 75 MCG tablet   Please advise

## 2024-07-03 ENCOUNTER — Ambulatory Visit
Admission: RE | Admit: 2024-07-03 | Discharge: 2024-07-03 | Disposition: A | Discharge status: Home / Self Care | Source: Ambulatory Visit | Attending: Oncology | Admitting: Oncology

## 2024-07-03 ENCOUNTER — Other Ambulatory Visit: Payer: Self-pay | Admitting: Oncology

## 2024-07-03 DIAGNOSIS — Z170 Estrogen receptor positive status [ER+]: Secondary | ICD-10-CM | POA: Insufficient documentation

## 2024-07-03 DIAGNOSIS — N644 Mastodynia: Secondary | ICD-10-CM

## 2024-07-03 DIAGNOSIS — N6315 Unspecified lump in the right breast, overlapping quadrants: Secondary | ICD-10-CM

## 2024-07-03 DIAGNOSIS — C50212 Malignant neoplasm of upper-inner quadrant of left female breast: Secondary | ICD-10-CM | POA: Insufficient documentation

## 2024-07-06 ENCOUNTER — Telehealth: Payer: No Typology Code available for payment source | Admitting: Oncology

## 2024-07-10 ENCOUNTER — Ambulatory Visit
Admission: RE | Admit: 2024-07-10 | Discharge: 2024-07-10 | Disposition: A | Discharge status: Home / Self Care | Source: Ambulatory Visit | Attending: Oncology | Admitting: Oncology

## 2024-07-10 DIAGNOSIS — N6315 Unspecified lump in the right breast, overlapping quadrants: Secondary | ICD-10-CM

## 2024-07-10 DIAGNOSIS — N644 Mastodynia: Secondary | ICD-10-CM | POA: Diagnosis present

## 2024-07-16 ENCOUNTER — Inpatient Hospital Stay: Attending: Oncology | Admitting: Oncology

## 2024-07-17 ENCOUNTER — Encounter: Payer: Self-pay | Admitting: Oncology

## 2024-07-17 NOTE — Progress Notes (Signed)
 This encounter was created in error - please disregard.

## 2024-07-18 ENCOUNTER — Other Ambulatory Visit: Payer: Self-pay

## 2024-07-20 ENCOUNTER — Telehealth: Payer: Self-pay | Admitting: Family Medicine

## 2024-07-20 ENCOUNTER — Other Ambulatory Visit: Payer: Self-pay

## 2024-07-20 ENCOUNTER — Telehealth: Admitting: Oncology

## 2024-07-20 DIAGNOSIS — F411 Generalized anxiety disorder: Secondary | ICD-10-CM

## 2024-07-20 MED ORDER — PAROXETINE HCL 20 MG PO TABS: 20.0000 mg | ORAL_TABLET | Freq: Every day | ORAL | 0 refills | Status: DC

## 2024-07-20 NOTE — Telephone Encounter (Signed)
[facility] faxed refill request for the following medications:  PARoxetine (PAXIL) 20 MG tablet   Please advise.  

## 2024-07-20 NOTE — Telephone Encounter (Signed)
Converted to refill request 

## 2024-07-23 ENCOUNTER — Inpatient Hospital Stay: Attending: Oncology | Admitting: Oncology

## 2024-07-23 DIAGNOSIS — C50212 Malignant neoplasm of upper-inner quadrant of left female breast: Secondary | ICD-10-CM | POA: Diagnosis not present

## 2024-07-23 DIAGNOSIS — Z170 Estrogen receptor positive status [ER+]: Secondary | ICD-10-CM | POA: Diagnosis not present

## 2024-07-23 NOTE — Progress Notes (Unsigned)
 [facility]  [phone] [phone]) [phone]  ID: Carolyne Whitsel OB: July 13, 1961  MR#: [id number]  RDW#:[id number]  Patient Care Team: Donzella Lauraine SAILOR, DO as PCP - General (Family Medicine) Darliss Rogue, MD as PCP - Cardiology (Cardiology) Lane Shope, MD as Consulting Physician (General Surgery) Jacobo Evalene PARAS, MD as Consulting Physician (Oncology)  I connected with Sharlet Beecher Haddock on 07/24/24 at  3:30 PM EDT by video enabled telemedicine visit and verified that I am speaking with the correct person using two identifiers.   I discussed the limitations, risks, security and privacy concerns of performing an evaluation and management service by telemedicine and the availability of in-person appointments. I also discussed with the patient that there may be a patient responsible charge related to this service. The patient expressed understanding and agreed to proceed.   Other persons participating in the visit and their role in the encounter: Patient, MD.  Patient's location: Home. Provider's location: Clinic.  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    INTERVAL HISTORY: Patient agreed to video-assisted telemedicine visit for her routine 64-month evaluation.  She currently feels well and is asymptomatic.  She continues to tolerate letrozole  without significant side effects. She has no neurologic complaints.  She denies any recent fevers or illnesses.  She has a good appetite and denies weight loss.  She has no chest pain, shortness of breath, cough, or hemoptysis.  She denies any nausea, vomiting, constipation, or diarrhea.  She has no urinary complaints.  Patient offers no specific complaints today.  REVIEW OF SYSTEMS:   Review of Systems  Constitutional: Negative.  Negative for fever, malaise/fatigue and weight loss.  Respiratory: Negative.  Negative for cough and shortness of breath.    Cardiovascular: Negative.  Negative for chest pain and leg swelling.  Gastrointestinal: Negative.  Negative for abdominal pain and diarrhea.  Genitourinary: Negative.  Negative for dysuria.  Musculoskeletal: Negative.  Negative for back pain.  Skin: Negative.  Negative for rash.  Neurological: Negative.  Negative for dizziness, focal weakness, weakness and headaches.  Psychiatric/Behavioral: Negative.  The patient is not nervous/anxious.     As per HPI. Otherwise, a complete review of systems is negative.  PAST MEDICAL HISTORY: Past Medical History:  Diagnosis Date   Anxiety    Aortic stenosis    Cirrhosis (HCC)    Diabetes mellitus    Edema    Heart murmur    Hypertension    Sleep apnea    Thyroid  disease     PAST SURGICAL HISTORY: Past Surgical History:  Procedure Laterality Date   ABDOMINAL HYSTERECTOMY  11/17/2013   BREAST BIOPSY     x2   BREAST BIOPSY Left 07/20/2021   US  Bx, Ribbon Clip, [facility]   BREAST BIOPSY Left 07/20/2021   Stereo Bx, X-clip, DCIS   BREAST BIOPSY Left 07/20/2021   Stereo biopsy, coil clip, DCIS   BREAST BIOPSY Right 07/20/2021   Stereo Bx, Ribbon Clip, Fat necrosis   CESAREAN SECTION     x 2   CHOLECYSTECTOMY  12/17/2001   DUE TO STONES   COLONOSCOPY WITH PROPOFOL  N/A 07/26/2022   Procedure: COLONOSCOPY WITH PROPOFOL ;  Surgeon: Unk Corinn Skiff, MD;  Location: [facility] END[facility];  Service: Gastroenterology;  Laterality: N/A;   DILATION AND CURETTAGE OF UTERUS     ELBOW SURGERY     ESOPHAGOGASTRODUODENOSCOPY (EGD) WITH PROPOFOL  N/A 07/26/2022   Procedure: ESOPHAGOGASTRODUODENOSCOPY (EGD) WITH PROPOFOL ;  Surgeon: Unk Corinn Skiff, MD;  Location: [facility] ENDOSCOPY;  Service: Gastroenterology;  Laterality: N/A;   POLYPECTOMY  12/17/1993   PORTACATH PLACEMENT Right 10/25/2021   Procedure: INSERTION PORT-A-CATH;  Surgeon: Lane Shope, MD;  Location: [facility] ORS;  Service: General;  Laterality: Right;   SIMPLE MASTECTOMY WITH AXILLARY SENTINEL  NODE BIOPSY Left 08/16/2021   Procedure: SIMPLE MASTECTOMY WITH AXILLARY SENTINEL NODE BIOPSY;  Surgeon: Lane Shope, MD;  Location: [facility] ORS;  Service: General;  Laterality: Left;    FAMILY HISTORY: Family History  Problem Relation Age of Onset   Hyperlipidemia Mother    Thyroid  disease Mother    Hashimoto's thyroiditis Mother    Heart disease Father        open heart surgery   Heart failure Father    Diabetes Father    Dementia Father    Thyroid  disease Brother    Breast cancer Maternal Grandmother    Diabetes Paternal Grandmother    Prostate cancer Paternal Grandfather     ADVANCED DIRECTIVES (Y/N):  N  HEALTH MAINTENANCE: Social History   Tobacco Use   Smoking status: Former    Current packs/day: 0.00    Average packs/day: 1.5 packs/day for 25.0 years (37.5 ttl pk-yrs)    Types: Cigarettes    Start date: 11/24/1976    Quit date: 11/24/2001    Years since quitting: 22.6   Smokeless tobacco: Never  Vaping Use   Vaping status: Never Used  Substance Use Topics   Alcohol use: Yes    Alcohol/week: 0.0 standard drinks of alcohol    Comment: OCCASIONALLY DRINKS WINE, ONCE A WEEK   Drug use: No     Colonoscopy:  PAP:  Bone density:  Lipid panel:  Allergies  Allergen Reactions   Fosaprepitant  Nausea Only and Other (See Comments)    Back pain, nausea. See progress note from 10/27/2021     Current Outpatient Medications  Medication Sig Dispense Refill   amLODipine  (NORVASC ) 5 MG tablet Take 1 tablet by mouth once daily 90 tablet 0   Ascorbic Acid (VITAMIN C PO) Take 1 tablet by mouth daily.     aspirin 81 MG EC tablet Take 81 mg by mouth at bedtime.     carvedilol  (COREG ) 25 MG tablet Take 1 tablet by mouth twice daily 180 tablet 0   Cholecalciferol (VITAMIN D3) 1000 UNITS CAPS Take 1,000 Units by mouth at bedtime.     furosemide  (LASIX ) 40 MG tablet TAKE 1 TABLET BY MOUTH ONCE DAILY AS NEEDED FOR FLUID OR EDEMA 90 tablet 1   glipiZIDE  (GLUCOTROL ) 5 MG tablet  TAKE 1 TABLET BY MOUTH TWICE DAILY BEFORE A MEAL 180 tablet 0   glucose blood (CONTOUR NEXT TEST) test strip Test fasting sugar each morning. Recheck if having hypoglycemic symptoms. 100 each 4   hydrochlorothiazide  (HYDRODIURIL ) 25 MG tablet Take 1 tablet by mouth once daily 90 tablet 0   letrozole  (FEMARA ) 2.5 MG tablet Take 1 tablet by mouth once daily 90 tablet 1   levothyroxine  (SYNTHROID ) 75 MCG tablet TAKE 1 TABLET BY MOUTH ONCE DAILY BEFORE BREAKFAST . 90 tablet 2   lisinopril  (ZESTRIL ) 40 MG tablet Take 1 tablet (40 mg total) by mouth daily. 90 tablet 1   metFORMIN  (GLUCOPHAGE ) 1000 MG tablet Take 1 tablet (1,000 mg total) by mouth 2 (two) times daily with a meal. 180 tablet 3   Multiple Vitamin (MULTIVITAMIN) tablet Take 1 tablet by mouth every morning.     PARoxetine  (PAXIL ) 20 MG tablet Take 1 tablet (20 mg total)  by mouth daily. 90 tablet 0   Semaglutide ,0.25 or 0.5MG /DOS, (OZEMPIC , 0.25 OR 0.5 MG/DOSE,) 2 MG/3ML SOPN Inject 0.25 mg into the skin once a week. (Patient not taking: Reported on 07/16/2024) 3 mL 1   zinc gluconate 50 MG tablet Take 50 mg by mouth daily.     No current facility-administered medications for this visit.    OBJECTIVE: There were no vitals filed for this visit.    There is no height or weight on file to calculate BMI.    ECOG FS:0 - Asymptomatic  General: Well-developed, well-nourished, no acute distress. HEENT: Normocephalic. Neuro: Alert, answering all questions appropriately. Cranial nerves grossly intact. Psych: Normal affect.  LAB RESULTS:  Lab Results  Component Value Date   NA 143 02/07/2024   K 4.2 02/07/2024   CL 101 02/07/2024   CO2 25 02/07/2024   GLUCOSE 103 (H) 02/07/2024   BUN 20 02/07/2024   CREATININE 0.77 02/07/2024   CALCIUM  9.6 02/07/2024   PROT 6.8 02/07/2024   ALBUMIN 4.7 02/07/2024   AST 26 02/07/2024   ALT 30 02/07/2024   ALKPHOS 64 02/07/2024   BILITOT 0.2 02/07/2024   GFRNONAA >60 06/14/2023   GFRAA 111  10/14/2020    Lab Results  Component Value Date   WBC 4.7 06/14/2023   NEUTROABS 3.0 06/14/2023   HGB 10.9 (L) 06/14/2023   HCT 35.1 (L) 06/14/2023   MCV 79.6 (L) 06/14/2023   PLT 96 (L) 06/14/2023     STUDIES: MM 3D DIAGNOSTIC MAMMOGRAM UNILATERAL RIGHT BREAST Result Date: 07/10/2024 CLINICAL DATA:  Patient with history of a 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 history of a benign RIGHT breast excisional biopsy. Patient underwent RIGHT breast stereotactic guided biopsy in 2022 which demonstrated benign fat necrosis at the site of RIBBON clip. Patient presents today with RIGHT-sided breast pain. EXAM: DIGITAL DIAGNOSTIC UNILATERAL RIGHT MAMMOGRAM WITH TOMOSYNTHESIS AND CAD; ULTRASOUND RIGHT BREAST LIMITED TECHNIQUE: Right digital diagnostic mammography and breast tomosynthesis was performed. The images were evaluated with computer-aided detection. ; Targeted ultrasound examination of the right breast was performed COMPARISON:  Previous exam(s). ACR Breast Density Category b: There are scattered areas of fibroglandular density. FINDINGS: Spot compression tomosynthesis views were obtained over the area of focal pain in the RIGHT breast. No suspicious mammographic finding is identified in this area. Stable postsurgical changes are noted with associated RIBBON shaped biopsy marking clip near the region of painful concern at site of previously biopsy-proven fat necrosis. No suspicious mass, microcalcification, or other finding is identified in the RIGHT breast. Patient is status post LEFT mastectomy. On physical exam, no suspicious mass is appreciated. Targeted RIGHT breast ultrasound was performed in the area of pain at the upper and inner breast. No suspicious solid or cystic mass is identified. IMPRESSION: 1. No mammographic or sonographic evidence of malignancy at the site of painful concern in the RIGHT breast. Any further workup of the patient's  symptoms should be based on the clinical assessment. 2. No mammographic evidence of malignancy in the RIGHT breast. 3. Breast pain is a common condition, which will often resolve on its own without intervention. It can be affected by hormonal changes, medication side effect, weight changes and fit of the bra. Pain may also be referred from other adjacent areas of the body. Breast pain may be improved by wearing adequate well-fitting support, over-the-counter topical and oral NSAID medication, low-fat diet, and ice/heat as needed. Studies have shown an improvement in cyclic pain  with use of evening primrose oil, vitamin D  and vitamin E. Clinical follow-up recommended to discuss any further work-up recommendations and appropriate treatment. RECOMMENDATION: Per protocol, patient may return to annual screening mammography in 1 year. However, given the history of breast cancer, the patient remains eligible for annual diagnostic mammography if preferred. (Code:SM-B-01Y) I have discussed the findings and recommendations with the patient. If applicable, a reminder letter will be sent to the patient regarding the next appointment. BI-RADS CATEGORY  2: Benign. Electronically Signed   By: Corean Salter M.D.   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 history of a benign RIGHT breast excisional biopsy. Patient underwent RIGHT breast stereotactic guided biopsy in 2022 which demonstrated benign fat necrosis at the site of RIBBON clip. Patient presents today with RIGHT-sided breast pain. EXAM: DIGITAL DIAGNOSTIC UNILATERAL RIGHT MAMMOGRAM WITH TOMOSYNTHESIS AND CAD; ULTRASOUND RIGHT BREAST LIMITED TECHNIQUE: Right digital diagnostic mammography and breast tomosynthesis was performed. The images were evaluated with computer-aided detection. ;  Targeted ultrasound examination of the right breast was performed COMPARISON:  Previous exam(s). ACR Breast Density Category b: There are scattered areas of fibroglandular density. FINDINGS: Spot compression tomosynthesis views were obtained over the area of focal pain in the RIGHT breast. No suspicious mammographic finding is identified in this area. Stable postsurgical changes are noted with associated RIBBON shaped biopsy marking clip near the region of painful concern at site of previously biopsy-proven fat necrosis. No suspicious mass, microcalcification, or other finding is identified in the RIGHT breast. Patient is status post LEFT mastectomy. On physical exam, no suspicious mass is appreciated. Targeted RIGHT breast ultrasound was performed in the area of pain at the upper and inner breast. No suspicious solid or cystic mass is identified. IMPRESSION: 1. No mammographic or sonographic evidence of malignancy at the site of painful concern in the RIGHT breast. Any further workup of the patient's symptoms should be based on the clinical assessment. 2. No mammographic evidence of malignancy in the RIGHT breast. 3. Breast pain is a common condition, which will often resolve on its own without intervention. It can be affected by hormonal changes, medication side effect, weight changes and fit of the bra. Pain may also be referred from other adjacent areas of the body. Breast pain may be improved by wearing adequate well-fitting support, over-the-counter topical and oral NSAID medication, low-fat diet, and ice/heat as needed. Studies have shown an improvement in cyclic pain with use of evening primrose oil, vitamin D  and vitamin E. Clinical follow-up recommended to discuss any further work-up recommendations and appropriate treatment. RECOMMENDATION: Per protocol, patient may return to annual screening mammography in 1 year. However, given the history of breast cancer, the patient remains eligible for annual  diagnostic mammography if preferred. (Code:SM-B-01Y) I have discussed the findings and recommendations with the patient. If applicable, a reminder letter will be sent to the patient regarding the next appointment. BI-RADS CATEGORY  2: Benign. Electronically Signed   By: Corean Salter M.D.   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  Previously, right breast biopsy was negative for malignancy. Patient only received 3 cycles of treatment, then carboplatinum and Taxotere  were discontinued secondary to persistent thrombocytopenia.  She last received carboplatinum and Taxotere  on December 22, 2021.  Patient was subsequently transitioned to maintenance Perjeta  and Herceptin  which she completed on October 25, 2022.  Continue letrozole  completing 5 years of treatment in November 2028.  Her most recent mammogram on July 10, 2024 was reported as BI-RADS 2.  Repeat in July 2026.  Return to clinic in 6 months with video-assisted telemedicine visit.  Bone health: Bone mineral density completed on February 19, 2023 reported T-score of -0.1 which is considered normal.  Will repeat bone mineral density along with mammogram in July 2026.   Thrombocytopenia: Chronic and unchanged.  Patient's most recent platelet count on June 14, 2023 was reported 96.  Repeat laboratory work next week. Anemia: Chronic and unchanged.  Patient's most recent hemoglobin is 10.9.  Repeat laboratory work next week. NASH: Continue follow-up with GI as scheduled.  I provided 20 minutes of face-to-face video visit time during this encounter which included chart review, counseling, and coordination of care as documented above.  Patient expressed understanding  and was in agreement with this plan. She also understands that She can call clinic at any time with any questions, concerns, or complaints.    Cancer Staging  Carcinoma of left breast [facility]) Staging form: Breast, AJCC 8th Edition - Pathologic stage from 08/30/2021: Stage IB (pT2, pN1a, cM0, G3, ER+, PR+, HER2+, Oncotype DX score: 18) - Signed by Jacobo Evalene PARAS, MD on 09/13/2021 Stage prefix: Initial diagnosis Multigene prognostic tests performed: Oncotype DX Recurrence score range: Greater than or equal to 11 Histologic grading system: 3 grade system  Evalene PARAS Jacobo, MD   07/23/2024 3:29 PM

## 2024-07-24 ENCOUNTER — Encounter: Payer: Self-pay | Admitting: Oncology

## 2024-07-24 ENCOUNTER — Other Ambulatory Visit: Payer: Self-pay

## 2024-07-31 ENCOUNTER — Ambulatory Visit
Admission: EM | Admit: 2024-07-31 | Discharge: 2024-07-31 | Disposition: A | Discharge status: Home / Self Care | Attending: Emergency Medicine | Admitting: Emergency Medicine

## 2024-07-31 ENCOUNTER — Telehealth: Payer: Self-pay | Admitting: Oncology

## 2024-07-31 ENCOUNTER — Inpatient Hospital Stay

## 2024-07-31 ENCOUNTER — Encounter: Payer: Self-pay | Admitting: Emergency Medicine

## 2024-07-31 DIAGNOSIS — J209 Acute bronchitis, unspecified: Secondary | ICD-10-CM | POA: Diagnosis not present

## 2024-07-31 MED ORDER — PREDNISONE 10 MG (21) PO TBPK: ORAL_TABLET | Freq: Every day | ORAL | 0 refills | Status: DC

## 2024-07-31 MED ORDER — ALBUTEROL SULFATE HFA 108 (90 BASE) MCG/ACT IN AERS: 2.0000 | INHALATION_SPRAY | Freq: Four times a day (QID) | RESPIRATORY_TRACT | 0 refills | Status: DC | PRN

## 2024-07-31 MED ORDER — BENZONATATE 100 MG PO CAPS: 100.0000 mg | ORAL_CAPSULE | Freq: Three times a day (TID) | ORAL | 0 refills | Status: DC

## 2024-07-31 MED ORDER — AZITHROMYCIN 250 MG PO TABS: 250.0000 mg | ORAL_TABLET | Freq: Every day | ORAL | 0 refills | Status: DC

## 2024-07-31 MED ORDER — PROMETHAZINE-DM 6.25-15 MG/5ML PO SYRP: 5.0000 mL | ORAL_SOLUTION | Freq: Every evening | ORAL | 0 refills | Status: DC | PRN

## 2024-07-31 MED ORDER — ALBUTEROL SULFATE (2.5 MG/3ML) 0.083% IN NEBU
2.5000 mg | INHALATION_SOLUTION | Freq: Once | RESPIRATORY_TRACT | Status: AC
  Administered 2024-07-31: 2.5 mg via RESPIRATORY_TRACT

## 2024-07-31 NOTE — ED Triage Notes (Signed)
 Patient reports cough, chest congestion and wheezing x 5 days. Patient had been using dayquil and Nyquil with relief.

## 2024-07-31 NOTE — Discharge Instructions (Signed)
 Take azithromycin  for bacterial coverage  Take prednisone  every morning with food to help you relax the airway to help with your breathing  You were given a breathing treatment here today in the clinic to open, at home you may use inhaler every 6 hours for shortness of breath and wheezing  You may use Tessalon  Perles every 8 hours for cough, may use cough syrup at bedtime to allow for rest  You can take Tylenol  as needed for fever reduction and pain relief.    For cough: honey 1/2 to 1 teaspoon (you can dilute the honey in water  or another fluid).  You can also use guaifenesin and dextromethorphan for cough. You can use a humidifier for chest congestion and cough.  If you don't have a humidifier, you can sit in the bathroom with the hot shower running.      For sore throat: try warm salt water  gargles, cepacol lozenges, throat spray, warm tea or water  with lemon/honey, popsicles or ice, or OTC cold relief medicine for throat discomfort.   For congestion: take a daily anti-histamine like Zyrtec, Claritin, and a oral decongestant, such as pseudoephedrine.  You can also use Flonase 1-2 sprays in each nostril daily.   It is important to stay hydrated: drink plenty of fluids (water , gatorade/powerade/pedialyte, juices, or teas) to keep your throat moisturized and help further relieve irritation/discomfort.

## 2024-07-31 NOTE — ED Provider Notes (Signed)
 Emily Tapia    CSN: [id number] Arrival date & time:   1104      History   Chief Complaint No chief complaint on file.   HPI Emily Tapia is a 63 y.o. female.   Patient presents for evaluation of, a nonproductive cough, shortness of breath at rest worsened by exertion and wheezing present for 5 days.  Progressively worsening.  Experiencing headaches and soreness in chest, back and abdomen exacerbated by coughing.  Decreased appetite but tolerable to some food and liquids.  Has attempted NyQuil.  Past Medical History:  Diagnosis Date   Anxiety    Aortic stenosis    Cirrhosis (HCC)    Diabetes mellitus    Edema    Heart murmur    Hypertension    Sleep apnea    Thyroid  disease     Patient Active Problem List   Diagnosis Date Noted   Bilateral lower extremity edema 02/07/2024   Hyperlipidemia associated with type 2 diabetes mellitus (HCC) 05/03/2023   Annual physical exam 05/03/2023   Vitamin D  deficiency 05/03/2023   SVT (supraventricular tachycardia) (HCC) 05/03/2023   Urinary frequency 05/03/2023   Diabetic peripheral neuropathy associated with type 2 diabetes mellitus (HCC) 01/25/2023   Non-alcoholic cirrhosis (HCC) 01/25/2023   Type 2 diabetes mellitus with hyperglycemia, without long-term current use of insulin  (HCC) 03/13/2022   Hypertension associated with diabetes (HCC) 03/13/2022   Malignant neoplasm of upper-inner quadrant of left breast in female, estrogen receptor positive (HCC) 10/23/2021   Carcinoma of left breast (HCC) 08/30/2021   Hypothyroidism 04/27/2015   Aortic valve stenosis 05/01/2011   Edema 03/27/2011   Anxiety, generalized 07/05/2006    Past Surgical History:  Procedure Laterality Date   ABDOMINAL HYSTERECTOMY  11/17/2013   BREAST BIOPSY     x2   BREAST BIOPSY Left 07/20/2021   US  Bx, Ribbon Clip, [facility]   BREAST BIOPSY Left 07/20/2021   Stereo Bx, X-clip, DCIS   BREAST BIOPSY Left 07/20/2021   Stereo biopsy,  coil clip, DCIS   BREAST BIOPSY Right 07/20/2021   Stereo Bx, Ribbon Clip, Fat necrosis   CESAREAN SECTION     x 2   CHOLECYSTECTOMY  12/17/2001   DUE TO STONES   COLONOSCOPY WITH PROPOFOL  N/A 07/26/2022   Procedure: COLONOSCOPY WITH PROPOFOL ;  Surgeon: Unk Corinn Skiff, MD;  Location: [facility] ENDOSCOPY;  Service: Gastroenterology;  Laterality: N/A;   DILATION AND CURETTAGE OF UTERUS     ELBOW SURGERY     ESOPHAGOGASTRODUODENOSCOPY (EGD) WITH PROPOFOL  N/A 07/26/2022   Procedure: ESOPHAGOGASTRODUODENOSCOPY (EGD) WITH PROPOFOL ;  Surgeon: Unk Corinn Skiff, MD;  Location: [facility] ENDOSCOPY;  Service: Gastroenterology;  Laterality: N/A;   POLYPECTOMY  12/17/1993   PORTACATH PLACEMENT Right 10/25/2021   Procedure: INSERTION PORT-A-CATH;  Surgeon: Lane Shope, MD;  Location: [facility] ORS;  Service: General;  Laterality: Right;   SIMPLE MASTECTOMY WITH AXILLARY SENTINEL NODE BIOPSY Left 08/16/2021   Procedure: SIMPLE MASTECTOMY WITH AXILLARY SENTINEL NODE BIOPSY;  Surgeon: Lane Shope, MD;  Location: [facility] ORS;  Service: General;  Laterality: Left;    OB History     Gravida  3   Para  2   Term      Preterm      AB      Living         SAB      IAB      Ectopic      Multiple      Live Births  Home Medications    Prior to Admission medications   Medication Sig Start Date End Date Taking? Authorizing Provider  albuterol  (VENTOLIN  HFA) 108 (90 Base) MCG/ACT inhaler Inhale 2 puffs into the lungs every 6 (six) hours as needed for wheezing or shortness of breath.   Yes Laurena Valko R, NP  azithromycin  (ZITHROMAX ) 250 MG tablet Take 1 tablet (250 mg total) by mouth daily. Take first 2 tablets together, then 1 every day until finished.   Yes Heylee Tant R, NP  benzonatate  (TESSALON ) 100 MG capsule Take 1 capsule (100 mg total) by mouth every 8 (eight) hours.   Yes Romen Yutzy R, NP  predniSONE  (STERAPRED UNI-PAK 21 TAB) 10 MG  (21) TBPK tablet Take by mouth daily. Take 6 tabs by mouth daily  for 1 days, then 5 tabs for 1 days, then 4 tabs for 1 days, then 3 tabs for 1 days, 2 tabs for 1 days, then 1 tab by mouth daily for 1 days   Yes Jailyne Chieffo R, NP  promethazine -dextromethorphan (PROMETHAZINE -DM) 6.25-15 MG/5ML syrup Take 5 mLs by mouth at bedtime as needed.   Yes Teresa Shelba SAUNDERS, NP  amLODipine  (NORVASC ) 5 MG tablet Take 1 tablet by mouth once daily 05/12/24   Donzella Lauraine SAILOR, DO  Ascorbic Acid (VITAMIN C PO) Take 1 tablet by mouth daily.    [provider]  aspirin 81 MG EC tablet Take 81 mg by mouth at bedtime.    [provider]  carvedilol  (COREG ) 25 MG tablet Take 1 tablet by mouth twice daily 05/12/24   Donzella Lauraine SAILOR, DO  Cholecalciferol (VITAMIN D3) 1000 UNITS CAPS Take 1,000 Units by mouth at bedtime.    [provider]  furosemide  (LASIX ) 40 MG tablet TAKE 1 TABLET BY MOUTH ONCE DAILY AS NEEDED FOR FLUID OR EDEMA 02/07/24   Pardue, Lauraine SAILOR, DO  glipiZIDE  (GLUCOTROL ) 5 MG tablet TAKE 1 TABLET BY MOUTH TWICE DAILY BEFORE A MEAL 05/12/24   Pardue, Lauraine SAILOR, DO  glucose blood (CONTOUR NEXT TEST) test strip Test fasting sugar each morning. Recheck if having hypoglycemic symptoms. 10/03/18   Chrismon, Marinda BRAVO, PA-C  hydrochlorothiazide  (HYDRODIURIL ) 25 MG tablet Take 1 tablet by mouth once daily 05/25/24   Donzella Lauraine SAILOR, DO  letrozole  (FEMARA ) 2.5 MG tablet Take 1 tablet by mouth once daily 04/13/24   Finnegan, Timothy J, MD  levothyroxine  (SYNTHROID ) 75 MCG tablet TAKE 1 TABLET BY MOUTH ONCE DAILY BEFORE BREAKFAST . 06/24/24   Pardue, Lauraine SAILOR, DO  lisinopril  (ZESTRIL ) 40 MG tablet Take 1 tablet (40 mg total) by mouth daily. 02/25/24   Donzella Lauraine SAILOR, DO  metFORMIN  (GLUCOPHAGE ) 1000 MG tablet Take 1 tablet (1,000 mg total) by mouth 2 (two) times daily with a meal. 02/19/24   Pardue, Lauraine SAILOR, DO  Multiple Vitamin (MULTIVITAMIN) tablet Take 1 tablet by mouth every morning.     [provider]  PARoxetine  (PAXIL ) 20 MG tablet Take 1 tablet (20 mg total) by mouth daily. 07/20/24   Donzella Lauraine SAILOR, DO  Semaglutide ,0.25 or 0.5MG /DOS, (OZEMPIC , 0.25 OR 0.5 MG/DOSE,) 2 MG/3ML SOPN Inject 0.25 mg into the skin once a week. Patient not taking: Reported on 07/16/2024 02/07/24   Donzella Lauraine SAILOR, DO  zinc gluconate 50 MG tablet Take 50 mg by mouth daily.    [provider]    Family History Family History  Problem Relation Age of Onset   Hyperlipidemia Mother    Thyroid  disease Mother  Hashimoto's thyroiditis Mother    Heart disease Father        open heart surgery   Heart failure Father    Diabetes Father    Dementia Father    Thyroid  disease Brother    Breast cancer Maternal Grandmother    Diabetes Paternal Grandmother    Prostate cancer Paternal Grandfather     Social History Social History   Tobacco Use   Smoking status: Former    Current packs/day: 0.00    Average packs/day: 1.5 packs/day for 25.0 years (37.5 ttl pk-yrs)    Types: Cigarettes    Start date: 11/24/1976    Quit date: 11/24/2001    Years since quitting: 22.6   Smokeless tobacco: Never  Vaping Use   Vaping status: Never Used  Substance Use Topics   Alcohol use: Yes    Alcohol/week: 0.0 standard drinks of alcohol    Comment: OCCASIONALLY DRINKS WINE, ONCE A WEEK   Drug use: No     Allergies   Fosaprepitant    Review of Systems Review of Systems   Physical Exam Triage Vital Signs ED Triage Vitals  Encounter Vitals Group     BP  1214 125/66     Girls Systolic BP Percentile --      Girls Diastolic BP Percentile --      Boys Systolic BP Percentile --      Boys Diastolic BP Percentile --      Pulse Rate  1214 63     Resp  1214 20     Temp  1214 99.3 F (37.4 C)     Temp Source  1214 Oral     SpO2  1214 99 %     Weight --      Height --      Head Circumference --      Peak Flow --      Pain Score   1213 0     Pain Loc --      Pain Education --      Exclude from Growth Chart --    No data found.  Updated Vital Signs BP 125/66 (BP Location: Right Arm)   Pulse 63   Temp 99.3 F (37.4 C) (Oral)   Resp 20   SpO2 99%   Visual Acuity Right Eye Distance:   Left Eye Distance:   Bilateral Distance:    Right Eye Near:   Left Eye Near:    Bilateral Near:     Physical Exam Constitutional:      Appearance: Normal appearance.  HENT:     Right Ear: Ear canal and external ear normal.     Left Ear: Tympanic membrane, ear canal and external ear normal.     Nose: Congestion present.     Mouth/Throat:     Mouth: Mucous membranes are moist.     Pharynx: Oropharynx is clear.  Eyes:     Extraocular Movements: Extraocular movements intact.  Cardiovascular:     Rate and Rhythm: Normal rate and regular rhythm.     Pulses: Normal pulses.     Heart sounds: Normal heart sounds.  Pulmonary:     Effort: Pulmonary effort is normal.     Comments: Audible wheezing  Neurological:     Mental Status: She is alert and oriented to person, place, and time. Mental status is at baseline.      UC Treatments / Results  Labs (all labs ordered are listed, but only abnormal results are displayed)  Labs Reviewed - No data to display  EKG   Radiology No results found.  Procedures Procedures (including critical care time)  Medications Ordered in UC Medications  albuterol  (PROVENTIL ) (2.5 MG/3ML) 0.083% nebulizer solution 2.5 mg (2.5 mg Nebulization Given  1222)    Initial Impression / Assessment and Plan / UC Course  I have reviewed the triage vital signs and the nursing notes.  Pertinent labs & imaging results that were available during my care of the patient were reviewed by me and considered in my medical decision making (see chart for details).  Acute bronchitis  Vital signs are stable, O2 saturation 99% on room air, audible wheezing, patient in no signs of distress nor toxic  appearing, albuterol  nebulizer given and on reevaluation wheezing has subsided.  Prescribed azithromycin , prednisone , albuterol  inhaler, Tessalon  and Promethazine  DM.  Recommended over-the-counter medications and nonpharmacological supportive care advised follow-up if symptoms need to persist Final Clinical Impressions(s) / UC Diagnoses   Final diagnoses:  Acute bronchitis, unspecified organism     Discharge Instructions      Take azithromycin  for bacterial coverage  Take prednisone  every morning with food to help you relax the airway to help with your breathing  You were given a breathing treatment here today in the clinic to open, at home you may use inhaler every 6 hours for shortness of breath and wheezing  You may use Tessalon  Perles every 8 hours for cough, may use cough syrup at bedtime to allow for rest  You can take Tylenol  as needed for fever reduction and pain relief.    For cough: honey 1/2 to 1 teaspoon (you can dilute the honey in water  or another fluid).  You can also use guaifenesin and dextromethorphan for cough. You can use a humidifier for chest congestion and cough.  If you don't have a humidifier, you can sit in the bathroom with the hot shower running.      For sore throat: try warm salt water  gargles, cepacol lozenges, throat spray, warm tea or water  with lemon/honey, popsicles or ice, or OTC cold relief medicine for throat discomfort.   For congestion: take a daily anti-histamine like Zyrtec, Claritin, and a oral decongestant, such as pseudoephedrine.  You can also use Flonase 1-2 sprays in each nostril daily.   It is important to stay hydrated: drink plenty of fluids (water , gatorade/powerade/pedialyte, juices, or teas) to keep your throat moisturized and help further relieve irritation/discomfort.     ED Prescriptions     Medication Sig Dispense Auth. Provider   azithromycin  (ZITHROMAX ) 250 MG tablet Take 1 tablet (250 mg total) by mouth daily. Take first  2 tablets together, then 1 every day until finished. 6 tablet Gelene Recktenwald R, NP   predniSONE  (STERAPRED UNI-PAK 21 TAB) 10 MG (21) TBPK tablet Take by mouth daily. Take 6 tabs by mouth daily  for 1 days, then 5 tabs for 1 days, then 4 tabs for 1 days, then 3 tabs for 1 days, 2 tabs for 1 days, then 1 tab by mouth daily for 1 days 21 tablet Abdelrahman Nair R, NP   benzonatate  (TESSALON ) 100 MG capsule Take 1 capsule (100 mg total) by mouth every 8 (eight) hours. 21 capsule Cheyenna Pankowski R, NP   promethazine -dextromethorphan (PROMETHAZINE -DM) 6.25-15 MG/5ML syrup Take 5 mLs by mouth at bedtime as needed. 118 mL Paislynn Hegstrom R, NP   albuterol  (VENTOLIN  HFA) 108 (90 Base) MCG/ACT inhaler Inhale 2 puffs into the lungs every 6 (six) hours as  needed for wheezing or shortness of breath. 18 g Teresa Shelba SAUNDERS, NP      PDMP not reviewed this encounter.   Teresa Shelba SAUNDERS, NP  [phone]

## 2024-07-31 NOTE — Telephone Encounter (Signed)
 Pt scheduled for lab today but she has bronchitis and canceled lab appt. Pt states she will call back when she feels better.

## 2024-08-09 ENCOUNTER — Other Ambulatory Visit: Payer: Self-pay | Admitting: Family Medicine

## 2024-08-09 DIAGNOSIS — E1159 Type 2 diabetes mellitus with other circulatory complications: Secondary | ICD-10-CM

## 2024-08-09 DIAGNOSIS — R600 Localized edema: Secondary | ICD-10-CM

## 2024-08-10 NOTE — Telephone Encounter (Signed)
 Requested medications are due for refill today.  yes  Requested medications are on the active medications list.  yes  Last refill. 05/12/2024   Future visit scheduled.   no  Notes to clinic.  Expired labs - pt needs an office visit.    Requested Prescriptions  Pending Prescriptions Disp Refills   furosemide  (LASIX ) 40 MG tablet [Pharmacy Med Name: Furosemide  40 MG Oral Tablet] 90 tablet 0    Sig: TAKE 1 TABLET BY MOUTH ONCE DAILY AS NEEDED FOR  FLUID  OR  EDEMA     Cardiovascular:  Diuretics - Loop Failed - 08/10/2024  5:56 PM      Failed - K in normal range and within 180 days    Potassium  Date Value Ref Range Status  02/07/2024 4.2 3.5 - 5.2 mmol/L Final         Failed - Ca in normal range and within 180 days    Calcium   Date Value Ref Range Status  02/07/2024 9.6 8.7 - 10.3 mg/dL Final         Failed - Na in normal range and within 180 days    Sodium  Date Value Ref Range Status  02/07/2024 143 134 - 144 mmol/L Final         Failed - Cr in normal range and within 180 days    Creatinine  Date Value Ref Range Status  06/14/2023 0.83 0.44 - 1.00 mg/dL Final   Creatinine, Ser  Date Value Ref Range Status  02/07/2024 0.77 0.57 - 1.00 mg/dL Final   Creatinine, POC  Date Value Ref Range Status  02/24/2016 NA mg/dL Final         Failed - Cl in normal range and within 180 days    Chloride  Date Value Ref Range Status  02/07/2024 101 96 - 106 mmol/L Final         Failed - Mg Level in normal range and within 180 days    Magnesium   Date Value Ref Range Status  06/14/2023 1.5 (L) 1.7 - 2.4 mg/dL Final    Comment:    Performed at [facility], [address] [place], [place] [address]         Failed - Valid encounter within last 6 months    Recent Outpatient Visits           4 months ago Seasonal allergic rhinitis due to pollen   [facility], Angeline ORN, NP   6 months ago Hypertension associated with diabetes [facility])    [facility] Pardue, Lauraine SAILOR, DO       Future Appointments             In 5 months Finnegan, Evalene PARAS, MD [facility] Of [place]            Passed - Last BP in normal range    BP Readings from Last 1 Encounters:  07/31/24 125/66          glipiZIDE  (GLUCOTROL ) 5 MG tablet [Pharmacy Med Name: glipiZIDE  5 MG Oral Tablet] 180 tablet 0    Sig: TAKE 1 TABLET BY MOUTH TWICE DAILY BEFORE A MEAL     Endocrinology:  Diabetes - Sulfonylureas Failed - 08/10/2024  5:56 PM      Failed - HBA1C is between 0 and 7.9 and within 180 days    Hgb A1c MFr Bld  Date Value Ref Range Status  02/07/2024 8.7 (H) 4.8 - 5.6 % Final    Comment:             Prediabetes: 5.7 - 6.4          Diabetes: >6.4          Glycemic control for adults with diabetes: <7.0          Failed - Valid encounter within last 6 months    Recent Outpatient Visits           4 months ago Seasonal allergic rhinitis due to pollen   [facility] [place], Angeline ORN, NP   6 months ago Hypertension associated with diabetes [facility])   [facility] Pardue, Lauraine SAILOR, DO       Future Appointments             In 5 months Finnegan, Evalene PARAS, MD [facility] - [facility] Dept Of [place]. [facility]            Passed - Cr in normal range and within 360 days    Creatinine  Date Value Ref Range Status  06/14/2023 0.83 0.44 - 1.00 mg/dL Final   Creatinine, Ser  Date Value Ref Range Status  02/07/2024 0.77 0.57 - 1.00 mg/dL Final   Creatinine, POC  Date Value Ref Range Status  02/24/2016 NA mg/dL Final         Signed Prescriptions Disp Refills   amLODipine  (NORVASC ) 5 MG tablet 30 tablet 0    Sig: Take 1 tablet by mouth once daily     Cardiovascular: Calcium  Channel Blockers 2 Failed - 08/10/2024  5:56 PM      Failed - Valid encounter within last 6 months    Recent Outpatient  Visits           4 months ago Seasonal allergic rhinitis due to pollen   [facility] [place], Angeline ORN, NP   6 months ago Hypertension associated with diabetes [facility])   [place] [facility] Pardue, Lauraine SAILOR, DO       Future Appointments             In 5 months Finnegan, Evalene PARAS, MD [facility] - [facility] Dept Of [place]. [facility]            Passed - Last BP in normal range    BP Readings from Last 1 Encounters:  07/31/24 125/66         Passed - Last Heart Rate in normal range    Pulse Readings from Last 1 Encounters:  07/31/24 63          carvedilol  (COREG ) 25 MG tablet 60 tablet 0    Sig: Take 1 tablet by mouth twice daily     Cardiovascular: Beta Blockers 3 Failed - 08/10/2024  5:56 PM      Failed - Valid encounter within last 6 months    Recent Outpatient Visits           4 months ago Seasonal allergic rhinitis due to pollen   [facility], Angeline ORN, NP   6 months ago Hypertension associated with diabetes [facility])   [facility] Pardue, Lauraine SAILOR, DO       Future Appointments  In 5 months Finnegan, Evalene PARAS, MD [facility] - A Dept Of [place]. [facility]            Passed - Cr in normal range and within 360 days    Creatinine  Date Value Ref Range Status  06/14/2023 0.83 0.44 - 1.00 mg/dL Final   Creatinine, Ser  Date Value Ref Range Status  02/07/2024 0.77 0.57 - 1.00 mg/dL Final   Creatinine, POC  Date Value Ref Range Status  02/24/2016 NA mg/dL Final         Passed - AST in normal range and within 360 days    AST  Date Value Ref Range Status  02/07/2024 26 0 - 40 IU/L Final  06/14/2023 25 15 - 41 U/L Final         Passed - ALT in normal range and within 360 days    ALT  Date Value Ref Range Status  02/07/2024 30 0 - 32 IU/L Final  06/14/2023 28 0 - 44 U/L Final          Passed - Last BP in normal range    BP Readings from Last 1 Encounters:  07/31/24 125/66         Passed - Last Heart Rate in normal range    Pulse Readings from Last 1 Encounters:  07/31/24 63

## 2024-08-10 NOTE — Telephone Encounter (Signed)
 Courtesy refill. Patient will need an office visit for additional refills.  Requested Prescriptions  Pending Prescriptions Disp Refills   furosemide  (LASIX ) 40 MG tablet [Pharmacy Med Name: Furosemide  40 MG Oral Tablet] 90 tablet 0    Sig: TAKE 1 TABLET BY MOUTH ONCE DAILY AS NEEDED FOR  FLUID  OR  EDEMA     Cardiovascular:  Diuretics - Loop Failed - 08/10/2024  5:53 PM      Failed - K in normal range and within 180 days    Potassium  Date Value Ref Range Status  02/07/2024 4.2 3.5 - 5.2 mmol/L Final         Failed - Ca in normal range and within 180 days    Calcium   Date Value Ref Range Status  02/07/2024 9.6 8.7 - 10.3 mg/dL Final         Failed - Na in normal range and within 180 days    Sodium  Date Value Ref Range Status  02/07/2024 143 134 - 144 mmol/L Final         Failed - Cr in normal range and within 180 days    Creatinine  Date Value Ref Range Status  06/14/2023 0.83 0.44 - 1.00 mg/dL Final   Creatinine, Ser  Date Value Ref Range Status  02/07/2024 0.77 0.57 - 1.00 mg/dL Final   Creatinine, POC  Date Value Ref Range Status  02/24/2016 NA mg/dL Final         Failed - Cl in normal range and within 180 days    Chloride  Date Value Ref Range Status  02/07/2024 101 96 - 106 mmol/L Final         Failed - Mg Level in normal range and within 180 days    Magnesium   Date Value Ref Range Status  06/14/2023 1.5 (L) 1.7 - 2.4 mg/dL Final    Comment:    Performed at [facility], [address] [place], [place] [address]         Failed - Valid encounter within last 6 months    Recent Outpatient Visits           4 months ago Seasonal allergic rhinitis due to pollen   [facility] [place], Angeline ORN, NP   6 months ago Hypertension associated with diabetes [facility])   [facility] [place], Lauraine SAILOR, DO       Future Appointments             In 5 months Finnegan, Evalene PARAS, MD [facility]  Dept Of [place]            Passed - Last BP in normal range    BP Readings from Last 1 Encounters:  07/31/24 125/66          amLODipine  (NORVASC ) 5 MG tablet [Pharmacy Med Name: amLODIPine  Besylate 5 MG Oral Tablet] 30 tablet 0    Sig: Take 1 tablet by mouth once daily     Cardiovascular: Calcium  Channel Blockers 2 Failed - 08/10/2024  5:53 PM      Failed - Valid encounter within last 6 months    Recent Outpatient Visits           4 months ago Seasonal allergic rhinitis due to pollen   [facility] [place], Angeline ORN, NP   6 months ago Hypertension associated with diabetes (HCC)  [facility] [place], Lauraine SAILOR, DO       Future Appointments             In 5 months Finnegan, Evalene PARAS, MD [facility] - A Dept Of [place]. [facility]            Passed - Last BP in normal range    BP Readings from Last 1 Encounters:  07/31/24 125/66         Passed - Last Heart Rate in normal range    Pulse Readings from Last 1 Encounters:  07/31/24 63          carvedilol  (COREG ) 25 MG tablet [Pharmacy Med Name: Carvedilol  25 MG Oral Tablet] 60 tablet 0    Sig: Take 1 tablet by mouth twice daily     Cardiovascular: Beta Blockers 3 Failed - 08/10/2024  5:53 PM      Failed - Valid encounter within last 6 months    Recent Outpatient Visits           4 months ago Seasonal allergic rhinitis due to pollen   [facility] [place], Angeline ORN, NP   6 months ago Hypertension associated with diabetes [facility])   [facility] [place], Lauraine SAILOR, DO       Future Appointments             In 5 months Finnegan, Evalene PARAS, MD [facility] - A Dept Of . [facility]            Passed - Cr in normal range and within 360 days    Creatinine  Date Value Ref Range Status  06/14/2023 0.83 0.44 - 1.00 mg/dL Final    Creatinine, Ser  Date Value Ref Range Status  02/07/2024 0.77 0.57 - 1.00 mg/dL Final   Creatinine, POC  Date Value Ref Range Status  02/24/2016 NA mg/dL Final         Passed - AST in normal range and within 360 days    AST  Date Value Ref Range Status  02/07/2024 26 0 - 40 IU/L Final  06/14/2023 25 15 - 41 U/L Final         Passed - ALT in normal range and within 360 days    ALT  Date Value Ref Range Status  02/07/2024 30 0 - 32 IU/L Final  06/14/2023 28 0 - 44 U/L Final         Passed - Last BP in normal range    BP Readings from Last 1 Encounters:  07/31/24 125/66         Passed - Last Heart Rate in normal range    Pulse Readings from Last 1 Encounters:  07/31/24 63          glipiZIDE  (GLUCOTROL ) 5 MG tablet [Pharmacy Med Name: glipiZIDE  5 MG Oral Tablet] 180 tablet 0    Sig: TAKE 1 TABLET BY MOUTH TWICE DAILY BEFORE A MEAL     Endocrinology:  Diabetes - Sulfonylureas Failed - 08/10/2024  5:53 PM      Failed - HBA1C is between 0 and 7.9 and within 180 days    Hgb A1c MFr Bld  Date Value Ref Range Status  02/07/2024 8.7 (H) 4.8 - 5.6 % Final    Comment:             Prediabetes: 5.7 -  6.4          Diabetes: >6.4          Glycemic control for adults with diabetes: <7.0          Failed - Valid encounter within last 6 months    Recent Outpatient Visits           4 months ago Seasonal allergic rhinitis due to pollen   [facility] [place], Angeline ORN, NP   6 months ago Hypertension associated with diabetes [facility])   [facility] [place], Lauraine SAILOR, DO       Future Appointments             In 5 months Finnegan, Evalene PARAS, MD [facility] - A Dept Of [place]. [facility]            Passed - Cr in normal range and within 360 days    Creatinine  Date Value Ref Range Status  06/14/2023 0.83 0.44 - 1.00 mg/dL Final   Creatinine, Ser  Date Value Ref Range Status  02/07/2024 0.77  0.57 - 1.00 mg/dL Final   Creatinine, POC  Date Value Ref Range Status  02/24/2016 NA mg/dL Final

## 2024-08-24 ENCOUNTER — Other Ambulatory Visit: Payer: Self-pay | Admitting: Family Medicine

## 2024-08-24 NOTE — Telephone Encounter (Signed)
 LOV 02/07/24 NOV none on file  [facility] 05/25/24 LABS 02/07/24

## 2024-09-08 ENCOUNTER — Other Ambulatory Visit: Payer: Self-pay | Admitting: Family Medicine

## 2024-09-09 ENCOUNTER — Other Ambulatory Visit: Payer: Self-pay | Admitting: Family Medicine

## 2024-09-09 DIAGNOSIS — I152 Hypertension secondary to endocrine disorders: Secondary | ICD-10-CM

## 2024-09-22 ENCOUNTER — Ambulatory Visit
Admission: EM | Admit: 2024-09-22 | Discharge: 2024-09-22 | Disposition: A | Discharge status: Home / Self Care | Attending: Emergency Medicine | Admitting: Emergency Medicine

## 2024-09-22 ENCOUNTER — Encounter: Payer: Self-pay | Admitting: Emergency Medicine

## 2024-09-22 DIAGNOSIS — B029 Zoster without complications: Secondary | ICD-10-CM

## 2024-09-22 MED ORDER — VALACYCLOVIR HCL 1 G PO TABS: 1000.0000 mg | ORAL_TABLET | Freq: Three times a day (TID) | ORAL | 0 refills | Status: AC

## 2024-09-22 NOTE — ED Provider Notes (Signed)
 [name]    CSN: [id number] Arrival date & time: 09/22/24  1556      History   Chief Complaint Chief Complaint  Patient presents with   Rash    HPI Emily Tapia is a 63 y.o. female.   Patient presents for evaluation of pain to the left arm beginning 5 days ago, radiating down described as a tingling.  Noticed a rash to the left arm beginning 3 days ago which is pruritic.  Has not attempted treatment.  Denies changes in toiletries diet or recent travel.  No known sick contact with similar symptoms.  Has shingles vaccine.  Past Medical History:  Diagnosis Date   Anxiety    Aortic stenosis    Cirrhosis (HCC)    Diabetes mellitus    Edema    Heart murmur    Hypertension    Sleep apnea    Thyroid  disease     Patient Active Problem List   Diagnosis Date Noted   Bilateral lower extremity edema 02/07/2024   Hyperlipidemia associated with type 2 diabetes mellitus (HCC) 05/03/2023   Annual physical exam 05/03/2023   Vitamin D  deficiency 05/03/2023   SVT (supraventricular tachycardia) 05/03/2023   Urinary frequency 05/03/2023   Diabetic peripheral neuropathy associated with type 2 diabetes mellitus (HCC) 01/25/2023   Non-alcoholic cirrhosis (HCC) 01/25/2023   Type 2 diabetes mellitus with hyperglycemia, without long-term current use of insulin  (HCC) 03/13/2022   Hypertension associated with diabetes (HCC) 03/13/2022   Malignant neoplasm of upper-inner quadrant of left breast in female, estrogen receptor positive (HCC) 10/23/2021   Carcinoma of left breast (HCC) 08/30/2021   Hypothyroidism 04/27/2015   Aortic valve stenosis 05/01/2011   Edema 03/27/2011   Anxiety, generalized 07/05/2006    Past Surgical History:  Procedure Laterality Date   ABDOMINAL HYSTERECTOMY  11/17/2013   BREAST BIOPSY     x2   BREAST BIOPSY Left 07/20/2021   US  Bx, Ribbon Clip, [facility]   BREAST BIOPSY Left 07/20/2021   Stereo Bx, X-clip, DCIS   BREAST BIOPSY Left 07/20/2021    Stereo biopsy, coil clip, DCIS   BREAST BIOPSY Right 07/20/2021   Stereo Bx, Ribbon Clip, Fat necrosis   CESAREAN SECTION     x 2   CHOLECYSTECTOMY  12/17/2001   DUE TO STONES   COLONOSCOPY WITH PROPOFOL  N/A 07/26/2022   Procedure: COLONOSCOPY WITH PROPOFOL ;  Surgeon: Unk Corinn Skiff, MD;  Location: [facility] ENDOSCOPY;  Service: Gastroenterology;  Laterality: N/A;   DILATION AND CURETTAGE OF UTERUS     ELBOW SURGERY     ESOPHAGOGASTRODUODENOSCOPY (EGD) WITH PROPOFOL  N/A 07/26/2022   Procedure: ESOPHAGOGASTRODUODENOSCOPY (EGD) WITH PROPOFOL ;  Surgeon: Unk Corinn Skiff, MD;  Location: [facility] ENDOSCOPY;  Service: Gastroenterology;  Laterality: N/A;   POLYPECTOMY  12/17/1993   PORTACATH PLACEMENT Right 10/25/2021   Procedure: INSERTION PORT-A-CATH;  Surgeon: Lane Shope, MD;  Location: [facility] ORS;  Service: General;  Laterality: Right;   SIMPLE MASTECTOMY WITH AXILLARY SENTINEL NODE BIOPSY Left 08/16/2021   Procedure: SIMPLE MASTECTOMY WITH AXILLARY SENTINEL NODE BIOPSY;  Surgeon: Lane Shope, MD;  Location: [facility] ORS;  Service: General;  Laterality: Left;    OB History     Gravida  3   Para  2   Term      Preterm      AB      Living         SAB      IAB      Ectopic  Multiple      Live Births               Home Medications    Prior to Admission medications   Medication Sig Start Date End Date Taking? Authorizing Provider  valACYclovir (VALTREX) 1000 MG tablet Take 1 tablet (1,000 mg total) by mouth 3 (three) times daily for 7 days. 09/22/24 09/29/24 Yes Gurshan Settlemire, Shelba SAUNDERS, NP  albuterol  (VENTOLIN  HFA) 108 (90 Base) MCG/ACT inhaler Inhale 2 puffs into the lungs every 6 (six) hours as needed for wheezing or shortness of breath. 07/31/24   Teresa Shelba SAUNDERS, NP  amLODipine  (NORVASC ) 5 MG tablet Take 1 tablet by mouth once daily 09/09/24   Donzella Lauraine SAILOR, DO  Ascorbic Acid (VITAMIN C PO) Take 1 tablet by mouth daily.    [provider]  aspirin  81 MG EC tablet Take 81 mg by mouth at bedtime.    [provider]  azithromycin  (ZITHROMAX ) 250 MG tablet Take 1 tablet (250 mg total) by mouth daily. Take first 2 tablets together, then 1 every day until finished. 07/31/24   Sadey Yandell, Shelba SAUNDERS, NP  benzonatate  (TESSALON ) 100 MG capsule Take 1 capsule (100 mg total) by mouth every 8 (eight) hours. 07/31/24   Corvin Sorbo, Shelba SAUNDERS, NP  carvedilol  (COREG ) 25 MG tablet TAKE 1 TABLET BY MOUTH TWICE DAILY. OFFICE VISIT NEEDED FOR FURTHER REFILLS. 09/08/24   Donzella Lauraine SAILOR, DO  Cholecalciferol (VITAMIN D3) 1000 UNITS CAPS Take 1,000 Units by mouth at bedtime.    [provider]  furosemide  (LASIX ) 40 MG tablet TAKE 1 TABLET BY MOUTH ONCE DAILY AS NEEDED FOR  FLUID  OR  EDEMA 08/11/24   Pardue, Lauraine SAILOR, DO  glipiZIDE  (GLUCOTROL ) 5 MG tablet TAKE 1 TABLET BY MOUTH TWICE DAILY BEFORE A MEAL 08/11/24   P[place], Lauraine SAILOR, DO  glucose blood (CONTOUR NEXT TEST) test strip Test fasting sugar each morning. Recheck if having hypoglycemic symptoms. 10/03/18   Chrismon, Marinda BRAVO, PA-C  hydrochlorothiazide  (HYDRODIURIL ) 25 MG tablet Take 1 tablet (25 mg total) by mouth daily. Need appointment 08/25/24   Donzella Lauraine SAILOR, DO  letrozole  (FEMARA ) 2.5 MG tablet Take 1 tablet by mouth once daily 04/13/24   Jacobo Evalene PARAS, MD  levothyroxine  (SYNTHROID ) 75 MCG tablet TAKE 1 TABLET BY MOUTH ONCE DAILY BEFORE BREAKFAST . 06/24/24   P[place], Lauraine SAILOR, DO  lisinopril  (ZESTRIL ) 40 MG tablet Take 1 tablet (40 mg total) by mouth daily. 02/25/24   Donzella Lauraine SAILOR, DO  metFORMIN  (GLUCOPHAGE ) 1000 MG tablet Take 1 tablet (1,000 mg total) by mouth 2 (two) times daily with a meal. 02/19/24   [place], Lauraine SAILOR, DO  Multiple Vitamin (MULTIVITAMIN) tablet Take 1 tablet by mouth every morning.    [provider]  PARoxetine  (PAXIL ) 20 MG tablet Take 1 tablet (20 mg total) by mouth daily. 07/20/24   Donzella Lauraine SAILOR, DO  predniSONE  (STERAPRED UNI-PAK 21 TAB) 10 MG (21) TBPK tablet Take  by mouth daily. Take 6 tabs by mouth daily  for 1 days, then 5 tabs for 1 days, then 4 tabs for 1 days, then 3 tabs for 1 days, 2 tabs for 1 days, then 1 tab by mouth daily for 1 days 07/31/24   Teresa Shelba SAUNDERS, NP  promethazine -dextromethorphan (PROMETHAZINE -DM) 6.25-15 MG/5ML syrup Take 5 mLs by mouth at bedtime as needed. 07/31/24   Eyla Tallon, Shelba SAUNDERS, NP  Semaglutide ,0.25 or 0.5MG /DOS, (OZEMPIC , 0.25 OR 0.5 MG/DOSE,) 2 MG/3ML SOPN Inject  0.25 mg into the skin once a week. Patient not taking: Reported on 07/16/2024 02/07/24   Donzella Lauraine SAILOR, DO  zinc gluconate 50 MG tablet Take 50 mg by mouth daily.    [provider]    Family History Family History  Problem Relation Age of Onset   Hyperlipidemia Mother    Thyroid  disease Mother    Hashimoto's thyroiditis Mother    Heart disease Father        open heart surgery   Heart failure Father    Diabetes Father    Dementia Father    Thyroid  disease Brother    Breast cancer Maternal Grandmother    Diabetes Paternal Grandmother    Prostate cancer Paternal Grandfather     Social History Social History   Tobacco Use   Smoking status: Former    Current packs/day: 0.00    Average packs/day: 1.5 packs/day for 25.0 years (37.5 ttl pk-yrs)    Types: Cigarettes    Start date: 11/24/1976    Quit date: 11/24/2001    Years since quitting: 22.8   Smokeless tobacco: Never  Vaping Use   Vaping status: Never Used  Substance Use Topics   Alcohol use: Yes    Alcohol/week: 0.0 standard drinks of alcohol    Comment: OCCASIONALLY DRINKS WINE, ONCE A WEEK   Drug use: No     Allergies   Fosaprepitant    Review of Systems Review of Systems  Skin:  Positive for rash.     Physical Exam Triage Vital Signs ED Triage Vitals  Encounter Vitals Group     BP 09/22/24 1639 136/68     Girls Systolic BP Percentile --      Girls Diastolic BP Percentile --      Boys Systolic BP Percentile --      Boys Diastolic BP Percentile --      Pulse  Rate 09/22/24 1639 67     Resp 09/22/24 1639 20     Temp 09/22/24 1639 98.3 F (36.8 C)     Temp Source 09/22/24 1639 Oral     SpO2 09/22/24 1639 95 %     Weight --      Height --      Head Circumference --      Peak Flow --      Pain Score 09/22/24 1636 4     Pain Loc --      Pain Education --      Exclude from Growth Chart --    No data found.  Updated Vital Signs BP 136/68 (BP Location: Right Arm)   Pulse 67   Temp 98.3 F (36.8 C) (Oral)   Resp 20   SpO2 95%   Visual Acuity Right Eye Distance:   Left Eye Distance:   Bilateral Distance:    Right Eye Near:   Left Eye Near:    Bilateral Near:     Physical Exam Constitutional:      Appearance: Normal appearance.  Eyes:     Extraocular Movements: Extraocular movements intact.  Pulmonary:     Effort: Pulmonary effort is normal.  Skin:    Comments: Blistering erythematous rash present to the medial aspect of the left upper arm  Neurological:     Mental Status: She is alert and oriented to person, place, and time. Mental status is at baseline.      UC Treatments / Results  Labs (all labs ordered are listed, but only abnormal results are displayed) Labs Reviewed -  No data to display  EKG   Radiology No results found.  Procedures Procedures (including critical care time)  Medications Ordered in UC Medications - No data to display  Initial Impression / Assessment and Plan / UC Course  I have reviewed the triage vital signs and the nursing notes.  Pertinent labs & imaging results that were available during my care of the patient were reviewed by me and considered in my medical decision making (see chart for details).  Herpes zoster without complication  Presentation and symptomology consistent with above diagnosis, discussed with patient, prescribed Valtrex and discussed administration, given written handout in regards to diagnosis and discussed over-the-counter medications and nonpharmacological  supportive care advised follow-up with urgent care as needed Final Clinical Impressions(s) / UC Diagnoses   Final diagnoses:  Herpes zoster without complication     Discharge Instructions      Today you are evaluated for your rash which is consistent and presentation with herpes zoster commonly known as shingles which is a viral rash causing itching and pain on 1 side of the body, following the nerves and dermatomes  Take Valtrex every 8 hours for 7 days to suppress the virus ideally helping to tone down symptoms and for rash to resolve more quickly  Rash is considered contagious until scabbed over, it is spread by contact with the fluid within the rash, keep area covered by clothing or nonadherent dressing like a Band-Aid  You may use allergy medicine such as Claritin or Zyrtec or topical Benadryl  cream, calamine lotion to help with itching  For pain you may use ice or lidocaine  cream  If you touch the area wash hands immediately  If you have any further concerns please follow-up for reevaluation   ED Prescriptions     Medication Sig Dispense Auth. Provider   valACYclovir (VALTREX) 1000 MG tablet Take 1 tablet (1,000 mg total) by mouth 3 (three) times daily for 7 days. 21 tablet Aarush Stukey R, NP      PDMP not reviewed this encounter.   Teresa Shelba SAUNDERS, NP 09/22/24 [phone]

## 2024-09-22 NOTE — ED Triage Notes (Signed)
 Patient reports red rash on left upper forearm. Patient states rash started out itching and now it just started hurting. Patient has says her fore feels tingly. Rates pain 4/10. Patient  states she made a paste mixed with castor oil and baking soda with no improvement.

## 2024-09-22 NOTE — Discharge Instructions (Signed)
 Today you are evaluated for your rash which is consistent and presentation with herpes zoster commonly known as shingles which is a viral rash causing itching and pain on 1 side of the body, following the nerves and dermatomes  Take Valtrex every 8 hours for 7 days to suppress the virus ideally helping to tone down symptoms and for rash to resolve more quickly  Rash is considered contagious until scabbed over, it is spread by contact with the fluid within the rash, keep area covered by clothing or nonadherent dressing like a Band-Aid  You may use allergy medicine such as Claritin or Zyrtec or topical Benadryl  cream, calamine lotion to help with itching  For pain you may use ice or lidocaine  cream  If you touch the area wash hands immediately  If you have any further concerns please follow-up for reevaluation

## 2024-10-10 ENCOUNTER — Other Ambulatory Visit: Payer: Self-pay | Admitting: Oncology

## 2024-10-13 ENCOUNTER — Encounter: Payer: Self-pay | Admitting: Oncology

## 2024-10-17 ENCOUNTER — Other Ambulatory Visit: Payer: Self-pay | Admitting: Family Medicine

## 2024-10-17 DIAGNOSIS — F411 Generalized anxiety disorder: Secondary | ICD-10-CM

## 2024-10-18 ENCOUNTER — Other Ambulatory Visit: Payer: Self-pay | Admitting: Family Medicine

## 2024-10-19 NOTE — Telephone Encounter (Signed)
 LOV- 02/07/2024 NOV- 10/27/2024 LRF- 07/20/2024   Disp Refills Start End    PARoxetine  (PAXIL ) 20 MG tablet 90 tablet 0 07/20/2024 --   Sig - Route: Take 1 tablet (20 mg total) by mouth daily. - Oral   Sent to pharmacy as: PARoxetine  (PAXIL ) 20 MG tablet   E-Prescribing Status: Receipt confirmed by pharmacy (07/20/2024 11:22 AM EDT)

## 2024-10-20 NOTE — Telephone Encounter (Signed)
 LOV- 02/07/2024 NOV- 10/27/2024 LRF- 02/25/2024 Outpatient Medication Detail   Disp Refills Start End   lisinopril  (ZESTRIL ) 40 MG tablet 90 tablet 1 02/25/2024 --   Sig - Route: Take 1 tablet (40 mg total) by mouth daily. - Oral   Sent to pharmacy as: lisinopril  (ZESTRIL ) 40 MG tablet   E-Prescribing Status: Receipt confirmed by pharmacy (02/25/2024 11:51 AM EDT)

## 2024-10-23 ENCOUNTER — Other Ambulatory Visit: Payer: Self-pay | Admitting: Family Medicine

## 2024-10-27 ENCOUNTER — Encounter: Payer: Self-pay | Admitting: Family Medicine

## 2024-10-27 ENCOUNTER — Ambulatory Visit: Admitting: Family Medicine

## 2024-10-27 VITALS — BP 165/76 | HR 59 | Temp 98.5°F | Ht 64.0 in | Wt 187.2 lb

## 2024-10-27 DIAGNOSIS — E1159 Type 2 diabetes mellitus with other circulatory complications: Secondary | ICD-10-CM | POA: Diagnosis not present

## 2024-10-27 DIAGNOSIS — Z7984 Long term (current) use of oral hypoglycemic drugs: Secondary | ICD-10-CM

## 2024-10-27 DIAGNOSIS — I152 Hypertension secondary to endocrine disorders: Secondary | ICD-10-CM

## 2024-10-27 DIAGNOSIS — N3946 Mixed incontinence: Secondary | ICD-10-CM

## 2024-10-27 DIAGNOSIS — E785 Hyperlipidemia, unspecified: Secondary | ICD-10-CM

## 2024-10-27 DIAGNOSIS — E1142 Type 2 diabetes mellitus with diabetic polyneuropathy: Secondary | ICD-10-CM

## 2024-10-27 DIAGNOSIS — E1169 Type 2 diabetes mellitus with other specified complication: Secondary | ICD-10-CM | POA: Diagnosis not present

## 2024-10-27 DIAGNOSIS — R197 Diarrhea, unspecified: Secondary | ICD-10-CM

## 2024-10-27 DIAGNOSIS — R600 Localized edema: Secondary | ICD-10-CM

## 2024-10-27 DIAGNOSIS — E039 Hypothyroidism, unspecified: Secondary | ICD-10-CM | POA: Diagnosis not present

## 2024-10-27 DIAGNOSIS — Z79899 Other long term (current) drug therapy: Secondary | ICD-10-CM

## 2024-10-27 DIAGNOSIS — R1110 Vomiting, unspecified: Secondary | ICD-10-CM

## 2024-10-27 MED ORDER — TRULICITY 0.75 MG/0.5ML ~~LOC~~ SOAJ
0.7500 mg | SUBCUTANEOUS | 3 refills | Status: AC
Start: 2024-10-27 — End: ?

## 2024-10-27 MED ORDER — CARVEDILOL 25 MG PO TABS: 25.0000 mg | ORAL_TABLET | Freq: Two times a day (BID) | ORAL | 0 refills | Status: DC

## 2024-10-27 MED ORDER — LISINOPRIL 40 MG PO TABS: 40.0000 mg | ORAL_TABLET | Freq: Every day | ORAL | 0 refills | Status: AC

## 2024-10-27 MED ORDER — HYDROCHLOROTHIAZIDE 25 MG PO TABS: 25.0000 mg | ORAL_TABLET | Freq: Every day | ORAL | 0 refills | Status: AC

## 2024-10-27 NOTE — Progress Notes (Signed)
 Established patient visit   Patient: Emily Tapia   DOB: [birth year]   63 y.o. Female  MRN: [id number] Visit Date: 10/27/2024  Today's healthcare provider: LAURAINE LOISE BUOY, DO   Chief Complaint  Patient presents with   Medical Management of Chronic Issues    Patient is here for a follow up on diabetes and hypertension.  Mammogram done 07/10/2024 ordered by Dr. Jacobo.  Flu vaccine- declined   Cervical Cancer Screening- total hysterectomy, been a long time since having one.   Hypertension    Hypertension Patient is here for follow-up of elevated blood pressure. She is not exercising and is trying to adhere to a low-salt diet. Blood pressure is monitored at home states that it hasn't been bad.   Diabetes    Emily Tapia is a 64 y.o. female who presents for follow up of diabetes. Patient denies any symptoms. Reports that she has not been monitoring glucose at home. Last dilated eye exam not one in 2025.SABRA   Subjective    HPI Emily Tapia is a 63 year old female with diabetes and hypertension who presents for follow-up on her chronic conditions.  She struggles with adherence to a low salt diet and does not engage in regular exercise. She takes lisinopril , amlodipine , hydrochlorothiazide , and carvedilol  for blood pressure. She is low on lisinopril  and has issues with refills but denies missing doses of amlodipine  and carvedilol . Home blood pressure readings are often around 150/78, with occasional variations. She experiences occasional shortness of breath at rest but no chest pain.  For diabetes management, she is on glipizide  and metformin . She denies symptoms of hypoglycemia such as lightheadedness, dizziness, or vision changes. She cannot afford Ozempic  due to high copay costs and has not tried Trulicity . She does not regularly check her blood sugars and has not had an eye exam recently.  She experiences frequent diarrhea, occurring daily, and reports  that this has been ongoing since her chemotherapy. She has not seen her gastroenterologist recently but was previously referred due to these symptoms. She takes antidiarrheal medication when going out and denies blood in her stool.  She reports episodes of vomiting foamy material, which occur randomly and are sometimes severe, associated with nausea. No specific trigger has been identified.  She also experiences lightheadedness and vision changes with hiccups.  She has urinary incontinence, experiencing leakage with urgency and stress, and wears pads constantly. She has a history of smoking but quit 21 years ago.       Medications: Outpatient Medications Prior to Visit  Medication Sig   Ascorbic Acid (VITAMIN C PO) Take 1 tablet by mouth daily.   aspirin 81 MG EC tablet Take 81 mg by mouth at bedtime.   Cholecalciferol (VITAMIN D3) 1000 UNITS CAPS Take 1,000 Units by mouth at bedtime.   glucose blood (CONTOUR NEXT TEST) test strip Test fasting sugar each morning. Recheck if having hypoglycemic symptoms.   letrozole  (FEMARA ) 2.5 MG tablet Take 1 tablet by mouth once daily   levothyroxine  (SYNTHROID ) 75 MCG tablet TAKE 1 TABLET BY MOUTH ONCE DAILY BEFORE BREAKFAST .   metFORMIN  (GLUCOPHAGE ) 1000 MG tablet Take 1 tablet (1,000 mg total) by mouth 2 (two) times daily with a meal.   Multiple Vitamin (MULTIVITAMIN) tablet Take 1 tablet by mouth every morning.   PARoxetine  (PAXIL ) 20 MG tablet Take 1 tablet by mouth once daily   zinc gluconate 50 MG tablet Take 50 mg by mouth daily.   [  DISCONTINUED] amLODipine  (NORVASC ) 5 MG tablet Take 1 tablet by mouth once daily   [DISCONTINUED] carvedilol  (COREG ) 25 MG tablet TAKE 1 TABLET BY MOUTH TWICE DAILY. OFFICE VISIT NEEDED FOR FURTHER REFILLS.   [DISCONTINUED] furosemide  (LASIX ) 40 MG tablet TAKE 1 TABLET BY MOUTH ONCE DAILY AS NEEDED FOR  FLUID  OR  EDEMA   [DISCONTINUED] glipiZIDE  (GLUCOTROL ) 5 MG tablet TAKE 1 TABLET BY MOUTH TWICE DAILY BEFORE A  MEAL   [DISCONTINUED] hydrochlorothiazide  (HYDRODIURIL ) 25 MG tablet Take 1 tablet (25 mg total) by mouth daily. Need appointment   [DISCONTINUED] lisinopril  (ZESTRIL ) 40 MG tablet TAKE 1 TABLET BY MOUTH ONCE DAILY . APPOINTMENT REQUIRED FOR FUTURE REFILLS   [DISCONTINUED] albuterol  (VENTOLIN  HFA) 108 (90 Base) MCG/ACT inhaler Inhale 2 puffs into the lungs every 6 (six) hours as needed for wheezing or shortness of breath.   [DISCONTINUED] azithromycin  (ZITHROMAX ) 250 MG tablet Take 1 tablet (250 mg total) by mouth daily. Take first 2 tablets together, then 1 every day until finished.   [DISCONTINUED] benzonatate  (TESSALON ) 100 MG capsule Take 1 capsule (100 mg total) by mouth every 8 (eight) hours.   [DISCONTINUED] predniSONE  (STERAPRED UNI-PAK 21 TAB) 10 MG (21) TBPK tablet Take by mouth daily. Take 6 tabs by mouth daily  for 1 days, then 5 tabs for 1 days, then 4 tabs for 1 days, then 3 tabs for 1 days, 2 tabs for 1 days, then 1 tab by mouth daily for 1 days   [DISCONTINUED] promethazine -dextromethorphan (PROMETHAZINE -DM) 6.25-15 MG/5ML syrup Take 5 mLs by mouth at bedtime as needed.   [DISCONTINUED] Semaglutide ,0.25 or 0.5MG /DOS, (OZEMPIC , 0.25 OR 0.5 MG/DOSE,) 2 MG/3ML SOPN Inject 0.25 mg into the skin once a week. (Patient not taking: Reported on 07/16/2024)   No facility-administered medications prior to visit.        Objective    BP (!) 165/76 (BP Location: Right Arm, Cuff Size: Normal)   Pulse (!) 59   Temp 98.5 F (36.9 C) (Oral)   Ht 5' 4 (1.626 m)   Wt 187 lb 3.2 oz (84.9 kg)   SpO2 96%   BMI 32.13 kg/m     Physical Exam Vitals and nursing note reviewed.  Constitutional:      General: She is not in acute distress.    Appearance: Normal appearance.  HENT:     Head: Normocephalic and atraumatic.  Eyes:     General: No scleral icterus.    Conjunctiva/sclera: Conjunctivae normal.  Cardiovascular:     Rate and Rhythm: Normal rate.  Pulmonary:     Effort: Pulmonary  effort is normal.  Neurological:     Mental Status: She is alert and oriented to person, place, and time. Mental status is at baseline.  Psychiatric:        Mood and Affect: Mood normal.        Behavior: Behavior normal.      Results for orders placed or performed in visit on 10/27/24  B12  Result Value Ref Range   Vitamin B-12 479 232 - 1,245 pg/mL  TSH  Result Value Ref Range   TSH 5.070 (H) 0.450 - 4.500 uIU/mL  HgB A1c  Result Value Ref Range   Hgb A1c MFr Bld 8.4 (H) 4.8 - 5.6 %   Est. average glucose Bld gHb Est-mCnc 194 mg/dL  Lipid Panel With LDL/HDL Ratio  Result Value Ref Range   Cholesterol, Total 185 100 - 199 mg/dL   Triglycerides 789 (H) 0 - 149 mg/dL  HDL 37 (L) >39 mg/dL   VLDL Cholesterol Cal 37 5 - 40 mg/dL   LDL Chol Calc (NIH) 888 (H) 0 - 99 mg/dL   LDL/HDL Ratio 3.0 0.0 - 3.2 ratio  Comprehensive metabolic panel with GFR  Result Value Ref Range   Glucose 140 (H) 70 - 99 mg/dL   BUN 15 8 - 27 mg/dL   Creatinine, Ser 9.17 0.57 - 1.00 mg/dL   eGFR 80 >40 fO/fpw/8.26   BUN/Creatinine Ratio 18 12 - 28   Sodium 141 134 - 144 mmol/L   Potassium 4.0 3.5 - 5.2 mmol/L   Chloride 95 (L) 96 - 106 mmol/L   CO2 29 20 - 29 mmol/L   Calcium  10.2 8.7 - 10.3 mg/dL   Total Protein 7.2 6.0 - 8.5 g/dL   Albumin 4.9 3.9 - 4.9 g/dL   Globulin, Total 2.3 1.5 - 4.5 g/dL   Bilirubin Total 0.3 0.0 - 1.2 mg/dL   Alkaline Phosphatase 67 49 - 135 IU/L   AST 20 0 - 40 IU/L   ALT 24 0 - 32 IU/L  Urine Microalbumin w/creat. ratio  Result Value Ref Range   Creatinine, Urine 43.0 Not Estab. mg/dL   Microalbumin, Urine 744.5 Not Estab. ug/mL   Microalb/Creat Ratio 594 (H) 0 - 29 mg/g creat    Assessment & Plan    Diabetic peripheral neuropathy associated with type 2 diabetes mellitus (HCC) -     Vitamin B12 -     Hemoglobin A1c -     Microalbumin / creatinine urine ratio -     Trulicity ; Inject 0.75 mg into the skin once a week.  Dispense: 2 mL; Refill: 3  Hypertension  associated with diabetes (HCC) -     Comprehensive metabolic panel with GFR -     Lisinopril ; Take 1 tablet (40 mg total) by mouth daily.  Dispense: 90 tablet; Refill: 0 -     hydroCHLOROthiazide ; Take 1 tablet (25 mg total) by mouth daily. Need appointment  Dispense: 90 tablet; Refill: 0 -     Carvedilol ; Take 1 tablet (25 mg total) by mouth 2 (two) times daily with a meal.  Dispense: 60 tablet; Refill: 0  Hyperlipidemia associated with type 2 diabetes mellitus (HCC) -     Lipid Panel With LDL/HDL Ratio  Hypothyroidism, unspecified type -     TSH  High risk medication use -     Vitamin B12  Vomiting and diarrhea -     Ambulatory referral to Gastroenterology  Mixed stress and urge urinary incontinence  Bilateral lower extremity edema      Diabetic peripheral neuropathy associated with type 2 diabetes mellitus Diabetes managed with glipizide  and metformin . No hypoglycemia. Ozempic  unaffordable; Trulicity  considered. - Sent prescription for Trulicity . - Ordered fasting blood sugar, A1c, cholesterol, metabolic panel, and uACR.  Hypertension associated with diabetes Chronic, elevated today.  Blood pressure elevated despite reported adherence to lisinopril , hydrochlorothiazide , carvedilol , and amlodipine . Previous control noted. Consider increasing amlodipine  if adherence confirmed. - Rechecked blood pressure. - Provided printed medication list for adherence check.  Patient to advise clinic if there are any inconsistencies with her home medications when she compares them - Will consider increasing amlodipine  if adherence is confirmed. - Scheduled follow-up in 4-6 weeks to recheck blood pressure.  Hyperlipidemia associated with type 2 diabetes mellitus Chronic, not currently on a statin medication.  Previously on pravastatin ; unclear why this medication was stopped.  Will recheck her lipid panel today and restart antihyperlipidemic if  appropriate.  Hypothyroidism, unspecified  type Managed with levothyroxine . - Continue current levothyroxine  regimen.  Mixed stress and urge urinary incontinence Experiencing urgency and stress incontinence. Uses pads regularly.  Encouraged timed voiding.  Continue current management.  Diarrhea and vomiting Chronic diarrhea and occasional vomiting, possibly related to past chemotherapy. No recent gastroenterologist evaluation. - Referred to gastroenterologist for evaluation of chronic diarrhea and vomiting.  Cirrhosis of liver Cirrhosis with occasional vomiting and nausea. Symptoms may relate to cirrhosis or other gastrointestinal issues. - Referred to gastroenterologist for further evaluation.  Bilateral lower extremity edema Reports daily leg swelling. Uses Lasix  as needed but prefers to avoid due to bowel side effects. - Continue Lasix  as needed.   Aortic stenosis Occasional shortness of breath at rest. Symptoms not attributed to aortic stenosis due to atypical presentation.  Continue to monitor.     Return in about 6 weeks (around 12/08/2024) for HTN, Chronic f/u, DM.      I discussed the assessment and treatment plan with the patient  The patient was provided an opportunity to ask questions and all were answered. The patient agreed with the plan and demonstrated an understanding of the instructions.   The patient was advised to call back or seek an in-person evaluation if the symptoms worsen or if the condition fails to improve as anticipated.    LAURAINE LOISE BUOY, DO  [facility] [phone] (phone) [phone] (fax)  [facility]

## 2024-10-28 ENCOUNTER — Encounter: Payer: Self-pay | Admitting: Oncology

## 2024-10-28 LAB — LIPID PANEL WITH LDL/HDL RATIO
Cholesterol, Total: 185 mg/dL (ref 100–199)
HDL: 37 mg/dL — ABNORMAL LOW (ref >39)
LDL Chol Calc (NIH): 111 mg/dL — ABNORMAL HIGH (ref 0–99)
LDL/HDL Ratio: 3 ratio (ref 0.0–3.2)
Triglycerides: 210 mg/dL — ABNORMAL HIGH (ref 0–149)
VLDL Cholesterol Cal: 37 mg/dL (ref 5–40)

## 2024-10-28 LAB — COMPREHENSIVE METABOLIC PANEL WITH GFR
ALT: 24 IU/L (ref 0–32)
AST: 20 IU/L (ref 0–40)
Albumin: 4.9 g/dL (ref 3.9–4.9)
Alkaline Phosphatase: 67 IU/L (ref 49–135)
BUN/Creatinine Ratio: 18 (ref 12–28)
BUN: 15 mg/dL (ref 8–27)
Bilirubin Total: 0.3 mg/dL (ref 0.0–1.2)
CO2: 29 mmol/L (ref 20–29)
Calcium: 10.2 mg/dL (ref 8.7–10.3)
Chloride: 95 mmol/L — ABNORMAL LOW (ref 96–106)
Creatinine, Ser: 0.82 mg/dL (ref 0.57–1.00)
Globulin, Total: 2.3 g/dL (ref 1.5–4.5)
Glucose: 140 mg/dL — ABNORMAL HIGH (ref 70–99)
Potassium: 4 mmol/L (ref 3.5–5.2)
Sodium: 141 mmol/L (ref 134–144)
Total Protein: 7.2 g/dL (ref 6.0–8.5)
eGFR: 80 mL/min/1.73 (ref >59)

## 2024-10-28 LAB — TSH: TSH: 5.07 u[IU]/mL — ABNORMAL HIGH (ref 0.450–4.500)

## 2024-10-28 LAB — MICROALBUMIN / CREATININE URINE RATIO
Creatinine, Urine: 43 mg/dL
Microalb/Creat Ratio: 594 mg/g{creat} — ABNORMAL HIGH (ref 0–29)
Microalbumin, Urine: 255.4 ug/mL

## 2024-10-28 LAB — VITAMIN B12: Vitamin B-12: 479 pg/mL (ref 232–1245)

## 2024-10-28 LAB — HEMOGLOBIN A1C
Est. average glucose Bld gHb Est-mCnc: 194 mg/dL
Hgb A1c MFr Bld: 8.4 % — ABNORMAL HIGH (ref 4.8–5.6)

## 2024-10-29 ENCOUNTER — Telehealth: Payer: Self-pay | Admitting: Pharmacy Technician

## 2024-10-29 ENCOUNTER — Encounter: Payer: Self-pay | Admitting: Oncology

## 2024-10-29 ENCOUNTER — Other Ambulatory Visit (HOSPITAL_COMMUNITY): Payer: Self-pay

## 2024-10-29 NOTE — Telephone Encounter (Signed)
 Pharmacy Patient Advocate Encounter   Received notification from CoverMyMeds that prior authorization for Trulicity  0.75mg  is required/requested.   Insurance verification completed.   The patient is insured through [employer]   Per test claim: PA required; PA submitted to above mentioned insurance via Latent Key/confirmation #/EOC [id number] Status is pending

## 2024-10-30 ENCOUNTER — Other Ambulatory Visit (HOSPITAL_COMMUNITY): Payer: Self-pay

## 2024-11-02 ENCOUNTER — Other Ambulatory Visit (HOSPITAL_COMMUNITY): Payer: Self-pay

## 2024-11-02 NOTE — Telephone Encounter (Signed)
 Pharmacy Patient Advocate Encounter  Received notification from [facility] MEDICAID that Prior Authorization for Trulicity  0.75mg  has been APPROVED from 10/29/24 to 10/29/25. Ran test claim, Copay is $952.94. This test claim was processed through [facility]- copay amounts may vary at other pharmacies due to pharmacy/plan contracts, or as the patient moves through the different stages of their insurance plan.   PA #/Case ID/Reference #: [id number]   I realize this isn't really affordable. Not sure if she could get a different more affordable medication approved, or if anything similar would be more affordable.

## 2024-11-08 ENCOUNTER — Other Ambulatory Visit: Payer: Self-pay | Admitting: Family Medicine

## 2024-11-08 DIAGNOSIS — R600 Localized edema: Secondary | ICD-10-CM

## 2024-11-08 DIAGNOSIS — I152 Hypertension secondary to endocrine disorders: Secondary | ICD-10-CM

## 2024-11-10 ENCOUNTER — Encounter: Payer: Self-pay | Admitting: Family Medicine

## 2024-11-10 ENCOUNTER — Ambulatory Visit: Payer: Self-pay | Admitting: Family Medicine

## 2024-11-10 DIAGNOSIS — E1169 Type 2 diabetes mellitus with other specified complication: Secondary | ICD-10-CM

## 2024-11-10 DIAGNOSIS — E039 Hypothyroidism, unspecified: Secondary | ICD-10-CM

## 2024-11-10 DIAGNOSIS — N182 Chronic kidney disease, stage 2 (mild): Secondary | ICD-10-CM

## 2024-11-10 MED ORDER — LEVOTHYROXINE SODIUM 88 MCG PO TABS
88.0000 ug | ORAL_TABLET | Freq: Every day | ORAL | 3 refills | Status: AC
Start: 2024-11-10 — End: ?

## 2024-11-10 MED ORDER — ROSUVASTATIN CALCIUM 5 MG PO TABS: 5.0000 mg | ORAL_TABLET | Freq: Every day | ORAL | 3 refills | Status: AC

## 2024-11-16 ENCOUNTER — Other Ambulatory Visit: Payer: Self-pay

## 2024-11-16 ENCOUNTER — Telehealth: Payer: Self-pay | Admitting: Family Medicine

## 2024-11-16 DIAGNOSIS — E1159 Type 2 diabetes mellitus with other circulatory complications: Secondary | ICD-10-CM

## 2024-11-16 NOTE — Telephone Encounter (Signed)
 Converted to rx request

## 2024-11-16 NOTE — Telephone Encounter (Signed)
[facility] faxed refill request for the following medications:   amLODipine (NORVASC) 5 MG tablet   Please advise.  

## 2024-11-19 NOTE — Telephone Encounter (Signed)
 2nd request for Amlodipine  5 mg. #30.  Patient was just here on 10/27/24.

## 2024-11-23 ENCOUNTER — Other Ambulatory Visit: Payer: Self-pay | Admitting: Family Medicine

## 2024-11-23 DIAGNOSIS — I152 Hypertension secondary to endocrine disorders: Secondary | ICD-10-CM

## 2024-12-06 ENCOUNTER — Other Ambulatory Visit: Payer: Self-pay | Admitting: Family Medicine

## 2024-12-06 DIAGNOSIS — I152 Hypertension secondary to endocrine disorders: Secondary | ICD-10-CM

## 2024-12-07 ENCOUNTER — Other Ambulatory Visit: Payer: Self-pay

## 2024-12-08 ENCOUNTER — Telehealth: Payer: Self-pay | Admitting: Family Medicine

## 2024-12-08 NOTE — Telephone Encounter (Signed)
 Refill Request   Pharmacy: [facility]  Medication:  Quantity (if provided):  Please send in refill request.

## 2024-12-15 ENCOUNTER — Ambulatory Visit: Admitting: Family Medicine

## 2024-12-27 ENCOUNTER — Other Ambulatory Visit: Payer: Self-pay | Admitting: Family Medicine

## 2024-12-27 DIAGNOSIS — I152 Hypertension secondary to endocrine disorders: Secondary | ICD-10-CM

## 2025-01-04 ENCOUNTER — Telehealth: Payer: Self-pay | Admitting: Family Medicine

## 2025-01-04 NOTE — Telephone Encounter (Signed)
[facility] faxed refill request for the following medications:   carvedilol (COREG) 25 MG tablet     Please advise.

## 2025-01-09 ENCOUNTER — Other Ambulatory Visit: Payer: Self-pay | Admitting: Oncology

## 2025-01-17 ENCOUNTER — Other Ambulatory Visit: Payer: Self-pay | Admitting: Family Medicine

## 2025-01-17 DIAGNOSIS — F411 Generalized anxiety disorder: Secondary | ICD-10-CM

## 2025-01-20 ENCOUNTER — Encounter: Payer: Self-pay | Admitting: Oncology

## 2025-01-28 ENCOUNTER — Telehealth: Admitting: Oncology
